# Patient Record
Sex: Female | Born: 1965 | Race: White | Hispanic: No | Marital: Single | Smoking: Former smoker
Health system: Southern US, Community
[De-identification: ages and names within clinical notes are randomized; demographics above are authoritative.]

## PROBLEM LIST (undated history)

## (undated) DIAGNOSIS — G2401 Drug induced subacute dyskinesia: Secondary | ICD-10-CM

## (undated) DIAGNOSIS — R569 Unspecified convulsions: Secondary | ICD-10-CM

## (undated) DIAGNOSIS — F209 Schizophrenia, unspecified: Secondary | ICD-10-CM

## (undated) DIAGNOSIS — M810 Age-related osteoporosis without current pathological fracture: Secondary | ICD-10-CM

## (undated) DIAGNOSIS — F79 Unspecified intellectual disabilities: Secondary | ICD-10-CM

## (undated) DIAGNOSIS — N189 Chronic kidney disease, unspecified: Secondary | ICD-10-CM

## (undated) DIAGNOSIS — W19XXXA Unspecified fall, initial encounter: Secondary | ICD-10-CM

## (undated) DIAGNOSIS — R011 Cardiac murmur, unspecified: Secondary | ICD-10-CM

## (undated) DIAGNOSIS — D649 Anemia, unspecified: Secondary | ICD-10-CM

## (undated) HISTORY — DX: Anemia, unspecified: D64.9

## (undated) HISTORY — DX: Chronic kidney disease, unspecified: N18.9

## (undated) HISTORY — DX: Age-related osteoporosis without current pathological fracture: M81.0

---

## 1997-09-14 ENCOUNTER — Other Ambulatory Visit: Admission: RE | Admit: 1997-09-14 | Discharge: 1997-09-14 | Payer: Self-pay | Admitting: Obstetrics and Gynecology

## 1997-10-27 ENCOUNTER — Emergency Department (HOSPITAL_COMMUNITY): Admission: EM | Admit: 1997-10-27 | Discharge: 1997-10-27 | Payer: Self-pay | Admitting: Emergency Medicine

## 1997-11-01 ENCOUNTER — Ambulatory Visit (HOSPITAL_COMMUNITY): Admission: RE | Admit: 1997-11-01 | Discharge: 1997-11-01 | Payer: Self-pay | Admitting: Obstetrics and Gynecology

## 1997-12-14 ENCOUNTER — Other Ambulatory Visit: Admission: RE | Admit: 1997-12-14 | Discharge: 1997-12-14 | Payer: Self-pay | Admitting: Obstetrics and Gynecology

## 1998-03-29 ENCOUNTER — Other Ambulatory Visit: Admission: RE | Admit: 1998-03-29 | Discharge: 1998-03-29 | Payer: Self-pay | Admitting: Obstetrics and Gynecology

## 1998-05-17 ENCOUNTER — Emergency Department (HOSPITAL_COMMUNITY): Admission: EM | Admit: 1998-05-17 | Discharge: 1998-05-17 | Payer: Self-pay | Admitting: *Deleted

## 1998-07-17 ENCOUNTER — Other Ambulatory Visit: Admission: RE | Admit: 1998-07-17 | Discharge: 1998-07-17 | Payer: Self-pay | Admitting: Obstetrics and Gynecology

## 1998-10-15 ENCOUNTER — Other Ambulatory Visit: Admission: RE | Admit: 1998-10-15 | Discharge: 1998-10-15 | Payer: Self-pay | Admitting: Obstetrics and Gynecology

## 1999-05-14 ENCOUNTER — Other Ambulatory Visit: Admission: RE | Admit: 1999-05-14 | Discharge: 1999-05-14 | Payer: Self-pay | Admitting: Internal Medicine

## 2000-06-17 ENCOUNTER — Other Ambulatory Visit: Admission: RE | Admit: 2000-06-17 | Discharge: 2000-06-17 | Payer: Self-pay | Admitting: Family Medicine

## 2000-06-28 ENCOUNTER — Ambulatory Visit (HOSPITAL_COMMUNITY): Admission: RE | Admit: 2000-06-28 | Discharge: 2000-06-28 | Payer: Self-pay | Admitting: *Deleted

## 2001-09-15 ENCOUNTER — Encounter: Admission: RE | Admit: 2001-09-15 | Discharge: 2001-09-15 | Payer: Self-pay | Admitting: Family Medicine

## 2001-09-15 ENCOUNTER — Encounter: Payer: Self-pay | Admitting: Family Medicine

## 2002-07-14 ENCOUNTER — Encounter: Payer: Self-pay | Admitting: Family Medicine

## 2002-07-14 ENCOUNTER — Encounter: Admission: RE | Admit: 2002-07-14 | Discharge: 2002-07-14 | Payer: Self-pay | Admitting: Family Medicine

## 2002-10-05 ENCOUNTER — Encounter: Admission: RE | Admit: 2002-10-05 | Discharge: 2002-10-05 | Payer: Self-pay | Admitting: Family Medicine

## 2002-10-05 ENCOUNTER — Encounter: Payer: Self-pay | Admitting: Family Medicine

## 2002-11-28 ENCOUNTER — Emergency Department (HOSPITAL_COMMUNITY): Admission: EM | Admit: 2002-11-28 | Discharge: 2002-11-28 | Payer: Self-pay | Admitting: Emergency Medicine

## 2002-12-06 ENCOUNTER — Ambulatory Visit (HOSPITAL_COMMUNITY): Admission: RE | Admit: 2002-12-06 | Discharge: 2002-12-06 | Payer: Self-pay | Admitting: Oncology

## 2002-12-06 ENCOUNTER — Encounter: Payer: Self-pay | Admitting: Oncology

## 2002-12-29 ENCOUNTER — Ambulatory Visit (HOSPITAL_COMMUNITY): Admission: RE | Admit: 2002-12-29 | Discharge: 2002-12-29 | Payer: Self-pay | Admitting: Oncology

## 2002-12-29 ENCOUNTER — Encounter: Payer: Self-pay | Admitting: Oncology

## 2003-01-04 ENCOUNTER — Encounter (INDEPENDENT_AMBULATORY_CARE_PROVIDER_SITE_OTHER): Payer: Self-pay | Admitting: *Deleted

## 2003-01-04 ENCOUNTER — Ambulatory Visit (HOSPITAL_COMMUNITY): Admission: RE | Admit: 2003-01-04 | Discharge: 2003-01-04 | Payer: Self-pay | Admitting: Oncology

## 2003-03-05 ENCOUNTER — Ambulatory Visit (HOSPITAL_COMMUNITY): Admission: RE | Admit: 2003-03-05 | Discharge: 2003-03-05 | Payer: Self-pay | Admitting: Family Medicine

## 2003-03-05 ENCOUNTER — Encounter: Payer: Self-pay | Admitting: Family Medicine

## 2003-10-12 ENCOUNTER — Encounter: Admission: RE | Admit: 2003-10-12 | Discharge: 2003-10-12 | Payer: Self-pay | Admitting: Family Medicine

## 2004-11-05 ENCOUNTER — Emergency Department (HOSPITAL_COMMUNITY): Admission: EM | Admit: 2004-11-05 | Discharge: 2004-11-05 | Payer: Self-pay | Admitting: Emergency Medicine

## 2006-02-16 ENCOUNTER — Emergency Department (HOSPITAL_COMMUNITY): Admission: EM | Admit: 2006-02-16 | Discharge: 2006-02-16 | Payer: Self-pay | Admitting: Emergency Medicine

## 2006-03-16 ENCOUNTER — Emergency Department (HOSPITAL_COMMUNITY): Admission: EM | Admit: 2006-03-16 | Discharge: 2006-03-16 | Payer: Self-pay | Admitting: Emergency Medicine

## 2007-08-15 ENCOUNTER — Emergency Department (HOSPITAL_COMMUNITY): Admission: EM | Admit: 2007-08-15 | Discharge: 2007-08-15 | Payer: Self-pay | Admitting: Emergency Medicine

## 2008-02-14 ENCOUNTER — Emergency Department (HOSPITAL_COMMUNITY): Admission: EM | Admit: 2008-02-14 | Discharge: 2008-02-14 | Payer: Self-pay | Admitting: *Deleted

## 2008-02-14 ENCOUNTER — Emergency Department (HOSPITAL_COMMUNITY): Admission: EM | Admit: 2008-02-14 | Discharge: 2008-02-14 | Payer: Self-pay | Admitting: Emergency Medicine

## 2008-12-26 ENCOUNTER — Ambulatory Visit: Payer: Self-pay | Admitting: Oncology

## 2008-12-27 LAB — COMPREHENSIVE METABOLIC PANEL
AST: 20 U/L (ref 0–37)
Albumin: 3.4 g/dL — ABNORMAL LOW (ref 3.5–5.2)
BUN: 9 mg/dL (ref 6–23)
Calcium: 9.2 mg/dL (ref 8.4–10.5)
Chloride: 107 mEq/L (ref 96–112)
Glucose, Bld: 84 mg/dL (ref 70–99)
Potassium: 4.9 mEq/L (ref 3.5–5.3)
Sodium: 142 mEq/L (ref 135–145)
Total Protein: 5.4 g/dL — ABNORMAL LOW (ref 6.0–8.3)

## 2008-12-27 LAB — CBC WITH DIFFERENTIAL/PLATELET
Basophils Absolute: 0 10*3/uL (ref 0.0–0.1)
HCT: 32.5 % — ABNORMAL LOW (ref 34.8–46.6)
HGB: 10.8 g/dL — ABNORMAL LOW (ref 11.6–15.9)
MONO#: 0.8 10*3/uL (ref 0.1–0.9)
NEUT#: 3.1 10*3/uL (ref 1.5–6.5)
NEUT%: 53.5 % (ref 38.4–76.8)
RDW: 15.6 % — ABNORMAL HIGH (ref 11.2–14.5)
WBC: 5.7 10*3/uL (ref 3.9–10.3)
lymph#: 1.8 10*3/uL (ref 0.9–3.3)

## 2008-12-27 LAB — CHCC SMEAR

## 2008-12-28 LAB — ANA: Anti Nuclear Antibody(ANA): NEGATIVE

## 2009-04-23 ENCOUNTER — Ambulatory Visit: Payer: Self-pay | Admitting: Oncology

## 2009-04-25 LAB — CBC WITH DIFFERENTIAL/PLATELET
BASO%: 0.4 % (ref 0.0–2.0)
Basophils Absolute: 0 10*3/uL (ref 0.0–0.1)
Eosinophils Absolute: 0.1 10*3/uL (ref 0.0–0.5)
HCT: 36.5 % (ref 34.8–46.6)
HGB: 11.9 g/dL (ref 11.6–15.9)
MCHC: 32.6 g/dL (ref 31.5–36.0)
MCV: 101.1 fL — ABNORMAL HIGH (ref 79.5–101.0)
MONO#: 0.6 10*3/uL (ref 0.1–0.9)
NEUT#: 3.7 10*3/uL (ref 1.5–6.5)
NEUT%: 51.3 % (ref 38.4–76.8)
WBC: 7.1 10*3/uL (ref 3.9–10.3)

## 2009-04-29 LAB — SPEP & IFE WITH QIG
Albumin ELP: 56.7 % (ref 55.8–66.1)
Alpha-1-Globulin: 5.7 % — ABNORMAL HIGH (ref 2.9–4.9)
Alpha-2-Globulin: 11.9 % — ABNORMAL HIGH (ref 7.1–11.8)
Total Protein, Serum Electrophoresis: 6.3 g/dL (ref 6.0–8.3)

## 2009-04-29 LAB — COMPREHENSIVE METABOLIC PANEL
AST: 22 U/L (ref 0–37)
BUN: 24 mg/dL — ABNORMAL HIGH (ref 6–23)
Calcium: 8.9 mg/dL (ref 8.4–10.5)
Chloride: 104 mEq/L (ref 96–112)
Creatinine, Ser: 1.12 mg/dL (ref 0.40–1.20)
Glucose, Bld: 81 mg/dL (ref 70–99)

## 2009-04-29 LAB — IRON AND TIBC
%SAT: 29 % (ref 20–55)
TIBC: 313 ug/dL (ref 250–470)
UIBC: 221 ug/dL

## 2009-04-29 LAB — VITAMIN B12: Vitamin B-12: 616 pg/mL (ref 211–911)

## 2009-10-22 ENCOUNTER — Ambulatory Visit: Payer: Self-pay | Admitting: Oncology

## 2010-10-10 NOTE — Op Note (Signed)
   NAME:  Diamond Bowman, Diamond Bowman                         ACCOUNT NO.:  [id number]   MEDICAL RECORD NO.:  [id number]                   PATIENT TYPE:  AMB   LOCATION:  OMED                                 FACILITY:  [facility]   PHYSICIAN:  Pierce Crane, M.D.                   DATE OF BIRTH:  05-17-1966   DATE OF PROCEDURE:  01/04/2003  DATE OF DISCHARGE:                                 OPERATIVE REPORT   PROCEDURE PERFORMED:  Bone marrow biopsy and aspirate.   PROCEDURE:  Ms. Massing was brought to the outpatient day area.  She was  placed in the prone position.  She was given a total of 7 mg of Versed.  She  had 2% Xylocaine used for local anesthesia.  A bone marrow aspirate and  biopsy was performed from the left anterior-superior iliac spine.  The  patient tolerated the procedure well.  The patient will be monitored in the  outpatient clinic and discharged when stable.  The vital signs were  monitored throughout.                                               Pierce Crane, M.D.    PR/MEDQ  D:  01/04/2003  T:  01/04/2003  Job:  [id number]

## 2011-02-23 LAB — CBC
HCT: 33.2 — ABNORMAL LOW
HCT: 36.3
Hemoglobin: 12.3
MCHC: 33.8
MCV: 96.9
MCV: 97.3
Platelets: 70 — ABNORMAL LOW
RBC: 3.73 — ABNORMAL LOW
RDW: 13.7

## 2011-02-23 LAB — URINALYSIS, ROUTINE W REFLEX MICROSCOPIC
Bilirubin Urine: NEGATIVE
Hgb urine dipstick: NEGATIVE
Ketones, ur: NEGATIVE
Nitrite: NEGATIVE
Specific Gravity, Urine: 1.025
pH: 6.5

## 2011-02-23 LAB — DIFFERENTIAL
Basophils Absolute: 0
Basophils Relative: 2 — ABNORMAL HIGH
Lymphocytes Relative: 44
Lymphs Abs: 1.7
Monocytes Absolute: 0.3
Monocytes Absolute: 0.5
Monocytes Relative: 12
Monocytes Relative: 8
Neutro Abs: 1.7
Neutro Abs: 2.4

## 2011-02-23 LAB — URINE MICROSCOPIC-ADD ON

## 2011-02-23 LAB — COMPREHENSIVE METABOLIC PANEL
Albumin: 2.7 — ABNORMAL LOW
BUN: 17
Calcium: 8.4
Creatinine, Ser: 0.9
GFR calc Af Amer: >60
Total Protein: 4.9 — ABNORMAL LOW

## 2011-02-23 LAB — RAPID URINE DRUG SCREEN, HOSP PERFORMED
Amphetamines: NOT DETECTED
Barbiturates: NOT DETECTED
Cocaine: NOT DETECTED
Opiates: NOT DETECTED
Tetrahydrocannabinol: NOT DETECTED

## 2012-05-14 ENCOUNTER — Observation Stay (HOSPITAL_COMMUNITY)
Admission: EM | Admit: 2012-05-14 | Discharge: 2012-05-15 | Disposition: A | Discharge status: Home / Self Care | Payer: Medicaid Other | Attending: Family Medicine | Admitting: Family Medicine

## 2012-05-14 ENCOUNTER — Emergency Department (HOSPITAL_COMMUNITY): Payer: Medicaid Other

## 2012-05-14 ENCOUNTER — Encounter (HOSPITAL_COMMUNITY): Payer: Self-pay | Admitting: Nurse Practitioner

## 2012-05-14 DIAGNOSIS — F3181 Bipolar II disorder: Secondary | ICD-10-CM

## 2012-05-14 DIAGNOSIS — W19XXXA Unspecified fall, initial encounter: Secondary | ICD-10-CM

## 2012-05-14 DIAGNOSIS — T65891A Toxic effect of other specified substances, accidental (unintentional), initial encounter: Secondary | ICD-10-CM

## 2012-05-14 DIAGNOSIS — F3189 Other bipolar disorder: Secondary | ICD-10-CM | POA: Insufficient documentation

## 2012-05-14 DIAGNOSIS — R6250 Unspecified lack of expected normal physiological development in childhood: Secondary | ICD-10-CM | POA: Insufficient documentation

## 2012-05-14 DIAGNOSIS — F79 Unspecified intellectual disabilities: Secondary | ICD-10-CM

## 2012-05-14 DIAGNOSIS — R569 Unspecified convulsions: Principal | ICD-10-CM

## 2012-05-14 DIAGNOSIS — E039 Hypothyroidism, unspecified: Secondary | ICD-10-CM

## 2012-05-14 DIAGNOSIS — G40909 Epilepsy, unspecified, not intractable, without status epilepticus: Secondary | ICD-10-CM

## 2012-05-14 DIAGNOSIS — Z79899 Other long term (current) drug therapy: Secondary | ICD-10-CM | POA: Insufficient documentation

## 2012-05-14 DIAGNOSIS — N183 Chronic kidney disease, stage 3 unspecified: Secondary | ICD-10-CM | POA: Insufficient documentation

## 2012-05-14 DIAGNOSIS — T56894A Toxic effect of other metals, undetermined, initial encounter: Secondary | ICD-10-CM

## 2012-05-14 DIAGNOSIS — M810 Age-related osteoporosis without current pathological fracture: Secondary | ICD-10-CM

## 2012-05-14 DIAGNOSIS — T56891A Toxic effect of other metals, accidental (unintentional), initial encounter: Secondary | ICD-10-CM

## 2012-05-14 DIAGNOSIS — Z9181 History of falling: Secondary | ICD-10-CM | POA: Insufficient documentation

## 2012-05-14 HISTORY — DX: Unspecified intellectual disabilities: F79

## 2012-05-14 HISTORY — DX: Hypothyroidism, unspecified: E03.9

## 2012-05-14 HISTORY — DX: Unspecified fall, initial encounter: W19.XXXA

## 2012-05-14 HISTORY — DX: Schizophrenia, unspecified: F20.9

## 2012-05-14 HISTORY — DX: Bipolar II disorder: F31.81

## 2012-05-14 LAB — CBC WITH DIFFERENTIAL/PLATELET
Basophils Relative: 0 % (ref 0–1)
Eosinophils Absolute: 0.1 10*3/uL (ref 0.0–0.7)
HCT: 32.3 % — ABNORMAL LOW (ref 36.0–46.0)
Hemoglobin: 10.5 g/dL — ABNORMAL LOW (ref 12.0–15.0)
MCH: 33.1 pg (ref 26.0–34.0)
MCHC: 32.5 g/dL (ref 30.0–36.0)
Monocytes Absolute: 0.8 10*3/uL (ref 0.1–1.0)
Monocytes Relative: 8 % (ref 3–12)
Neutrophils Relative %: 74 % (ref 43–77)

## 2012-05-14 LAB — BASIC METABOLIC PANEL
BUN: 19 mg/dL (ref 6–23)
Creatinine, Ser: 1.29 mg/dL — ABNORMAL HIGH (ref 0.50–1.10)
GFR calc Af Amer: 56 mL/min — ABNORMAL LOW (ref >90)
GFR calc non Af Amer: 48 mL/min — ABNORMAL LOW (ref >90)

## 2012-05-14 MED ORDER — HYDROXYZINE HCL 25 MG PO TABS: 50.0000 mg | ORAL_TABLET | Freq: Three times a day (TID) | ORAL | Status: DC | PRN

## 2012-05-14 MED ORDER — DIVALPROEX SODIUM ER 500 MG PO TB24
1000.0000 mg | ORAL_TABLET | Freq: Once | ORAL | Status: AC
  Administered 2012-05-15: 1000 mg via ORAL
  Filled 2012-05-14 (×2): qty 2

## 2012-05-14 MED ORDER — SODIUM CHLORIDE 0.9 % IV BOLUS (SEPSIS)
2000.0000 mL | Freq: Once | INTRAVENOUS | Status: AC
  Administered 2012-05-14: 2000 mL via INTRAVENOUS

## 2012-05-14 MED ORDER — LORAZEPAM 1 MG PO TABS
1.0000 mg | ORAL_TABLET | Freq: Once | ORAL | Status: AC
  Administered 2012-05-14: 1 mg via ORAL
  Filled 2012-05-14: qty 1

## 2012-05-14 MED ORDER — LORAZEPAM 2 MG/ML IJ SOLN: 1.0000 mg | Freq: Once | INTRAMUSCULAR | Status: DC

## 2012-05-14 MED ORDER — DIVALPROEX SODIUM ER 500 MG PO TB24
1500.0000 mg | ORAL_TABLET | Freq: Every day | ORAL | Status: DC
  Filled 2012-05-14: qty 3

## 2012-05-14 MED ORDER — DESMOPRESSIN ACETATE 0.1 MG PO TABS
0.1000 mg | ORAL_TABLET | Freq: Two times a day (BID) | ORAL | Status: DC
  Administered 2012-05-14 – 2012-05-15 (×2): 0.1 mg via ORAL
  Filled 2012-05-14 (×3): qty 1

## 2012-05-14 MED ORDER — VALPROATE SODIUM 500 MG/5ML IV SOLN
1000.0000 mg | Freq: Once | INTRAVENOUS | Status: AC
  Administered 2012-05-14: 1000 mg via INTRAVENOUS
  Filled 2012-05-14: qty 10

## 2012-05-14 MED ORDER — ILOPERIDONE 12 MG PO TABS
24.0000 mg | ORAL_TABLET | Freq: Every day | ORAL | Status: DC
  Administered 2012-05-14 – 2012-05-15 (×2): 24 mg via ORAL
  Filled 2012-05-14 (×3): qty 2

## 2012-05-14 MED ORDER — HEPARIN SODIUM (PORCINE) 5000 UNIT/ML IJ SOLN
5000.0000 [IU] | Freq: Three times a day (TID) | INTRAMUSCULAR | Status: DC
  Filled 2012-05-14 (×6): qty 1

## 2012-05-14 MED ORDER — LEVOTHYROXINE SODIUM 50 MCG PO TABS
50.0000 ug | ORAL_TABLET | Freq: Every day | ORAL | Status: DC
  Administered 2012-05-15: 50 ug via ORAL
  Filled 2012-05-14 (×5): qty 1

## 2012-05-14 MED ORDER — LORAZEPAM 2 MG/ML IJ SOLN: 1.0000 mg | INTRAMUSCULAR | Status: DC | PRN

## 2012-05-14 MED ORDER — SODIUM CHLORIDE 0.9 % IV SOLN
INTRAVENOUS | Status: DC
  Administered 2012-05-14: 18:00:00 via INTRAVENOUS
  Filled 2012-05-14: qty 1000

## 2012-05-14 MED ORDER — FERROUS SULFATE 325 (65 FE) MG PO TABS
325.0000 mg | ORAL_TABLET | Freq: Every day | ORAL | Status: DC
  Administered 2012-05-15: 325 mg via ORAL
  Filled 2012-05-14 (×2): qty 1

## 2012-05-14 MED ORDER — TRAZODONE HCL 100 MG PO TABS
100.0000 mg | ORAL_TABLET | Freq: Every day | ORAL | Status: DC
  Administered 2012-05-14: 100 mg via ORAL
  Filled 2012-05-14 (×2): qty 1

## 2012-05-14 NOTE — ED Notes (Signed)
Diamond Bowman from lab called lithium level of 3.02, reported to MD

## 2012-05-14 NOTE — Consult Note (Signed)
Diamond Bowman 05/14/2012 Theophile Harvie D Requesting Physician:  Dr. Mahala Menghini  Reason for Consult:  Lithium toxicity HPI: The patient is a 46 y.o. year-old with hx of mental retardation, chronic schizophrenia and seizure disorder brought to hospital today by caregiver after a fall at home. Caregiver states her balance was off, and she may have had a seizure as well. Brought to ED where she was seen by neurology and hospitalist and given po Ativan. A lithium level returned high at 3.0.   Patient cared for by caregiver at home. At baseline she has tremor, and blinks her eyes and asks for food "all the time" per caregiver. She normally walks, feeds and dresses herself. She can comb her hair, but cannot "do"her hair.    ROS  not responding  no sob or cp  no abd pain   Past Medical History:  Past Medical History  Diagnosis Date  . Mental retardation   . Schizophrenia   . Fall     Past Surgical History: History reviewed. No pertinent past surgical history.  Family History:  Family History  Problem Relation Age of Onset  . Osteoarthritis Mother   . Diabetes Father    Social History:  does not have a smoking history on file. She does not have any smokeless tobacco history on file. Her alcohol and drug histories not on file.  Allergies: No Known Allergies  Home medications: Prior to Admission medications   Medication Sig Start Date End Date Taking? Authorizing Provider  alendronate (FOSAMAX) 70 MG tablet Take 70 mg by mouth every 7 (seven) days. Take with a full glass of water on an empty stomach every Thursday.   Yes Historical Provider, MD  Calcium Carbonate-Vitamin D (OSCAL 500/200 D-3 PO) Take 1 tablet by mouth daily.   Yes Historical Provider, MD  desmopressin (DDAVP) 0.1 MG tablet Take 0.1 mg by mouth 2 (two) times daily.   Yes Historical Provider, MD  divalproex (DEPAKOTE) 500 MG DR tablet Take 500 mg by mouth 3 (three) times daily. Take 1500 mg by mouth at bedtime.   Yes  Historical Provider, MD  docusate sodium (COLACE) 100 MG capsule Take 100 mg by mouth daily.   Yes Historical Provider, MD  Emollient (EUCERIN) lotion Apply topically 2 (two) times daily.   Yes Historical Provider, MD  Ferrous Sulfate 143 (45 FE) MG TBCR Take 1 tablet by mouth daily.   Yes Historical Provider, MD  hydrOXYzine (ATARAX/VISTARIL) 50 MG tablet Take 50 mg by mouth 3 (three) times daily as needed.   Yes Historical Provider, MD  Iloperidone (FANAPT) 12 MG TABS Take 24 mg by mouth daily.   Yes Historical Provider, MD  levothyroxine (SYNTHROID, LEVOTHROID) 50 MCG tablet Take 50 mcg by mouth daily.   Yes Historical Provider, MD  lithium 300 MG tablet Take 300 mg by mouth 2 (two) times daily.   Yes Historical Provider, MD  loratadine (LORADAMED) 10 MG tablet Take 10 mg by mouth daily.   Yes Historical Provider, MD  Multiple Vitamins-Minerals (THERATRUM COMPLETE PO) Take 1 tablet by mouth daily.   Yes Historical Provider, MD  traZODone (DESYREL) 100 MG tablet Take 100 mg by mouth at bedtime. Take 300 mg by mouth at bedtime.   Yes Historical Provider, MD    Inpatient medications:    . desmopressin  0.1 mg Oral BID  . divalproex  1,000 mg Oral Once  . divalproex  1,500 mg Oral QHS  . ferrous sulfate  325 mg Oral Daily  .  heparin  5,000 Units Subcutaneous Q8H  . Iloperidone  24 mg Oral Daily  . levothyroxine  50 mcg Oral QAC breakfast  . traZODone  100 mg Oral QHS  . valproate sodium  1,000 mg Intravenous Once    Labs: Basic Metabolic Panel:  Lab 05/14/12 1610  NA 137  K 4.7  CL 102  CO2 30  GLUCOSE 77  BUN 19  CREATININE 1.29*  ALB --  CALCIUM 8.9  PHOS --   Liver Function Tests: No results found for this basename: AST:3,ALT:3,ALKPHOS:3,BILITOT:3,PROT:3,ALBUMIN:3 in the last 168 hours No results found for this basename: LIPASE:3,AMYLASE:3 in the last 168 hours No results found for this basename: AMMONIA:3 in the last 168 hours CBC:  Lab 05/14/12 1249  WBC 10.1   NEUTROABS 7.5  HGB 10.5*  HCT 32.3*  MCV 101.9*  PLT 63*   PT/INR: @labrcntip (inr:5) Cardiac Enzymes: No results found for this basename: CKTOTAL:5,CKMB:5,CKMBINDEX:5,TROPONINI:5 in the last 168 hours CBG: No results found for this basename: GLUCAP:5 in the last 168 hours  Iron Studies: No results found for this basename: IRON:30,TIBC:30,TRANSFERRIN:30,FERRITIN:30 in the last 168 hours  Xrays/Other Studies: Ct Head Wo Contrast  05/14/2012  *RADIOLOGY REPORT*  Clinical Data: Rule out CVA  CT HEAD WITHOUT CONTRAST  Technique:  Contiguous axial images were obtained from the base of the skull through the vertex without contrast.  Comparison: CT 05/21/2006  Findings: Ventricle size is normal.  Negative for acute infarct. Negative hemorrhage or mass.  The skull is diffusely thickened.  Sinusitis with air-fluid levels in both maxillary sinuses.  IMPRESSION: No acute intracranial abnormality.  Sinusitis with air-fluid levels.   Original Report Authenticated By: Janeece Riggers, M.D.     Physical Exam:  Blood pressure 132/105, pulse 83, temperature 98.1 F (36.7 C), temperature source Oral, resp. rate 18, height 4\' 9"  (1.448 m), weight 56.79 kg (125 lb 3.2 oz), SpO2 95.00%.  Gen: small framed middle aged woman, disheveled, tremulous, responds verbally, no myoclonus or rigidity, PERRL  Skin: no rash, cyanosis HEENT:  EOMI, sclera anicteric, throat clear Neck: no JVD, no LAN Chest: clear bilat CV: regular, no rub or gallop, no murmur, no carotid or femoral bruits, pedal pulses intact Abdomen: soft, nontender, +BS Ext: no LE edema no UE edema, no joint effusion or deformity, no gangrene or ulceration Neuro: alert, Ox3, no focal deficit, gen'd weakness, follows most commands   Impression/Plan 1. Lilthium toxicity- level is 3.0; HD is optional in this range based on symptoms. She is back very near to baseline now, so will hold on dialysis for now.  I think getting HD access in and doing HD would  be quite difficult with pts behavior and agitation right now. Agree with NS at 150 cc/hr, f/u Li levels 2. Seizure x 1, possible, in pt with hx of seizure d/o 3. Mental retardation 4. Chronic schizophrenia   Diamond Moselle  MD [facility] [phone] pgr    [phone] cell 05/14/2012, 5:57 PM

## 2012-05-14 NOTE — H&P (Signed)
[facility] History and Physical  LEGACY CARRENDER WUJ:[id number] DOB: 1965-06-17 DOA: 05/14/2012  Referring physician: Arthor Captain, Dola Factor PCP: No primary provider on file.  Specialists: Neurology  Chief Complaint: Seizure  HPI: Diamond Bowman is a 46 y.o. female who presented t [facility] with h/o fall about 11:00 am.  At the time she was in her bedroom.  THis fall was unwitnessed.  PAtient;s gaurdian's daugher heard a noise, .  The daughter wentinto the room, and tried to get her up.  It seemed like she was walking off balanced andher speech seemed slurred.  Her speech at time sis slurred anyway.  Today her speech was worse.  There doesn;t appear to have been any loss of control of bowel or bladder She has a h/o Mental Retardation which is allegedly mild and has a h/o schizphrenia. This has never happened before. The last sz in dec 2005 was differnet in that she had rolling eyeballs, and she had a generalized process> than this episdoe.  She has been taking all of her meds-she has not refused them.    She has been living with her current caregiver assistance 2005 and her mother is peripherally involved in her care and makes medical decisions for his health care power of attorney.   Review of Systems: The patient cannot give me an accurate review of systems given her significant mental retardation and fixates on want to eat. When I tried orient her and ask her where she is she knows that she is in presence of healthcare providers, but cannot tell me location, [place], city-she had also received a dose of Ativan in the emergency room and seems to become more sleepy.  Past Medical History  Diagnosis Date  . Mental retardation    History reviewed. No pertinent past surgical history. Social History:  does not have a smoking history on file. She does not have any smokeless tobacco history on file. Her alcohol and drug histories not on file.   No Known Allergies  Family History   Problem Relation Age of Onset  . Osteoarthritis Mother   . Diabetes Father    Prior to Admission medications   Medication Sig Start Date End Date Taking? Authorizing Provider  alendronate (FOSAMAX) 70 MG tablet Take 70 mg by mouth every 7 (seven) days. Take with a full glass of water on an empty stomach every Thursday.   Yes Historical Provider, MD  Calcium Carbonate-Vitamin D (OSCAL 500/200 D-3 PO) Take 1 tablet by mouth daily.   Yes Historical Provider, MD  desmopressin (DDAVP) 0.1 MG tablet Take 0.1 mg by mouth 2 (two) times daily.   Yes Historical Provider, MD  divalproex (DEPAKOTE) 500 MG DR tablet Take 500 mg by mouth 3 (three) times daily. Take 1500 mg by mouth at bedtime.   Yes Historical Provider, MD  docusate sodium (COLACE) 100 MG capsule Take 100 mg by mouth daily.   Yes Historical Provider, MD  Emollient (EUCERIN) lotion Apply topically 2 (two) times daily.   Yes Historical Provider, MD  Ferrous Sulfate 143 (45 FE) MG TBCR Take 1 tablet by mouth daily.   Yes Historical Provider, MD  hydrOXYzine (ATARAX/VISTARIL) 50 MG tablet Take 50 mg by mouth 3 (three) times daily as needed.   Yes Historical Provider, MD  Iloperidone (FANAPT) 12 MG TABS Take 24 mg by mouth daily.   Yes Historical Provider, MD  levothyroxine (SYNTHROID, LEVOTHROID) 50 MCG tablet Take 50 mcg by mouth daily.   Yes  Historical Provider, MD  lithium 300 MG tablet Take 300 mg by mouth 2 (two) times daily.   Yes Historical Provider, MD  loratadine (LORADAMED) 10 MG tablet Take 10 mg by mouth daily.   Yes Historical Provider, MD  Multiple Vitamins-Minerals (THERATRUM COMPLETE PO) Take 1 tablet by mouth daily.   Yes Historical Provider, MD  traZODone (DESYREL) 100 MG tablet Take 100 mg by mouth at bedtime. Take 300 mg by mouth at bedtime.   Yes Historical Provider, MD   Physical Exam: Filed Vitals:   05/14/12 1200  BP: 141/67  Pulse: 82  Temp: 97.9 F (36.6 C)  TempSrc: Oral  Resp: 18  SpO2: 95%     General:   Somewhat sleepy but arousable. He cannot give meaningful history and fixates on wanting to eat  Eyes: Bilaterally equal size with the right slightly larger than left 4 mm versus 3 mm. Funduscopy deferred. Not able to assess vision by direct confrontation  ENT: Moderate dentition  Neck: Soft supple  Cardiovascular: S1-S2 no murmur rub or gallop  Respiratory: Clinically clear  Abdomen: Soft nontender nondistended  Skin: Had a lower extremity edema  Musculoskeletal: Range of motion seems intact  Psychiatric: Flat affect with fixation and certain things.  Neurologic: Patient has bilateral bulk E. quality of major muscle groups, she withdraws both knees tremendous as well as blinks to Menest. She is able to conduct visual exam her confrontation and cannot test for past-pointing. She is hyperreflexic with 2/33/3 reflexes with one beat of clonus at the ankle on dorsiflexion. Am not able to appreciate any nystagmus  Labs on Admission:  Basic Metabolic Panel:  Lab 05/14/12 8119  NA 137  K 4.7  CL 102  CO2 30  GLUCOSE 77  BUN 19  CREATININE 1.29*  CALCIUM 8.9  MG --  PHOS --   Liver Function Tests: No results found for this basename: AST:5,ALT:5,ALKPHOS:5,BILITOT:5,PROT:5,ALBUMIN:5 in the last 168 hours No results found for this basename: LIPASE:5,AMYLASE:5 in the last 168 hours No results found for this basename: AMMONIA:5 in the last 168 hours CBC:  Lab 05/14/12 1249  WBC 10.1  NEUTROABS 7.5  HGB 10.5*  HCT 32.3*  MCV 101.9*  PLT 63*   Cardiac Enzymes: No results found for this basename: CKTOTAL:5,CKMB:5,CKMBINDEX:5,TROPONINI:5 in the last 168 hours  BNP (last 3 results) No results found for this basename: PROBNP:3 in the last 8760 hours CBG: No results found for this basename: GLUCAP:5 in the last 168 hours  Radiological Exams on Admission: No results found.  EKG: Independently reviewed. None performed  Assessment/Plan Principal Problem:  *Seizure Active  Problems:  Mental retardation  Bipolar 2 disorder  Fall  Hypothyroid  Osteoporosis   1. Likely seizure-given description, patient likely had a petit mal seizure versus a small stroke. Am less inclined to think this stroke given she is nonfocal and seems to be at her normal baseline. I have consulted neurology as she is currently on Depakote 1500 each bedtime. She was given lorazepam 1 mg in the emergency room.-Appreciate neurology input 2. Supratherapeutic lithium-probably secondary to chronic congestion with recent illness/upper respiratory illness-her kidney function seems normal size not sure how to explain this otherwise-she will need copious hydration with IV fluids and we will consult nephrology in case her lithium level rises-she may benefit from dialysis 3. Hypothyroidism-get a TSH 4. Bipolar disorder-continue lloperidone 24 mg, increased Depakote for #1 as per above.  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  Give IVF 6. ?Viral illness-will get CXR to ensure no PNA  Discussed case c Dr. Thad Ranger, Neurology who graciously will see patient Will call nephrology to discuss needs for possible dialysis  Code Status: Full  Family Communication: Spoke c Caregiver in detail-will update mother later  Disposition Plan: Obs, Tele  Time spent: 14  Mahala Menghini [facility] Pager [phone]  If 7PM-7AM, please contact night-coverage [email or web address] Password [facility] 05/14/2012, 2:54 PM

## 2012-05-14 NOTE — ED Provider Notes (Signed)
History     CSN: [id number]  Arrival date & time 05/14/12  1153   First MD Initiated Contact with Patient 05/14/12 1156      Chief Complaint  Patient presents with  . Fall    (Consider location/radiation/quality/duration/timing/severity/associated sxs/prior treatment) HPI 46 year old female with a past medical history of mental retardation, seizures, and schizophrenia presents today with chief complaint of unwitnessed fall.  Her caretakers are here with her.  They state that she fell in her room when they arrived patient was unable to respond to questions.  She was not walking normally.  This lasted for approximately 10 minutes until EMS arrived.  Patient then began to speak and interact.  She has been taking her medications as directed.  Her last documented seizure was in 2005.  Her caretakers think that she may have had a seizure today.  She has had an upper respiratory infection of the past week.  She has not been running fevers.  Per caretakers, she has chronic tremors and AVH.  Past Medical History  Diagnosis Date  . Mental retardation     History reviewed. No pertinent past surgical history.  History reviewed. No pertinent family history.  History  Substance Use Topics  . Smoking status: Not on file  . Smokeless tobacco: Not on file  . Alcohol Use:     OB History    Grav Para Term Preterm Abortions TAB SAB Ect Mult Living                  Review of Systems  Unable to perform ROS: Psychiatric disorder    Allergies  Review of patient's allergies indicates no known allergies.  Home Medications   Current Outpatient Rx  Name  Route  Sig  Dispense  Refill  . ALENDRONATE SODIUM 70 MG PO TABS   Oral   Take 70 mg by mouth every 7 (seven) days. Take with a full glass of water on an empty stomach every Thursday.         Ruthell Rummage 500/200 D-3 PO   Oral   Take 1 tablet by mouth daily.         . DESMOPRESSIN ACETATE 0.1 MG PO TABS   Oral   Take 0.1 mg by mouth  2 (two) times daily.         Marland Kitchen DIVALPROEX SODIUM 500 MG PO TBEC   Oral   Take 500 mg by mouth 3 (three) times daily. Take 1500 mg by mouth at bedtime.         Marland Kitchen DOCUSATE SODIUM 100 MG PO CAPS   Oral   Take 100 mg by mouth daily.         Jenetta Downer EX LOTN   Topical   Apply topically 2 (two) times daily.         Marland Kitchen FERROUS SULFATE ER 143 (45 FE) MG PO TBCR   Oral   Take 1 tablet by mouth daily.         Marland Kitchen HYDROXYZINE HCL 50 MG PO TABS   Oral   Take 50 mg by mouth 3 (three) times daily as needed.         . ILOPERIDONE 12 MG PO TABS   Oral   Take 24 mg by mouth daily.         Marland Kitchen LEVOTHYROXINE SODIUM 50 MCG PO TABS   Oral   Take 50 mcg by mouth daily.         Marland Kitchen LITHIUM  CARBONATE 300 MG PO TABS   Oral   Take 300 mg by mouth 2 (two) times daily.         Marland Kitchen LORATADINE 10 MG PO TABS   Oral   Take 10 mg by mouth daily.         Angelique Holm COMPLETE PO   Oral   Take 1 tablet by mouth daily.         . TRAZODONE HCL 100 MG PO TABS   Oral   Take 100 mg by mouth at bedtime. Take 300 mg by mouth at bedtime.           BP 141/67  Pulse 82  Temp 97.9 F (36.6 C) (Oral)  Resp 18  SpO2 95%  Physical Exam  Nursing note and vitals reviewed. Constitutional: She appears well-developed and well-nourished. No distress.  HENT:  Head: Normocephalic.  Eyes:       Patient has some nystagmus > 3 beats bL in both diractions    ED Course  Procedures (including critical care time)  Labs Reviewed  CBC WITH DIFFERENTIAL - Abnormal; Notable for the following:    RBC 3.17 (*)     Hemoglobin 10.5 (*)     HCT 32.3 (*)     MCV 101.9 (*)     RDW 15.7 (*)     Platelets 63 (*)     All other components within normal limits  BASIC METABOLIC PANEL - Abnormal; Notable for the following:    Creatinine, Ser 1.29 (*)     GFR calc non Af Amer 48 (*)     GFR calc Af Amer 56 (*)     All other components within normal limits  LITHIUM LEVEL - Abnormal; Notable for the  following:    Lithium Lvl 3.02 (*)     All other components within normal limits  VALPROIC ACID LEVEL  URINALYSIS, ROUTINE W REFLEX MICROSCOPIC   No results found.   1. Lithium toxicity       MDM   Filed Vitals:   05/14/12 1200  BP: 141/67  Pulse: 82  Temp: 97.9 F (36.6 C)  TempSrc: Oral  Resp: 18  SpO2: 95%   Patient with MR, AVH, Tremors and hx seizures. Her labs show lithium Toxicity, but neurological baseline is difficult to obtain.  I have spoken with Dr. Mahala Menghini who has agreed to admit the patient.  Dr. Thad Ranger will consult from Neuro.  Dr. Mahala Menghini has contacted Nephrology for consult as well.   The patient appears reasonably stabilized for admission considering the current resources, flow, and capabilities available in the ED at this time, and I doubt any other [facility] requiring further screening and/or treatment in the ED prior to admission.       Arthor Captain, PA-C 05/14/12 1554

## 2012-05-14 NOTE — ED Notes (Signed)
Pt with HX of MR, her with ems were with appointed guardians sts they heard her fall but was unwitnessed, they got her off, no signs of trauma, pt does have hx of seizure but not post ictal activity noted per ems

## 2012-05-14 NOTE — ED Provider Notes (Signed)
Medical screening examination/treatment/procedure(s) were conducted as a shared visit with non-physician practitioner(s) and myself.  I personally evaluated the patient during the encounter Patient with multiple medical problems including schizophrenia, MR, hallucinations he came in after a fall from a possible seizure and found to have a lithium overdose. Due to difficulty with examunclearifsheneedstohavedialysis.Patienthasnormalrenalfunction.Willadmitforfurthercare  Gwyneth Sprout, MD 05/14/12 1556

## 2012-05-14 NOTE — Consult Note (Signed)
Reason for Consult:Seizure, Lithium toxicity Referring Physician: Samtani  CC: Seizure  HPI: Diamond Bowman is an 46 y.o. female presenting after a fall at home.  The fall was unwitnessed. Patient's caretaker heard a noise and went into the room and tried to get her up. It seemed like she was walking off balanced and her speech seemed slurred. There was no loss of control of bowel or bladder or tongue biting.  Patient was felt to be different from her baseline though and brought in for fear that she had a seizure.   At baseline speech slurred but seems more slurred now.  She is ambulatory and is able to dress and feed herself.  She is constantly asking for food whether she is hungry or not.  Has a tremor at baseline.  Has a cold that has developed over the past couple of days.   She has a Has a history Mental Retardation and schizphrenia. Also has a history of seizures with her last being in December of 2005.  Her seizures are usually characterized by rolling eyeballs and generalized tonic-clonic movements.   She has been living with her current caregiver assistance 2005 and her mother is peripherally involved in her care and makes medical decisions for his health care power of attorney   Past Medical History  Diagnosis Date  . Mental retardation   . Schizophrenia   . Fall     History reviewed. No pertinent past surgical history.  Family History  Problem Relation Age of Onset  . Osteoarthritis Mother   . Diabetes Father     Social History:  does not have a smoking history on file. She does not have any smokeless tobacco history on file. Her alcohol and drug histories not on file.  No Known Allergies  Medications:  I have reviewed the patient's current medications. Prior to Admission:  Prescriptions prior to admission  Medication Sig Dispense Refill  . alendronate (FOSAMAX) 70 MG tablet Take 70 mg by mouth every 7 (seven) days. Take with a full glass of water on an empty stomach  every Thursday.      . Calcium Carbonate-Vitamin D (OSCAL 500/200 D-3 PO) Take 1 tablet by mouth daily.      Marland Kitchen desmopressin (DDAVP) 0.1 MG tablet Take 0.1 mg by mouth 2 (two) times daily.      . divalproex (DEPAKOTE) 500 MG DR tablet Take 500 mg by mouth 3 (three) times daily. Take 1500 mg by mouth at bedtime.      . docusate sodium (COLACE) 100 MG capsule Take 100 mg by mouth daily.      . Emollient (EUCERIN) lotion Apply topically 2 (two) times daily.      . Ferrous Sulfate 143 (45 FE) MG TBCR Take 1 tablet by mouth daily.      . hydrOXYzine (ATARAX/VISTARIL) 50 MG tablet Take 50 mg by mouth 3 (three) times daily as needed.      . Iloperidone (FANAPT) 12 MG TABS Take 24 mg by mouth daily.      Marland Kitchen levothyroxine (SYNTHROID, LEVOTHROID) 50 MCG tablet Take 50 mcg by mouth daily.      Marland Kitchen lithium 300 MG tablet Take 300 mg by mouth 2 (two) times daily.      Marland Kitchen loratadine (LORADAMED) 10 MG tablet Take 10 mg by mouth daily.      . Multiple Vitamins-Minerals (THERATRUM COMPLETE PO) Take 1 tablet by mouth daily.      . traZODone (DESYREL) 100 MG tablet  Take 100 mg by mouth at bedtime. Take 300 mg by mouth at bedtime.       Scheduled:   . desmopressin  0.1 mg Oral BID  . divalproex  1,000 mg Oral Once  . divalproex  1,500 mg Oral QHS  . Ferrous Sulfate  1 tablet Oral Daily  . heparin  5,000 Units Subcutaneous Q8H  . Iloperidone  24 mg Oral Daily  . levothyroxine  50 mcg Oral Daily  . traZODone  100 mg Oral QHS  . valproate sodium  1,000 mg Intravenous Once    ROS: History obtained from caregiver  General ROS: negative for - chills, fatigue, fever, night sweats, weight gain or weight loss Psychological ROS: negative for - behavioral disorder, hallucinations, memory difficulties, mood swings or suicidal ideation Ophthalmic ROS: negative for - blurry vision, double vision, eye pain or loss of vision ENT ROS: negative for - epistaxis, nasal discharge, oral lesions, sore throat, tinnitus or  vertigo Allergy and Immunology ROS: negative for - hives or itchy/watery eyes Hematological and Lymphatic ROS: negative for - bleeding problems, bruising or swollen lymph nodes Endocrine ROS: negative for - galactorrhea, hair pattern changes, polydipsia/polyuria or temperature intolerance Respiratory ROS: negative for - cough, hemoptysis, shortness of breath or wheezing Cardiovascular ROS: negative for - chest pain, dyspnea on exertion, edema or irregular heartbeat Gastrointestinal ROS: negative for - abdominal pain, diarrhea, hematemesis, nausea/vomiting or stool incontinence Genito-Urinary ROS: negative for - dysuria, hematuria, incontinence or urinary frequency/urgency Musculoskeletal ROS: negative for - joint swelling or muscular weakness Neurological ROS: as noted in HPI Dermatological ROS: negative for rash and skin lesion changes  Physical Examination: Blood pressure 141/67, pulse 82, temperature 97.9 F (36.6 C), temperature source Oral, resp. rate 18, SpO2 95.00%.  Neurologic Examination Mental Status: Alert.  Follows simple commands.  Speech dysarthric.   Cranial Nerves: II: Discs flat bilaterally; Visual fields grossly normal, pupils equal, round, reactive to light and accommodation III,IV, VI: ptosis not present, extra-ocular motions intact bilaterally V,VII: right facial droop, facial light touch sensation normal bilaterally VIII: hearing normal bilaterally IX,X: gag reflex present XI: bilateral shoulder shrug XII: midline tongue extension Motor: Lifts all extremities against gravity and is able to maintain.  Coarse tremor noted in the BUE's Sensory: Pinprick and light touch intact throughout, bilaterally Deep Tendon Reflexes: 3+ and symmetric throughout Plantars: Right: upgoing   Left: upgoing Cerebellar: Unable to perform secondary to cooperation Gait: not tested secondary to cooperation CV: pulses palpable throughout     Laboratory Studies:   Basic Metabolic  Panel:  Lab 05/14/12 1249  NA 137  K 4.7  CL 102  CO2 30  GLUCOSE 77  BUN 19  CREATININE 1.29*  CALCIUM 8.9  MG --  PHOS --    Liver Function Tests: No results found for this basename: AST:5,ALT:5,ALKPHOS:5,BILITOT:5,PROT:5,ALBUMIN:5 in the last 168 hours No results found for this basename: LIPASE:5,AMYLASE:5 in the last 168 hours No results found for this basename: AMMONIA:3 in the last 168 hours  CBC:  Lab 05/14/12 1249  WBC 10.1  NEUTROABS 7.5  HGB 10.5*  HCT 32.3*  MCV 101.9*  PLT 63*    Cardiac Enzymes: No results found for this basename: CKTOTAL:5,CKMB:5,CKMBINDEX:5,TROPONINI:5 in the last 168 hours  BNP: No components found with this basename: POCBNP:5  CBG: No results found for this basename: GLUCAP:5 in the last 168 hours  Microbiology: Results for orders placed in visit on 12/26/08  [facility] SMEAR     Status: Normal   Collection Time  12/27/08  1:39 PM      Component Value Range Status Comment   Smear Result Smear Available   Final     Coagulation Studies: No results found for this basename: LABPROT:5,INR:5 in the last 72 hours  Urinalysis: No results found for this basename: COLORURINE:2,APPERANCEUR:2,LABSPEC:2,PHURINE:2,GLUCOSEU:2,HGBUR:2,BILIRUBINUR:2,KETONESUR:2,PROTEINUR:2,UROBILINOGEN:2,NITRITE:2,LEUKOCYTESUR:2 in the last 168 hours  Lipid Panel:  No results found for this basename: chol, trig, hdl, cholhdl, vldl, ldlcalc    HgbA1C:  No results found for this basename: HGBA1C    Urine Drug Screen:     Component Value Date/Time   LABOPIA NONE DETECTED 02/14/2008 0407   COCAINSCRNUR NONE DETECTED 02/14/2008 0407   LABBENZ NONE DETECTED 02/14/2008 0407   AMPHETMU NONE DETECTED 02/14/2008 0407   THCU NONE DETECTED 02/14/2008 0407   LABBARB  Value: NONE DETECTED        DRUG SCREEN FOR MEDICAL PURPOSES ONLY.  IF CONFIRMATION IS NEEDED FOR ANY PURPOSE, NOTIFY LAB WITHIN 5 DAYS. 02/14/2008 0407    Alcohol Level: No results found for this basename:  ETH:2 in the last 168 hours  Imaging: Ct Head Wo Contrast  05/14/2012  *RADIOLOGY REPORT*  Clinical Data: Rule out CVA  CT HEAD WITHOUT CONTRAST  Technique:  Contiguous axial images were obtained from the base of the skull through the vertex without contrast.  Comparison: CT 05/21/2006  Findings: Ventricle size is normal.  Negative for acute infarct. Negative hemorrhage or mass.  The skull is diffusely thickened.  Sinusitis with air-fluid levels in both maxillary sinuses.  IMPRESSION: No acute intracranial abnormality.  Sinusitis with air-fluid levels.   Original Report Authenticated By: Janeece Riggers, M.D.      Assessment/Plan: 46 year old female with MR and seizures, on Lithium for schizophrenia, who presents after a fall with high Lithium level.  Has tremor, speech that is more dysarthric and ? Seizure.  On Depakote at home.    Seizure (05/14/2012)   Assessment: Patient with fall.  Unclear if actual seizure, patient with poor balance and gait at this time as well per caregiver.  Has a history of seizure.  On Depakote.  Level 60.1.  Lithium level supratherapeutic.  This can decrease seizure threshold and cause a myriad of other neurologic symptoms.  CT stable.     Plan:  1.  Depakote 1000mg  IV now with 1000mg  tonight.  Would start home dose tomorrow with prn Ativan to cover for breakthrough seizure.    2.  Depakote level in AM  3.  Lithium level to be addressed.  4.  Seizure precautions. Tremor   Assessment:  Patient with tremor at baseline that is variable.  Likely worse today due to Lithium   Plan: Will follow clinically but start no new medications at this time.   Thana Farr, MD [facility] [phone] 05/14/2012, 5:06 PM

## 2012-05-14 NOTE — ED Notes (Signed)
Triage info entered by Diona Foley under [place] perdues name.

## 2012-05-15 ENCOUNTER — Encounter (HOSPITAL_COMMUNITY): Payer: Self-pay | Admitting: *Deleted

## 2012-05-15 DIAGNOSIS — R569 Unspecified convulsions: Principal | ICD-10-CM

## 2012-05-15 LAB — CBC
MCH: 32 pg (ref 26.0–34.0)
MCV: 103.3 fL — ABNORMAL HIGH (ref 78.0–100.0)
Platelets: 61 10*3/uL — ABNORMAL LOW (ref 150–400)
RDW: 16.1 % — ABNORMAL HIGH (ref 11.5–15.5)
WBC: 7.1 10*3/uL (ref 4.0–10.5)

## 2012-05-15 LAB — COMPREHENSIVE METABOLIC PANEL
Albumin: 2 g/dL — ABNORMAL LOW (ref 3.5–5.2)
Alkaline Phosphatase: 56 U/L (ref 39–117)
BUN: 18 mg/dL (ref 6–23)
CO2: 24 mEq/L (ref 19–32)
Chloride: 110 mEq/L (ref 96–112)
GFR calc non Af Amer: 55 mL/min — ABNORMAL LOW (ref >90)
Potassium: 4.6 mEq/L (ref 3.5–5.1)
Total Bilirubin: 0.2 mg/dL — ABNORMAL LOW (ref 0.3–1.2)

## 2012-05-15 LAB — VALPROIC ACID LEVEL: Valproic Acid Lvl: 85.6 ug/mL (ref 50.0–100.0)

## 2012-05-15 MED ORDER — DIVALPROEX SODIUM 250 MG PO DR TAB: 750.0000 mg | DELAYED_RELEASE_TABLET | Freq: Three times a day (TID) | ORAL | Status: DC

## 2012-05-15 MED ORDER — DIVALPROEX SODIUM ER 500 MG PO TB24: 1500.0000 mg | ORAL_TABLET | Freq: Every day | ORAL | Status: AC

## 2012-05-15 NOTE — Discharge Summary (Signed)
Physician Discharge Summary  Diamond Bowman ZOX:[id number] DOB: Jun 21, 1965 DOA: 05/14/2012  PCP: No primary provider on file.  Admit date: 05/14/2012 Discharge date: 05/15/2012  Time spent: 20 minutes  Recommendations for Outpatient Follow-up:  1. Recommend outpatient psychiatry followup 2. Recommend followup as an outpatient with primary neurologist  Discharge Diagnoses:  Principal Problem:  *Seizure Active Problems:  Mental retardation  Bipolar 2 disorder  Fall  Hypothyroid  Osteoporosis   Discharge Condition: Stable  Diet recommendation: Regular diet  Filed Weights   05/14/12 1700  Weight: 56.79 kg (125 lb 3.2 oz)    History of present illness:  46 y.o. female who presented t [facility] 12/21 with h/o fall about 11:00 am. At the time she was in her bedroom. This fall was unwitnessed. Patient's gaurdian's daugher heard a noise.  The daughter went into the room, and tried to get her up. It seemed like she was walking off balanced andher speech seemed slurred. Her speech at times is slurred anyway.  Today her speech was worse. There doesn't appear to have been any loss of control of bowel or bladder  She has a h/o Mental Retardation which is allegedly mild and has a h/o schizophrenia.  This has never happened before.  The last sz in dec 2005 was differnet in that she had rolling eyeballs, and she had a generalized process> than this episdoe. She has been taking all of her meds-she has not refused them.  She has been living with her current caregiver assistance 2005 and her mother is peripherally involved in her care and makes medical decisions for his health care power of attorney.   Hospital Course:  1. Likely seizure-given description, patient likely had a petit mal seizure versus a small stroke. Am less inclined to think this stroke given she is nonfocal and seems to be at her normal baseline. Neurologist was consulted regarding her care and recommended keeping her on Depakote  1500 mg at night, her Depakote level was 61.3 on this admission. It is unclear whether she had an actual seizure per se or a mechanical fall and this resulted in a mild contusion/concussion-CT scan of the head and brain was negative for any acute intracranial abnormality and she returned to her baseline on day subsequent admission-was thought that maybe she had a seizure because of elevated lithium levels. In either event and has been discontinued as per below 2. Supratherapeutic lithium-admission lithium level was 3.0 to-probably secondary to chronic congestion with recent illness/upper respiratory illness-she returned to her regular baseline. Her lithium level on day subsequent to admission and even on day of admission was 1.29. She was given IV fluids at 150 cc per hour nephrology was consulted-they recommended expectant management and sure enough patient's lithium level fell to within the therapeutic range by discharge. 3. Hypothyroidism-TSH was ordered and is pending-she potentially will need this followed up in the outpatient setting 4. Bipolar disorder-continue lloperidone 24 mg, increased Depakote for #1 as per above. 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 Give IVF-the base metabolic panel was stable 6. ?Viral illness-will get CXR to ensure no PNA-patient was to she did get this study done  Procedures:  CT scan head 12/21   Consultations:  Neurlogy  Nephrology  Discharge Exam: Filed Vitals:   05/14/12 1700 05/14/12 1722 05/14/12 2100 05/15/12 0500  BP:  132/105 123/71 117/76  Pulse:  83 88 98  Temp:  98.1 F (36.7 C) 98.1 F (36.7 C) 98  F (36.7 C)  TempSrc:  Oral    Resp:  18 18 18   Height: 4\' 9"  (1.448 m)     Weight: 56.79 kg (125 lb 3.2 oz)     SpO2:  95% 97% 97%    Awakens at bedside-very hungry. Manages to drop down 8 ounces of orange juice within about 3 seconds and although she has tremors, there is no focal deficit that would  prevent her from having her breakfast by itself. No seizure-like activity noted per night nurse  General: Alert pleasant oriented Caucasian female no apparent distress Cardiovascular: S1-S2 no murmur rub or Respiratory: Clinical clear  Discharge Instructions  Discharge Orders    Future Orders Please Complete By Expires   Diet - low sodium heart healthy      Increase activity slowly      Call MD for:  redness, tenderness, or signs of infection (pain, swelling, redness, odor or green/yellow discharge around incision site)      Call MD for:  hives      Call MD for:  extreme fatigue      Call MD for:  persistant dizziness or light-headedness      Call MD for:  persistant nausea and vomiting          Medication List     As of 05/15/2012  8:44 AM    STOP taking these medications           lithium 300 MG tablet      TAKE these medications         desmopressin 0.1 MG tablet   Commonly known as: DDAVP   Take 0.1 mg by mouth 2 (two) times daily.      divalproex 500 MG 24 hr tablet   Commonly known as: DEPAKOTE ER   Take 3 tablets (1,500 mg total) by mouth at bedtime.      docusate sodium 100 MG capsule   Commonly known as: COLACE   Take 100 mg by mouth daily.      eucerin lotion   Apply topically 2 (two) times daily.      FANAPT 12 MG Tabs   Generic drug: Iloperidone   Take 24 mg by mouth daily.      Ferrous Sulfate 143 (45 FE) MG Tbcr   Take 1 tablet by mouth daily.      FOSAMAX 70 MG tablet   Generic drug: alendronate   Take 70 mg by mouth every 7 (seven) days. Take with a full glass of water on an empty stomach every Thursday.      hydrOXYzine 50 MG tablet   Commonly known as: ATARAX/VISTARIL   Take 50 mg by mouth 3 (three) times daily as needed.      levothyroxine 50 MCG tablet   Commonly known as: SYNTHROID, LEVOTHROID   Take 50 mcg by mouth daily.      LORADAMED 10 MG tablet   Generic drug: loratadine   Take 10 mg by mouth daily.      OSCAL 500/200  D-3 PO   Take 1 tablet by mouth daily.      THERATRUM COMPLETE PO   Take 1 tablet by mouth daily.      traZODone 100 MG tablet   Commonly known as: DESYREL   Take 100 mg by mouth at bedtime. Take 300 mg by mouth at bedtime.           The results of significant diagnostics from this hospitalization (including imaging, microbiology, ancillary  and laboratory) are listed below for reference.    Significant Diagnostic Studies: Ct Head Wo Contrast  05/14/2012  *RADIOLOGY REPORT*  Clinical Data: Rule out CVA  CT HEAD WITHOUT CONTRAST  Technique:  Contiguous axial images were obtained from the base of the skull through the vertex without contrast.  Comparison: CT 05/21/2006  Findings: Ventricle size is normal.  Negative for acute infarct. Negative hemorrhage or mass.  The skull is diffusely thickened.  Sinusitis with air-fluid levels in both maxillary sinuses.  IMPRESSION: No acute intracranial abnormality.  Sinusitis with air-fluid levels.   Original Report Authenticated By: Janeece Riggers, M.D.     Microbiology: No results found for this or any previous visit (from the past 240 hour(s)).   Labs: Basic Metabolic Panel:  Lab 05/14/12 1610  NA 137  K 4.7  CL 102  CO2 30  GLUCOSE 77  BUN 19  CREATININE 1.29*  CALCIUM 8.9  MG --  PHOS --   Liver Function Tests: No results found for this basename: AST:5,ALT:5,ALKPHOS:5,BILITOT:5,PROT:5,ALBUMIN:5 in the last 168 hours No results found for this basename: LIPASE:5,AMYLASE:5 in the last 168 hours No results found for this basename: AMMONIA:5 in the last 168 hours CBC:  Lab 05/14/12 1249  WBC 10.1  NEUTROABS 7.5  HGB 10.5*  HCT 32.3*  MCV 101.9*  PLT 63*   Cardiac Enzymes: No results found for this basename: CKTOTAL:5,CKMB:5,CKMBINDEX:5,TROPONINI:5 in the last 168 hours BNP: BNP (last 3 results) No results found for this basename: PROBNP:3 in the last 8760 hours CBG: No results found for this basename: GLUCAP:5 in the last  168 hours     Signed:  Rhetta Mura  [facility] 05/15/2012, 8:38 AM

## 2012-05-15 NOTE — Progress Notes (Signed)
Subjective: Patient sleeping this morning but eventually awakened.  Seems at baseline.  Continued tremor but able to feed self, etc.  No seizure activity overnight.    Objective: Current vital signs: BP 117/76  Pulse 98  Temp 98 F (36.7 C) (Oral)  Resp 18  Ht 4\' 9"  (1.448 m)  Wt 56.79 kg (125 lb 3.2 oz)  BMI 27.09 kg/m2  SpO2 97% Vital signs in last 24 hours: Temp:  [97.9 F (36.6 C)-98.1 F (36.7 C)] 98 F (36.7 C) (12/22 0500) Pulse Rate:  [82-98] 98  (12/22 0500) Resp:  [18] 18  (12/22 0500) BP: (117-141)/(67-105) 117/76 mmHg (12/22 0500) SpO2:  [95 %-97 %] 97 % (12/22 0500) Weight:  [56.79 kg (125 lb 3.2 oz)] 56.79 kg (125 lb 3.2 oz) (12/21 1700)  Intake/Output from previous day: 12/21 0701 - 12/22 0700 In: 240 [P.O.:240] Out: -  Intake/Output this shift:   Nutritional status: General  Neurologic Exam: Mental Status:  Alert. Follows simple commands. Speech dysarthric.  Cranial Nerves:  II: Discs flat bilaterally; Visual fields grossly normal, pupils equal, round, reactive to light and accommodation  III,IV, VI: ptosis not present, extra-ocular motions intact bilaterally  V,VII: right facial droop, facial light touch sensation normal bilaterally  VIII: hearing normal bilaterally  IX,X: gag reflex present  XI: bilateral shoulder shrug  XII: midline tongue extension  Motor:  Lifts all extremities against gravity and is able to maintain. Coarse tremor noted in the BUE's  Sensory: Pinprick and light touch intact throughout, bilaterally  Deep Tendon Reflexes: 3+ and symmetric throughout  Plantars:  Right: upgoing  Left: upgoing  Cerebellar:  Unable to perform secondary to cooperation  Gait: not tested secondary to cooperation  CV: pulses palpable throughout    Lab Results: Basic Metabolic Panel:  Lab 05/14/12 4782  NA 137  K 4.7  CL 102  CO2 30  GLUCOSE 77  BUN 19  CREATININE 1.29*  CALCIUM 8.9  MG --  PHOS --    Liver Function Tests: No results  found for this basename: AST:5,ALT:5,ALKPHOS:5,BILITOT:5,PROT:5,ALBUMIN:5 in the last 168 hours No results found for this basename: LIPASE:5,AMYLASE:5 in the last 168 hours No results found for this basename: AMMONIA:3 in the last 168 hours  CBC:  Lab 05/14/12 1249  WBC 10.1  NEUTROABS 7.5  HGB 10.5*  HCT 32.3*  MCV 101.9*  PLT 63*    Cardiac Enzymes: No results found for this basename: CKTOTAL:5,CKMB:5,CKMBINDEX:5,TROPONINI:5 in the last 168 hours  Lipid Panel: No results found for this basename: CHOL:5,TRIG:5,HDL:5,CHOLHDL:5,VLDL:5,LDLCALC:5 in the last 168 hours  CBG: No results found for this basename: GLUCAP:5 in the last 168 hours  Microbiology: Results for orders placed in visit on 12/26/08  [facility] SMEAR     Status: Normal   Collection Time   12/27/08  1:39 PM      Component Value Range Status Comment   Smear Result Smear Available   Final     Coagulation Studies: No results found for this basename: LABPROT:5,INR:5 in the last 72 hours  Imaging: Ct Head Wo Contrast  05/14/2012  *RADIOLOGY REPORT*  Clinical Data: Rule out CVA  CT HEAD WITHOUT CONTRAST  Technique:  Contiguous axial images were obtained from the base of the skull through the vertex without contrast.  Comparison: CT 05/21/2006  Findings: Ventricle size is normal.  Negative for acute infarct. Negative hemorrhage or mass.  The skull is diffusely thickened.  Sinusitis with air-fluid levels in both maxillary sinuses.  IMPRESSION: No acute intracranial abnormality.  Sinusitis with air-fluid levels.   Original Report Authenticated By: Janeece Riggers, M.D.     Medications:  I have reviewed the patient's current medications. Scheduled:   . desmopressin  0.1 mg Oral BID  . divalproex  1,500 mg Oral QHS  . ferrous sulfate  325 mg Oral Daily  . heparin  5,000 Units Subcutaneous Q8H  . Iloperidone  24 mg Oral Daily  . levothyroxine  50 mcg Oral QAC breakfast  . traZODone  100 mg Oral QHS     Assessment/Plan:  Patient Active Hospital Problem List: Seizure (05/14/2012)   Assessment: No further seizure activity.  Likely a consequence of the Lithium.  Lithium level now within normal range.  Lithium has been held   Plan: Would continue Depakote at pre-admission dose and administration time.  Level therapeutic on presentation.  No further intervention recommended at this time    LOS: 1 day   Thana Farr, MD [facility] [phone] 05/15/2012  8:34 AM

## 2012-05-15 NOTE — Progress Notes (Signed)
Physician Discharge Summary  Diamond Bowman OZH:[id number] DOB: 1966/04/11 DOA: 05/14/2012  PCP: No primary provider on file.  Admit date: 05/14/2012 Discharge date: 05/15/2012  Time spent: 20 minutes  Recommendations for Outpatient Follow-up:  1. Recommend outpatient psychiatry followup 2. Recommend followup as an outpatient with primary neurologist  Discharge Diagnoses:  Principal Problem:  *Seizure Active Problems:  Mental retardation  Bipolar 2 disorder  Fall  Hypothyroid  Osteoporosis   Discharge Condition: Stable  Diet recommendation: Regular diet  Filed Weights   05/14/12 1700  Weight: 56.79 kg (125 lb 3.2 oz)    History of present illness:  46 y.o. female who presented t [facility] with h/o fall about 11:00 am. At the time she was in her bedroom. This fall was unwitnessed. Patient's gaurdian's daugher heard a noise.  The daughter went into the room, and tried to get her up. It seemed like she was walking off balanced andher speech seemed slurred. Her speech at times is slurred anyway.  Today her speech was worse. There doesn't appear to have been any loss of control of bowel or bladder  She has a h/o Mental Retardation which is allegedly mild and has a h/o schizophrenia.  This has never happened before.  The last sz in dec 2005 was differnet in that she had rolling eyeballs, and she had a generalized process> than this episdoe. She has been taking all of her meds-she has not refused them.  She has been living with her current caregiver assistance 2005 and her mother is peripherally involved in her care and makes medical decisions for his health care power of attorney.   Hospital Course:  1. Likely seizure-given description, patient likely had a petit mal seizure versus a small stroke. Am less inclined to think this stroke given she is nonfocal and seems to be at her normal baseline. Neurologist was consulted regarding her care and recommended keeping her on Depakote  1500 mg at night, her Depakote level was 61.3 on this admission. It is unclear whether she had an actual seizure per se or a mechanical fall and this resulted in a mild contusion/concussion-CT scan of the head and brain was negative for any acute intracranial abnormality and she returned to her baseline on day subsequent admission-was thought that maybe she had a seizure because of elevated lithium levels. In either event and has been discontinued as per below 2. Supratherapeutic lithium-admission lithium level was 3.0 to-probably secondary to chronic congestion with recent illness/upper respiratory illness-she returned to her regular baseline. Her lithium level on day subsequent to admission and even on day of admission was 1.29. She was given IV fluids at 150 cc per hour nephrology was consulted-they recommended expectant management and sure enough patient's lithium level fell to within the therapeutic range by discharge. 3. Hypothyroidism-TSH was ordered and is pending-she potentially will need this followed up in the outpatient setting 4. Bipolar disorder-continue lloperidone 24 mg, increased Depakote for #1 as per above. 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 Give IVF-the base metabolic panel was stable 6. ?Viral illness-will get CXR to ensure no PNA-patient was to she did get this study done  Procedures:  CT scan head 12/21   Consultations:  Neurlogy  Nephrology  Discharge Exam: Filed Vitals:   05/14/12 1700 05/14/12 1722 05/14/12 2100 05/15/12 0500  BP:  132/105 123/71 117/76  Pulse:  83 88 98  Temp:  98.1 F (36.7 C) 98.1 F (36.7 C) 98  F (36.7 C)  TempSrc:  Oral    Resp:  18 18 18   Height: 4\' 9"  (1.448 m)     Weight: 56.79 kg (125 lb 3.2 oz)     SpO2:  95% 97% 97%    Awakens at bedside-very hungry. Manages to drop down 8 ounces of orange juice within about 3 seconds and although she has tremors, there is no focal deficit that would  prevent her from having her breakfast by itself. No seizure-like activity noted per night nurse  General: Alert pleasant oriented Caucasian female no apparent distress Cardiovascular: S1-S2 no murmur rub or Respiratory: Clinical clear  Discharge Instructions  Discharge Orders    Future Orders Please Complete By Expires   Diet - low sodium heart healthy      Increase activity slowly      Call MD for:  redness, tenderness, or signs of infection (pain, swelling, redness, odor or green/yellow discharge around incision site)      Call MD for:  hives      Call MD for:  extreme fatigue      Call MD for:  persistant dizziness or light-headedness      Call MD for:  persistant nausea and vomiting          Medication List     As of 05/15/2012  8:44 AM    STOP taking these medications         divalproex 500 MG DR tablet   Commonly known as: DEPAKOTE      lithium 300 MG tablet      TAKE these medications         desmopressin 0.1 MG tablet   Commonly known as: DDAVP   Take 0.1 mg by mouth 2 (two) times daily.      divalproex 500 MG 24 hr tablet   Commonly known as: DEPAKOTE ER   Take 3 tablets (1,500 mg total) by mouth at bedtime.      docusate sodium 100 MG capsule   Commonly known as: COLACE   Take 100 mg by mouth daily.      eucerin lotion   Apply topically 2 (two) times daily.      FANAPT 12 MG Tabs   Generic drug: Iloperidone   Take 24 mg by mouth daily.      Ferrous Sulfate 143 (45 FE) MG Tbcr   Take 1 tablet by mouth daily.      FOSAMAX 70 MG tablet   Generic drug: alendronate   Take 70 mg by mouth every 7 (seven) days. Take with a full glass of water on an empty stomach every Thursday.      hydrOXYzine 50 MG tablet   Commonly known as: ATARAX/VISTARIL   Take 50 mg by mouth 3 (three) times daily as needed.      levothyroxine 50 MCG tablet   Commonly known as: SYNTHROID, LEVOTHROID   Take 50 mcg by mouth daily.      LORADAMED 10 MG tablet   Generic drug:  loratadine   Take 10 mg by mouth daily.      OSCAL 500/200 D-3 PO   Take 1 tablet by mouth daily.      THERATRUM COMPLETE PO   Take 1 tablet by mouth daily.      traZODone 100 MG tablet   Commonly known as: DESYREL   Take 100 mg by mouth at bedtime. Take 300 mg by mouth at bedtime.  The results of significant diagnostics from this hospitalization (including imaging, microbiology, ancillary and laboratory) are listed below for reference.    Significant Diagnostic Studies: Ct Head Wo Contrast  05/14/2012  *RADIOLOGY REPORT*  Clinical Data: Rule out CVA  CT HEAD WITHOUT CONTRAST  Technique:  Contiguous axial images were obtained from the base of the skull through the vertex without contrast.  Comparison: CT 05/21/2006  Findings: Ventricle size is normal.  Negative for acute infarct. Negative hemorrhage or mass.  The skull is diffusely thickened.  Sinusitis with air-fluid levels in both maxillary sinuses.  IMPRESSION: No acute intracranial abnormality.  Sinusitis with air-fluid levels.   Original Report Authenticated By: Janeece Riggers, M.D.     Microbiology: No results found for this or any previous visit (from the past 240 hour(s)).   Labs: Basic Metabolic Panel:  Lab 05/14/12 1610  NA 137  K 4.7  CL 102  CO2 30  GLUCOSE 77  BUN 19  CREATININE 1.29*  CALCIUM 8.9  MG --  PHOS --   Liver Function Tests: No results found for this basename: AST:5,ALT:5,ALKPHOS:5,BILITOT:5,PROT:5,ALBUMIN:5 in the last 168 hours No results found for this basename: LIPASE:5,AMYLASE:5 in the last 168 hours No results found for this basename: AMMONIA:5 in the last 168 hours CBC:  Lab 05/14/12 1249  WBC 10.1  NEUTROABS 7.5  HGB 10.5*  HCT 32.3*  MCV 101.9*  PLT 63*   Cardiac Enzymes: No results found for this basename: CKTOTAL:5,CKMB:5,CKMBINDEX:5,TROPONINI:5 in the last 168 hours BNP: BNP (last 3 results) No results found for this basename: PROBNP:3 in the last 8760  hours CBG: No results found for this basename: GLUCAP:5 in the last 168 hours     Signed:  Rhetta Mura  [facility] 05/15/2012, 8:38 AM

## 2012-09-19 ENCOUNTER — Other Ambulatory Visit: Payer: Self-pay | Admitting: *Deleted

## 2012-09-19 DIAGNOSIS — Z1231 Encounter for screening mammogram for malignant neoplasm of breast: Secondary | ICD-10-CM

## 2013-06-25 ENCOUNTER — Emergency Department (HOSPITAL_COMMUNITY)
Admission: EM | Admit: 2013-06-25 | Discharge: 2013-06-25 | Disposition: A | Discharge status: Home / Self Care | Payer: Medicaid Other | Attending: Emergency Medicine | Admitting: Emergency Medicine

## 2013-06-25 ENCOUNTER — Emergency Department (HOSPITAL_COMMUNITY): Payer: Medicaid Other

## 2013-06-25 ENCOUNTER — Encounter (HOSPITAL_COMMUNITY): Payer: Self-pay | Admitting: Emergency Medicine

## 2013-06-25 DIAGNOSIS — R569 Unspecified convulsions: Secondary | ICD-10-CM | POA: Insufficient documentation

## 2013-06-25 DIAGNOSIS — IMO0002 Reserved for concepts with insufficient information to code with codable children: Secondary | ICD-10-CM | POA: Insufficient documentation

## 2013-06-25 DIAGNOSIS — R209 Unspecified disturbances of skin sensation: Secondary | ICD-10-CM | POA: Insufficient documentation

## 2013-06-25 DIAGNOSIS — F79 Unspecified intellectual disabilities: Secondary | ICD-10-CM | POA: Insufficient documentation

## 2013-06-25 DIAGNOSIS — R011 Cardiac murmur, unspecified: Secondary | ICD-10-CM | POA: Insufficient documentation

## 2013-06-25 DIAGNOSIS — Z79899 Other long term (current) drug therapy: Secondary | ICD-10-CM | POA: Insufficient documentation

## 2013-06-25 DIAGNOSIS — S42033A Displaced fracture of lateral end of unspecified clavicle, initial encounter for closed fracture: Secondary | ICD-10-CM | POA: Insufficient documentation

## 2013-06-25 DIAGNOSIS — Y929 Unspecified place or not applicable: Secondary | ICD-10-CM | POA: Insufficient documentation

## 2013-06-25 DIAGNOSIS — F209 Schizophrenia, unspecified: Secondary | ICD-10-CM | POA: Insufficient documentation

## 2013-06-25 DIAGNOSIS — W1789XA Other fall from one level to another, initial encounter: Secondary | ICD-10-CM | POA: Insufficient documentation

## 2013-06-25 DIAGNOSIS — S42009A Fracture of unspecified part of unspecified clavicle, initial encounter for closed fracture: Secondary | ICD-10-CM

## 2013-06-25 DIAGNOSIS — Y9301 Activity, walking, marching and hiking: Secondary | ICD-10-CM | POA: Insufficient documentation

## 2013-06-25 HISTORY — DX: Unspecified convulsions: R56.9

## 2013-06-25 HISTORY — DX: Drug induced subacute dyskinesia: G24.01

## 2013-06-25 HISTORY — DX: Cardiac murmur, unspecified: R01.1

## 2013-06-25 MED ORDER — HYDROCODONE-ACETAMINOPHEN 5-325 MG PO TABS: 0.5000 | ORAL_TABLET | ORAL | Status: DC | PRN

## 2013-06-25 NOTE — ED Notes (Signed)
Patient transported to CT and xray 

## 2013-06-25 NOTE — ED Notes (Addendum)
Pt developmentally delayed, hx of schizophrenia, hears voices, this is normal for her, ongoing throughout her life, per report from her care giver. Pt reports she fell and hit her head today and her right arm hurts. Pt able to move and raise right arm. Pt presents with minimal, non-bleeding, abrasion to  Middle edge of scalp on forehead. Caregiver witnessed the pt fall and reports pt able to stand immediately after fall but reports pt did not lose consciousness. Pt follows commands, alert and oriented to self, place, and situation but not to time, but this is baseline for pt.

## 2013-06-25 NOTE — ED Provider Notes (Signed)
CSN: [id number]     Arrival date & time 06/25/13  0249 History   First MD Initiated Contact with Patient 06/25/13 0403     Chief Complaint  Patient presents with  . Fall   (Consider location/radiation/quality/duration/timing/severity/associated sxs/prior Treatment) HPI Comments: Patient presents to the ER after a fall. Caregiver reports that the patient was walking up steps, turned around to say something and lost her balance. She fell down several steps. There was no loss of consciousness. Patient got up on her own and was ambulatory. She has been complaining of right arm pain after the fall.  Patient is a 48 y.o. female presenting with fall.  Fall    Past Medical History  Diagnosis Date  . Mental retardation   . Schizophrenia   . Fall   . Tardive dyskinesia   . Heart murmur   . Seizures    History reviewed. No pertinent past surgical history. Family History  Problem Relation Age of Onset  . Osteoarthritis Mother   . Diabetes Father    History  Substance Use Topics  . Smoking status: Never Smoker   . Smokeless tobacco: Not on file  . Alcohol Use: No   OB History   Grav Para Term Preterm Abortions TAB SAB Ect Mult Living                 Review of Systems  Musculoskeletal:       Arm pain  All other systems reviewed and are negative.    Allergies  Review of patient's allergies indicates no known allergies.  Home Medications   Current Outpatient Rx  Name  Route  Sig  Dispense  Refill  . alendronate (FOSAMAX) 70 MG tablet   Oral   Take 70 mg by mouth every 7 (seven) days. Take with a full glass of water on an empty stomach every Thursday.         . Calcium Carbonate-Vitamin D (OSCAL 500/200 D-3 PO)   Oral   Take 1 tablet by mouth daily.         Marland Kitchen desmopressin (DDAVP) 0.1 MG tablet   Oral   Take 0.1 mg by mouth 2 (two) times daily.         . divalproex (DEPAKOTE ER) 500 MG 24 hr tablet   Oral   Take 3 tablets (1,500 mg total) by mouth at bedtime.         . docusate sodium (COLACE) 100 MG capsule   Oral   Take 100 mg by mouth daily.         . Emollient (EUCERIN) lotion   Topical   Apply topically 2 (two) times daily.         . Ferrous Sulfate 143 (45 FE) MG TBCR   Oral   Take 1 tablet by mouth daily.         . hydrOXYzine (ATARAX/VISTARIL) 50 MG tablet   Oral   Take 50 mg by mouth 3 (three) times daily as needed.         . Iloperidone (FANAPT) 12 MG TABS   Oral   Take 24 mg by mouth daily.         Marland Kitchen levothyroxine (SYNTHROID, LEVOTHROID) 50 MCG tablet   Oral   Take 50 mcg by mouth daily.         Marland Kitchen loratadine (LORADAMED) 10 MG tablet   Oral   Take 10 mg by mouth daily.         . Multiple  Vitamins-Minerals (THERATRUM COMPLETE PO)   Oral   Take 1 tablet by mouth daily.         . traZODone (DESYREL) 100 MG tablet   Oral   Take 100 mg by mouth at bedtime. Take 300 mg by mouth at bedtime.          BP 120/71  Pulse 121  Temp(Src) 98.2 F (36.8 C) (Oral)  Resp 18  SpO2 99% Physical Exam  Constitutional: She is oriented to person, place, and time. She appears well-developed and well-nourished. No distress.  HENT:  Head: Normocephalic. Head is with abrasion.    Right Ear: Hearing normal.  Left Ear: Hearing normal.  Nose: Nose normal.  Mouth/Throat: Oropharynx is clear and moist and mucous membranes are normal.  Eyes: Conjunctivae and EOM are normal. Pupils are equal, round, and reactive to light.  Neck: Normal range of motion. Neck supple.  Cardiovascular: Regular rhythm, S1 normal and S2 normal.  Exam reveals no gallop and no friction rub.   No murmur heard. Pulmonary/Chest: Effort normal and breath sounds normal. No respiratory distress. She exhibits no tenderness.  Abdominal: Soft. Normal appearance and bowel sounds are normal. There is no hepatosplenomegaly. There is no tenderness. There is no rebound, no guarding, no tenderness at McBurney's point and negative Murphy's sign. No hernia.    Musculoskeletal: Normal range of motion.       Right upper arm: She exhibits tenderness. She exhibits no bony tenderness and no deformity.       Right forearm: She exhibits tenderness. She exhibits no bony tenderness, no swelling and no deformity.  Neurological: She is alert and oriented to person, place, and time. She has normal strength. No cranial nerve deficit or sensory deficit. Coordination normal. GCS eye subscore is 4. GCS verbal subscore is 5. GCS motor subscore is 6.  Skin: Skin is warm, dry and intact. No rash noted. No cyanosis.  Psychiatric: She has a normal mood and affect. Her speech is normal and behavior is normal. Thought content normal.    ED Course  Procedures (including critical care time) Labs Review Labs Reviewed - No data to display Imaging Review Dg Forearm Right  06/25/2013   CLINICAL DATA:  Fall with forearm pain.  EXAM: RIGHT FOREARM - 2 VIEW  COMPARISON:  None.  FINDINGS: There is no evidence of fracture or other focal bone lesions. Soft tissues are unremarkable.  IMPRESSION: Negative.   Electronically Signed   By: Jorje Guild M.D.   On: 06/25/2013 06:33   Ct Head Wo Contrast  06/25/2013   CLINICAL DATA:  Fall. Current history of mental retardation and seizure disorder.  EXAM: CT HEAD WITHOUT CONTRAST  TECHNIQUE: Contiguous axial images were obtained from the base of the skull through the vertex without intravenous contrast.  COMPARISON:  CT HEAD W/O CM dated 05/14/2012; CT HEAD W/O CM dated 05/21/2006  FINDINGS: Ventricular system normal in size and appearance for age. Mild cortical atrophy, most prominent in the occipital and parietal lobes, unchanged since December, 2013, burr progressive since 2007. No mass lesion. No midline shift. No acute hemorrhage or hematoma. No extra-axial fluid collections. No evidence of acute infarction.  Stable marked thickening of the diploic space of the skull. Mucous retention cyst or polyp in the left maxillary sinus. Remaining  paranasal sinuses, bilateral mastoid air cells, and bilateral middle ear cavities well aerated.  IMPRESSION: 1. No acute intracranial abnormality. 2. Mild cortical atrophy, most prominent in the occipital and parietal lobes. No significant  change since December, 2013.   Electronically Signed   By: Evangeline Dakin M.D.   On: 06/25/2013 07:09   Dg Humerus Right  06/25/2013   CLINICAL DATA:  Fall with arm pain  EXAM: RIGHT HUMERUS - 2+ VIEW  COMPARISON:  None.  FINDINGS: There is a oblique fracture through the distal right clavicle, lateral to the coracoclavicular ligament, which approaches but does not clearly involve the acromioclavicular joint. There is mild offset of the acromioclavicular joint, without widening. Coracoclavicular distance is normal. The glenohumeral joint is located.  IMPRESSION: Distal right clavicle fracture and acromioclavicular joint injury/offset.   Electronically Signed   By: Jorje Guild M.D.   On: 06/25/2013 06:33    EKG Interpretation   None       MDM  Diagnosis: Clavicle fracture  Patient presents to the ER for evaluation after a fall. The patient's only complaint is right arm pain after the fall. A small abrasion on her head. CT head was performed and was negative. Patient is not experiencing any tenderness in the midline in the posterior neck or back region. She had normal range of motion and no deformity noted on the examination of the arm. X-ray of the forearm and upper arm were performed because it was not clear what area was hurting her. She has a distal clavicle fracture which explains the patient's pain.    Orpah Greek, MD 06/25/13 [phone]

## 2013-06-25 NOTE — ED Notes (Signed)
Caregiver reported that pt. lost her balanced and fell from stairs at home this evening , no LOC , ambulatory , pt. states pain at right upper arm , no deformity / superficial abrasion at left upper forehead.

## 2013-06-25 NOTE — ED Notes (Signed)
Dr. Pollina at bedside   

## 2013-06-25 NOTE — Discharge Instructions (Signed)
Clavicle Fracture °A clavicle fracture is a break in the collarbone. This is a common injury, especially in children. Collarbones do not harden until around the age of 20. Most collarbone fractures are treated with a simple arm sling. In some cases a figure-of-eight splint is used to help hold the broken bones in position. Although not often needed, surgery may be required if the bone fragments are not in the correct position (displaced).  °HOME CARE INSTRUCTIONS  °· Apply ice to the injury for 15-20 minutes each hour while awake for 2 days. Put the ice in a plastic bag and place a towel between the bag of ice and your skin. °· Wear the sling or splint constantly for as long as directed by your caregiver. You may remove the sling or splint for bathing or showering. Be sure to keep your shoulder in the same place as when the sling or splint is on. Do not lift your arm. °· If a figure-of-eight splint is applied, it must be tightened by another person every day. Tighten it enough to keep the shoulders held back. Allow enough room to place the index finger between the body and strap. Loosen the splint immediately if you feel numbness or tingling in your hands. °· Only take over-the-counter or prescription medicines for pain, discomfort, or fever as directed by your caregiver. °· Avoid activities that irritate or increase the pain for 4 to 6 weeks after surgery. °· Follow all instructions for follow-up with your caregiver. This includes any referrals, physical therapy, and rehabilitation. Any delay in obtaining necessary care could result in a delay or failure of the injury to heal properly. °SEEK MEDICAL CARE IF:  °You have pain and swelling that are not relieved with medications. °SEEK IMMEDIATE MEDICAL CARE IF:  °Your arm is numb, cold, or pale, even when the splint is loose. °MAKE SURE YOU:  °· Understand these instructions. °· Will watch your condition. °· Will get help right away if you are not doing well or get  worse. °Document Released: 02/18/2005 Document Revised: 08/03/2011 Document Reviewed: 12/15/2007 °[employer]® Patient Information ©2014 [employer]. ° °

## 2013-06-25 NOTE — ED Notes (Signed)
Pt ambulated to room B16 with slow, steady gait.

## 2013-06-25 NOTE — ED Notes (Signed)
Patient transported to CT 

## 2013-10-13 ENCOUNTER — Ambulatory Visit: Payer: Medicaid Other

## 2013-10-17 ENCOUNTER — Ambulatory Visit
Admission: RE | Admit: 2013-10-17 | Discharge: 2013-10-17 | Disposition: A | Discharge status: Home / Self Care | Payer: Medicaid Other | Source: Ambulatory Visit | Attending: *Deleted | Admitting: *Deleted

## 2013-10-17 DIAGNOSIS — Z1231 Encounter for screening mammogram for malignant neoplasm of breast: Secondary | ICD-10-CM

## 2014-05-16 ENCOUNTER — Other Ambulatory Visit: Payer: Self-pay | Admitting: Nephrology

## 2014-05-16 DIAGNOSIS — N182 Chronic kidney disease, stage 2 (mild): Secondary | ICD-10-CM

## 2014-05-21 ENCOUNTER — Ambulatory Visit
Admission: RE | Admit: 2014-05-21 | Discharge: 2014-05-21 | Disposition: A | Discharge status: Home / Self Care | Payer: Medicaid Other | Source: Ambulatory Visit | Attending: Nephrology | Admitting: Nephrology

## 2014-05-21 DIAGNOSIS — N182 Chronic kidney disease, stage 2 (mild): Secondary | ICD-10-CM

## 2014-11-11 ENCOUNTER — Emergency Department (HOSPITAL_COMMUNITY)
Admission: EM | Admit: 2014-11-11 | Discharge: 2014-11-12 | Disposition: A | Discharge status: Home / Self Care | Payer: Medicaid Other | Attending: Emergency Medicine | Admitting: Emergency Medicine

## 2014-11-11 ENCOUNTER — Encounter (HOSPITAL_COMMUNITY): Payer: Self-pay | Admitting: Emergency Medicine

## 2014-11-11 DIAGNOSIS — Z8669 Personal history of other diseases of the nervous system and sense organs: Secondary | ICD-10-CM | POA: Insufficient documentation

## 2014-11-11 DIAGNOSIS — Z3202 Encounter for pregnancy test, result negative: Secondary | ICD-10-CM | POA: Diagnosis not present

## 2014-11-11 DIAGNOSIS — Z8659 Personal history of other mental and behavioral disorders: Secondary | ICD-10-CM | POA: Insufficient documentation

## 2014-11-11 DIAGNOSIS — Z79899 Other long term (current) drug therapy: Secondary | ICD-10-CM | POA: Diagnosis not present

## 2014-11-11 DIAGNOSIS — R011 Cardiac murmur, unspecified: Secondary | ICD-10-CM | POA: Insufficient documentation

## 2014-11-11 DIAGNOSIS — R404 Transient alteration of awareness: Secondary | ICD-10-CM

## 2014-11-11 DIAGNOSIS — Z9181 History of falling: Secondary | ICD-10-CM | POA: Insufficient documentation

## 2014-11-11 DIAGNOSIS — R4182 Altered mental status, unspecified: Secondary | ICD-10-CM | POA: Diagnosis present

## 2014-11-11 LAB — I-STAT TROPONIN, ED: Troponin i, poc: 0.01 ng/mL (ref 0.00–0.08)

## 2014-11-11 NOTE — ED Notes (Signed)
Coloring books and crayons given to patient and caregivers.

## 2014-11-11 NOTE — ED Notes (Signed)
Pt arrives via [facility] from a group home. Group home called out stating that patient was standing and all of a sudden slid down to the floor onto her bottom, initially reported that patient had slurred speech but later told ems that pts slurred speech is baseline since she started a new medication. Pt has hx of seizures but was not post ictal upon ems arrival. Pt alert, refusing to talk to this RN right now, this is not unusual for patient to refuse to communicate.

## 2014-11-11 NOTE — ED Provider Notes (Signed)
CSN: [id number]     Arrival date & time 11/11/14  2237 History   First MD Initiated Contact with Patient 11/11/14 2248     Chief Complaint  Patient presents with  . Altered Mental Status     (Consider location/radiation/quality/duration/timing/severity/associated sxs/prior Treatment) Patient is a 49 y.o. female presenting with general illness.  Illness Location:  Generalized Quality:  Fatigue, unable to stand Severity:  Moderate Onset quality:  Gradual Duration: brief. Timing:  Constant Progression:  Unchanged Chronicity:  New Context:  Spontaneous Relieved by:  Nothing Worsened by:  Nothing Associated symptoms: no abdominal pain, no cough, no fever, no nausea, no rhinorrhea, no shortness of breath and no vomiting     Past Medical History  Diagnosis Date  . Mental retardation   . Schizophrenia   . Fall   . Tardive dyskinesia   . Heart murmur   . Seizures    History reviewed. No pertinent past surgical history. Family History  Problem Relation Age of Onset  . Osteoarthritis Mother   . Diabetes Father    History  Substance Use Topics  . Smoking status: Never Smoker   . Smokeless tobacco: Not on file  . Alcohol Use: No   OB History    No data available     Review of Systems  Constitutional: Negative for fever.  HENT: Negative for rhinorrhea.   Respiratory: Negative for cough and shortness of breath.   Gastrointestinal: Negative for nausea, vomiting and abdominal pain.  All other systems reviewed and are negative.     Allergies  Review of patient's allergies indicates no known allergies.  Home Medications   Prior to Admission medications   Medication Sig Start Date End Date Taking? Authorizing Provider  alendronate (FOSAMAX) 70 MG tablet Take 70 mg by mouth every 7 (seven) days. Take with a full glass of water on an empty stomach every Thursday.   Yes Historical Provider, MD  Calcium Carbonate-Vitamin D (OSCAL 500/200 D-3 PO) Take 1 tablet by mouth  daily.   Yes Historical Provider, MD  desmopressin (DDAVP NASAL) 0.01 % solution Place 10 mcg into the nose every 3 (three) days.    Yes Historical Provider, MD  divalproex (DEPAKOTE ER) 500 MG 24 hr tablet Take 3 tablets (1,500 mg total) by mouth at bedtime. 05/15/12  Yes Nita Sells, MD  docusate sodium (COLACE) 100 MG capsule Take 100 mg by mouth daily.   Yes Historical Provider, MD  Emollient (EUCERIN) lotion Apply topically 2 (two) times daily.   Yes Historical Provider, MD  Ferrous Sulfate 143 (45 FE) MG TBCR Take 1 tablet by mouth daily.   Yes Historical Provider, MD  hydrOXYzine (ATARAX/VISTARIL) 50 MG tablet Take 50 mg by mouth 3 (three) times daily.   Yes Historical Provider, MD  levothyroxine (SYNTHROID, LEVOTHROID) 50 MCG tablet Take 50 mcg by mouth daily.   Yes Historical Provider, MD  loratadine (LORADAMED) 10 MG tablet Take 10 mg by mouth daily.   Yes Historical Provider, MD  mirtazapine (REMERON) 15 MG tablet Take 15 mg by mouth at bedtime.   Yes Historical Provider, MD  Multiple Vitamins-Minerals (THERATRUM COMPLETE PO) Take 1 tablet by mouth daily.   Yes Historical Provider, MD  QUEtiapine (SEROQUEL) 300 MG tablet Take 300 mg by mouth at bedtime.   Yes Historical Provider, MD  traZODone (DESYREL) 100 MG tablet Take 300 mg by mouth at bedtime.    Yes Historical Provider, MD  HYDROcodone-acetaminophen (NORCO/VICODIN) 5-325 MG per tablet Take 0.5 tablets by  mouth every 4 (four) hours as needed for moderate pain. Patient not taking: Reported on 11/12/2014 06/25/13   Orpah Greek, MD   BP 130/79 mmHg  Pulse 92  Temp(Src) 98.6 F (37 C) (Oral)  Resp 20  SpO2 93% Physical Exam  Constitutional: She appears well-developed and well-nourished.  HENT:  Head: Normocephalic and atraumatic.  Right Ear: External ear normal.  Left Ear: External ear normal.  Eyes: Conjunctivae and EOM are normal. Pupils are equal, round, and reactive to light.  Neck: Normal range of motion.  Neck supple.  Cardiovascular: Normal rate, regular rhythm, normal heart sounds and intact distal pulses.   Pulmonary/Chest: Effort normal and breath sounds normal.  Abdominal: Soft. Bowel sounds are normal. There is no tenderness.  Musculoskeletal: Normal range of motion.  Neurological: She is alert.  MAE  Skin: Skin is warm and dry.  Vitals reviewed.   ED Course  Procedures (including critical care time) Labs Review Labs Reviewed  URINALYSIS, ROUTINE W REFLEX MICROSCOPIC (NOT AT [facility]) - Abnormal; Notable for the following:    Leukocytes, UA TRACE (*)    All other components within normal limits  CBC WITH DIFFERENTIAL/PLATELET - Abnormal; Notable for the following:    RBC 2.62 (*)    Hemoglobin 8.3 (*)    HCT 25.6 (*)    RDW 17.5 (*)    Platelets 50 (*)    Monocytes Relative 18 (*)    Monocytes Absolute 1.2 (*)    All other components within normal limits  BASIC METABOLIC PANEL - Abnormal; Notable for the following:    Creatinine, Ser 1.40 (*)    Calcium 8.1 (*)    GFR calc non Af Amer 43 (*)    GFR calc Af Amer 49 (*)    All other components within normal limits  VALPROIC ACID LEVEL - Abnormal; Notable for the following:    Valproic Acid Lvl 104 (*)    All other components within normal limits  URINE MICROSCOPIC-ADD ON  POC URINE PREG, ED  I-STAT TROPOININ, ED  I-STAT TROPOININ, ED  POC OCCULT BLOOD, ED    Imaging Review No results found.   EKG Interpretation   Date/Time:  Sunday November 11 2014 23:25:44 EDT Ventricular Rate:  90 PR Interval:    QRS Duration: 79 QT Interval:  381 QTC Calculation: 466 R Axis:   62 Text Interpretation:  Normal sinus rhythm RSR' in V1 or V2, probably  normal variant No significant change since last tracing Confirmed by  Debby Freiberg [phone]) on 11/12/2014 12:54:55 AM      MDM   Final diagnoses:  Transient alteration of awareness    49 y.o. female with pertinent PMH of MR, schizophrenia, recent hypokalemia presents after  she felt tired and had to sit down.  No trauma, seizure like activity, or seizure.  Physical exam today benign.  History and exam extremely limited by pt's baseline mental status, which she is at now.  Caregiver was present throughout episode, however, and pt was asymptomatic prior to event.    Wu as above significant for anemia, however pt had baseline anemia on prior labs.  Given abrupt onset of symptoms, do not feel that this demonstrates a likely etiology for the episode.  Will have the pt fu closely with PCP for recheck, as last cbc obtained was some time ago.  DC home in stable condition.  I have reviewed all laboratory and imaging studies if ordered as above  1. Transient alteration of awareness  Debby Freiberg, MD 11/15/14 [phone]

## 2014-11-12 LAB — BASIC METABOLIC PANEL
Anion gap: 9 (ref 5–15)
BUN: 20 mg/dL (ref 6–20)
CALCIUM: 8.1 mg/dL — AB (ref 8.9–10.3)
CO2: 25 mmol/L (ref 22–32)
Chloride: 101 mmol/L (ref 101–111)
Creatinine, Ser: 1.4 mg/dL — ABNORMAL HIGH (ref 0.44–1.00)
GFR calc Af Amer: 49 mL/min — ABNORMAL LOW (ref >60)
GFR, EST NON AFRICAN AMERICAN: 43 mL/min — AB (ref >60)
Glucose, Bld: 91 mg/dL (ref 65–99)
Potassium: 3.6 mmol/L (ref 3.5–5.1)
SODIUM: 135 mmol/L (ref 135–145)

## 2014-11-12 LAB — CBC WITH DIFFERENTIAL/PLATELET
BASOS ABS: 0 10*3/uL (ref 0.0–0.1)
Basophils Relative: 0 % (ref 0–1)
Eosinophils Absolute: 0 10*3/uL (ref 0.0–0.7)
Eosinophils Relative: 0 % (ref 0–5)
HEMATOCRIT: 25.6 % — AB (ref 36.0–46.0)
Hemoglobin: 8.3 g/dL — ABNORMAL LOW (ref 12.0–15.0)
LYMPHS ABS: 2.1 10*3/uL (ref 0.7–4.0)
LYMPHS PCT: 33 % (ref 12–46)
MCH: 31.7 pg (ref 26.0–34.0)
MCHC: 32.4 g/dL (ref 30.0–36.0)
MCV: 97.7 fL (ref 78.0–100.0)
MONOS PCT: 18 % — AB (ref 3–12)
Monocytes Absolute: 1.2 10*3/uL — ABNORMAL HIGH (ref 0.1–1.0)
Neutro Abs: 3.1 10*3/uL (ref 1.7–7.7)
Neutrophils Relative %: 49 % (ref 43–77)
PLATELETS: 50 10*3/uL — AB (ref 150–400)
RBC: 2.62 MIL/uL — ABNORMAL LOW (ref 3.87–5.11)
RDW: 17.5 % — AB (ref 11.5–15.5)
WBC: 6.5 10*3/uL (ref 4.0–10.5)

## 2014-11-12 LAB — URINALYSIS, ROUTINE W REFLEX MICROSCOPIC
Bilirubin Urine: NEGATIVE
GLUCOSE, UA: NEGATIVE mg/dL
Hgb urine dipstick: NEGATIVE
Ketones, ur: NEGATIVE mg/dL
Nitrite: NEGATIVE
PH: 7 (ref 5.0–8.0)
PROTEIN: NEGATIVE mg/dL
Specific Gravity, Urine: 1.006 (ref 1.005–1.030)
Urobilinogen, UA: 0.2 mg/dL (ref 0.0–1.0)

## 2014-11-12 LAB — I-STAT TROPONIN, ED: Troponin i, poc: 0.01 ng/mL (ref 0.00–0.08)

## 2014-11-12 LAB — VALPROIC ACID LEVEL: Valproic Acid Lvl: 104 ug/mL — ABNORMAL HIGH (ref 50.0–100.0)

## 2014-11-12 LAB — URINE MICROSCOPIC-ADD ON

## 2014-11-12 LAB — POC OCCULT BLOOD, ED: Fecal Occult Bld: NEGATIVE

## 2014-11-12 LAB — POC URINE PREG, ED: PREG TEST UR: NEGATIVE

## 2014-11-12 NOTE — Discharge Instructions (Signed)

## 2014-11-12 NOTE — ED Notes (Signed)
Pt. Left with all belongings and refused wheelchair 

## 2014-11-23 ENCOUNTER — Telehealth: Payer: Self-pay | Admitting: Oncology

## 2014-11-23 NOTE — Telephone Encounter (Signed)
NEW PATIENT APPT-S/W LEOINA ALLEN AN GAVE NP APPT FOR 08/05 @ 1:30 W/DR. [place]

## 2014-11-30 ENCOUNTER — Encounter (HOSPITAL_COMMUNITY): Payer: Self-pay | Admitting: *Deleted

## 2014-11-30 ENCOUNTER — Emergency Department (HOSPITAL_COMMUNITY): Payer: Medicaid Other

## 2014-11-30 ENCOUNTER — Emergency Department (HOSPITAL_COMMUNITY)
Admission: EM | Admit: 2014-11-30 | Discharge: 2014-11-30 | Disposition: A | Discharge status: Home / Self Care | Payer: Medicaid Other | Attending: Emergency Medicine | Admitting: Emergency Medicine

## 2014-11-30 DIAGNOSIS — R4182 Altered mental status, unspecified: Secondary | ICD-10-CM | POA: Diagnosis not present

## 2014-11-30 DIAGNOSIS — R011 Cardiac murmur, unspecified: Secondary | ICD-10-CM | POA: Diagnosis not present

## 2014-11-30 DIAGNOSIS — Z79899 Other long term (current) drug therapy: Secondary | ICD-10-CM | POA: Diagnosis not present

## 2014-11-30 DIAGNOSIS — Z8669 Personal history of other diseases of the nervous system and sense organs: Secondary | ICD-10-CM | POA: Insufficient documentation

## 2014-11-30 DIAGNOSIS — D696 Thrombocytopenia, unspecified: Secondary | ICD-10-CM | POA: Insufficient documentation

## 2014-11-30 DIAGNOSIS — Z87828 Personal history of other (healed) physical injury and trauma: Secondary | ICD-10-CM | POA: Diagnosis not present

## 2014-11-30 DIAGNOSIS — R7989 Other specified abnormal findings of blood chemistry: Secondary | ICD-10-CM | POA: Diagnosis present

## 2014-11-30 DIAGNOSIS — R630 Anorexia: Secondary | ICD-10-CM | POA: Insufficient documentation

## 2014-11-30 LAB — CBC WITH DIFFERENTIAL/PLATELET
Basophils Absolute: 0 10*3/uL (ref 0.0–0.1)
Basophils Relative: 0 % (ref 0–1)
Eosinophils Absolute: 0 10*3/uL (ref 0.0–0.7)
Eosinophils Relative: 0 % (ref 0–5)
HEMATOCRIT: 29.6 % — AB (ref 36.0–46.0)
HEMOGLOBIN: 9.3 g/dL — AB (ref 12.0–15.0)
LYMPHS PCT: 29 % (ref 12–46)
Lymphs Abs: 2.3 10*3/uL (ref 0.7–4.0)
MCH: 32 pg (ref 26.0–34.0)
MCHC: 31.4 g/dL (ref 30.0–36.0)
MCV: 101.7 fL — ABNORMAL HIGH (ref 78.0–100.0)
MONOS PCT: 13 % — AB (ref 3–12)
Monocytes Absolute: 1 10*3/uL (ref 0.1–1.0)
NEUTROS ABS: 4.6 10*3/uL (ref 1.7–7.7)
NEUTROS PCT: 58 % (ref 43–77)
Platelets: 37 10*3/uL — ABNORMAL LOW (ref 150–400)
RBC: 2.91 MIL/uL — AB (ref 3.87–5.11)
RDW: 18.5 % — ABNORMAL HIGH (ref 11.5–15.5)
WBC: 7.8 10*3/uL (ref 4.0–10.5)

## 2014-11-30 LAB — COMPREHENSIVE METABOLIC PANEL
ALT: 13 U/L — ABNORMAL LOW (ref 14–54)
AST: 25 U/L (ref 15–41)
Albumin: 2.4 g/dL — ABNORMAL LOW (ref 3.5–5.0)
Alkaline Phosphatase: 54 U/L (ref 38–126)
Anion gap: 9 (ref 5–15)
BUN: 16 mg/dL (ref 6–20)
CO2: 22 mmol/L (ref 22–32)
Calcium: 8 mg/dL — ABNORMAL LOW (ref 8.9–10.3)
Chloride: 105 mmol/L (ref 101–111)
Creatinine, Ser: 1.39 mg/dL — ABNORMAL HIGH (ref 0.44–1.00)
GFR calc non Af Amer: 43 mL/min — ABNORMAL LOW (ref >60)
GFR, EST AFRICAN AMERICAN: 50 mL/min — AB (ref >60)
GLUCOSE: 100 mg/dL — AB (ref 65–99)
POTASSIUM: 3.7 mmol/L (ref 3.5–5.1)
SODIUM: 136 mmol/L (ref 135–145)
TOTAL PROTEIN: 5.4 g/dL — AB (ref 6.5–8.1)
Total Bilirubin: 0.5 mg/dL (ref 0.3–1.2)

## 2014-11-30 MED ORDER — METHYLPREDNISOLONE SODIUM SUCC 125 MG IJ SOLR
125.0000 mg | Freq: Once | INTRAMUSCULAR | Status: AC
  Administered 2014-11-30: 125 mg via INTRAVENOUS
  Filled 2014-11-30: qty 2

## 2014-11-30 MED ORDER — PREDNISONE 20 MG PO TABS: 60.0000 mg | ORAL_TABLET | Freq: Every day | ORAL | Status: DC

## 2014-11-30 NOTE — ED Provider Notes (Signed)
CSN: [id number]     Arrival date & time 11/30/14  1738 History   First MD Initiated Contact with Patient 11/30/14 1945     Chief Complaint  Patient presents with  . Abnormal Lab     (Consider location/radiation/quality/duration/timing/severity/associated sxs/prior Treatment) HPI Comments: Patient was taken to primary care provider today because of for nosebleeds yesterday that bled for several minutes at a time.  This is unusual for this patient.  Labs were drawn, and it found that she had low platelets.  She was called to return to the emergency department for further evaluation. He does have an appointment with Dr. Alen Blew,.oncology on August 5 At this time.  She is not having any symptoms.  There is no evidence of bruising. Her caregivers state that she has had some weight loss but she's also had several medication changes by her psychiatrist. She did have a fall approximately 3 weeks ago, but has not been complaining of any headaches or ataxia  The history is provided by a caregiver.    Past Medical History  Diagnosis Date  . Mental retardation   . Schizophrenia   . Fall   . Tardive dyskinesia   . Heart murmur   . Seizures    History reviewed. No pertinent past surgical history. Family History  Problem Relation Age of Onset  . Osteoarthritis Mother   . Diabetes Father    History  Substance Use Topics  . Smoking status: Never Smoker   . Smokeless tobacco: Not on file  . Alcohol Use: No   OB History    No data available     Review of Systems  Constitutional: Positive for appetite change. Negative for fever and chills.  Respiratory: Negative for cough and shortness of breath.   Cardiovascular: Negative for chest pain and leg swelling.  Skin: Negative for color change and wound.  Neurological: Negative for headaches.  All other systems reviewed and are negative.     Allergies  Review of patient's allergies indicates no known allergies.  Home Medications    Prior to Admission medications   Medication Sig Start Date End Date Taking? Authorizing Provider  alendronate (FOSAMAX) 70 MG tablet Take 70 mg by mouth every 7 (seven) days. Take with a full glass of water on an empty stomach every Thursday.    Historical Provider, MD  Calcium Carbonate-Vitamin D (OSCAL 500/200 D-3 PO) Take 1 tablet by mouth daily.    Historical Provider, MD  desmopressin (DDAVP NASAL) 0.01 % solution Place 10 mcg into the nose every 3 (three) days.     Historical Provider, MD  divalproex (DEPAKOTE ER) 500 MG 24 hr tablet Take 3 tablets (1,500 mg total) by mouth at bedtime. 05/15/12   Nita Sells, MD  docusate sodium (COLACE) 100 MG capsule Take 100 mg by mouth daily.    Historical Provider, MD  Emollient (EUCERIN) lotion Apply topically 2 (two) times daily.    Historical Provider, MD  Ferrous Sulfate 143 (45 FE) MG TBCR Take 1 tablet by mouth daily.    Historical Provider, MD  HYDROcodone-acetaminophen (NORCO/VICODIN) 5-325 MG per tablet Take 0.5 tablets by mouth every 4 (four) hours as needed for moderate pain. Patient not taking: Reported on 11/12/2014 06/25/13   Orpah Greek, MD  hydrOXYzine (ATARAX/VISTARIL) 50 MG tablet Take 50 mg by mouth 3 (three) times daily.    Historical Provider, MD  levothyroxine (SYNTHROID, LEVOTHROID) 50 MCG tablet Take 50 mcg by mouth daily.    Historical Provider, MD  loratadine (LORADAMED) 10 MG tablet Take 10 mg by mouth daily.    Historical Provider, MD  mirtazapine (REMERON) 15 MG tablet Take 15 mg by mouth at bedtime.    Historical Provider, MD  Multiple Vitamins-Minerals (THERATRUM COMPLETE PO) Take 1 tablet by mouth daily.    Historical Provider, MD  QUEtiapine (SEROQUEL) 300 MG tablet Take 300 mg by mouth at bedtime.    Historical Provider, MD  traZODone (DESYREL) 100 MG tablet Take 300 mg by mouth at bedtime.     Historical Provider, MD   BP 141/73 mmHg  Pulse 102  Temp(Src) 97.5 F (36.4 C) (Oral)  Resp 16  SpO2  96% Physical Exam  Constitutional: She appears well-developed and well-nourished. No distress.  HENT:  Head: Normocephalic and atraumatic.  Eyes: Pupils are equal, round, and reactive to light.  Neck: Normal range of motion.  Cardiovascular: Normal rate.   Pulmonary/Chest: Effort normal.  Musculoskeletal: Normal range of motion. She exhibits no edema or tenderness.  Neurological: She is alert.  Skin: No erythema.  Nursing note and vitals reviewed.   ED Course  Procedures (including critical care time) Labs Review Labs Reviewed  CBC WITH DIFFERENTIAL/PLATELET - Abnormal; Notable for the following:    RBC 2.91 (*)    Hemoglobin 9.3 (*)    HCT 29.6 (*)    MCV 101.7 (*)    RDW 18.5 (*)    Platelets 37 (*)    Monocytes Relative 13 (*)    All other components within normal limits  COMPREHENSIVE METABOLIC PANEL - Abnormal; Notable for the following:    Glucose, Bld 100 (*)    Creatinine, Ser 1.39 (*)    Calcium 8.0 (*)    Total Protein 5.4 (*)    Albumin 2.4 (*)    ALT 13 (*)    GFR calc non Af Amer 43 (*)    GFR calc Af Amer 50 (*)    All other components within normal limits    Imaging Review No results found.   EKG Interpretation None     I spoke with oncologist who recommends giving Solu-Medrol 25 mg IV tonight.  Prescription for prednisone 1 mg/kg daily.  The office will call her caregivers on Monday to set an appointment to be seen Monday or Tuesday Head CT reviewed reviewed.  This was obtained because of recent fall and low platelets is normal MDM   Final diagnoses:  Altered mental status         Junius Creamer, NP 11/30/14 4142  Ernestina Patches, MD 12/01/14 [phone]

## 2014-11-30 NOTE — Discharge Instructions (Signed)
I spoke with the oncologist tonight.  He recommends that Diamond Bowman take prednisone on a daily basis.  You've been given a prescription for 60 mg of prednisone daily.  Please start this in the morning with breakfast.  They will also call you on Monday to set an appointment to be seen Monday or Tuesday

## 2014-11-30 NOTE — ED Notes (Signed)
Pt sent here with caregivers for abnormal labs. Pt went to dr office today due nosebleed x 4 yesterday. Sent here due to platelets 51. No acute distress noted at triage.

## 2014-12-28 ENCOUNTER — Ambulatory Visit: Payer: Medicaid Other

## 2014-12-28 ENCOUNTER — Ambulatory Visit (HOSPITAL_BASED_OUTPATIENT_CLINIC_OR_DEPARTMENT_OTHER): Payer: Medicaid Other | Admitting: Oncology

## 2014-12-28 ENCOUNTER — Encounter: Payer: Self-pay | Admitting: Oncology

## 2014-12-28 ENCOUNTER — Telehealth: Payer: Self-pay | Admitting: Oncology

## 2014-12-28 ENCOUNTER — Other Ambulatory Visit (HOSPITAL_BASED_OUTPATIENT_CLINIC_OR_DEPARTMENT_OTHER): Payer: Medicaid Other

## 2014-12-28 VITALS — BP 127/39 | HR 100 | Resp 18 | Ht <= 58 in | Wt 120.4 lb

## 2014-12-28 DIAGNOSIS — D696 Thrombocytopenia, unspecified: Secondary | ICD-10-CM | POA: Diagnosis present

## 2014-12-28 DIAGNOSIS — F209 Schizophrenia, unspecified: Secondary | ICD-10-CM

## 2014-12-28 DIAGNOSIS — D649 Anemia, unspecified: Secondary | ICD-10-CM | POA: Diagnosis not present

## 2014-12-28 LAB — CHCC SMEAR

## 2014-12-28 LAB — CBC WITH DIFFERENTIAL/PLATELET
BASO%: 0.3 % (ref 0.0–2.0)
BASOS ABS: 0 10*3/uL (ref 0.0–0.1)
EOS%: 0 % (ref 0.0–7.0)
Eosinophils Absolute: 0 10*3/uL (ref 0.0–0.5)
HEMATOCRIT: 30.4 % — AB (ref 34.8–46.6)
HGB: 9.6 g/dL — ABNORMAL LOW (ref 11.6–15.9)
LYMPH#: 1.4 10*3/uL (ref 0.9–3.3)
LYMPH%: 33.8 % (ref 14.0–49.7)
MCH: 33.3 pg (ref 25.1–34.0)
MCHC: 31.7 g/dL (ref 31.5–36.0)
MCV: 105 fL — ABNORMAL HIGH (ref 79.5–101.0)
MONO#: 0.6 10*3/uL (ref 0.1–0.9)
MONO%: 14.1 % — ABNORMAL HIGH (ref 0.0–14.0)
NEUT%: 51.8 % (ref 38.4–76.8)
NEUTROS ABS: 2.2 10*3/uL (ref 1.5–6.5)
Platelets: 39 10*3/uL — ABNORMAL LOW (ref 145–400)
RBC: 2.9 10*6/uL — ABNORMAL LOW (ref 3.70–5.45)
RDW: 17.2 % — ABNORMAL HIGH (ref 11.2–14.5)
WBC: 4.3 10*3/uL (ref 3.9–10.3)

## 2014-12-28 NOTE — Progress Notes (Signed)
CBC on 12/28/2014 was reviewed and showed a hemoglobin of 9.6 with an MCV 105 and apply count of 39. Peripheral smear was personally reviewed and showed some monocytosis and dysplastic neutrophils. Differential diagnosis still remain as ITP but certainly myelodysplastic syndrome as a distinct possibility. Reviewing her counts in the last 7 years she had that chronic thrombocytopenia dating back to 2009 with plaque on close to 70000. Her macrocytosis was noted that back in 2010.  These hematological abnormalities can also be noted with second triple medication such as Zyprexa and Depakote.  The ultimate test to rule out myelodysplasia would be a bone marrow biopsy. Her caregiver will be in discussion with the patient's mother to obtain her consent in the near future. I do not see any evidence of acute leukemia but certainly ruling that out would be necessary in the near future.

## 2014-12-28 NOTE — Consult Note (Signed)
Reason for Referral: Thrombocytopenia.   HPI: 49 year old woman with history of mental retardation, schizophrenia among other comorbid conditions. She is accompanied today by her care provider who she lives with for the time being. She has a family predominantly a mother that checks on her at times. She was evaluated in the emergency department on 11/30/2014 for profound thrombocytopenia. Her platelet count at that time was 37 and presented with epistaxis. Upon evaluation at that time a CBC was obtained and showed an hemoglobin of 9.3, MCV 101 and a platelet count of 37. Her chemistry showed normal electrolytes and a creatinine of 1.39. Her creatinine clearance is around 43 mL/m. Her hemoglobin was low at 2.4 and total protein was low at 5.4. She received a dose of Solu-Medrol intravenously and started on 60 mg of prednisone. She took that for 10 days and was discontinued. Since that time, per her caregiver, she has not had any bleeding episodes. But a overall decline have been noted in her health. Her appetite has been down and have lost some weight. She has had some behavioral issues and her medication been adjusted by her psychiatrist. She does report easy bruisability although that has improved as of late. Her review of system was difficult to obtain as the patient is unable to provide much history. With the help of the caregiver she denied any headaches or blurry vision. She did not have any syncope or seizures. She denied any chest pain or palpitation. Denied any shortness of breath or cough. She did not report any hemoptysis or hematemesis. Does not report any vomiting or constipation or diarrhea. She does not report any hematochezia. Remaining review of systems unremarkable.   Past Medical History  Diagnosis Date  . Mental retardation   . Schizophrenia   . Fall   . Tardive dyskinesia   . Heart murmur   . Seizures   :  No past surgical history on file.:   Current outpatient prescriptions:   .  alendronate (FOSAMAX) 70 MG tablet, Take 70 mg by mouth every 7 (seven) days. Take with a full glass of water on an empty stomach every Thursday., Disp: , Rfl:  .  Calcium Carbonate-Vitamin D (OSCAL 500/200 D-3 PO), Take 1 tablet by mouth daily., Disp: , Rfl:  .  divalproex (DEPAKOTE ER) 500 MG 24 hr tablet, Take 3 tablets (1,500 mg total) by mouth at bedtime., Disp: , Rfl:  .  docusate sodium (COLACE) 100 MG capsule, Take 100 mg by mouth daily., Disp: , Rfl:  .  Emollient (EUCERIN) lotion, Apply topically 2 (two) times daily., Disp: , Rfl:  .  Ferrous Sulfate 143 (45 FE) MG TBCR, Take 1 tablet by mouth daily., Disp: , Rfl:  .  hydrOXYzine (VISTARIL) 50 MG capsule, Take 50 mg by mouth 3 (three) times daily., Disp: , Rfl:  .  levothyroxine (SYNTHROID, LEVOTHROID) 50 MCG tablet, Take 50 mcg by mouth daily., Disp: , Rfl:  .  loratadine (LORADAMED) 10 MG tablet, Take 10 mg by mouth daily., Disp: , Rfl:  .  mirtazapine (REMERON) 15 MG tablet, Take 15 mg by mouth at bedtime., Disp: , Rfl:  .  Multiple Vitamins-Minerals (THERATRUM COMPLETE PO), Take 1 tablet by mouth daily., Disp: , Rfl:  .  olanzapine zydis (ZYPREXA) 15 MG disintegrating tablet, Take 15 mg by mouth at bedtime., Disp: , Rfl:  .  traZODone (DESYREL) 100 MG tablet, Take 300 mg by mouth at bedtime. , Disp: , Rfl:  .  HYDROcodone-acetaminophen (NORCO/VICODIN) 5-325  MG per tablet, Take 0.5 tablets by mouth every 4 (four) hours as needed for moderate pain. (Patient not taking: Reported on 11/12/2014), Disp: 20 tablet, Rfl: 0 .  predniSONE (DELTASONE) 20 MG tablet, Take 3 tablets (60 mg total) by mouth daily with breakfast., Disp: 30 tablet, Rfl: 0:  No Known Allergies:  Family History  Problem Relation Age of Onset  . Osteoarthritis Mother   . Diabetes Father   :  History   Social History  . Marital Status: Single    Spouse Name: N/A  . Number of Children: N/A  . Years of Education: N/A   Occupational History  . Not on file.    Social History Main Topics  . Smoking status: Never Smoker   . Smokeless tobacco: Not on file  . Alcohol Use: No  . Drug Use: No  . Sexual Activity: Not on file   Other Topics Concern  . Not on file   Social History Narrative   Patient has been living at her current arrangements with Mrs. Docia Barrier at telephone number [phone]   Mother can be reached at [phone]   Patient is pretty much total care-she is incontinent of urine and stool.  :  Pertinent items are noted in HPI.  Exam: Blood pressure 127/39, pulse 100, resp. rate 18, height 4' 9" (1.448 m), weight 120 lb 6.4 oz (54.613 kg), SpO2 98 %. General appearance: alert and awake woman did not appear in any distress. Head: Normocephalic, without obvious abnormality, atraumatic Throat: lips, mucosa, and tongue normal; teeth and gums normal Neck: no adenopathy Back: negative Resp: clear to auscultation bilaterally Chest wall: no tenderness Cardio: regular rate and rhythm, S1, S2 normal, no murmur, click, rub or gallop Extremities: extremities normal, atraumatic, no cyanosis or edema Pulses: 2+ and symmetric Skin: pale with very little ecchymosis. Lymph nodes: Cervical, supraclavicular, and axillary nodes normal.  CBC    Component Value Date/Time   WBC 7.8 11/30/2014 1812   WBC 7.1 04/25/2009 1442   RBC 2.91* 11/30/2014 1812   RBC 3.61* 04/25/2009 1442   HGB 9.3* 11/30/2014 1812   HGB 11.9 04/25/2009 1442   HCT 29.6* 11/30/2014 1812   HCT 36.5 04/25/2009 1442   PLT 37* 11/30/2014 1812   PLT 88* 04/25/2009 1442   MCV 101.7* 11/30/2014 1812   MCV 101.1* 04/25/2009 1442   MCH 32.0 11/30/2014 1812   MCH 33.0 04/25/2009 1442   MCHC 31.4 11/30/2014 1812   MCHC 32.6 04/25/2009 1442   RDW 18.5* 11/30/2014 1812   RDW 14.3 04/25/2009 1442   LYMPHSABS 2.3 11/30/2014 1812   LYMPHSABS 2.8 04/25/2009 1442   MONOABS 1.0 11/30/2014 1812   MONOABS 0.6 04/25/2009 1442   EOSABS 0.0 11/30/2014 1812   EOSABS 0.1 04/25/2009 1442    BASOSABS 0.0 11/30/2014 1812   BASOSABS 0.0 04/25/2009 1442      Chemistry      Component Value Date/Time   NA 136 11/30/2014 1812   K 3.7 11/30/2014 1812   CL 105 11/30/2014 1812   CO2 22 11/30/2014 1812   BUN 16 11/30/2014 1812   CREATININE 1.39* 11/30/2014 1812      Component Value Date/Time   CALCIUM 8.0* 11/30/2014 1812   ALKPHOS 54 11/30/2014 1812   AST 25 11/30/2014 1812   ALT 13* 11/30/2014 1812   BILITOT 0.5 11/30/2014 1812       Ct Head Wo Contrast  11/30/2014   CLINICAL DATA:  49 year old female with altered mental status and slurred  speech.  EXAM: CT HEAD WITHOUT CONTRAST  TECHNIQUE: Contiguous axial images were obtained from the base of the skull through the vertex without intravenous contrast.  COMPARISON:  06/25/2013 and prior CT  FINDINGS: Mild generalized volume loss noted.  No acute intracranial abnormalities are identified, including mass lesion or mass effect, hydrocephalus, extra-axial fluid collection, midline shift, hemorrhage, or acute infarction.  The visualized bony calvarium is unremarkable.  Mucous retention cyst/ polyp within the left maxillary sinus again noted.  IMPRESSION: No evidence of acute intracranial abnormality.   Electronically Signed   By: Margarette Canada M.D.   On: 11/30/2014 20:32    Assessment and Plan:    49 year old woman with the following issues:  1. Thrombocytopenia: The differential diagnosis was discussed with her care provider. 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 I do not have a repeat his CBC since her July the 8th. We will check her CBC today and make recommendations regarding the next step of management. If we are indeed dealing with ITP that has been adequately treated, no further therapy would be needed. If her platelet counts have drifted down after initial treatment to prednisone, I will start her back on 60 mg with slower taper.  Other possibilities include hematological disorder such as MDS,  leukemia, medications among others. TTP, HUS and DIC are unlikely.  I have discussed the possibility of obtaining a bone marrow biopsy to work these findings up if needed to. The logistics of this procedure was discussed with her care provider. Risks and benefits were also reviewed. This will be scheduled in the future if needed to is a step of evaluation.  2. Macrocytic anemia: This could be anemia of chronic disease related to medications. Could be also sign of myelodysplasia or plasma cell disorder. If no clear-cut answer is obvious, a bone marrow biopsy would be needed as discussed above.  A follow-up in the next few weeks to discuss the results of her workup will be scheduled today.

## 2014-12-28 NOTE — Progress Notes (Signed)
I checked in patient with caregiver ms. Liona. She has not traveled and Ms Charlean Merl signed for the patient(special needs).

## 2014-12-28 NOTE — Telephone Encounter (Signed)
per po fto sch pt appt-gave pt copy of avs-sent back to lab °

## 2014-12-28 NOTE — Progress Notes (Signed)
Please see consult note.  

## 2015-01-01 NOTE — Progress Notes (Signed)
Laboratory results from her primary care's office as well as laboratory testing from 12/28/2014 were reviewed and discussed with the patient's care provider. It appears that her macrocytosis and thrombocytopenia was not responsive to sterilize therapy. This could be a sign of a bone marrow disease such as myelodysplasia or leukemia or the fact that sterilize might have been discontinued too early.  I feel the best way to evaluate her condition, she will need a bone marrow biopsy. This was discussed with Meriel Flavors; the patient caregiver (home phone [phone]). She is agreeable but she will need the consent signed by the patient's biological mother. I will refer the patient for interventional radiology to perform this biopsy in the near future.

## 2015-01-01 NOTE — Addendum Note (Signed)
Addended by: Wyatt Portela on: 01/01/2015 03:52 PM   Modules accepted: Orders

## 2015-01-03 ENCOUNTER — Encounter (HOSPITAL_COMMUNITY): Payer: Self-pay | Admitting: *Deleted

## 2015-01-03 ENCOUNTER — Emergency Department (HOSPITAL_COMMUNITY): Payer: Medicaid Other

## 2015-01-03 ENCOUNTER — Observation Stay (HOSPITAL_COMMUNITY)
Admission: EM | Admit: 2015-01-03 | Discharge: 2015-01-04 | Disposition: A | Discharge status: Home / Self Care | Payer: Medicaid Other | Attending: Internal Medicine | Admitting: Internal Medicine

## 2015-01-03 DIAGNOSIS — E039 Hypothyroidism, unspecified: Secondary | ICD-10-CM | POA: Diagnosis present

## 2015-01-03 DIAGNOSIS — R531 Weakness: Secondary | ICD-10-CM

## 2015-01-03 DIAGNOSIS — Z9181 History of falling: Secondary | ICD-10-CM | POA: Insufficient documentation

## 2015-01-03 DIAGNOSIS — F209 Schizophrenia, unspecified: Secondary | ICD-10-CM | POA: Diagnosis present

## 2015-01-03 DIAGNOSIS — T43595A Adverse effect of other antipsychotics and neuroleptics, initial encounter: Secondary | ICD-10-CM | POA: Insufficient documentation

## 2015-01-03 DIAGNOSIS — W19XXXA Unspecified fall, initial encounter: Secondary | ICD-10-CM | POA: Diagnosis present

## 2015-01-03 DIAGNOSIS — G9341 Metabolic encephalopathy: Secondary | ICD-10-CM | POA: Diagnosis present

## 2015-01-03 DIAGNOSIS — F79 Unspecified intellectual disabilities: Secondary | ICD-10-CM | POA: Diagnosis not present

## 2015-01-03 DIAGNOSIS — R600 Localized edema: Secondary | ICD-10-CM | POA: Insufficient documentation

## 2015-01-03 DIAGNOSIS — N183 Chronic kidney disease, stage 3 unspecified: Secondary | ICD-10-CM

## 2015-01-03 DIAGNOSIS — G92 Toxic encephalopathy: Secondary | ICD-10-CM | POA: Diagnosis not present

## 2015-01-03 DIAGNOSIS — F3181 Bipolar II disorder: Secondary | ICD-10-CM | POA: Diagnosis present

## 2015-01-03 DIAGNOSIS — Z7952 Long term (current) use of systemic steroids: Secondary | ICD-10-CM | POA: Insufficient documentation

## 2015-01-03 DIAGNOSIS — T426X5A Adverse effect of other antiepileptic and sedative-hypnotic drugs, initial encounter: Secondary | ICD-10-CM | POA: Diagnosis not present

## 2015-01-03 DIAGNOSIS — G40909 Epilepsy, unspecified, not intractable, without status epilepticus: Secondary | ICD-10-CM | POA: Diagnosis not present

## 2015-01-03 DIAGNOSIS — R4182 Altered mental status, unspecified: Secondary | ICD-10-CM

## 2015-01-03 DIAGNOSIS — Z79899 Other long term (current) drug therapy: Secondary | ICD-10-CM | POA: Diagnosis not present

## 2015-01-03 DIAGNOSIS — G9340 Encephalopathy, unspecified: Secondary | ICD-10-CM | POA: Diagnosis present

## 2015-01-03 LAB — COMPREHENSIVE METABOLIC PANEL
ALT: 7 U/L — AB (ref 14–54)
AST: 37 U/L (ref 15–41)
Albumin: 2.3 g/dL — ABNORMAL LOW (ref 3.5–5.0)
Alkaline Phosphatase: 46 U/L (ref 38–126)
Anion gap: 6 (ref 5–15)
BUN: 15 mg/dL (ref 6–20)
CALCIUM: 8.4 mg/dL — AB (ref 8.9–10.3)
CHLORIDE: 106 mmol/L (ref 101–111)
CO2: 31 mmol/L (ref 22–32)
Creatinine, Ser: 1.26 mg/dL — ABNORMAL HIGH (ref 0.44–1.00)
GFR calc Af Amer: 56 mL/min — ABNORMAL LOW (ref >60)
GFR calc non Af Amer: 48 mL/min — ABNORMAL LOW (ref >60)
Glucose, Bld: 67 mg/dL (ref 65–99)
Potassium: 4.3 mmol/L (ref 3.5–5.1)
Sodium: 143 mmol/L (ref 135–145)
Total Bilirubin: 0.9 mg/dL (ref 0.3–1.2)
Total Protein: 5.2 g/dL — ABNORMAL LOW (ref 6.5–8.1)

## 2015-01-03 LAB — CBC
HEMATOCRIT: 32.2 % — AB (ref 36.0–46.0)
Hemoglobin: 9.9 g/dL — ABNORMAL LOW (ref 12.0–15.0)
MCH: 32.7 pg (ref 26.0–34.0)
MCHC: 30.7 g/dL (ref 30.0–36.0)
MCV: 106.3 fL — ABNORMAL HIGH (ref 78.0–100.0)
Platelets: 61 10*3/uL — ABNORMAL LOW (ref 150–400)
RBC: 3.03 MIL/uL — ABNORMAL LOW (ref 3.87–5.11)
RDW: 16.1 % — ABNORMAL HIGH (ref 11.5–15.5)
WBC: 4.4 10*3/uL (ref 4.0–10.5)

## 2015-01-03 LAB — CBG MONITORING, ED
GLUCOSE-CAPILLARY: 63 mg/dL — AB (ref 65–99)
Glucose-Capillary: 78 mg/dL (ref 65–99)
Glucose-Capillary: 129 mg/dL — ABNORMAL HIGH (ref 65–99)

## 2015-01-03 LAB — URINALYSIS, ROUTINE W REFLEX MICROSCOPIC
Bilirubin Urine: NEGATIVE
Glucose, UA: NEGATIVE mg/dL
HGB URINE DIPSTICK: NEGATIVE
KETONES UR: NEGATIVE mg/dL
Leukocytes, UA: NEGATIVE
Nitrite: NEGATIVE
Protein, ur: NEGATIVE mg/dL
SPECIFIC GRAVITY, URINE: 1.007 (ref 1.005–1.030)
Urobilinogen, UA: 0.2 mg/dL (ref 0.0–1.0)
pH: 7 (ref 5.0–8.0)

## 2015-01-03 LAB — I-STAT TROPONIN, ED: Troponin i, poc: 0.01 ng/mL (ref 0.00–0.08)

## 2015-01-03 LAB — BRAIN NATRIURETIC PEPTIDE: B Natriuretic Peptide: 187.6 pg/mL — ABNORMAL HIGH (ref 0.0–100.0)

## 2015-01-03 MED ORDER — DEXTROSE 50 % IV SOLN
50.0000 mL | Freq: Once | INTRAVENOUS | Status: AC
  Administered 2015-01-03: 50 mL via INTRAVENOUS
  Filled 2015-01-03: qty 50

## 2015-01-03 NOTE — ED Notes (Signed)
unsuccessful blood draw will ask other Phleb to try

## 2015-01-03 NOTE — ED Notes (Signed)
Per EMS pt from home, lives with a caregiver.  Reports recently having one of her med dose changed yesterday and has been having increased weakness, which is making her recurrent falls.  Pt reports feeling sleepy today.

## 2015-01-03 NOTE — ED Provider Notes (Signed)
CSN: [id number]     Arrival date & time 01/03/15  1747 History   First MD Initiated Contact with Patient 01/03/15 1759     Chief Complaint  Patient presents with  . Weakness     (Consider location/radiation/quality/duration/timing/severity/associated sxs/prior Treatment) HPI Comments: Patient had medications changed recently - increased olanzapine, added cyproheptadine and gabapentin.  Patient is a 49 y.o. female presenting with weakness. The history is provided by the patient.  Weakness This is a new problem. The current episode started more than 1 week ago. The problem occurs constantly. The problem has not changed since onset.Pertinent negatives include no chest pain, no abdominal pain and no shortness of breath. Nothing aggravates the symptoms. Nothing relieves the symptoms. She has tried nothing for the symptoms. The treatment provided no relief.    Past Medical History  Diagnosis Date  . Mental retardation   . Schizophrenia   . Fall   . Tardive dyskinesia   . Heart murmur   . Seizures    History reviewed. No pertinent past surgical history. Family History  Problem Relation Age of Onset  . Osteoarthritis Mother   . Diabetes Father    Social History  Substance Use Topics  . Smoking status: Never Smoker   . Smokeless tobacco: None  . Alcohol Use: No   OB History    No data available     Review of Systems  Constitutional: Negative for fever.  Respiratory: Negative for cough and shortness of breath.   Cardiovascular: Negative for chest pain.  Gastrointestinal: Negative for vomiting and abdominal pain.  Neurological: Positive for weakness.  All other systems reviewed and are negative.     Allergies  Review of patient's allergies indicates no known allergies.  Home Medications   Prior to Admission medications   Medication Sig Start Date End Date Taking? Authorizing Provider  alendronate (FOSAMAX) 70 MG tablet Take 70 mg by mouth every 7 (seven) days. Take  with a full glass of water on an empty stomach every Thursday.   Yes Historical Provider, MD  Calcium Carbonate-Vitamin D (OSCAL 500/200 D-3 PO) Take 1 tablet by mouth daily.   Yes Historical Provider, MD  cyproheptadine (PERIACTIN) 4 MG tablet Take 4 mg by mouth at bedtime.   Yes Historical Provider, MD  divalproex (DEPAKOTE ER) 500 MG 24 hr tablet Take 3 tablets (1,500 mg total) by mouth at bedtime. 05/15/12  Yes Nita Sells, MD  docusate sodium (COLACE) 100 MG capsule Take 100 mg by mouth daily.   Yes Historical Provider, MD  Emollient (EUCERIN) lotion Apply topically 2 (two) times daily.   Yes Historical Provider, MD  Ferrous Sulfate 143 (45 FE) MG TBCR Take 1 tablet by mouth daily.   Yes Historical Provider, MD  gabapentin (NEURONTIN) 300 MG capsule Take 300 mg by mouth 3 (three) times daily.   Yes Historical Provider, MD  hydrOXYzine (ATARAX/VISTARIL) 50 MG tablet Take 50 mg by mouth 2 (two) times daily.   Yes Historical Provider, MD  levothyroxine (SYNTHROID, LEVOTHROID) 50 MCG tablet Take 50 mcg by mouth daily.   Yes Historical Provider, MD  loratadine (LORADAMED) 10 MG tablet Take 10 mg by mouth daily.   Yes Historical Provider, MD  mirtazapine (REMERON) 15 MG tablet Take 15 mg by mouth at bedtime.   Yes Historical Provider, MD  Multiple Vitamins-Minerals (THERATRUM COMPLETE PO) Take 1 tablet by mouth daily.   Yes Historical Provider, MD  olanzapine zydis (ZYPREXA) 15 MG disintegrating tablet Take 30 mg by mouth  at bedtime.    Yes Historical Provider, MD  traZODone (DESYREL) 100 MG tablet Take 300 mg by mouth at bedtime.    Yes Historical Provider, MD  HYDROcodone-acetaminophen (NORCO/VICODIN) 5-325 MG per tablet Take 0.5 tablets by mouth every 4 (four) hours as needed for moderate pain. Patient not taking: Reported on 11/12/2014 06/25/13   Orpah Greek, MD  predniSONE (DELTASONE) 20 MG tablet Take 3 tablets (60 mg total) by mouth daily with breakfast. Patient not taking:  Reported on 01/03/2015 11/30/14   Junius Creamer, NP   BP 132/57 mmHg  Pulse 91  Temp(Src) 97.5 F (36.4 C) (Oral)  SpO2 97% Physical Exam  Constitutional: She appears well-developed and well-nourished. No distress.  HENT:  Head: Normocephalic and atraumatic.  Mouth/Throat: Oropharynx is clear and moist.  Eyes: EOM are normal. Pupils are equal, round, and reactive to light.  Neck: Normal range of motion. Neck supple.  Cardiovascular: Normal rate and regular rhythm.  Exam reveals no friction rub.   No murmur heard. Pulmonary/Chest: Effort normal and breath sounds normal. No respiratory distress. She has no wheezes. She has no rales.  Abdominal: Soft. She exhibits no distension. There is no tenderness. There is no rebound.  Musculoskeletal: Normal range of motion. She exhibits no edema.  Neurological: She is alert. No cranial nerve deficit. She exhibits normal muscle tone. Coordination normal.  Alert, persistently asking for oatmeal and grits  Skin: No rash noted. She is not diaphoretic.  Nursing note and vitals reviewed.   ED Course  Procedures (including critical care time) Labs Review Labs Reviewed  CBC  URINALYSIS, ROUTINE W REFLEX MICROSCOPIC (NOT AT [facility])  COMPREHENSIVE METABOLIC PANEL  BRAIN NATRIURETIC PEPTIDE  URINALYSIS, ROUTINE W REFLEX MICROSCOPIC (NOT AT [facility])  CBG MONITORING, ED  I-STAT TROPOININ, ED    Imaging Review No results found. Danella Sensing, personally reviewed and evaluated these images and lab results as part of my medical decision-making.   EKG Interpretation None      MDM   Final diagnoses:  Altered mental status, unspecified altered mental status type  Weakness    70F here with weakness. Caregiver here, reports weakness today, unable to get out of the bathtub. She's been having a slow decline in strength over past several weeks. Hx of thrombocytopenia recently. Here has LE edema, worse than previous per caregiver. Will check labs,  do infectious w/u. Labs unremarkable. Patient is sleepier than previous. With recent increase of olanzapine and addition of gabapentin and cyproheptadine, likely polypharmacy. Admitted.    Evelina Bucy, MD 01/04/15 2029

## 2015-01-04 ENCOUNTER — Telehealth: Payer: Self-pay

## 2015-01-04 DIAGNOSIS — N183 Chronic kidney disease, stage 3 unspecified: Secondary | ICD-10-CM

## 2015-01-04 DIAGNOSIS — F3181 Bipolar II disorder: Secondary | ICD-10-CM

## 2015-01-04 DIAGNOSIS — G9340 Encephalopathy, unspecified: Secondary | ICD-10-CM | POA: Diagnosis present

## 2015-01-04 DIAGNOSIS — G9341 Metabolic encephalopathy: Secondary | ICD-10-CM | POA: Diagnosis present

## 2015-01-04 DIAGNOSIS — F209 Schizophrenia, unspecified: Secondary | ICD-10-CM | POA: Diagnosis not present

## 2015-01-04 DIAGNOSIS — R4182 Altered mental status, unspecified: Secondary | ICD-10-CM | POA: Diagnosis not present

## 2015-01-04 HISTORY — DX: Chronic kidney disease, stage 3 unspecified: N18.30

## 2015-01-04 HISTORY — DX: Encephalopathy, unspecified: G93.40

## 2015-01-04 LAB — MAGNESIUM: Magnesium: 1.8 mg/dL (ref 1.7–2.4)

## 2015-01-04 LAB — COMPREHENSIVE METABOLIC PANEL
ALT: 11 U/L — ABNORMAL LOW (ref 14–54)
AST: 29 U/L (ref 15–41)
Albumin: 1.9 g/dL — ABNORMAL LOW (ref 3.5–5.0)
Alkaline Phosphatase: 45 U/L (ref 38–126)
Anion gap: 4 — ABNORMAL LOW (ref 5–15)
BILIRUBIN TOTAL: 0.5 mg/dL (ref 0.3–1.2)
BUN: 13 mg/dL (ref 6–20)
CO2: 31 mmol/L (ref 22–32)
Calcium: 8.1 mg/dL — ABNORMAL LOW (ref 8.9–10.3)
Chloride: 108 mmol/L (ref 101–111)
Creatinine, Ser: 1.02 mg/dL — ABNORMAL HIGH (ref 0.44–1.00)
GFR calc Af Amer: >60 mL/min (ref >60)
GFR calc non Af Amer: >60 mL/min (ref >60)
Glucose, Bld: 81 mg/dL (ref 65–99)
Potassium: 4.6 mmol/L (ref 3.5–5.1)
Sodium: 143 mmol/L (ref 135–145)
Total Protein: 4.7 g/dL — ABNORMAL LOW (ref 6.5–8.1)

## 2015-01-04 LAB — CBC
HCT: 28.7 % — ABNORMAL LOW (ref 36.0–46.0)
HEMOGLOBIN: 9 g/dL — AB (ref 12.0–15.0)
MCH: 33.1 pg (ref 26.0–34.0)
MCHC: 31.4 g/dL (ref 30.0–36.0)
MCV: 105.5 fL — AB (ref 78.0–100.0)
PLATELETS: 62 10*3/uL — AB (ref 150–400)
RBC: 2.72 MIL/uL — AB (ref 3.87–5.11)
RDW: 16.2 % — ABNORMAL HIGH (ref 11.5–15.5)
WBC: 4.7 10*3/uL (ref 4.0–10.5)

## 2015-01-04 LAB — PHOSPHORUS: PHOSPHORUS: 3.3 mg/dL (ref 2.5–4.6)

## 2015-01-04 LAB — CBG MONITORING, ED
GLUCOSE-CAPILLARY: 62 mg/dL — AB (ref 65–99)
Glucose-Capillary: 53 mg/dL — ABNORMAL LOW (ref 65–99)

## 2015-01-04 LAB — VALPROIC ACID LEVEL: Valproic Acid Lvl: 78 ug/mL (ref 50.0–100.0)

## 2015-01-04 LAB — TSH: TSH: 1.973 u[IU]/mL (ref 0.350–4.500)

## 2015-01-04 LAB — GLUCOSE, CAPILLARY: Glucose-Capillary: 79 mg/dL (ref 65–99)

## 2015-01-04 MED ORDER — LEVOTHYROXINE SODIUM 50 MCG PO TABS
50.0000 ug | ORAL_TABLET | Freq: Every day | ORAL | Status: DC
  Administered 2015-01-04: 50 ug via ORAL
  Filled 2015-01-04 (×2): qty 1

## 2015-01-04 MED ORDER — LORATADINE 10 MG PO TABS
10.0000 mg | ORAL_TABLET | Freq: Every day | ORAL | Status: DC
  Administered 2015-01-04: 10 mg via ORAL
  Filled 2015-01-04: qty 1

## 2015-01-04 MED ORDER — OLANZAPINE 15 MG PO TBDP: 15.0000 mg | ORAL_TABLET | Freq: Every day | ORAL | Status: AC

## 2015-01-04 MED ORDER — DEXTROSE 50 % IV SOLN
50.0000 mL | Freq: Once | INTRAVENOUS | Status: AC
  Administered 2015-01-04: 50 mL via INTRAVENOUS
  Filled 2015-01-04: qty 50

## 2015-01-04 MED ORDER — FERROUS SULFATE 325 (65 FE) MG PO TABS
325.0000 mg | ORAL_TABLET | Freq: Every day | ORAL | Status: DC
  Administered 2015-01-04: 325 mg via ORAL
  Filled 2015-01-04: qty 1

## 2015-01-04 MED ORDER — ENOXAPARIN SODIUM 30 MG/0.3ML ~~LOC~~ SOLN
30.0000 mg | SUBCUTANEOUS | Status: DC
  Filled 2015-01-04: qty 0.3

## 2015-01-04 MED ORDER — SODIUM CHLORIDE 0.9 % IJ SOLN
3.0000 mL | Freq: Two times a day (BID) | INTRAMUSCULAR | Status: DC
  Administered 2015-01-04: 3 mL via INTRAVENOUS

## 2015-01-04 NOTE — Discharge Summary (Signed)
Physician Discharge Summary  Diamond Bowman PZW:[id number] DOB: Nov 22, 1965 DOA: 01/03/2015  PCP: Imelda Pillow, NP  Admit date: 01/03/2015 Discharge date: 01/04/2015  Time spent: 55 minutes  Recommendations for Outpatient Follow-up:  1. Follow-up with psychiatry  Discharge Condition: Stable  Discharge Diagnoses:  Principal Problem:   Encephalopathy, metabolic Active Problems:   Bipolar 2 disorder   Fall   Hypothyroid   Schizophrenia   Chronic kidney disease, stage 3   History of present illness:  This is a 49 year old female with schizophrenia, bipolar disorder, seizures brought in by caretaker weakness somnolence and falls. This had started after her medications were adjusted by the psychiatrist 2 days before. Her olanzapine at bedtime was doubled, she was started on cyproheptadine and gabapentin.  Hospital Course:  Acute encephalopathy -Secondary to medication changes and resolved in a matter of hours after admission -Patient examined this morning and per caretaker is at her baseline -I have made the following adjustments: Decreased olanzapine back to 15 mg a day and stopped gabapentin -I have asked her caretaker to make another appointment or at least discuss with the psychiatrist over the phone what has transpired    Discharge Exam: Filed Weights   01/04/15 0137  Weight: 53.797 kg (118 lb 9.6 oz)   Filed Vitals:   01/04/15 0510  BP: 110/69  Pulse: 80  Temp: 97.5 F (36.4 C)  Resp: 16    General: AAO x 3, no distress Cardiovascular: RRR, no murmurs  Respiratory: clear to auscultation bilaterally GI: soft, non-tender, non-distended, bowel sound positive  Discharge Instructions You were cared for by a hospitalist during your hospital stay. If you have any questions about your discharge medications or the care you received while you were in the hospital after you are discharged, you can call the unit and asked to speak with the hospitalist on call if the  hospitalist that took care of you is not available. Once you are discharged, your primary care physician will handle any further medical issues. Please note that NO REFILLS for any discharge medications will be authorized once you are discharged, as it is imperative that you return to your primary care physician (or establish a relationship with a primary care physician if you do not have one) for your aftercare needs so that they can reassess your need for medications and monitor your lab values.      Discharge Instructions    Discharge instructions    Complete by:  As directed   Return to psychiatrist as soon as possible Low sodium, heart healthy diet     Increase activity slowly    Complete by:  As directed             Medication List    STOP taking these medications        gabapentin 300 MG capsule  Commonly known as:  NEURONTIN      TAKE these medications        cyproheptadine 4 MG tablet  Commonly known as:  PERIACTIN  Take 4 mg by mouth at bedtime.     divalproex 500 MG 24 hr tablet  Commonly known as:  DEPAKOTE ER  Take 3 tablets (1,500 mg total) by mouth at bedtime.     docusate sodium 100 MG capsule  Commonly known as:  COLACE  Take 100 mg by mouth daily.     eucerin lotion  Apply topically 2 (two) times daily.     Ferrous Sulfate 143 (45 FE) MG Tbcr  Take 1 tablet by mouth daily.     FOSAMAX 70 MG tablet  Generic drug:  alendronate  Take 70 mg by mouth every 7 (seven) days. Take with a full glass of water on an empty stomach every Thursday.     hydrOXYzine 50 MG tablet  Commonly known as:  ATARAX/VISTARIL  Take 50 mg by mouth 2 (two) times daily.     levothyroxine 50 MCG tablet  Commonly known as:  SYNTHROID, LEVOTHROID  Take 50 mcg by mouth daily.     LORADAMED 10 MG tablet  Generic drug:  loratadine  Take 10 mg by mouth daily.     mirtazapine 15 MG tablet  Commonly known as:  REMERON  Take 15 mg by mouth at bedtime.     olanzapine zydis 15  MG disintegrating tablet  Commonly known as:  ZYPREXA  Take 1 tablet (15 mg total) by mouth at bedtime.     OSCAL 500/200 D-3 PO  Take 1 tablet by mouth daily.     THERATRUM COMPLETE PO  Take 1 tablet by mouth daily.     traZODone 100 MG tablet  Commonly known as:  DESYREL  Take 300 mg by mouth at bedtime.       No Known Allergies    The results of significant diagnostics from this hospitalization (including imaging, microbiology, ancillary and laboratory) are listed below for reference.    Significant Diagnostic Studies: Dg Chest 2 View  01/03/2015   CLINICAL DATA:  Pt's caregiver states that she has increasing weakness due to changes in medication. Hx of heart murmur  EXAM: CHEST  2 VIEW  COMPARISON:  02/14/2008  FINDINGS: Cardiac silhouette is normal in size and configuration. No mediastinal or hilar masses or evidence of adenopathy.  There is opacity along the oblique fissure, seen on the lateral view, likely atelectasis. There is no convincing pneumonia and no pulmonary edema. No pleural effusion pneumothorax.  Skeletal structures are demineralized but grossly intact.  IMPRESSION: No acute cardiopulmonary disease.   Electronically Signed   By: Lajean Manes M.D.   On: 01/03/2015 19:34   Dg Pelvis 1-2 Views  01/03/2015   CLINICAL DATA:  Pain following fall  EXAM: PELVIS - 1-2 VIEW  COMPARISON:  None.  FINDINGS: There is no evidence of pelvic fracture or dislocation. Joint spaces appear intact. No erosive change.  IMPRESSION: No fracture or dislocation.  No appreciable arthropathy.   Electronically Signed   By: Lowella Grip III M.D.   On: 01/03/2015 21:47   Ct Head Wo Contrast  01/03/2015   CLINICAL DATA:  Pain following fall  EXAM: CT HEAD WITHOUT CONTRAST  TECHNIQUE: Contiguous axial images were obtained from the base of the skull through the vertex without intravenous contrast.  COMPARISON:  November 30, 2014  FINDINGS: The ventricles are normal in size and configuration. There is  temporal and parietal lobe atrophy bilaterally, slightly more on the left than on the right, stable. There is no intracranial mass, hemorrhage, extra-axial fluid collection, or midline shift. The gray and white compartments appear within normal limits. No acute infarct evident. The bony calvarium appears intact. Mastoid air cells are clear bilaterally. There is debris in each external auditory canal.  IMPRESSION: Areas of atrophy as noted above. No intracranial mass, hemorrhage, or extra-axial fluid collection. No gray-white compartment lesions are identified. No acute infarct evident. Probable cerumen in each external auditory canal.   Electronically Signed   By: Lowella Grip III M.D.   On:  01/03/2015 22:01    Microbiology: No results found for this or any previous visit (from the past 240 hour(s)).   Labs: Basic Metabolic Panel:  Recent Labs Lab 01/03/15 1901 01/04/15 0520  NA 143 143  K 4.3 4.6  CL 106 108  CO2 31 31  GLUCOSE 67 81  BUN 15 13  CREATININE 1.26* 1.02*  CALCIUM 8.4* 8.1*  MG  --  1.8  PHOS  --  3.3   Liver Function Tests:  Recent Labs Lab 01/03/15 1901 01/04/15 0520  AST 37 29  ALT 7* 11*  ALKPHOS 46 45  BILITOT 0.9 0.5  PROT 5.2* 4.7*  ALBUMIN 2.3* 1.9*   No results for input(s): LIPASE, AMYLASE in the last 168 hours. No results for input(s): AMMONIA in the last 168 hours. CBC:  Recent Labs Lab 12/28/14 1435 01/03/15 1901 01/04/15 0520  WBC 4.3 4.4 4.7  NEUTROABS 2.2  --   --   HGB 9.6* 9.9* 9.0*  HCT 30.4* 32.2* 28.7*  MCV 105.0* 106.3* 105.5*  PLT 39* 61* 62*   Cardiac Enzymes: No results for input(s): CKTOTAL, CKMB, CKMBINDEX, TROPONINI in the last 168 hours. BNP: BNP (last 3 results)  Recent Labs  01/03/15 1901  BNP 187.6*    ProBNP (last 3 results) No results for input(s): PROBNP in the last 8760 hours.  CBG:  Recent Labs Lab 01/03/15 2004 01/03/15 2220 01/04/15 0036 01/04/15 0055 01/04/15 0506  GLUCAP 63* 129*  62* 53* 79       Signed:  Debbe Odea, MD [facility] 01/04/2015, 9:43 AM

## 2015-01-04 NOTE — H&P (Addendum)
[facility] History and Physical  Diamond Bowman PXT:[id number] DOB: 11-11-1965 DOA: 01/03/2015  Referring physician: Evelina Bucy, M.D. PCP: Imelda Pillow, NP   Chief Complaint: Weakness and falls.  HPI: Diamond Bowman is a 49 y.o. female with past medical history of schizophrenia, bipolar disorder, seizures, intellectual disability who is brought in to the emergency department by her caregiver due to weakness, somnolence and several falls at home. As per caregiver, this has happened in the last 2 days after the patient had her olanzapine dosage doubled, started on cyproheptadine and gabapentin by her psychiatrist. She denies trauma to the head, constitutional symptoms like fever, chills. There hasn't been significant respiratory, GI or urinary symptoms per her caregiver.  Patient is currently somnolent, but is in no acute distress.  Review of Systems:  Unable to evaluate due to the patient's mental status.  Past Medical History  Diagnosis Date  . Mental retardation   . Schizophrenia   . Fall   . Tardive dyskinesia   . Heart murmur   . Seizures    History reviewed. No pertinent past surgical history. Social History:  reports that she has never smoked. She does not have any smokeless tobacco history on file. She reports that she does not drink alcohol or use illicit drugs.  No Known Allergies  Family History  Problem Relation Age of Onset  . Osteoarthritis Mother   . Diabetes Father     Prior to Admission medications   Medication Sig Start Date End Date Taking? Authorizing Provider  alendronate (FOSAMAX) 70 MG tablet Take 70 mg by mouth every 7 (seven) days. Take with a full glass of water on an empty stomach every Thursday.   Yes Historical Provider, MD  Calcium Carbonate-Vitamin D (OSCAL 500/200 D-3 PO) Take 1 tablet by mouth daily.   Yes Historical Provider, MD  cyproheptadine (PERIACTIN) 4 MG tablet Take 4 mg by mouth at bedtime.   Yes Historical Provider,  MD  divalproex (DEPAKOTE ER) 500 MG 24 hr tablet Take 3 tablets (1,500 mg total) by mouth at bedtime. 05/15/12  Yes Nita Sells, MD  docusate sodium (COLACE) 100 MG capsule Take 100 mg by mouth daily.   Yes Historical Provider, MD  Emollient (EUCERIN) lotion Apply topically 2 (two) times daily.   Yes Historical Provider, MD  Ferrous Sulfate 143 (45 FE) MG TBCR Take 1 tablet by mouth daily.   Yes Historical Provider, MD  gabapentin (NEURONTIN) 300 MG capsule Take 300 mg by mouth 3 (three) times daily.   Yes Historical Provider, MD  hydrOXYzine (ATARAX/VISTARIL) 50 MG tablet Take 50 mg by mouth 2 (two) times daily.   Yes Historical Provider, MD  levothyroxine (SYNTHROID, LEVOTHROID) 50 MCG tablet Take 50 mcg by mouth daily.   Yes Historical Provider, MD  loratadine (LORADAMED) 10 MG tablet Take 10 mg by mouth daily.   Yes Historical Provider, MD  mirtazapine (REMERON) 15 MG tablet Take 15 mg by mouth at bedtime.   Yes Historical Provider, MD  Multiple Vitamins-Minerals (THERATRUM COMPLETE PO) Take 1 tablet by mouth daily.   Yes Historical Provider, MD  olanzapine zydis (ZYPREXA) 15 MG disintegrating tablet Take 30 mg by mouth at bedtime.    Yes Historical Provider, MD  traZODone (DESYREL) 100 MG tablet Take 300 mg by mouth at bedtime.    Yes Historical Provider, MD  HYDROcodone-acetaminophen (NORCO/VICODIN) 5-325 MG per tablet Take 0.5 tablets by mouth every 4 (four) hours as needed for moderate pain. Patient not taking:  Reported on 11/12/2014 06/25/13   Orpah Greek, MD  predniSONE (DELTASONE) 20 MG tablet Take 3 tablets (60 mg total) by mouth daily with breakfast. Patient not taking: Reported on 01/03/2015 11/30/14   Junius Creamer, NP   Physical Exam: Filed Vitals:   01/03/15 2030 01/03/15 2230 01/03/15 2300 01/03/15 2330  BP: 148/69 134/78 114/65 130/69  Pulse: 86 71 70 71  Temp:      TempSrc:      Resp: 15 15 12 12   SpO2: 96% 95% 94% 95%    Wt Readings from Last 3 Encounters:    12/28/14 54.613 kg (120 lb 6.4 oz)  05/14/12 56.79 kg (125 lb 3.2 oz)    General: Somnolent, but arousable. Appears calm. Eyes: PERRL, normal lids, irises & conjunctiva ENT: grossly normal hearing, lips & tongue are dry. Neck: no LAD, masses or thyromegaly Cardiovascular: RRR, no m/r/g. No LE edema. Telemetry: SR, no arrhythmias  Respiratory: CTA bilaterally, no w/r/r. Normal respiratory effort. Abdomen: soft, ntnd Skin: Ecchymosis on left arm area Musculoskeletal: grossly normal tone BUE/BLE Psychiatric: Somnolent. Neurologic: grossly non-focal.          Labs on Admission:  Basic Metabolic Panel:  Recent Labs Lab 01/03/15 1901  NA 143  K 4.3  CL 106  CO2 31  GLUCOSE 67  BUN 15  CREATININE 1.26*  CALCIUM 8.4*   Liver Function Tests:  Recent Labs Lab 01/03/15 1901  AST 37  ALT 7*  ALKPHOS 46  BILITOT 0.9  PROT 5.2*  ALBUMIN 2.3*   No results for input(s): LIPASE, AMYLASE in the last 168 hours. No results for input(s): AMMONIA in the last 168 hours. CBC:  Recent Labs Lab 12/28/14 1435 01/03/15 1901  WBC 4.3 4.4  NEUTROABS 2.2  --   HGB 9.6* 9.9*  HCT 30.4* 32.2*  MCV 105.0* 106.3*  PLT 39* 61*    BNP (last 3 results)  Recent Labs  01/03/15 1901  BNP 187.6*   CBG:  Recent Labs Lab 01/03/15 1826 01/03/15 2004 01/03/15 2220  GLUCAP 78 63* 129*    Radiological Exams on Admission: Dg Chest 2 View  01/03/2015   CLINICAL DATA:  Pt's caregiver states that she has increasing weakness due to changes in medication. Hx of heart murmur  EXAM: CHEST  2 VIEW  COMPARISON:  02/14/2008  FINDINGS: Cardiac silhouette is normal in size and configuration. No mediastinal or hilar masses or evidence of adenopathy.  There is opacity along the oblique fissure, seen on the lateral view, likely atelectasis. There is no convincing pneumonia and no pulmonary edema. No pleural effusion pneumothorax.  Skeletal structures are demineralized but grossly intact.   IMPRESSION: No acute cardiopulmonary disease.   Electronically Signed   By: Lajean Manes M.D.   On: 01/03/2015 19:34   Dg Pelvis 1-2 Views  01/03/2015   CLINICAL DATA:  Pain following fall  EXAM: PELVIS - 1-2 VIEW  COMPARISON:  None.  FINDINGS: There is no evidence of pelvic fracture or dislocation. Joint spaces appear intact. No erosive change.  IMPRESSION: No fracture or dislocation.  No appreciable arthropathy.   Electronically Signed   By: Lowella Grip III M.D.   On: 01/03/2015 21:47   Ct Head Wo Contrast  01/03/2015   CLINICAL DATA:  Pain following fall  EXAM: CT HEAD WITHOUT CONTRAST  TECHNIQUE: Contiguous axial images were obtained from the base of the skull through the vertex without intravenous contrast.  COMPARISON:  November 30, 2014  FINDINGS: The ventricles  are normal in size and configuration. There is temporal and parietal lobe atrophy bilaterally, slightly more on the left than on the right, stable. There is no intracranial mass, hemorrhage, extra-axial fluid collection, or midline shift. The gray and white compartments appear within normal limits. No acute infarct evident. The bony calvarium appears intact. Mastoid air cells are clear bilaterally. There is debris in each external auditory canal.  IMPRESSION: Areas of atrophy as noted above. No intracranial mass, hemorrhage, or extra-axial fluid collection. No gray-white compartment lesions are identified. No acute infarct evident. Probable cerumen in each external auditory canal.   Electronically Signed   By: Lowella Grip III M.D.   On: 01/03/2015 22:01    EKG: Independently reviewed. Vent. rate 87 BPM PR interval 114 ms QRS duration 75 ms QT/QTc 412/496 ms P-R-T axes 59 51 48  Sinus rhythm Borderline short PR interval Abnormal R-wave progression, early transition Consider left ventricular hypertrophy Borderline prolonged QT interval  Assessment/Plan Principal Problem:   Change in mental status (Likely secondary to  medication interactions)  Active Problems:   Bipolar 2 disorder   Fall   Hypothyroid   Schizophrenia  Admit to monitor mental status. Neuro checks every 4 hours. Hold medications that cause somnolence. Call the patient's psychiatrist in the morning during business hours for medication adjustment or consider inpatient psych consult. Continue level thyroxine.   Code Status: Full code. DVT Prophylaxis: SCDs. Family Communication: Fayne Norrie [phone]  [phone]  Patient primary caregiver. Disposition Plan: Admit to follow up mental status. Discharge home with medications adjustment or to psych facility if deemed appropriate by psychiatry.  Time spent: Over 60 minutes.  Reubin Milan [facility] Pager [phone].

## 2015-01-04 NOTE — Telephone Encounter (Signed)
Incoming text that pt is admitted to [facility] hospital room 1516. FYI.

## 2015-01-09 ENCOUNTER — Other Ambulatory Visit: Payer: Self-pay | Admitting: Radiology

## 2015-01-10 ENCOUNTER — Other Ambulatory Visit: Payer: Self-pay | Admitting: Radiology

## 2015-01-11 ENCOUNTER — Ambulatory Visit (HOSPITAL_COMMUNITY)
Admission: RE | Admit: 2015-01-11 | Discharge: 2015-01-11 | Disposition: A | Discharge status: Home / Self Care | Payer: Medicaid Other | Source: Ambulatory Visit | Attending: Oncology | Admitting: Oncology

## 2015-01-11 ENCOUNTER — Encounter (HOSPITAL_COMMUNITY): Payer: Self-pay

## 2015-01-11 DIAGNOSIS — D696 Thrombocytopenia, unspecified: Secondary | ICD-10-CM | POA: Diagnosis not present

## 2015-01-11 DIAGNOSIS — Z79899 Other long term (current) drug therapy: Secondary | ICD-10-CM | POA: Insufficient documentation

## 2015-01-11 DIAGNOSIS — D539 Nutritional anemia, unspecified: Secondary | ICD-10-CM | POA: Insufficient documentation

## 2015-01-11 DIAGNOSIS — R569 Unspecified convulsions: Secondary | ICD-10-CM | POA: Insufficient documentation

## 2015-01-11 DIAGNOSIS — F209 Schizophrenia, unspecified: Secondary | ICD-10-CM

## 2015-01-11 DIAGNOSIS — R011 Cardiac murmur, unspecified: Secondary | ICD-10-CM | POA: Diagnosis not present

## 2015-01-11 DIAGNOSIS — F79 Unspecified intellectual disabilities: Secondary | ICD-10-CM | POA: Diagnosis not present

## 2015-01-11 DIAGNOSIS — D7589 Other specified diseases of blood and blood-forming organs: Secondary | ICD-10-CM | POA: Insufficient documentation

## 2015-01-11 LAB — CBC
HCT: 29.3 % — ABNORMAL LOW (ref 36.0–46.0)
Hemoglobin: 9 g/dL — ABNORMAL LOW (ref 12.0–15.0)
MCH: 32.8 pg (ref 26.0–34.0)
MCHC: 30.7 g/dL (ref 30.0–36.0)
MCV: 106.9 fL — AB (ref 78.0–100.0)
PLATELETS: 79 10*3/uL — AB (ref 150–400)
RBC: 2.74 MIL/uL — ABNORMAL LOW (ref 3.87–5.11)
RDW: 15.4 % (ref 11.5–15.5)
WBC: 9.4 10*3/uL (ref 4.0–10.5)

## 2015-01-11 LAB — PROTIME-INR
INR: 1.07 (ref 0.00–1.49)
Prothrombin Time: 14.1 seconds (ref 11.6–15.2)

## 2015-01-11 LAB — BONE MARROW EXAM

## 2015-01-11 LAB — APTT: aPTT: 33 seconds (ref 24–37)

## 2015-01-11 MED ORDER — FENTANYL CITRATE (PF) 100 MCG/2ML IJ SOLN
INTRAMUSCULAR | Status: AC | PRN
  Administered 2015-01-11 (×3): 25 ug via INTRAVENOUS

## 2015-01-11 MED ORDER — MIDAZOLAM HCL 2 MG/2ML IJ SOLN
INTRAMUSCULAR | Status: AC | PRN
  Administered 2015-01-11: 1 mg via INTRAVENOUS
  Administered 2015-01-11 (×2): 0.5 mg via INTRAVENOUS

## 2015-01-11 MED ORDER — FENTANYL CITRATE (PF) 100 MCG/2ML IJ SOLN
INTRAMUSCULAR | Status: AC
  Filled 2015-01-11: qty 2

## 2015-01-11 MED ORDER — SODIUM CHLORIDE 0.9 % IV SOLN
Freq: Once | INTRAVENOUS | Status: AC
  Administered 2015-01-11: 08:00:00 via INTRAVENOUS

## 2015-01-11 MED ORDER — MIDAZOLAM HCL 2 MG/2ML IJ SOLN
INTRAMUSCULAR | Status: AC
  Filled 2015-01-11: qty 2

## 2015-01-11 NOTE — Procedures (Signed)
R iliac BM aspirate and core. No comp/EBL 

## 2015-01-11 NOTE — Discharge Instructions (Signed)
Conscious  Hold medications morning of procedure 01/11/2015. Will resume medication after procedure 01/11/2015  Call [facility] at [phone] for any problems or questions. Tylenol if needed for post biopsy pain if no relief call Radiology   Sedation is the use of medicines to promote relaxation and relieve discomfort and anxiety. Conscious sedation is a type of sedation. Under conscious sedation you are less alert than normal but are still able to respond to instructions or stimulation. Conscious sedation is used during short medical and dental procedures. It is milder than deep sedation or general anesthesia and allows you to return to your regular activities sooner.  LET [facility] CARE PROVIDER KNOW ABOUT:   Any allergies you have.  All medicines you are taking, including vitamins, herbs, eye drops, creams, and over-the-counter medicines.  Use of steroids (by mouth or creams).  Previous problems you or members of your family have had with the use of anesthetics.  Any blood disorders you have.  Previous surgeries you have had.  Medical conditions you have.  Possibility of pregnancy, if this applies.  Use of cigarettes, alcohol, or illegal drugs. RISKS AND COMPLICATIONS Generally, this is a safe procedure. However, as with any procedure, problems can occur. Possible problems include:  Oversedation.  Trouble breathing on your own. You may need to have a breathing tube until you are awake and breathing on your own.  Allergic reaction to any of the medicines used for the procedure. BEFORE THE PROCEDURE  You may have blood tests done. These tests can help show how well your kidneys and liver are working. They can also show how well your blood clots.  A physical exam will be done.  Only take medicines as directed by your health care provider. You may need to stop taking medicines (such as blood thinners, aspirin, or nonsteroidal anti-inflammatory drugs) before the  procedure.   Do not eat or drink at least 6 hours before the procedure or as directed by your health care provider.  Arrange for a responsible adult, family member, or friend to take you home after the procedure. He or she should stay with you for at least 24 hours after the procedure, until the medicine has worn off. PROCEDURE   An intravenous (IV) catheter will be inserted into one of your veins. Medicine will be able to flow directly into your body through this catheter. You may be given medicine through this tube to help prevent pain and help you relax.  The medical or dental procedure will be done. AFTER THE PROCEDURE  You will stay in a recovery area until the medicine has worn off. Your blood pressure and pulse will be checked.   Depending on the procedure you had, you may be allowed to go home when you can tolerate liquids and your pain is under control. Document Released: 02/03/2001 Document Revised: 05/16/2013 Document Reviewed: 01/16/2013 [facility] Patient Information 2015 [place], [place]. This information is not intended to replace advice given to you by your health care provider. Make sure you discuss any questions you have with your health care provider. Bone Marrow Aspiration, Bone Marrow Biopsy Care After Read the instructions outlined below and refer to this sheet in the next few weeks. These discharge instructions provide you with general information on caring for yourself after you leave the hospital. Your caregiver may also give you specific instructions. While your treatment has been planned according to the most current medical practices available, unavoidable complications occasionally occur. If you have any problems  or questions after discharge, call your caregiver. FINDING OUT THE RESULTS OF YOUR TEST Not all test results are available during your visit. If your test results are not back during the visit, make an appointment with your caregiver to find out the results.  Do not assume everything is normal if you have not heard from your caregiver or the medical facility. It is important for you to follow up on all of your test results.  HOME CARE INSTRUCTIONS  You have had sedation and may be sleepy or dizzy. Your thinking may not be as clear as usual. For the next 24 hours:  Only take over-the-counter or prescription medicines for pain, discomfort, and or fever as directed by your caregiver.  Do not drink alcohol.  Do not smoke.  Do not drive.  Do not make important legal decisions.  Do not operate heavy machinery.  Do not care for small children by yourself.  Keep your dressing clean and dry. You may replace dressing with a bandage after 24 hours.  You may take a bath or shower after 24 hours.  Use an ice pack for 20 minutes every 2 hours while awake for pain as needed. SEEK MEDICAL CARE IF:   There is redness, swelling, or increasing pain at the biopsy site.  There is pus coming from the biopsy site.  There is drainage from a biopsy site lasting longer than one day.  An unexplained oral temperature above 102 F (38.9 C) develops. SEEK IMMEDIATE MEDICAL CARE IF:   You develop a rash.  You have difficulty breathing.  You develop any reaction or side effects to medications given. Document Released: 11/28/2004 Document Revised: 08/03/2011 Document Reviewed: 05/08/2008 [facility] Patient Information 2015 [place], [place]. This information is not intended to replace advice given to you by your health care provider. Make sure you discuss any questions you have with your health care provider. Bone Marrow Aspiration and Bone Biopsy Examination of the bone marrow is a valuable test to diagnose blood disorders. A bone marrow biopsy takes a sample of bone and a small amount of fluid and cells from inside the bone. A bone marrow aspiration removes only the marrow. Bone marrow aspiration and bone biopsies are used to stage different disorders of the  blood, such as leukemia. Staging will help your caregiver understand how far the disease has progressed.  The tests are also useful in diagnosing:  Fever of unknown origin (FUO).  Bacterial infections and other widespread fungal infections.  Cancers that have spread (metastasized) to the bone marrow.  Diseases that are characterized by a deficiency of an enzyme (storage diseases). This includes:  Niemann-Pick disease.  Gaucher disease. PROCEDURE  Sites used to get samples include:   Back of your hip bone (posterior iliac crest).  Both aspiration and biopsy.  Front of your hip bone (anterior iliac crest).  Both aspiration and biopsy.  Breastbone (sternum).  Aspiration from your breastbone (done only in adults). This method is rarely used. When you get a hip bone aspiration:  You are placed lying on your side with the upper knee brought up and flexed with the lower leg straight.  The site is prepared, cleaned with an antiseptic scrub, and draped. This keeps the biopsy area clean.  The skin and the area down to the lining of the bone (periosteum) are made numb with a local anesthetic.  The bone marrow aspiration needle is inserted. You will feel pressure on your bone.  Once inside the marrow cavity, a  sample of bone marrow is sucked out (aspirated) for pathology slides.  The material collected for bone marrow slides is processed immediately by a technologist.  The technician selects the marrow particles to make the slides for pathology.  The marrow aspiration needle is removed. Then pressure is applied to the site with gauze until bleeding has stopped. Following an aspiration, a bone marrow biopsy may be performed as well. The technique for this is very similar. A dressing is then applied.  RISKS AND COMPLICATIONS  The main complications of a bone marrow aspiration and biopsy include infection and bleeding.  Complications are uncommon. The procedure may not be performed  in patients with bleeding tendencies.  A very rare complication from the procedure is injury to the heart during a breastbone (sternal) marrow aspiration. Only bone marrow aspirations are performed in this area.  Long-lasting pain at the site of the bone marrow aspiration and biopsy is uncommon. Your caregiver will let you know when you are to get your results and will discuss them with you. You may make an appointment with your caregiver to find out the results. Do not assume everything is normal if you have not heard from your caregiver or the medical facility. It is important for you to follow up on all of your test results. Document Released: 05/14/2004 Document Revised: 08/03/2011 Document Reviewed: 05/08/2008 [facility] Patient Information 2015 [place], [place]. This information is not intended to replace advice given to you by your health care provider. Make sure you discuss any questions you have with your health care provider.

## 2015-01-11 NOTE — H&P (Signed)
Chief Complaint: Patient was seen in consultation today for macrocytosis; thrombocytopenia at the request of Shadad,Firas N  Referring Physician(s): Shadad,Firas N  History of Present Illness: Diamond Bowman is a 49 y.o. female   Pt diagnosed with macrocytosis and thrombocytopenia Dr Alen Blew has treated with steroid medication No response Requesting Bone marrow biopsy to evaluate possible myelodysplasia disorder  Past Medical History  Diagnosis Date  . Mental retardation   . Schizophrenia   . Fall   . Tardive dyskinesia   . Heart murmur   . Seizures     History reviewed. No pertinent past surgical history.  Allergies: Review of patient's allergies indicates no known allergies.  Medications: Prior to Admission medications   Medication Sig Start Date End Date Taking? Authorizing Provider  alendronate (FOSAMAX) 70 MG tablet Take 70 mg by mouth every 7 (seven) days. Take with a full glass of water on an empty stomach every Thursday.   Yes Historical Provider, MD  Calcium Carbonate-Vitamin D (OSCAL 500/200 D-3 PO) Take 1 tablet by mouth daily.   Yes Historical Provider, MD  cyproheptadine (PERIACTIN) 4 MG tablet Take 4 mg by mouth at bedtime.   Yes Historical Provider, MD  divalproex (DEPAKOTE ER) 500 MG 24 hr tablet Take 3 tablets (1,500 mg total) by mouth at bedtime. 05/15/12  Yes Nita Sells, MD  docusate sodium (COLACE) 100 MG capsule Take 100 mg by mouth daily.   Yes Historical Provider, MD  Emollient (EUCERIN) lotion Apply topically 2 (two) times daily.   Yes Historical Provider, MD  Ferrous Sulfate 143 (45 FE) MG TBCR Take 1 tablet by mouth daily.   Yes Historical Provider, MD  hydrOXYzine (ATARAX/VISTARIL) 50 MG tablet Take 50 mg by mouth 2 (two) times daily.   Yes Historical Provider, MD  levothyroxine (SYNTHROID, LEVOTHROID) 50 MCG tablet Take 50 mcg by mouth daily.   Yes Historical Provider, MD  loratadine (LORADAMED) 10 MG tablet Take 10 mg by mouth  daily.   Yes Historical Provider, MD  mirtazapine (REMERON) 15 MG tablet Take 15 mg by mouth at bedtime.   Yes Historical Provider, MD  Multiple Vitamins-Minerals (THERATRUM COMPLETE PO) Take 1 tablet by mouth daily.   Yes Historical Provider, MD  olanzapine zydis (ZYPREXA) 15 MG disintegrating tablet Take 1 tablet (15 mg total) by mouth at bedtime. 01/04/15  Yes Debbe Odea, MD  traZODone (DESYREL) 100 MG tablet Take 300 mg by mouth at bedtime.    Yes Historical Provider, MD     Family History  Problem Relation Age of Onset  . Osteoarthritis Mother   . Diabetes Father     Social History   Social History  . Marital Status: Single    Spouse Name: N/A  . Number of Children: N/A  . Years of Education: N/A   Social History Main Topics  . Smoking status: Never Smoker   . Smokeless tobacco: None  . Alcohol Use: No  . Drug Use: No  . Sexual Activity: Not Asked   Other Topics Concern  . None   Social History Narrative   Patient has been living at her current arrangements with Mrs. Docia Barrier at telephone number [phone]   Mother can be reached at [phone]   Patient is pretty much total care-she is incontinent of urine and stool.     Review of Systems: A 12 point ROS discussed and pertinent positives are indicated in the HPI above.  All other systems are negative.  Review of Systems  Constitutional:  Positive for fatigue and unexpected weight change. Negative for activity change.  Respiratory: Negative for choking and shortness of breath.   Cardiovascular: Negative for chest pain.  Gastrointestinal: Negative for abdominal pain.  Psychiatric/Behavioral: Positive for behavioral problems.       Bipolar Mental reterdation     Vital Signs: BP 100/65 mmHg  Pulse 98  Temp(Src) 98.4 F (36.9 C) (Oral)  Resp 18  SpO2 96%  Physical Exam  Cardiovascular: Normal rate, regular rhythm and normal heart sounds.   Pulmonary/Chest: Effort normal and breath sounds normal.  Abdominal: Soft.  Bowel sounds are normal. There is no tenderness.  Musculoskeletal: Normal range of motion.  Neurological: She is alert.  Mentally retarded Follows commands  Skin: Skin is warm and dry.  Psychiatric:  Consent signed by mother at bedside  Nursing note and vitals reviewed.   Mallampati Score:  MD Evaluation Airway: WNL Heart: WNL Abdomen: WNL Chest/ Lungs: WNL ASA  Classification: 3 Mallampati/Airway Score: Two  Imaging: Dg Chest 2 View  01/03/2015   CLINICAL DATA:  Pt's caregiver states that she has increasing weakness due to changes in medication. Hx of heart murmur  EXAM: CHEST  2 VIEW  COMPARISON:  02/14/2008  FINDINGS: Cardiac silhouette is normal in size and configuration. No mediastinal or hilar masses or evidence of adenopathy.  There is opacity along the oblique fissure, seen on the lateral view, likely atelectasis. There is no convincing pneumonia and no pulmonary edema. No pleural effusion pneumothorax.  Skeletal structures are demineralized but grossly intact.  IMPRESSION: No acute cardiopulmonary disease.   Electronically Signed   By: Lajean Manes M.D.   On: 01/03/2015 19:34   Dg Pelvis 1-2 Views  01/03/2015   CLINICAL DATA:  Pain following fall  EXAM: PELVIS - 1-2 VIEW  COMPARISON:  None.  FINDINGS: There is no evidence of pelvic fracture or dislocation. Joint spaces appear intact. No erosive change.  IMPRESSION: No fracture or dislocation.  No appreciable arthropathy.   Electronically Signed   By: Lowella Grip III M.D.   On: 01/03/2015 21:47   Ct Head Wo Contrast  01/03/2015   CLINICAL DATA:  Pain following fall  EXAM: CT HEAD WITHOUT CONTRAST  TECHNIQUE: Contiguous axial images were obtained from the base of the skull through the vertex without intravenous contrast.  COMPARISON:  November 30, 2014  FINDINGS: The ventricles are normal in size and configuration. There is temporal and parietal lobe atrophy bilaterally, slightly more on the left than on the right, stable. There  is no intracranial mass, hemorrhage, extra-axial fluid collection, or midline shift. The gray and white compartments appear within normal limits. No acute infarct evident. The bony calvarium appears intact. Mastoid air cells are clear bilaterally. There is debris in each external auditory canal.  IMPRESSION: Areas of atrophy as noted above. No intracranial mass, hemorrhage, or extra-axial fluid collection. No gray-white compartment lesions are identified. No acute infarct evident. Probable cerumen in each external auditory canal.   Electronically Signed   By: Lowella Grip III M.D.   On: 01/03/2015 22:01    Labs:  CBC:  Recent Labs  12/28/14 1435 01/03/15 1901 01/04/15 0520 01/11/15 0725  WBC 4.3 4.4 4.7 9.4  HGB 9.6* 9.9* 9.0* 9.0*  HCT 30.4* 32.2* 28.7* 29.3*  PLT 39* 61* 62* 79*    COAGS:  Recent Labs  01/11/15 0725  INR 1.07  APTT 33    BMP:  Recent Labs  11/11/14 2313 11/30/14 1812 01/03/15 1901 01/04/15 0520  NA 135 136 143 143  K 3.6 3.7 4.3 4.6  CL 101 105 106 108  CO2 25 22 31 31   GLUCOSE 91 100* 67 81  BUN 20 16 15 13   CALCIUM 8.1* 8.0* 8.4* 8.1*  CREATININE 1.40* 1.39* 1.26* 1.02*  GFRNONAA 43* 43* 48* >60  GFRAA 49* 50* 56* >60    LIVER FUNCTION TESTS:  Recent Labs  11/30/14 1812 01/03/15 1901 01/04/15 0520  BILITOT 0.5 0.9 0.5  AST 25 37 29  ALT 13* 7* 11*  ALKPHOS 54 46 45  PROT 5.4* 5.2* 4.7*  ALBUMIN 2.4* 2.3* 1.9*    TUMOR MARKERS: No results for input(s): AFPTM, CEA, CA199, CHROMGRNA in the last 8760 hours.  Assessment and Plan:  Mental retardation Bipolar/schizophrenia Macrocytosis and thrombocytopenia not responsive to steroid medication Scheduled now for bone marrow bx---possible myelodysplasia disorder Risks and Benefits discussed with the patient's mother including, but not limited to bleeding, infection, damage to adjacent structures or low yield requiring additional tests. All of her questions were answered, she is  agreeable to proceed. Consent signed and in chart.   Thank you for this interesting consult.  I greatly enjoyed meeting Diamond Bowman and look forward to participating in their care.  A copy of this report was sent to the requesting provider on this date.  Signed: Ridley Dileo A 01/11/2015, 8:17 AM   I spent a total of  30 Minutes   in face to face in clinical consultation, greater than 50% of which was counseling/coordinating care for bm bx

## 2015-01-17 ENCOUNTER — Other Ambulatory Visit: Payer: Medicaid Other

## 2015-01-17 ENCOUNTER — Ambulatory Visit (HOSPITAL_BASED_OUTPATIENT_CLINIC_OR_DEPARTMENT_OTHER): Payer: Medicaid Other | Admitting: Oncology

## 2015-01-17 VITALS — BP 135/77 | HR 98 | Temp 98.9°F | Resp 18 | Ht <= 58 in | Wt 116.7 lb

## 2015-01-17 DIAGNOSIS — D649 Anemia, unspecified: Secondary | ICD-10-CM | POA: Diagnosis not present

## 2015-01-17 DIAGNOSIS — D696 Thrombocytopenia, unspecified: Secondary | ICD-10-CM | POA: Diagnosis present

## 2015-01-17 NOTE — Progress Notes (Signed)
Hematology and Oncology Follow Up Visit  DAFFNEY GREENLY [id number] 1965/11/02 49 y.o. 01/17/2015 3:27 PM Millsaps, Otilio Saber, NP   Principle Diagnosis: 49 year old woman with microcytic anemia and mild thrombocytopenia diagnosed in August 2016. Her workup has been unrevealing for a plasma cell disorder or myelodysplasia.   Prior Therapy: She is status post a bone marrow biopsy done on 01/11/2015 which showed no evidence of leukemia or clear-cut dysplasia.  Current therapy: Observation and surveillance.  Interim History:  Ms. Calix presents today for a follow-up visit accompanied by her caregiver. Since her last visit, she was hospitalized to briefly between August 11 and August 12 of 2016 for somnolence and weakness. She recovered rather quickly and felt that olanzapine was responsible for her altered mental status. She recovered well and underwent a bone marrow biopsy on 01/04/2015 without any complications. According to the caregiver that accompanies her today have not had any other complaints. She denied any headaches or blurry vision or seizures. She has not had any behavioral issues. He does not report any bleeding complications. The remaining review of system was difficult to obtain.  Medications: I have reviewed the patient's current medications.  Current Outpatient Prescriptions  Medication Sig Dispense Refill  . alendronate (FOSAMAX) 70 MG tablet Take 70 mg by mouth every 7 (seven) days. Take with a full glass of water on an empty stomach every Thursday.    . Calcium Carbonate-Vitamin D (OSCAL 500/200 D-3 PO) Take 1 tablet by mouth daily.    . cyproheptadine (PERIACTIN) 4 MG tablet Take 4 mg by mouth at bedtime.    . divalproex (DEPAKOTE ER) 500 MG 24 hr tablet Take 3 tablets (1,500 mg total) by mouth at bedtime.    . docusate sodium (COLACE) 100 MG capsule Take 100 mg by mouth daily.    . Emollient (EUCERIN) lotion Apply topically 2 (two) times daily.    .  Ferrous Sulfate 143 (45 FE) MG TBCR Take 1 tablet by mouth daily.    . hydrOXYzine (ATARAX/VISTARIL) 50 MG tablet Take 50 mg by mouth 2 (two) times daily.    Marland Kitchen levothyroxine (SYNTHROID, LEVOTHROID) 50 MCG tablet Take 50 mcg by mouth daily.    Marland Kitchen loratadine (LORADAMED) 10 MG tablet Take 10 mg by mouth daily.    . mirtazapine (REMERON) 15 MG tablet Take 15 mg by mouth at bedtime.    . Multiple Vitamins-Minerals (THERATRUM COMPLETE PO) Take 1 tablet by mouth daily.    Marland Kitchen olanzapine zydis (ZYPREXA) 15 MG disintegrating tablet Take 1 tablet (15 mg total) by mouth at bedtime.    . traZODone (DESYREL) 100 MG tablet Take 300 mg by mouth at bedtime.      No current facility-administered medications for this visit.     Allergies: No Known Allergies  Past Medical History, Surgical history, Social history, and Family History were reviewed and updated.   Physical Exam: Blood pressure 135/77, pulse 98, temperature 98.9 F (37.2 C), temperature source Oral, resp. rate 18, height 4' 9" (1.448 m), weight 116 lb 11.2 oz (52.935 kg), SpO2 98 %. ECOG: 3 General appearance: alert and cooperative Head: Normocephalic, without obvious abnormality Neck: no adenopathy Lymph nodes: Cervical, supraclavicular, and axillary nodes normal. Heart:regular rate and rhythm, S1, S2 normal, no murmur, click, rub or gallop Lung:chest clear, no wheezing, rales, normal symmetric air entry Abdomin: soft, non-tender, without masses or organomegaly EXT:no erythema, induration, or nodules   Lab Results: Lab Results  Component Value Date   WBC 9.4  01/11/2015   HGB 9.0* 01/11/2015   HCT 29.3* 01/11/2015   MCV 106.9* 01/11/2015   PLT 79* 01/11/2015     Chemistry      Component Value Date/Time   NA 143 01/04/2015 0520   K 4.6 01/04/2015 0520   CL 108 01/04/2015 0520   CO2 31 01/04/2015 0520   BUN 13 01/04/2015 0520   CREATININE 1.02* 01/04/2015 0520      Component Value Date/Time   CALCIUM 8.1* 01/04/2015 0520    ALKPHOS 45 01/04/2015 0520   AST 29 01/04/2015 0520   ALT 11* 01/04/2015 0520   BILITOT 0.5 01/04/2015 0520        Impression and Plan:  49-year-old woman with the following issues:  1. Macrocytic anemia that dates back to 2010 with a hemoglobin arranged between 9-10 and MCV is high as 106. Her bone marrow biopsy obtained on 01/11/2015 did not show any clear-cut plasma cell disorder or MDS. Her cytogenetics did not reveal any abnormalities to suggest myelodysplasia. The etiology of her anemia could be related to medication, vitamin deficiency or early myelodysplasia. There is no evidence of leukemia at this time.  At this time, she is asymptomatic and does not require any intervention. I have recommended continued observation and surveillance and consider growth factor support with Aranesp if her hemoglobin drifts down further or she becomes symptomatic.  2. Thrombocytopenia: No active bleeding is noted. She did not respond to sterilize to suggest ITP. Her bone marrow biopsy did suggest possible dyspoiesis with her megakaryocytes indicating possible early dysplasia. Her platelet count appear adequate at this time without any intervention. We will continue to monitor her counts moving forward.    ,, MD 8/25/20163:27 PM 

## 2015-01-18 LAB — TISSUE HYBRIDIZATION (BONE MARROW)-NCBH

## 2015-01-18 LAB — CHROMOSOME ANALYSIS, BONE MARROW

## 2015-01-23 ENCOUNTER — Encounter: Payer: Self-pay | Admitting: Skilled Nursing Facility1

## 2015-01-23 NOTE — Progress Notes (Signed)
Subjective:     Patient ID: Diamond Bowman, female   DOB: [birth year], 49 y.o.   MRN: [id number]  HPI   Review of Systems     Objective:   Physical Exam To assist the pt in identifying some dietary strategies to gain some lost wt back.    Assessment:     Pt identified as being malnourished due to losing some wt. Pts caregiver was contacted via the telephone at [phone]. Pts caregiver states the pt has Lost about 20 pounds but Appetite has picked back up. The pt is eating three meals a day and at least 2 snacks.     Plan:     Dietitian gave the pts caregiver Ernestene Kiel [facility] number in case she has any questions. [phone]

## 2015-01-25 ENCOUNTER — Encounter (HOSPITAL_COMMUNITY): Payer: Self-pay

## 2015-04-12 ENCOUNTER — Other Ambulatory Visit: Payer: Medicaid Other

## 2015-04-12 ENCOUNTER — Ambulatory Visit: Payer: Medicaid Other | Admitting: Oncology

## 2016-10-11 IMAGING — CR DG PELVIS 1-2V
1 series · 1 of 1 positions shown · non-contrast
Comparison: None.

CLINICAL DATA: Pain following fall

EXAM:
PELVIS - 1-2 VIEW

[x pelvis]
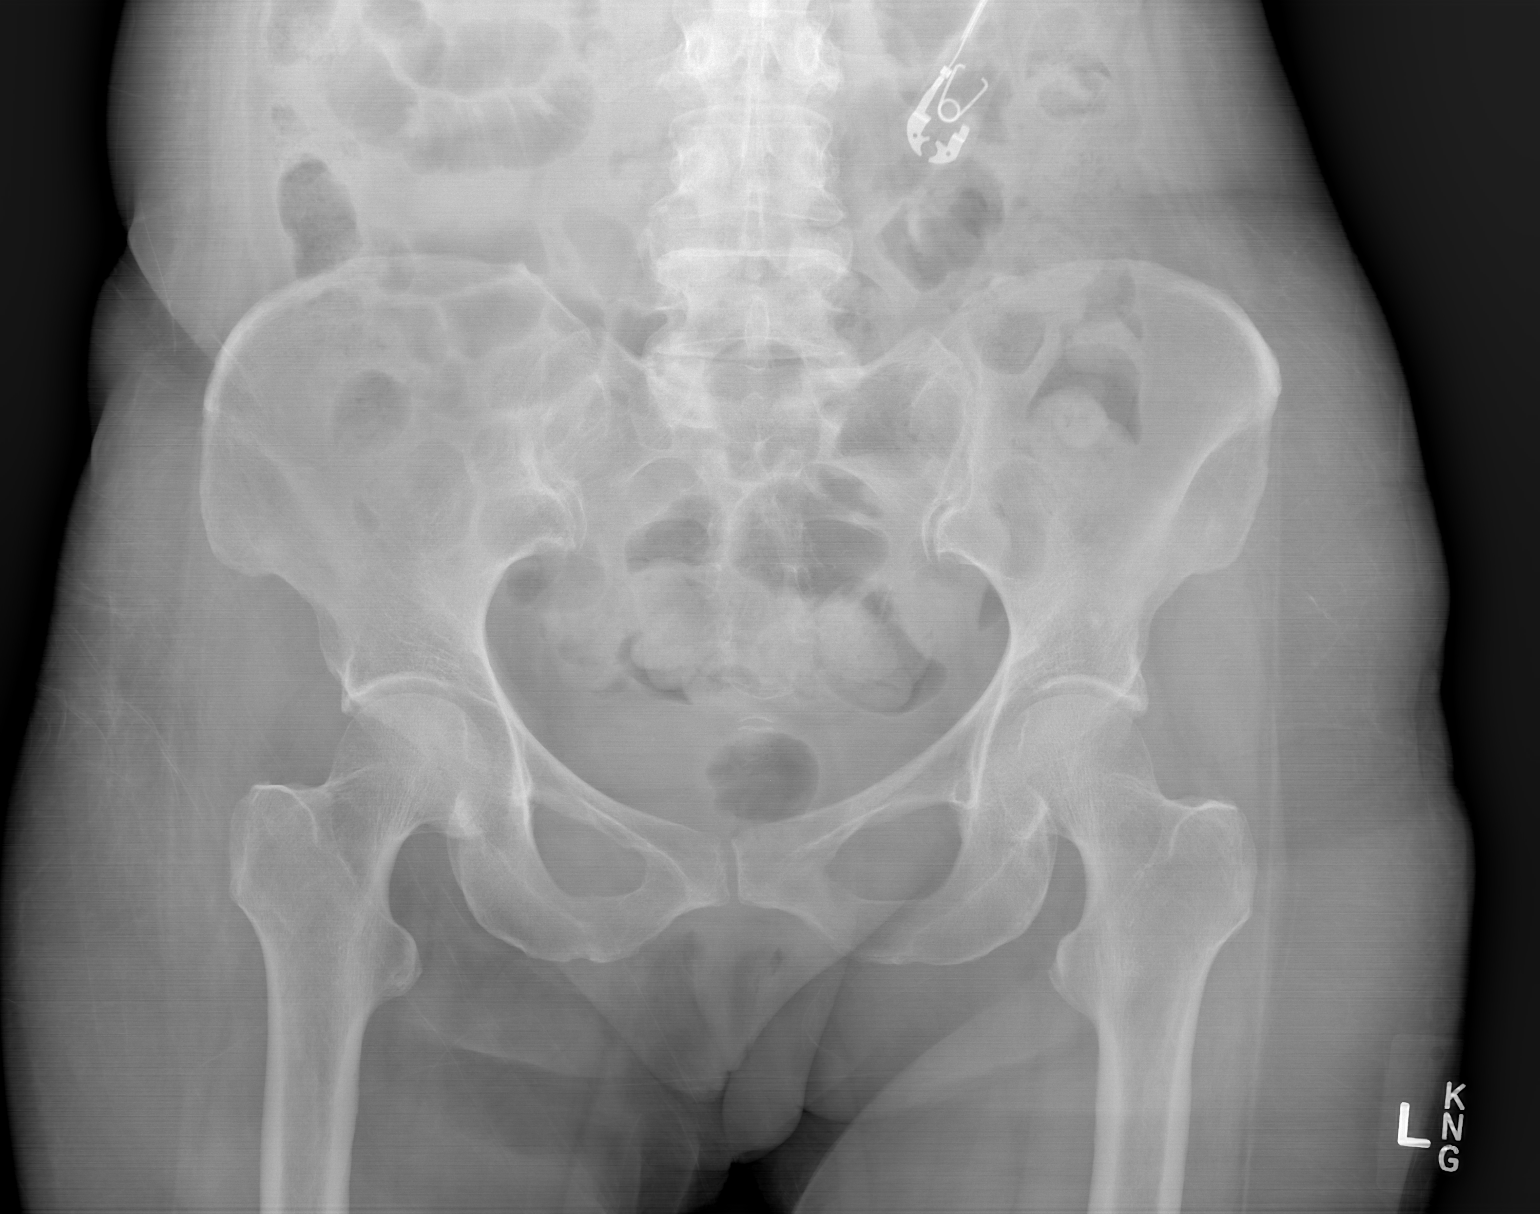

[1 of 1 positions shown; findings below may reference images not displayed]

FINDINGS: There is no evidence of pelvic fracture or dislocation. Joint spaces
appear intact. No erosive change.
IMPRESSION: No fracture or dislocation.  No appreciable arthropathy.

## 2016-10-11 IMAGING — CR DG CHEST 2V
2 series · 2 of 2 positions shown · non-contrast
Comparison: 02/14/2008

CLINICAL DATA: Pt's caregiver states that she has increasing
weakness due to changes in medication. Hx of heart murmur

EXAM:
CHEST  2 VIEW

[w chest lat]
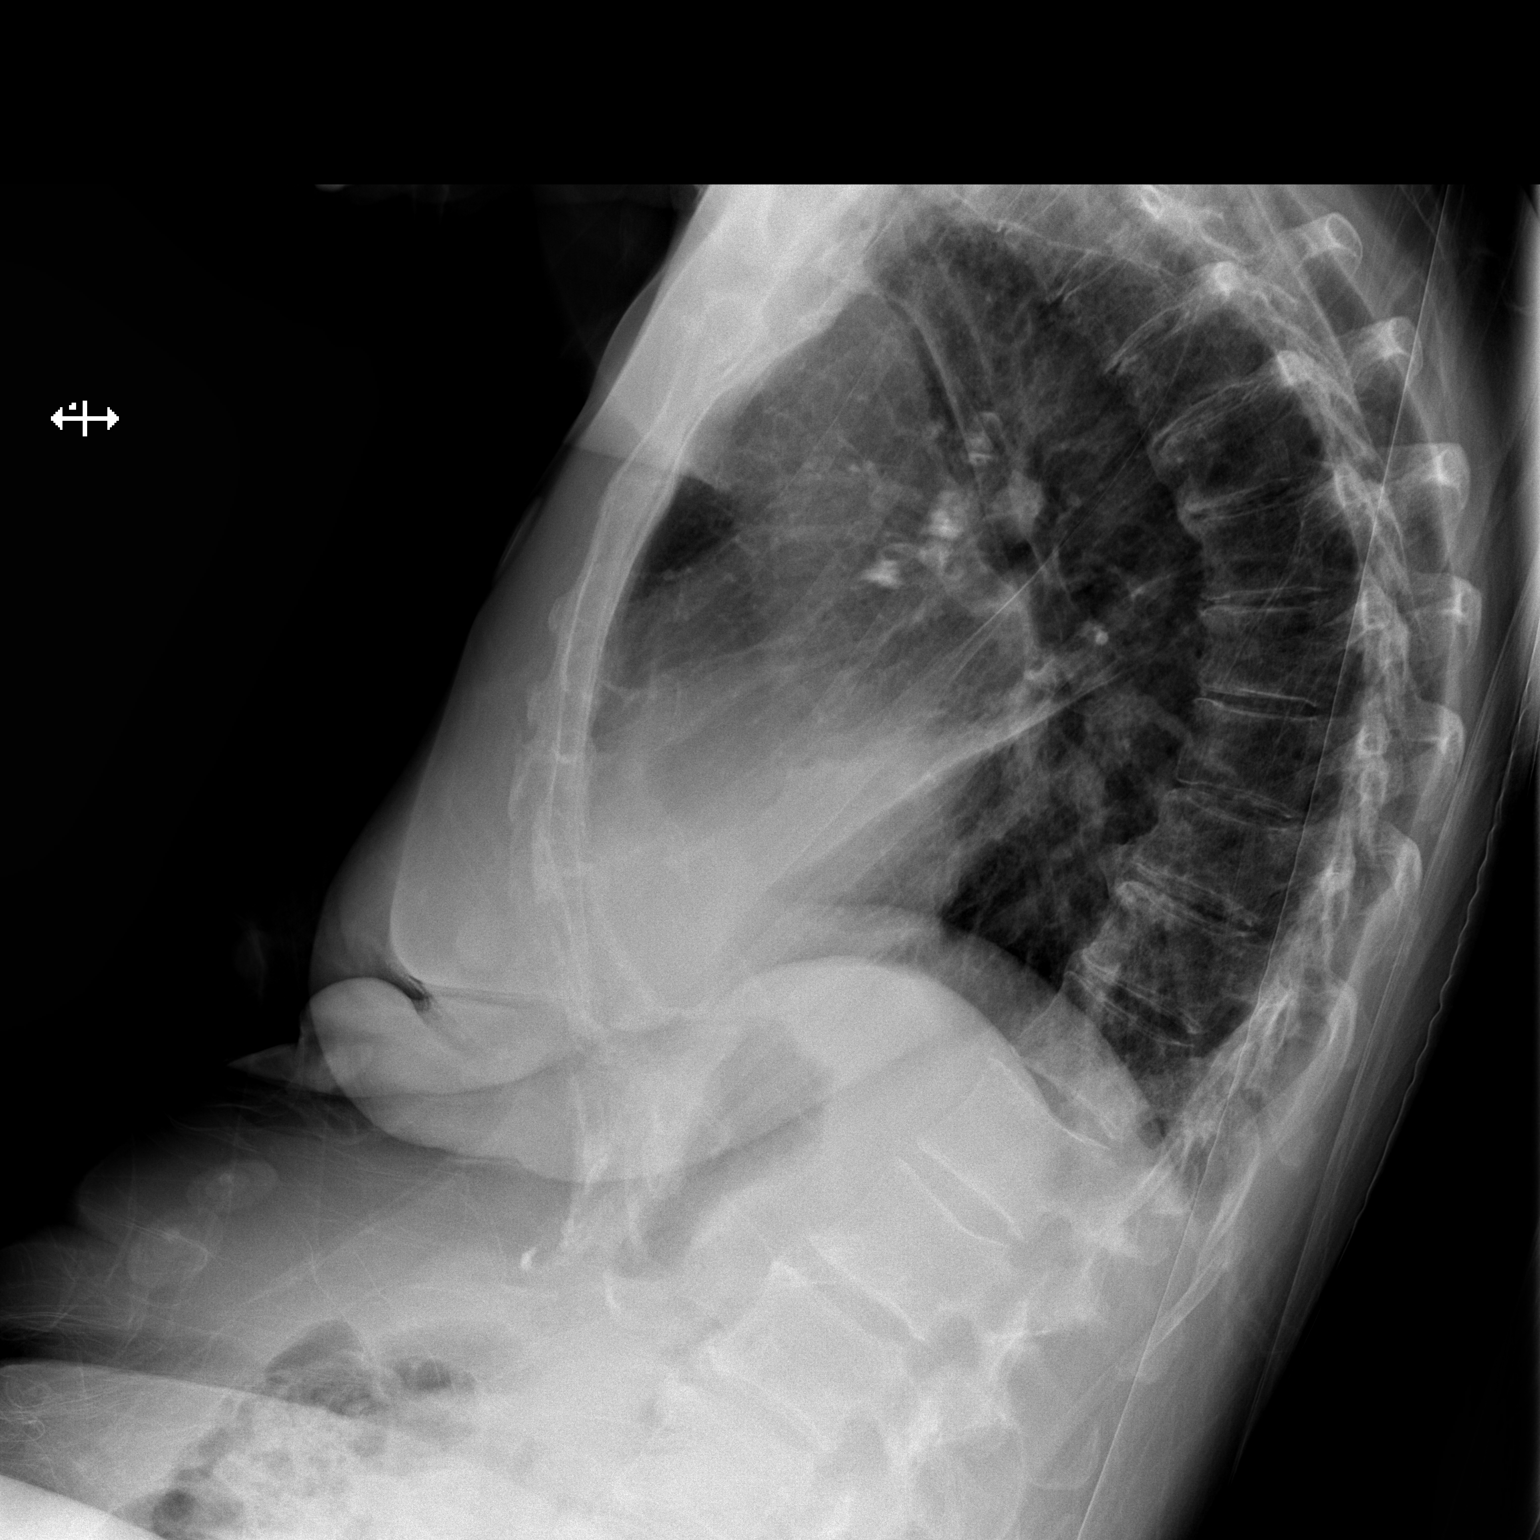

[x chest ap]
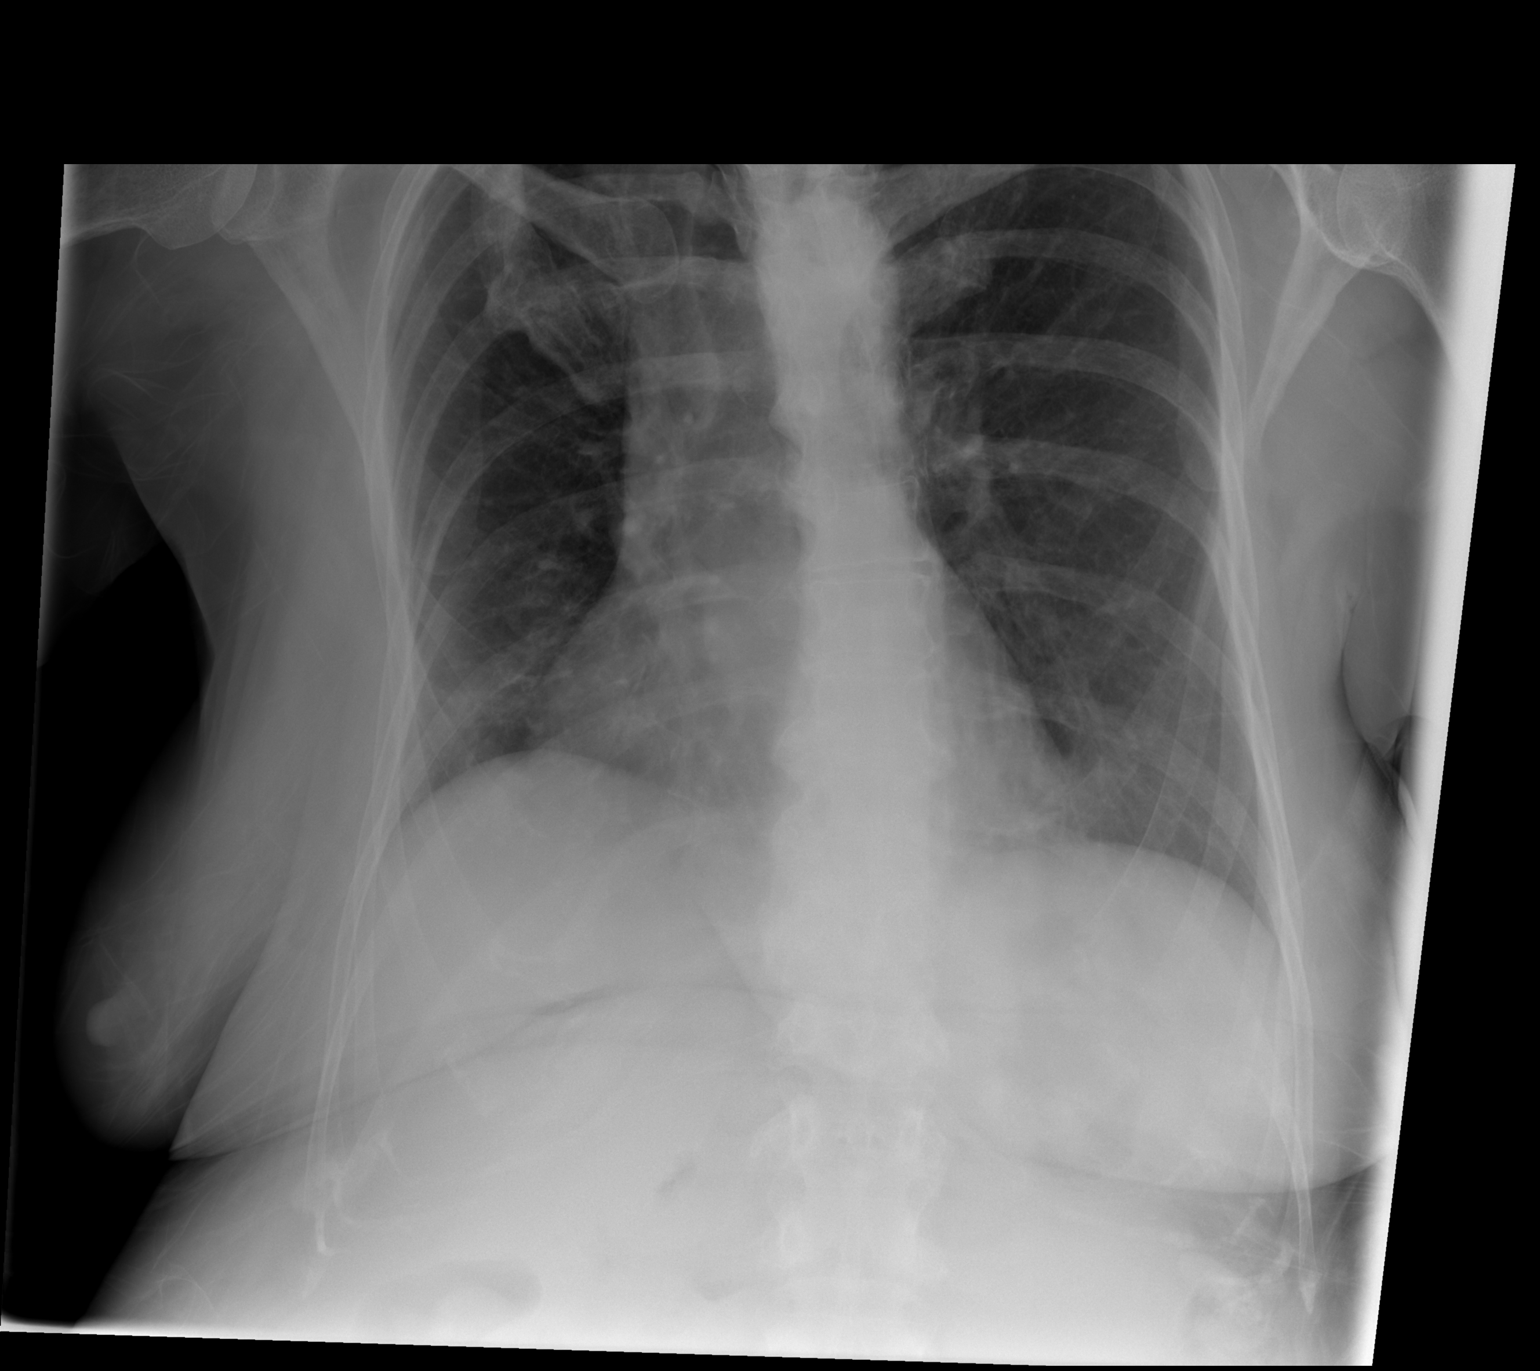

[2 of 2 positions shown; findings below may reference images not displayed]

FINDINGS: Cardiac silhouette is normal in size and configuration. No
mediastinal or hilar masses or evidence of adenopathy.

There is opacity along the oblique fissure, seen on the lateral
view, likely atelectasis. There is no convincing pneumonia and no
pulmonary edema. No pleural effusion pneumothorax.

Skeletal structures are demineralized but grossly intact.
IMPRESSION: No acute cardiopulmonary disease.

## 2016-10-19 IMAGING — CT CT BIOPSY
1 series · 1 of 20 positions shown · non-contrast
Comparison: none

CLINICAL DATA: Thrombocytopenia

[Series 2: localizer · axial · 5.0mm · 0.46mm/px · 1 of 20 slices shown]
[im 11/20]
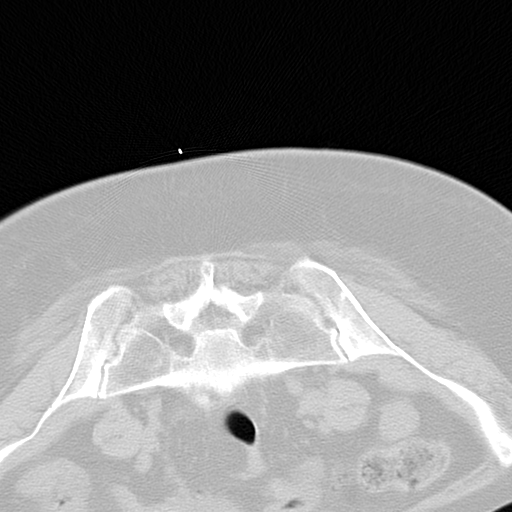

[1 of 20 positions shown; findings below may reference images not displayed]

EXAM:
CT-GUIDED BIOPSY BONE MARROW ASPIRATE AND CORE

MEDICATIONS AND MEDICAL HISTORY:
Versed 2 mg, Fentanyl 75 mcg.

Additional Medications: None.

ANESTHESIA/SEDATION:
Moderate sedation time: None minutes

PROCEDURE:
The procedure, risks, benefits, and alternatives were explained to
the patient. Questions regarding the procedure were encouraged and
answered. The patient understands and consents to the procedure.

The back was prepped with Betadine in a sterile fashion, and a
sterile drape was applied covering the operative field. A sterile
gown and sterile gloves were used for the procedure.

Under CT guidance, 11 gauge needle was advanced into the right iliac
bone via posterior approach. Aspirates and a core were obtain. Final
imaging was performed.

Patient tolerated the procedure well without complication. Vital
sign monitoring by nursing staff during the procedure will continue
as patient is in the special procedures unit for post procedure
observation.
FINDINGS: The images document guide needle placement within the right iliac
bone. Post biopsy images demonstrate no hemorrhage.

COMPLICATIONS:
None
IMPRESSION: Successful CT-guided core biopsy and bone marrow aspirate of the
right iliac bone.

## 2017-01-07 ENCOUNTER — Encounter: Payer: Self-pay | Admitting: *Deleted

## 2019-03-13 ENCOUNTER — Inpatient Hospital Stay (HOSPITAL_COMMUNITY): Payer: Medicaid Other | Admitting: Anesthesiology

## 2019-03-13 ENCOUNTER — Other Ambulatory Visit: Payer: Self-pay

## 2019-03-13 ENCOUNTER — Emergency Department (HOSPITAL_COMMUNITY): Payer: Medicaid Other

## 2019-03-13 ENCOUNTER — Encounter (HOSPITAL_COMMUNITY): Admission: EM | Disposition: A | Discharge status: Home / Self Care | Payer: Self-pay | Source: Home / Self Care

## 2019-03-13 ENCOUNTER — Encounter (HOSPITAL_COMMUNITY): Payer: Self-pay | Admitting: Certified Registered Nurse Anesthetist

## 2019-03-13 ENCOUNTER — Inpatient Hospital Stay (HOSPITAL_COMMUNITY): Payer: Medicaid Other

## 2019-03-13 ENCOUNTER — Inpatient Hospital Stay (HOSPITAL_COMMUNITY)
Admission: EM | Admit: 2019-03-13 | Discharge: 2019-03-16 | DRG: 511 | Disposition: A | Discharge status: Home / Self Care | Payer: Medicaid Other | Attending: General Surgery | Admitting: General Surgery

## 2019-03-13 DIAGNOSIS — M2578 Osteophyte, vertebrae: Secondary | ICD-10-CM | POA: Diagnosis present

## 2019-03-13 DIAGNOSIS — M47812 Spondylosis without myelopathy or radiculopathy, cervical region: Secondary | ICD-10-CM | POA: Diagnosis present

## 2019-03-13 DIAGNOSIS — G2401 Drug induced subacute dyskinesia: Secondary | ICD-10-CM | POA: Diagnosis present

## 2019-03-13 DIAGNOSIS — S12501A Unspecified nondisplaced fracture of sixth cervical vertebra, initial encounter for closed fracture: Secondary | ICD-10-CM | POA: Diagnosis present

## 2019-03-13 DIAGNOSIS — M5030 Other cervical disc degeneration, unspecified cervical region: Secondary | ICD-10-CM | POA: Diagnosis present

## 2019-03-13 DIAGNOSIS — S52133A Displaced fracture of neck of unspecified radius, initial encounter for closed fracture: Secondary | ICD-10-CM | POA: Diagnosis present

## 2019-03-13 DIAGNOSIS — S52091B Other fracture of upper end of right ulna, initial encounter for open fracture type I or II: Secondary | ICD-10-CM

## 2019-03-13 DIAGNOSIS — S12401A Unspecified nondisplaced fracture of fifth cervical vertebra, initial encounter for closed fracture: Secondary | ICD-10-CM | POA: Diagnosis present

## 2019-03-13 DIAGNOSIS — S12591A Other nondisplaced fracture of sixth cervical vertebra, initial encounter for closed fracture: Secondary | ICD-10-CM

## 2019-03-13 DIAGNOSIS — Z833 Family history of diabetes mellitus: Secondary | ICD-10-CM

## 2019-03-13 DIAGNOSIS — F79 Unspecified intellectual disabilities: Secondary | ICD-10-CM | POA: Diagnosis present

## 2019-03-13 DIAGNOSIS — W109XXA Fall (on) (from) unspecified stairs and steps, initial encounter: Secondary | ICD-10-CM | POA: Diagnosis present

## 2019-03-13 DIAGNOSIS — Q761 Klippel-Feil syndrome: Secondary | ICD-10-CM

## 2019-03-13 DIAGNOSIS — D696 Thrombocytopenia, unspecified: Secondary | ICD-10-CM | POA: Diagnosis present

## 2019-03-13 DIAGNOSIS — F209 Schizophrenia, unspecified: Secondary | ICD-10-CM | POA: Diagnosis present

## 2019-03-13 DIAGNOSIS — S12491A Other nondisplaced fracture of fifth cervical vertebra, initial encounter for closed fracture: Secondary | ICD-10-CM | POA: Diagnosis present

## 2019-03-13 DIAGNOSIS — W19XXXA Unspecified fall, initial encounter: Secondary | ICD-10-CM

## 2019-03-13 DIAGNOSIS — S52121A Displaced fracture of head of right radius, initial encounter for closed fracture: Secondary | ICD-10-CM | POA: Diagnosis present

## 2019-03-13 DIAGNOSIS — Z79899 Other long term (current) drug therapy: Secondary | ICD-10-CM

## 2019-03-13 DIAGNOSIS — W108XXA Fall (on) (from) other stairs and steps, initial encounter: Secondary | ICD-10-CM | POA: Diagnosis present

## 2019-03-13 DIAGNOSIS — D62 Acute posthemorrhagic anemia: Secondary | ICD-10-CM | POA: Diagnosis not present

## 2019-03-13 DIAGNOSIS — Z20828 Contact with and (suspected) exposure to other viral communicable diseases: Secondary | ICD-10-CM | POA: Diagnosis present

## 2019-03-13 DIAGNOSIS — Z79890 Hormone replacement therapy (postmenopausal): Secondary | ICD-10-CM | POA: Diagnosis not present

## 2019-03-13 DIAGNOSIS — R569 Unspecified convulsions: Secondary | ICD-10-CM | POA: Diagnosis present

## 2019-03-13 DIAGNOSIS — S52271B Monteggia's fracture of right ulna, initial encounter for open fracture type I or II: Secondary | ICD-10-CM | POA: Diagnosis present

## 2019-03-13 DIAGNOSIS — S12400A Unspecified displaced fracture of fifth cervical vertebra, initial encounter for closed fracture: Secondary | ICD-10-CM

## 2019-03-13 DIAGNOSIS — S52181B Other fracture of upper end of right radius, initial encounter for open fracture type I or II: Secondary | ICD-10-CM

## 2019-03-13 HISTORY — PX: ORIF ULNAR FRACTURE: SHX5417

## 2019-03-13 HISTORY — PX: RADIAL HEAD ARTHROPLASTY: SHX6044

## 2019-03-13 HISTORY — PX: I & D EXTREMITY: SHX5045

## 2019-03-13 LAB — CBC WITH DIFFERENTIAL/PLATELET
Abs Immature Granulocytes: 0.09 10*3/uL — ABNORMAL HIGH (ref 0.00–0.07)
Basophils Absolute: 0 10*3/uL (ref 0.0–0.1)
Basophils Relative: 0 %
Eosinophils Absolute: 0.1 10*3/uL (ref 0.0–0.5)
Eosinophils Relative: 1 %
HCT: 37.8 % (ref 36.0–46.0)
Hemoglobin: 11.7 g/dL — ABNORMAL LOW (ref 12.0–15.0)
Immature Granulocytes: 1 %
Lymphocytes Relative: 23 %
Lymphs Abs: 2.4 10*3/uL (ref 0.7–4.0)
MCH: 32 pg (ref 26.0–34.0)
MCHC: 31 g/dL (ref 30.0–36.0)
MCV: 103.3 fL — ABNORMAL HIGH (ref 80.0–100.0)
Monocytes Absolute: 0.9 10*3/uL (ref 0.1–1.0)
Monocytes Relative: 9 %
Neutro Abs: 6.8 10*3/uL (ref 1.7–7.7)
Neutrophils Relative %: 66 %
Platelets: 82 10*3/uL — ABNORMAL LOW (ref 150–400)
RBC: 3.66 MIL/uL — ABNORMAL LOW (ref 3.87–5.11)
RDW: 15.6 % — ABNORMAL HIGH (ref 11.5–15.5)
WBC: 10.3 10*3/uL (ref 4.0–10.5)
nRBC: 0 % (ref 0.0–0.2)

## 2019-03-13 LAB — TYPE AND SCREEN
ABO/RH(D): O POS
Antibody Screen: NEGATIVE

## 2019-03-13 LAB — BASIC METABOLIC PANEL
Anion gap: 11 (ref 5–15)
BUN: 36 mg/dL — ABNORMAL HIGH (ref 6–20)
CO2: 26 mmol/L (ref 22–32)
Calcium: 8.5 mg/dL — ABNORMAL LOW (ref 8.9–10.3)
Chloride: 106 mmol/L (ref 98–111)
Creatinine, Ser: 1.52 mg/dL — ABNORMAL HIGH (ref 0.44–1.00)
GFR calc Af Amer: 44 mL/min — ABNORMAL LOW (ref >60)
GFR calc non Af Amer: 38 mL/min — ABNORMAL LOW (ref >60)
Glucose, Bld: 108 mg/dL — ABNORMAL HIGH (ref 70–99)
Potassium: 4.4 mmol/L (ref 3.5–5.1)
Sodium: 143 mmol/L (ref 135–145)

## 2019-03-13 LAB — ABO/RH: ABO/RH(D): O POS

## 2019-03-13 LAB — SARS CORONAVIRUS 2 BY RT PCR (HOSPITAL ORDER, PERFORMED IN ~~LOC~~ HOSPITAL LAB): SARS Coronavirus 2: NEGATIVE

## 2019-03-13 SURGERY — IRRIGATION AND DEBRIDEMENT EXTREMITY
Anesthesia: General | Site: Arm Lower | Laterality: Right

## 2019-03-13 MED ORDER — ENOXAPARIN SODIUM 40 MG/0.4ML ~~LOC~~ SOLN: 40.0000 mg | SUBCUTANEOUS | Status: DC

## 2019-03-13 MED ORDER — FENTANYL CITRATE (PF) 250 MCG/5ML IJ SOLN
INTRAMUSCULAR | Status: DC | PRN
  Administered 2019-03-13: 25 ug via INTRAVENOUS
  Administered 2019-03-13: 50 ug via INTRAVENOUS
  Administered 2019-03-13: 25 ug via INTRAVENOUS
  Administered 2019-03-13: 50 ug via INTRAVENOUS

## 2019-03-13 MED ORDER — KETOROLAC TROMETHAMINE 15 MG/ML IJ SOLN
INTRAMUSCULAR | Status: AC
  Filled 2019-03-13: qty 1

## 2019-03-13 MED ORDER — LIDOCAINE 2% (20 MG/ML) 5 ML SYRINGE
INTRAMUSCULAR | Status: AC
  Filled 2019-03-13: qty 5

## 2019-03-13 MED ORDER — MIRTAZAPINE 15 MG PO TABS
15.0000 mg | ORAL_TABLET | Freq: Every day | ORAL | Status: DC
  Administered 2019-03-13 – 2019-03-15 (×3): 15 mg via ORAL
  Filled 2019-03-13 (×4): qty 1

## 2019-03-13 MED ORDER — DEXAMETHASONE SODIUM PHOSPHATE 10 MG/ML IJ SOLN
INTRAMUSCULAR | Status: AC
  Filled 2019-03-13: qty 1

## 2019-03-13 MED ORDER — FENTANYL CITRATE (PF) 250 MCG/5ML IJ SOLN
INTRAMUSCULAR | Status: AC
  Filled 2019-03-13: qty 5

## 2019-03-13 MED ORDER — DOCUSATE SODIUM 100 MG PO CAPS
100.0000 mg | ORAL_CAPSULE | Freq: Two times a day (BID) | ORAL | Status: DC
  Administered 2019-03-13 – 2019-03-16 (×7): 100 mg via ORAL
  Filled 2019-03-13 (×7): qty 1

## 2019-03-13 MED ORDER — FERROUS SULFATE 325 (65 FE) MG PO TABS
325.0000 mg | ORAL_TABLET | Freq: Every day | ORAL | Status: DC
  Administered 2019-03-13 – 2019-03-16 (×4): 325 mg via ORAL
  Filled 2019-03-13 (×4): qty 1

## 2019-03-13 MED ORDER — METOPROLOL TARTRATE 5 MG/5ML IV SOLN: 5.0000 mg | Freq: Four times a day (QID) | INTRAVENOUS | Status: DC | PRN

## 2019-03-13 MED ORDER — PROPOFOL 10 MG/ML IV BOLUS
INTRAVENOUS | Status: DC | PRN
  Administered 2019-03-13: 120 mg via INTRAVENOUS

## 2019-03-13 MED ORDER — BUPIVACAINE HCL (PF) 0.25 % IJ SOLN
INTRAMUSCULAR | Status: AC
  Filled 2019-03-13: qty 30

## 2019-03-13 MED ORDER — PROMETHAZINE HCL 25 MG/ML IJ SOLN: 6.2500 mg | INTRAMUSCULAR | Status: DC | PRN

## 2019-03-13 MED ORDER — KETOROLAC TROMETHAMINE 15 MG/ML IJ SOLN
15.0000 mg | Freq: Once | INTRAMUSCULAR | Status: AC
  Administered 2019-03-13: 15 mg via INTRAVENOUS

## 2019-03-13 MED ORDER — CEFAZOLIN SODIUM-DEXTROSE 1-4 GM/50ML-% IV SOLN
1.0000 g | Freq: Three times a day (TID) | INTRAVENOUS | Status: DC
  Administered 2019-03-13: 2 g via INTRAVENOUS
  Filled 2019-03-13: qty 50

## 2019-03-13 MED ORDER — DEXAMETHASONE SODIUM PHOSPHATE 10 MG/ML IJ SOLN
INTRAMUSCULAR | Status: DC | PRN
  Administered 2019-03-13: 5 mg via INTRAVENOUS

## 2019-03-13 MED ORDER — CALCIUM CARBONATE-VITAMIN D 500-200 MG-UNIT PO TABS
1.0000 | ORAL_TABLET | Freq: Every day | ORAL | Status: DC
  Administered 2019-03-13 – 2019-03-16 (×4): 1 via ORAL
  Filled 2019-03-13 (×5): qty 1

## 2019-03-13 MED ORDER — CHLORHEXIDINE GLUCONATE 4 % EX LIQD
60.0000 mL | Freq: Once | CUTANEOUS | Status: DC
  Filled 2019-03-13: qty 60

## 2019-03-13 MED ORDER — ONDANSETRON HCL 4 MG/2ML IJ SOLN: 4.0000 mg | Freq: Four times a day (QID) | INTRAMUSCULAR | Status: DC | PRN

## 2019-03-13 MED ORDER — LACTATED RINGERS IV SOLN
INTRAVENOUS | Status: DC | PRN
  Administered 2019-03-13: 15:00:00 via INTRAVENOUS

## 2019-03-13 MED ORDER — SUGAMMADEX SODIUM 200 MG/2ML IV SOLN
INTRAVENOUS | Status: DC | PRN
  Administered 2019-03-13: 130 mg via INTRAVENOUS

## 2019-03-13 MED ORDER — LIDOCAINE 2% (20 MG/ML) 5 ML SYRINGE
INTRAMUSCULAR | Status: DC | PRN
  Administered 2019-03-13: 60 mg via INTRAVENOUS

## 2019-03-13 MED ORDER — ROCURONIUM BROMIDE 10 MG/ML (PF) SYRINGE
PREFILLED_SYRINGE | INTRAVENOUS | Status: AC
  Filled 2019-03-13: qty 10

## 2019-03-13 MED ORDER — MORPHINE SULFATE (PF) 2 MG/ML IV SOLN
1.0000 mg | INTRAVENOUS | Status: DC | PRN
  Administered 2019-03-13 – 2019-03-14 (×2): 2 mg via INTRAVENOUS
  Filled 2019-03-13 (×2): qty 1

## 2019-03-13 MED ORDER — LEVOTHYROXINE SODIUM 50 MCG PO TABS
50.0000 ug | ORAL_TABLET | Freq: Every day | ORAL | Status: DC
  Administered 2019-03-13 – 2019-03-16 (×4): 50 ug via ORAL
  Filled 2019-03-13 (×4): qty 1

## 2019-03-13 MED ORDER — PANTOPRAZOLE SODIUM 40 MG IV SOLR: 40.0000 mg | Freq: Every day | INTRAVENOUS | Status: DC

## 2019-03-13 MED ORDER — 0.9 % SODIUM CHLORIDE (POUR BTL) OPTIME
TOPICAL | Status: DC | PRN
  Administered 2019-03-13: 1000 mL

## 2019-03-13 MED ORDER — ONDANSETRON HCL 4 MG/2ML IJ SOLN
INTRAMUSCULAR | Status: DC | PRN
  Administered 2019-03-13: 4 mg via INTRAVENOUS

## 2019-03-13 MED ORDER — ALENDRONATE SODIUM 70 MG PO TABS
70.0000 mg | ORAL_TABLET | ORAL | Status: DC
  Administered 2019-03-16: 70 mg via ORAL
  Filled 2019-03-13: qty 1

## 2019-03-13 MED ORDER — IOHEXOL 300 MG/ML  SOLN
100.0000 mL | Freq: Once | INTRAMUSCULAR | Status: AC | PRN
  Administered 2019-03-13: 100 mL via INTRAVENOUS

## 2019-03-13 MED ORDER — OXYCODONE HCL 5 MG PO TABS
5.0000 mg | ORAL_TABLET | ORAL | Status: DC | PRN
  Administered 2019-03-13: 5 mg via ORAL
  Administered 2019-03-14: 10 mg via ORAL
  Filled 2019-03-13: qty 2
  Filled 2019-03-13: qty 1

## 2019-03-13 MED ORDER — DIVALPROEX SODIUM ER 500 MG PO TB24
1500.0000 mg | ORAL_TABLET | Freq: Every day | ORAL | Status: DC
  Administered 2019-03-13 – 2019-03-15 (×3): 1500 mg via ORAL
  Filled 2019-03-13 (×4): qty 3

## 2019-03-13 MED ORDER — FENTANYL CITRATE (PF) 100 MCG/2ML IJ SOLN
50.0000 ug | Freq: Once | INTRAMUSCULAR | Status: AC
  Administered 2019-03-13: 50 ug via INTRAVENOUS
  Filled 2019-03-13: qty 2

## 2019-03-13 MED ORDER — CYPROHEPTADINE HCL 4 MG PO TABS
4.0000 mg | ORAL_TABLET | Freq: Every day | ORAL | Status: DC
  Administered 2019-03-13 – 2019-03-15 (×3): 4 mg via ORAL
  Filled 2019-03-13 (×4): qty 1

## 2019-03-13 MED ORDER — CEFAZOLIN SODIUM-DEXTROSE 1-4 GM/50ML-% IV SOLN
1.0000 g | Freq: Three times a day (TID) | INTRAVENOUS | Status: AC
  Administered 2019-03-13 – 2019-03-15 (×7): 1 g via INTRAVENOUS
  Filled 2019-03-13 (×7): qty 50

## 2019-03-13 MED ORDER — SODIUM CHLORIDE 0.9 % IV SOLN
INTRAVENOUS | Status: DC
  Administered 2019-03-13: 100 mL/h via INTRAVENOUS

## 2019-03-13 MED ORDER — FENTANYL CITRATE (PF) 100 MCG/2ML IJ SOLN: 25.0000 ug | INTRAMUSCULAR | Status: DC | PRN

## 2019-03-13 MED ORDER — TETANUS-DIPHTH-ACELL PERTUSSIS 5-2.5-18.5 LF-MCG/0.5 IM SUSP
0.5000 mL | Freq: Once | INTRAMUSCULAR | Status: AC
  Administered 2019-03-13: 0.5 mL via INTRAMUSCULAR
  Filled 2019-03-13: qty 0.5

## 2019-03-13 MED ORDER — ONDANSETRON 4 MG PO TBDP: 4.0000 mg | ORAL_TABLET | Freq: Four times a day (QID) | ORAL | Status: DC | PRN

## 2019-03-13 MED ORDER — LORATADINE 10 MG PO TABS
10.0000 mg | ORAL_TABLET | Freq: Every day | ORAL | Status: DC
  Administered 2019-03-13 – 2019-03-16 (×4): 10 mg via ORAL
  Filled 2019-03-13 (×4): qty 1

## 2019-03-13 MED ORDER — OLANZAPINE 5 MG PO TBDP
15.0000 mg | ORAL_TABLET | Freq: Every day | ORAL | Status: DC
  Administered 2019-03-13 – 2019-03-15 (×3): 15 mg via ORAL
  Filled 2019-03-13 (×4): qty 3

## 2019-03-13 MED ORDER — MEPERIDINE HCL 25 MG/ML IJ SOLN: 6.2500 mg | INTRAMUSCULAR | Status: DC | PRN

## 2019-03-13 MED ORDER — TRAZODONE HCL 150 MG PO TABS
300.0000 mg | ORAL_TABLET | Freq: Every day | ORAL | Status: DC
  Administered 2019-03-13 – 2019-03-15 (×3): 300 mg via ORAL
  Filled 2019-03-13 (×3): qty 2

## 2019-03-13 MED ORDER — ADULT MULTIVITAMIN W/MINERALS CH
1.0000 | ORAL_TABLET | Freq: Every day | ORAL | Status: DC
  Administered 2019-03-13 – 2019-03-16 (×4): 1 via ORAL
  Filled 2019-03-13 (×4): qty 1

## 2019-03-13 MED ORDER — PANTOPRAZOLE SODIUM 40 MG PO TBEC
40.0000 mg | DELAYED_RELEASE_TABLET | Freq: Every day | ORAL | Status: DC
  Administered 2019-03-13 – 2019-03-16 (×4): 40 mg via ORAL
  Filled 2019-03-13 (×4): qty 1

## 2019-03-13 MED ORDER — ONDANSETRON HCL 4 MG/2ML IJ SOLN
INTRAMUSCULAR | Status: AC
  Filled 2019-03-13: qty 2

## 2019-03-13 MED ORDER — MIDAZOLAM HCL 2 MG/2ML IJ SOLN
INTRAMUSCULAR | Status: AC
  Filled 2019-03-13: qty 2

## 2019-03-13 MED ORDER — ROCURONIUM BROMIDE 10 MG/ML (PF) SYRINGE
PREFILLED_SYRINGE | INTRAVENOUS | Status: DC | PRN
  Administered 2019-03-13: 50 mg via INTRAVENOUS

## 2019-03-13 MED ORDER — SODIUM CHLORIDE 0.9 % IR SOLN
Status: DC | PRN
  Administered 2019-03-13: 3000 mL

## 2019-03-13 MED ORDER — ACETAMINOPHEN 325 MG PO TABS
650.0000 mg | ORAL_TABLET | ORAL | Status: DC | PRN
  Administered 2019-03-16: 650 mg via ORAL
  Filled 2019-03-13: qty 2

## 2019-03-13 MED ORDER — LACTATED RINGERS IV SOLN: INTRAVENOUS | Status: DC

## 2019-03-13 MED ORDER — POVIDONE-IODINE 10 % EX SWAB: 2.0000 "application " | Freq: Once | CUTANEOUS | Status: DC

## 2019-03-13 MED ORDER — HYDROXYZINE HCL 25 MG PO TABS
50.0000 mg | ORAL_TABLET | Freq: Two times a day (BID) | ORAL | Status: DC
  Administered 2019-03-13 – 2019-03-16 (×7): 50 mg via ORAL
  Filled 2019-03-13 (×7): qty 2

## 2019-03-13 MED ORDER — CEFAZOLIN SODIUM-DEXTROSE 2-4 GM/100ML-% IV SOLN
2.0000 g | Freq: Once | INTRAVENOUS | Status: AC
  Administered 2019-03-13: 2 g via INTRAVENOUS
  Filled 2019-03-13: qty 100

## 2019-03-13 MED ORDER — BUPIVACAINE HCL (PF) 0.25 % IJ SOLN
INTRAMUSCULAR | Status: DC | PRN
  Administered 2019-03-13: 20 mL

## 2019-03-13 SURGICAL SUPPLY — 94 items
BIT DRILL 2 QR W/DEPTH MARKS (BIT) ×3
BIT DRILL 2.7 QR (BIT) ×3
BIT DRILL 2.8 QR W/DEPTH MARKS (BIT) ×2 IMPLANT
BIT DRILL 2MM QR W/DEPTH MARKS (BIT) IMPLANT
BIT DRILL SURGIBIT 2.7MM QR (BIT) IMPLANT
BLADE AVERAGE 25MMX9MM (BLADE) ×1
BLADE AVERAGE 25X9 (BLADE) ×1 IMPLANT
BLADE CLIPPER SURG (BLADE) IMPLANT
BLADE LONG MED 31MMX9MM (MISCELLANEOUS)
BLADE LONG MED 31X9 (MISCELLANEOUS) ×1 IMPLANT
BLADE SURG 10 STRL SS (BLADE) ×2 IMPLANT
BNDG ADH 5X4 AIR PERM ELC (GAUZE/BANDAGES/DRESSINGS) ×1
BNDG CMPR 9X4 STRL LF SNTH (GAUZE/BANDAGES/DRESSINGS) ×1
BNDG COHESIVE 4X5 TAN STRL (GAUZE/BANDAGES/DRESSINGS) ×1 IMPLANT
BNDG COHESIVE 4X5 WHT NS (GAUZE/BANDAGES/DRESSINGS) ×2 IMPLANT
BNDG CONFORM 2 STRL LF (GAUZE/BANDAGES/DRESSINGS) IMPLANT
BNDG ELASTIC 3X5.8 VLCR STR LF (GAUZE/BANDAGES/DRESSINGS) ×3 IMPLANT
BNDG ELASTIC 4X5.8 VLCR STR LF (GAUZE/BANDAGES/DRESSINGS) ×3 IMPLANT
BNDG ESMARK 4X9 LF (GAUZE/BANDAGES/DRESSINGS) ×3 IMPLANT
BNDG GAUZE ELAST 4 BULKY (GAUZE/BANDAGES/DRESSINGS) ×5 IMPLANT
CORD BIPOLAR FORCEPS 12FT (ELECTRODE) ×3 IMPLANT
COVER SURGICAL LIGHT HANDLE (MISCELLANEOUS) ×3 IMPLANT
COVER WAND RF STERILE (DRAPES) ×3 IMPLANT
CUFF TOURN SGL QUICK 18X4 (TOURNIQUET CUFF) ×3 IMPLANT
CUFF TOURN SGL QUICK 24 (TOURNIQUET CUFF)
CUFF TRNQT CYL 24X4X16.5-23 (TOURNIQUET CUFF) IMPLANT
DECANTER SPIKE VIAL GLASS SM (MISCELLANEOUS) ×2 IMPLANT
DRAIN TLS ROUND 10FR (DRAIN) IMPLANT
DRAPE INCISE IOBAN 66X45 STRL (DRAPES) ×1 IMPLANT
DRAPE OEC MINIVIEW 54X84 (DRAPES) ×2 IMPLANT
DRAPE SURG 17X23 STRL (DRAPES) ×3 IMPLANT
DRSG ADAPTIC 3X8 NADH LF (GAUZE/BANDAGES/DRESSINGS) ×1 IMPLANT
GAUZE SPONGE 4X4 12PLY STRL (GAUZE/BANDAGES/DRESSINGS) ×3 IMPLANT
GAUZE SPONGE 4X4 16PLY XRAY LF (GAUZE/BANDAGES/DRESSINGS) ×4 IMPLANT
GAUZE XEROFORM 1X8 LF (GAUZE/BANDAGES/DRESSINGS) ×3 IMPLANT
GAUZE XEROFORM 5X9 LF (GAUZE/BANDAGES/DRESSINGS) ×2 IMPLANT
GLOVE BIO SURGEON STRL SZ7.5 (GLOVE) ×3 IMPLANT
GLOVE BIOGEL M 8.0 STRL (GLOVE) ×3 IMPLANT
GLOVE BIOGEL PI IND STRL 8 (GLOVE) ×1 IMPLANT
GLOVE BIOGEL PI INDICATOR 8 (GLOVE) ×2
GLOVE SS BIOGEL STRL SZ 8 (GLOVE) ×1 IMPLANT
GLOVE SUPERSENSE BIOGEL SZ 8 (GLOVE) ×2
GOWN STRL REUS W/ TWL LRG LVL3 (GOWN DISPOSABLE) ×3 IMPLANT
GOWN STRL REUS W/ TWL XL LVL3 (GOWN DISPOSABLE) ×3 IMPLANT
GOWN STRL REUS W/TWL LRG LVL3 (GOWN DISPOSABLE) ×9
GOWN STRL REUS W/TWL XL LVL3 (GOWN DISPOSABLE) ×9
GUIDEWIRE ORTH 6X062XTROC NS (WIRE) IMPLANT
HEAD RADIAL ARH 2H 18 RT (Screw) ×2 IMPLANT
K-WIRE .062 (WIRE) ×3
KIT BASIN OR (CUSTOM PROCEDURE TRAY) ×3 IMPLANT
KIT TURNOVER KIT B (KITS) ×3 IMPLANT
LOOP VESSEL MAXI BLUE (MISCELLANEOUS) IMPLANT
MANIFOLD NEPTUNE II (INSTRUMENTS) ×3 IMPLANT
NDL HYPO 25GX1X1/2 BEV (NEEDLE) IMPLANT
NEEDLE 22X1 1/2 (OR ONLY) (NEEDLE) IMPLANT
NEEDLE HYPO 25GX1X1/2 BEV (NEEDLE) ×3 IMPLANT
NS IRRIG 1000ML POUR BTL (IV SOLUTION) ×3 IMPLANT
PACK ORTHO EXTREMITY (CUSTOM PROCEDURE TRAY) ×3 IMPLANT
PAD ARMBOARD 7.5X6 YLW CONV (MISCELLANEOUS) ×6 IMPLANT
PAD CAST 4YDX4 CTTN HI CHSV (CAST SUPPLIES) ×2 IMPLANT
PADDING CAST COTTON 4X4 STRL (CAST SUPPLIES) ×6
PLATE ULNA MIDSHAFT 8 HOLE (Plate) ×2 IMPLANT
PUTTY DBX 2.5CC (Putty) ×3 IMPLANT
PUTTY DBX 2.5CC DEPUY (Putty) IMPLANT
SCREW LOCK 12X3.5X HEXALOBE (Screw) IMPLANT
SCREW LOCKING 3.5X12 (Screw) ×6 IMPLANT
SCREW NONLOCK HEX 2.7X12 (Screw) ×2 IMPLANT
SCREW NONLOCK HEX 3.5X12 (Screw) ×8 IMPLANT
SET CYSTO W/LG BORE CLAMP LF (SET/KITS/TRAYS/PACK) ×3 IMPLANT
SOL PREP POV-IOD 4OZ 10% (MISCELLANEOUS) ×9 IMPLANT
SPLINT FIBERGLASS 3X35 (CAST SUPPLIES) ×2 IMPLANT
SPONGE LAP 18X18 RF (DISPOSABLE) ×2 IMPLANT
SPONGE LAP 4X18 RFD (DISPOSABLE) ×3 IMPLANT
STEM MORSE TAPER 6 LNG (Stem) ×2 IMPLANT
SUT PROLENE 3 0 PS 2 (SUTURE) ×3 IMPLANT
SUT PROLENE 4 0 PS 2 18 (SUTURE) ×4 IMPLANT
SUT VIC AB 2-0 CT1 27 (SUTURE)
SUT VIC AB 2-0 CT1 TAPERPNT 27 (SUTURE) IMPLANT
SUT VIC AB 3-0 FS2 27 (SUTURE) ×2 IMPLANT
SUT VIC AB 3-0 SH 27 (SUTURE) ×6
SUT VIC AB 3-0 SH 27X BRD (SUTURE) IMPLANT
SWAB CULTURE ESWAB REG 1ML (MISCELLANEOUS) IMPLANT
SYR CONTROL 10ML LL (SYRINGE) ×2 IMPLANT
SYS STEM ANAT RAD HEAD 8.0MMX2 (Orthopedic Implant) ×3 IMPLANT
SYSTEM CHEST DRAIN TLS 7FR (DRAIN) IMPLANT
SYSTEM STEM ANA RAD HD 8.0MMX2 (Orthopedic Implant) IMPLANT
TOWEL GREEN STERILE (TOWEL DISPOSABLE) ×6 IMPLANT
TOWEL GREEN STERILE FF (TOWEL DISPOSABLE) ×3 IMPLANT
TUBE CONNECTING 12'X1/4 (SUCTIONS) ×1
TUBE CONNECTING 12X1/4 (SUCTIONS) ×2 IMPLANT
TUBE EVACUATION TLS (MISCELLANEOUS) IMPLANT
UNDERPAD 30X30 (UNDERPADS AND DIAPERS) ×3 IMPLANT
WATER STERILE IRR 1000ML POUR (IV SOLUTION) ×3 IMPLANT
YANKAUER SUCT BULB TIP NO VENT (SUCTIONS) ×3 IMPLANT

## 2019-03-13 NOTE — ED Triage Notes (Signed)
Pt presents to ED from home BIB [facility]. Per EMS pt had witnessed fall, face first down 8 steps. Per EMS pt appears to have multiple compound fracture to R arm. No LOC, no blood thinners. Pt moving arm normally without pain. EMS VSS. Pt baseline confused with mental delays.

## 2019-03-13 NOTE — Anesthesia Preprocedure Evaluation (Addendum)
Anesthesia Evaluation  Patient identified by MRN, date of birth, ID band Patient confused    Reviewed: Allergy & Precautions, NPO status , Patient's Chart, lab work & pertinent test results, Unable to perform ROS - Chart review only  Airway Mallampati: III     Mouth opening: Limited Mouth Opening  Dental  (+) Poor Dentition   Pulmonary neg pulmonary ROS,    Pulmonary exam normal breath sounds clear to auscultation       Cardiovascular negative cardio ROS Normal cardiovascular exam     Neuro/Psych PSYCHIATRIC DISORDERS Bipolar Disorder Schizophrenia    GI/Hepatic negative GI ROS, Neg liver ROS,   Endo/Other  Hypothyroidism   Renal/GU Renal diseaseCKD III     Musculoskeletal negative musculoskeletal ROS (+)   Abdominal Normal abdominal exam  (+)   Peds  Hematology negative hematology ROS (+)   Anesthesia Other Findings   Reproductive/Obstetrics                             Anesthesia Physical Anesthesia Plan  ASA: III  Anesthesia Plan: General   Post-op Pain Management:    Induction: Intravenous  PONV Risk Score and Plan: Ondansetron  Airway Management Planned: Oral ETT  Additional Equipment: None  Intra-op Plan:   Post-operative Plan:   Informed Consent: I have reviewed the patients History and Physical, chart, labs and discussed the procedure including the risks, benefits and alternatives for the proposed anesthesia with the patient or authorized representative who has indicated his/her understanding and acceptance.     Dental advisory given  Plan Discussed with: CRNA  Anesthesia Plan Comments:         Anesthesia Quick Evaluation

## 2019-03-13 NOTE — Transfer of Care (Signed)
Immediate Anesthesia Transfer of Care Note  Patient: Diamond Bowman  Procedure(s) Performed: IRRIGATION AND DEBRIDEMENT EXTREMITY (Right Arm Lower) OPEN REDUCTION INTERNAL FIXATION (ORIF) ULNAR FRACTURE (Right Arm Lower) RADIAL HEAD ARTHROPLASTY (Right Arm Lower)  Patient Location: PACU  Anesthesia Type:General  Level of Consciousness: drowsy and patient cooperative  Airway & Oxygen Therapy: Patient Spontanous Breathing and Patient connected to face mask oxygen  Post-op Assessment: Report given to RN  Post vital signs: Reviewed and stable  Last Vitals:  Vitals Value Taken Time  BP    Temp    Pulse 111 03/13/19 1739  Resp    SpO2 94 % 03/13/19 1739  Vitals shown include unvalidated device data.  Last Pain:  Vitals:   03/13/19 1245  TempSrc:   PainSc: 0-No pain         Complications: No apparent anesthesia complications

## 2019-03-13 NOTE — ED Notes (Signed)
ED TO INPATIENT HANDOFF REPORT  ED Nurse Name and Phone #: Harlon Flor [phone]  S Name/Age/Gender Diamond Bowman 53 y.o. female Room/Bed: 026C/026C  Code Status   Code Status: Full Code  Home/SNF/Other Home Patient oriented to: self, place, time and situation Is this baseline? Yes   Triage Complete: Triage complete  Chief Complaint fall  Triage Note Pt presents to ED from home BIB [facility]. Per EMS pt had witnessed fall, face first down 8 steps. Per EMS pt appears to have multiple compound fracture to R arm. No LOC, no blood thinners. Pt moving arm normally without pain. EMS VSS. Pt baseline confused with mental delays.   Allergies No Known Allergies  Level of Care/Admitting Diagnosis ED Disposition    ED Disposition Condition [facility]: [facility] [[id number]]  Level of Care: Med-Surg [16]  Covid Evaluation: Confirmed COVID Negative  Diagnosis: Fall down stairs, initial encounter [id number]  Admitting Physician: Jesusita Oka [id number]  Attending Physician: TRAUMA MD [[id number]]  Estimated length of stay: past midnight tomorrow  Certification:: I certify this patient will need inpatient services for at least 2 midnights  Bed request comments: 5N  PT Class (Do Not Modify): Inpatient [101]  PT Acc Code (Do Not Modify): Private [1]       B Medical/Surgery History Past Medical History:  Diagnosis Date  . Fall   . Heart murmur   . Mental retardation   . Schizophrenia   . Seizures   . Tardive dyskinesia    No past surgical history on file.   A IV Location/Drains/Wounds Patient Lines/Drains/Airways Status   Active Line/Drains/Airways    Name:   Placement date:   Placement time:   Site:   Days:   Peripheral IV 03/13/19 Left;Anterior Forearm   03/13/19    1045    Forearm   less than 1   Peripheral IV 03/13/19 Left Hand   03/13/19    1040    Hand   less than 1          Intake/Output Last 24 hours No intake or output data in the 24  hours ending 03/13/19 1245  Labs/Imaging Results for orders placed or performed during the hospital encounter of 03/13/19 (from the past 48 hour(s))  Basic metabolic panel     Status: Abnormal   Collection Time: 03/13/19  7:01 AM  Result Value Ref Range   Sodium 143 135 - 145 mmol/L   Potassium 4.4 3.5 - 5.1 mmol/L   Chloride 106 98 - 111 mmol/L   CO2 26 22 - 32 mmol/L   Glucose, Bld 108 (H) 70 - 99 mg/dL   BUN 36 (H) 6 - 20 mg/dL   Creatinine, Ser 1.52 (H) 0.44 - 1.00 mg/dL   Calcium 8.5 (L) 8.9 - 10.3 mg/dL   GFR calc non Af Amer 38 (L) >60 mL/min   GFR calc Af Amer 44 (L) >60 mL/min   Anion gap 11 5 - 15    Comment: Performed at [facility], [address] [place], [place] [address]  CBC with Differential     Status: Abnormal   Collection Time: 03/13/19  7:01 AM  Result Value Ref Range   WBC 10.3 4.0 - 10.5 K/uL   RBC 3.66 (L) 3.87 - 5.11 MIL/uL   Hemoglobin 11.7 (L) 12.0 - 15.0 g/dL   HCT 37.8 36.0 - 46.0 %   MCV 103.3 (H) 80.0 - 100.0 fL   MCH  32.0 26.0 - 34.0 pg   MCHC 31.0 30.0 - 36.0 g/dL   RDW 15.6 (H) 11.5 - 15.5 %   Platelets 82 (L) 150 - 400 K/uL    Comment: REPEATED TO VERIFY PLATELET COUNT CONFIRMED BY SMEAR Immature Platelet Fraction may be clinically indicated, consider ordering this additional test [id number]    nRBC 0.0 0.0 - 0.2 %   Neutrophils Relative % 66 %   Neutro Abs 6.8 1.7 - 7.7 K/uL   Lymphocytes Relative 23 %   Lymphs Abs 2.4 0.7 - 4.0 K/uL   Monocytes Relative 9 %   Monocytes Absolute 0.9 0.1 - 1.0 K/uL   Eosinophils Relative 1 %   Eosinophils Absolute 0.1 0.0 - 0.5 K/uL   Basophils Relative 0 %   Basophils Absolute 0.0 0.0 - 0.1 K/uL   Immature Granulocytes 1 %   Abs Immature Granulocytes 0.09 (H) 0.00 - 0.07 K/uL    Comment: Performed at [facility], [address], [place], [place] [address]  SARS Coronavirus 2 by RT PCR (hospital order, performed in [facility] hospital lab) Nasopharyngeal Nasopharyngeal Swab      Status: None   Collection Time: 03/13/19  9:00 AM   Specimen: Nasopharyngeal Swab  Result Value Ref Range   SARS Coronavirus 2 NEGATIVE NEGATIVE    Comment: (NOTE) If result is NEGATIVE SARS-CoV-2 target nucleic acids are NOT DETECTED. The SARS-CoV-2 RNA is generally detectable in upper and lower  respiratory specimens during the acute phase of infection. The lowest  concentration of SARS-CoV-2 viral copies this assay can detect is 250  copies / mL. A negative result does not preclude SARS-CoV-2 infection  and should not be used as the sole basis for treatment or other  patient management decisions.  A negative result may occur with  improper specimen collection / handling, submission of specimen other  than nasopharyngeal swab, presence of viral mutation(s) within the  areas targeted by this assay, and inadequate number of viral copies  (<250 copies / mL). A negative result must be combined with clinical  observations, patient history, and epidemiological information. If result is POSITIVE SARS-CoV-2 target nucleic acids are DETECTED. The SARS-CoV-2 RNA is generally detectable in upper and lower  respiratory specimens dur ing the acute phase of infection.  Positive  results are indicative of active infection with SARS-CoV-2.  Clinical  correlation with patient history and other diagnostic information is  necessary to determine patient infection status.  Positive results do  not rule out bacterial infection or co-infection with other viruses. If result is PRESUMPTIVE POSTIVE SARS-CoV-2 nucleic acids MAY BE PRESENT.   A presumptive positive result was obtained on the submitted specimen  and confirmed on repeat testing.  While 2019 novel coronavirus  (SARS-CoV-2) nucleic acids may be present in the submitted sample  additional confirmatory testing may be necessary for epidemiological  and / or clinical management purposes  to differentiate between  SARS-CoV-2 and other Sarbecovirus  currently known to infect humans.  If clinically indicated additional testing with an alternate test  methodology [phone]) is advised. The SARS-CoV-2 RNA is generally  detectable in upper and lower respiratory sp ecimens during the acute  phase of infection. The expected result is Negative. Fact Sheet for Patients:  StrictlyIdeas.no Fact Sheet for Healthcare Providers: BankingDealers.co.za This test is not yet approved or cleared by the [place] FDA and has been authorized for detection and/or diagnosis of SARS-CoV-2 by FDA under an Emergency Use Authorization (EUA).  This  EUA will remain in effect (meaning this test can be used) for the duration of the COVID-19 declaration under Section 564(b)(1) of the Act, 21 U.S.C. section 360bbb-3(b)(1), unless the authorization is terminated or revoked sooner. Performed at [facility], [place] [address], [place], [place] [address]   Type and screen [place]     Status: None   Collection Time: 03/13/19 11:15 AM  Result Value Ref Range   ABO/RH(D) O POS    Antibody Screen NEG    Sample Expiration      03/16/2019,2359 Performed at [facility], [place] [address], [place], [place] [address]    Dg Elbow Complete Right  Result Date: 03/13/2019 CLINICAL DATA:  Pain following fall EXAM: RIGHT ELBOW - COMPLETE 3+ VIEW COMPARISON:  None. FINDINGS: Frontal, lateral, and bilateral oblique views were obtained. There is a comminuted fracture through the proximal radial metaphysis with lateral displacement of the distal major fracture fragment with respect to the major proximal fragment. There is a comminuted fracture of the proximal ulnar diaphysis with anterior and lateral displacement of the distal major fracture fragment with respect proximal fragment. Soft tissue air is noted in these areas suggesting open type fracture. There is no frank dislocation. There are foci of  calcification in the joint which likely represent arthropathic changes. IMPRESSION: Comminuted fractures of proximal radius and ulna with displacement of fracture fragments. No gross dislocation. Areas of soft tissue air in the region of the proximal ulnar fracture suggests open type fracture. Intra-articular calcification is likely of arthropathic etiology. Electronically Signed   By: Lowella Grip III M.D.   On: 03/13/2019 08:25   Dg Forearm Right  Result Date: 03/13/2019 CLINICAL DATA:  Pain following fall EXAM: RIGHT FOREARM - 2 VIEW COMPARISON:  None. FINDINGS: Frontal and lateral views obtained. There is a comminuted fracture of the proximal radial metaphysis with lateral displacement and volar angulation of the distal fracture fragment with respect proximal fragment. There is a comminuted fracture of the proximal ulnar diaphysis with volar and medial displacement of the distal major fracture fragment respect major proximal fragment. Soft tissue air in this area suggests open type fracture. No more distal fractures are evident. No dislocation. The joint spaces appear grossly intact. IMPRESSION: Comminuted fractures of the proximal radius and ulna with soft tissue air suggesting open fracture. No dislocation. No appreciable joint space narrowing or erosion. Electronically Signed   By: Lowella Grip III M.D.   On: 03/13/2019 08:26   Ct Head Wo Contrast  Result Date: 03/13/2019 CLINICAL DATA:  Fall down steps. EXAM: CT HEAD WITHOUT CONTRAST CT CERVICAL SPINE WITHOUT CONTRAST TECHNIQUE: Multidetector CT imaging of the head and cervical spine was performed following the standard protocol without intravenous contrast. Multiplanar CT image reconstructions of the cervical spine were also generated. COMPARISON:  01/04/2015 FINDINGS: CT HEAD FINDINGS Brain: No acute intracranial abnormality. Specifically, no hemorrhage, hydrocephalus, mass lesion, acute infarction, or significant intracranial injury.  Vascular: No hyperdense vessel or unexpected calcification. Skull: No acute calvarial abnormality. Sinuses/Orbits: Short air-fluid levels and mild mucosal thickening in the maxillary sinuses. Mastoid air cells clear. Other: None CT CERVICAL SPINE FINDINGS Alignment: No subluxation Skull base and vertebrae: Fractures through the right lateral C5 and C6 masses. No vertebral body involvement. Soft tissues and spinal canal: No prevertebral fluid or swelling. No visible canal hematoma. 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 Large flowing osteophytes anteriorly. Mild degenerative facet disease. Upper chest: No acute findings Other: None IMPRESSION: No  acute intracranial abnormality. Nondisplaced fractures through the right C5 and C6 lateral masses. These results were called by telephone at the time of interpretation on 03/13/2019 at 8:41 am to provider [facility] , who verbally acknowledged these results. Electronically Signed   By: Rolm Baptise M.D.   On: 03/13/2019 08:42   Ct Cervical Spine Wo Contrast  Result Date: 03/13/2019 CLINICAL DATA:  Fall down steps. EXAM: CT HEAD WITHOUT CONTRAST CT CERVICAL SPINE WITHOUT CONTRAST TECHNIQUE: Multidetector CT imaging of the head and cervical spine was performed following the standard protocol without intravenous contrast. Multiplanar CT image reconstructions of the cervical spine were also generated. COMPARISON:  01/04/2015 FINDINGS: CT HEAD FINDINGS Brain: No acute intracranial abnormality. Specifically, no hemorrhage, hydrocephalus, mass lesion, acute infarction, or significant intracranial injury. Vascular: No hyperdense vessel or unexpected calcification. Skull: No acute calvarial abnormality. Sinuses/Orbits: Short air-fluid levels and mild mucosal thickening in the maxillary sinuses. Mastoid air cells clear. Other: None CT CERVICAL SPINE FINDINGS Alignment: No subluxation Skull base and vertebrae: Fractures through the right lateral C5 and C6 masses. No vertebral  body involvement. Soft tissues and spinal canal: No prevertebral fluid or swelling. No visible canal hematoma. 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 Large flowing osteophytes anteriorly. Mild degenerative facet disease. Upper chest: No acute findings Other: None IMPRESSION: No acute intracranial abnormality. Nondisplaced fractures through the right C5 and C6 lateral masses. These results were called by telephone at the time of interpretation on 03/13/2019 at 8:41 am to provider [facility] , who verbally acknowledged these results. Electronically Signed   By: Rolm Baptise M.D.   On: 03/13/2019 08:42   Dg Humerus Right  Result Date: 03/13/2019 CLINICAL DATA:  Fall down stairs today. Right arm pain. Initial encounter. EXAM: RIGHT HUMERUS - 2+ VIEW COMPARISON:  None. FINDINGS: There is no evidence of fracture or other focal bone lesions. Soft tissues are unremarkable. IMPRESSION: Negative. Electronically Signed   By: Marlaine Hind M.D.   On: 03/13/2019 08:25    Pending Labs Unresulted Labs (From admission, onward)    Start     Ordered   03/20/19 0500  Creatinine, serum  (enoxaparin (LOVENOX)    CrCl >/= 30 ml/min)  Weekly,   R    Comments: while on enoxaparin therapy    03/13/19 1217   03/14/19 [id number]  Basic metabolic panel  Tomorrow morning,   R     03/13/19 1217   03/14/19 0500  CBC  Tomorrow morning,   R     03/13/19 1217   03/13/19 1211  HIV Antibody (routine testing w rflx)  (HIV Antibody (Routine testing w reflex) panel)  Once,   STAT     03/13/19 1217   03/13/19 1211  CBC  (enoxaparin (LOVENOX)    CrCl >/= 30 ml/min)  Once,   STAT    Comments: Baseline for enoxaparin therapy IF NOT ALREADY DRAWN.  Notify MD if PLT < 100 K.    03/13/19 1217   03/13/19 1211  Creatinine, serum  (enoxaparin (LOVENOX)    CrCl >/= 30 ml/min)  Once,   STAT    Comments: Baseline for enoxaparin therapy IF NOT ALREADY DRAWN.    03/13/19 1217   03/13/19 1115  ABO/Rh  Once,   R     03/13/19 1115           Vitals/Pain Today's Vitals   03/13/19 1148 03/13/19 1200 03/13/19 1215 03/13/19 1245  BP:  (!) 109/58  (!) 134/116  Pulse: 99 100  99 98  Resp: 10 13 16  (!) 21  Temp:      TempSrc:      SpO2: 98% 99% 98% 93%  Weight:      Height:      PainSc:    0-No pain    Isolation Precautions No active isolations  Medications Medications  enoxaparin (LOVENOX) injection 40 mg (has no administration in time range)  0.9 %  sodium chloride infusion (has no administration in time range)  ceFAZolin (ANCEF) IVPB 1 g/50 mL premix (has no administration in time range)  acetaminophen (TYLENOL) tablet 650 mg (has no administration in time range)  oxyCODONE (Oxy IR/ROXICODONE) immediate release tablet 5-10 mg (has no administration in time range)  morphine 2 MG/ML injection 1-4 mg (has no administration in time range)  docusate sodium (COLACE) capsule 100 mg (has no administration in time range)  ondansetron (ZOFRAN-ODT) disintegrating tablet 4 mg (has no administration in time range)    Or  ondansetron (ZOFRAN) injection 4 mg (has no administration in time range)  pantoprazole (PROTONIX) EC tablet 40 mg (has no administration in time range)    Or  pantoprazole (PROTONIX) injection 40 mg (has no administration in time range)  metoprolol tartrate (LOPRESSOR) injection 5 mg (has no administration in time range)  alendronate (FOSAMAX) tablet 70 mg (has no administration in time range)  calcium-vitamin D (OSCAL WITH D) 500-200 MG-UNIT per tablet 1 tablet (has no administration in time range)  cyproheptadine (PERIACTIN) 4 MG tablet 4 mg (has no administration in time range)  divalproex (DEPAKOTE ER) 24 hr tablet 1,500 mg (has no administration in time range)  Ferrous Sulfate TBCR 1 tablet (has no administration in time range)  hydrOXYzine (ATARAX/VISTARIL) tablet 50 mg (has no administration in time range)  levothyroxine (SYNTHROID) tablet 50 mcg (has no administration in time range)  loratadine  (CLARITIN) tablet 10 mg (has no administration in time range)  mirtazapine (REMERON) tablet 15 mg (has no administration in time range)  OLANZapine zydis (ZYPREXA) disintegrating tablet 15 mg (has no administration in time range)  Theratrum Complete TABS (has no administration in time range)  traZODone (DESYREL) tablet 300 mg (has no administration in time range)  Tdap (BOOSTRIX) injection 0.5 mL (0.5 mLs Intramuscular Given 03/13/19 1056)  ceFAZolin (ANCEF) IVPB 2g/100 mL premix (0 g Intravenous Stopped 03/13/19 1146)  fentaNYL (SUBLIMAZE) injection 50 mcg (50 mcg Intravenous Given 03/13/19 1052)    Mobility walks High fall risk   Focused Assessments Pulmonary Assessment Handoff:  Lung sounds:   O2 Device: Room Air        R Recommendations: See Admitting Provider Note  Report given to:   Additional Notes: HX of mental retardation and schizophrenia, Lives with caregiver.

## 2019-03-13 NOTE — ED Notes (Signed)
Patient transported to X-ray 

## 2019-03-13 NOTE — Anesthesia Procedure Notes (Signed)
Procedure Name: Intubation Date/Time: 03/13/2019 2:43 PM Performed by: Alain Marion, CRNA Pre-anesthesia Checklist: Patient identified, Emergency Drugs available, Suction available and Patient being monitored Patient Re-evaluated:Patient Re-evaluated prior to induction Oxygen Delivery Method: Circle System Utilized Preoxygenation: Pre-oxygenation with 100% oxygen Induction Type: IV induction Ventilation: Mask ventilation without difficulty Laryngoscope Size: Glidescope and 3 Grade View: Grade I Tube type: Oral Tube size: 6.5 mm Number of attempts: 1 Airway Equipment and Method: Stylet and Video-laryngoscopy Placement Confirmation: ETT inserted through vocal cords under direct vision,  positive ETCO2 and breath sounds checked- equal and bilateral Secured at: 20 cm Tube secured with: Tape Dental Injury: Teeth and Oropharynx as per pre-operative assessment

## 2019-03-13 NOTE — Consult Note (Signed)
Reason for Consult: Nondisplaced fractures of right C5 and right C6 lateral masses Referring Provider: Drue Second, ER PA  Diamond Bowman is an 53 y.o. female.  HPI: Patient reportedly fell this morning, apparently down a flight of stairs of about 8 steps.  Patient's been evaluated in the [facility] ER and found to have 1) an open comminuted fracture of the right radius and ulna and 2) nondisplaced fractures of the right C5 and right C6 lateral masses.  Neurosurgery and orthopedic surgery have been consulted.  Patient is immobilized in [place] cervical collar, the front of which is over her face; an Aspen cervical collar has been ordered.  Patient does not describe any neck pain, and is nodding her head and shaking her head to answer questions without discomfort or limitation.    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  Patient has lived with Marolyn Haller, her therapeutic provider, who is at the bedside.  Her guardian is her mother.  Past Medical History:  Past Medical History:  Diagnosis Date  . Fall   . Heart murmur   . Mental retardation   . Schizophrenia   . Seizures   . Tardive dyskinesia     Past Surgical History: No past surgical history on file.  Family History:  Family History  Problem Relation Age of Onset  . Osteoarthritis Mother   . Diabetes Father     Social History:  reports that she has never smoked. She does not have any smokeless tobacco history on file. She reports that she does not drink alcohol or use drugs.  Allergies: No Known Allergies  Medications: I have reviewed the patient's current medications.  ROS : Unobtainable due to developmental delay.  Physical Examination: Middle-age  female, in no acute distress. Blood pressure 93/73, pulse 99, temperature 98 F (36.7 C), temperature source Oral, resp. rate 13, SpO2 99 %.  Musculoskeletal: Right forearm in dressing.  Neurological Examination: Mental Status Examination: Awake and alert, following commands.  Motor Examination: Moving left upper extremity and bilateral lower extremities well, with full strength.  Moving right upper extremity in a limited fashion due to fractured radius and ulna. Sensory Examination: Senses pinprick throughout. Reflex Examination:   Diminished throughout, right upper extremity not testable. Gait and Stance Examination: Not tested due to the nature the patient's condition.   Results for orders placed or performed during the hospital encounter of 03/13/19 (from the past 48 hour(s))  Basic metabolic panel     Status: Abnormal   Collection Time: 03/13/19  7:01 AM  Result Value Ref Range   Sodium 143 135 - 145 mmol/L   Potassium 4.4 3.5 - 5.1 mmol/L   Chloride 106 98 - 111 mmol/L   CO2 26 22 - 32 mmol/L   Glucose, Bld 108 (H) 70 - 99 mg/dL   BUN 36 (H) 6 - 20 mg/dL   Creatinine, Ser 1.52 (H) 0.44 - 1.00 mg/dL   Calcium 8.5 (L) 8.9 - 10.3 mg/dL   GFR calc non Af Amer 38 (L) >60 mL/min   GFR calc Af Amer 44 (L) >60 mL/min   Anion gap 11 5 - 15    Comment: Performed at [facility], [address], [place], [place] [address]  CBC  with Differential     Status: Abnormal   Collection Time: 03/13/19  7:01 AM  Result Value Ref Range   WBC 10.3 4.0 - 10.5 K/uL   RBC 3.66 (L) 3.87 - 5.11 MIL/uL   Hemoglobin 11.7 (L) 12.0 - 15.0 g/dL   HCT 37.8 36.0 - 46.0 %   MCV 103.3 (H) 80.0 - 100.0 fL   MCH 32.0 26.0 - 34.0 pg   MCHC 31.0 30.0 - 36.0 g/dL   RDW 15.6 (H) 11.5 - 15.5 %   Platelets 82 (L) 150 - 400 K/uL    Comment: REPEATED TO VERIFY PLATELET COUNT CONFIRMED BY SMEAR Immature Platelet Fraction may be clinically indicated, consider ordering this additional test [id number]     nRBC 0.0 0.0 - 0.2 %   Neutrophils Relative % 66 %   Neutro Abs 6.8 1.7 - 7.7 K/uL   Lymphocytes Relative 23 %   Lymphs Abs 2.4 0.7 - 4.0 K/uL   Monocytes Relative 9 %   Monocytes Absolute 0.9 0.1 - 1.0 K/uL   Eosinophils Relative 1 %   Eosinophils Absolute 0.1 0.0 - 0.5 K/uL   Basophils Relative 0 %   Basophils Absolute 0.0 0.0 - 0.1 K/uL   Immature Granulocytes 1 %   Abs Immature Granulocytes 0.09 (H) 0.00 - 0.07 K/uL    Comment: Performed at [facility] [address] [place],  [address]  SARS Coronavirus 2 by RT PCR (hospital order, performed in [facility] hospital lab) Nasopharyngeal Nasopharyngeal Swab     Status: None   Collection Time: 03/13/19  9:00 AM   Specimen: Nasopharyngeal Swab  Result Value Ref Range   SARS Coronavirus 2 NEGATIVE NEGATIVE    Comment: (NOTE) If result is NEGATIVE SARS-CoV-2 target nucleic acids are NOT DETECTED. The SARS-CoV-2 RNA is generally detectable in upper and lower  respiratory specimens during the acute phase of infection. The lowest  concentration of SARS-CoV-2 viral copies this assay can detect is 250  copies / mL. A negative result does not preclude SARS-CoV-2 infection  and should not be used as the sole basis for treatment or other  patient management decisions.  A negative result may occur with  improper specimen collection / handling, submission of specimen other  than nasopharyngeal swab, presence of viral mutation(s) within the  areas targeted by this assay, and inadequate number of viral copies  (<250 copies / mL). A negative result must be combined with clinical  observations, patient history, and epidemiological information. If result is POSITIVE SARS-CoV-2 target nucleic acids are DETECTED. The SARS-CoV-2 RNA is generally detectable in upper and lower  respiratory specimens dur ing the acute phase of infection.  Positive  results are indicative of active infection with SARS-CoV-2.  Clinical  correlation with  patient history and other diagnostic information is  necessary to determine patient infection status.  Positive results do  not rule out bacterial infection or co-infection with other viruses. If result is PRESUMPTIVE POSTIVE SARS-CoV-2 nucleic acids MAY BE PRESENT.   A presumptive positive result was obtained on the submitted specimen  and confirmed on repeat testing.  While 2019 novel coronavirus  (SARS-CoV-2) nucleic acids may be present in the submitted sample  additional confirmatory testing may be necessary for epidemiological  and / or clinical management purposes  to differentiate between  SARS-CoV-2 and other Sarbecovirus currently known to infect humans.  If clinically indicated additional testing with an alternate test  methodology [phone]) is advised. The SARS-CoV-2 RNA is  generally  detectable in upper and lower respiratory sp ecimens during the acute  phase of infection. The expected result is Negative. Fact Sheet for Patients:  StrictlyIdeas.no Fact Sheet for Healthcare Providers: BankingDealers.co.za This test is not yet approved or cleared by the [place] FDA and has been authorized for detection and/or diagnosis of SARS-CoV-2 by FDA under an Emergency Use Authorization (EUA).  This EUA will remain in effect (meaning this test can be used) for the duration of the COVID-19 declaration under Section 564(b)(1) of the Act, 21 U.S.C. section 360bbb-3(b)(1), unless the authorization is terminated or revoked sooner. Performed at [facility], [place] [address], [place],  [address]     Dg Elbow Complete Right  Result Date: 03/13/2019 CLINICAL DATA:  Pain following fall EXAM: RIGHT ELBOW - COMPLETE 3+ VIEW COMPARISON:  None. FINDINGS: Frontal, lateral, and bilateral oblique views were obtained. There is a comminuted fracture through the proximal radial metaphysis with lateral displacement of the distal major  fracture fragment with respect to the major proximal fragment. There is a comminuted fracture of the proximal ulnar diaphysis with anterior and lateral displacement of the distal major fracture fragment with respect proximal fragment. Soft tissue air is noted in these areas suggesting open type fracture. There is no frank dislocation. There are foci of calcification in the joint which likely represent arthropathic changes. IMPRESSION: Comminuted fractures of proximal radius and ulna with displacement of fracture fragments. No gross dislocation. Areas of soft tissue air in the region of the proximal ulnar fracture suggests open type fracture. Intra-articular calcification is likely of arthropathic etiology. Electronically Signed   By: Lowella Grip III M.D.   On: 03/13/2019 08:25   Dg Forearm Right  Result Date: 03/13/2019 CLINICAL DATA:  Pain following fall EXAM: RIGHT FOREARM - 2 VIEW COMPARISON:  None. FINDINGS: Frontal and lateral views obtained. There is a comminuted fracture of the proximal radial metaphysis with lateral displacement and volar angulation of the distal fracture fragment with respect proximal fragment. There is a comminuted fracture of the proximal ulnar diaphysis with volar and medial displacement of the distal major fracture fragment respect major proximal fragment. Soft tissue air in this area suggests open type fracture. No more distal fractures are evident. No dislocation. The joint spaces appear grossly intact. IMPRESSION: Comminuted fractures of the proximal radius and ulna with soft tissue air suggesting open fracture. No dislocation. No appreciable joint space narrowing or erosion. Electronically Signed   By: Lowella Grip III M.D.   On: 03/13/2019 08:26   Ct Head Wo Contrast  Result Date: 03/13/2019 CLINICAL DATA:  Fall down steps. EXAM: CT HEAD WITHOUT CONTRAST CT CERVICAL SPINE WITHOUT CONTRAST TECHNIQUE: Multidetector CT imaging of the head and cervical spine was  performed following the standard protocol without intravenous contrast. Multiplanar CT image reconstructions of the cervical spine were also generated. COMPARISON:  01/04/2015 FINDINGS: CT HEAD FINDINGS Brain: No acute intracranial abnormality. Specifically, no hemorrhage, hydrocephalus, mass lesion, acute infarction, or significant intracranial injury. Vascular: No hyperdense vessel or unexpected calcification. Skull: No acute calvarial abnormality. Sinuses/Orbits: Short air-fluid levels and mild mucosal thickening in the maxillary sinuses. Mastoid air cells clear. Other: None CT CERVICAL SPINE FINDINGS Alignment: No subluxation Skull base and vertebrae: Fractures through the right lateral C5 and C6 masses. No vertebral body involvement. Soft tissues and spinal canal: No prevertebral fluid or swelling. No visible canal hematoma. 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 Large flowing osteophytes anteriorly. Mild degenerative facet disease. Upper chest: No  acute findings Other: None IMPRESSION: No acute intracranial abnormality. Nondisplaced fractures through the right C5 and C6 lateral masses. These results were called by telephone at the time of interpretation on 03/13/2019 at 8:41 am to provider [facility] , who verbally acknowledged these results. Electronically Signed   By: Rolm Baptise M.D.   On: 03/13/2019 08:42   Ct Cervical Spine Wo Contrast  Result Date: 03/13/2019 CLINICAL DATA:  Fall down steps. EXAM: CT HEAD WITHOUT CONTRAST CT CERVICAL SPINE WITHOUT CONTRAST TECHNIQUE: Multidetector CT imaging of the head and cervical spine was performed following the standard protocol without intravenous contrast. Multiplanar CT image reconstructions of the cervical spine were also generated. COMPARISON:  01/04/2015 FINDINGS: CT HEAD FINDINGS Brain: No acute intracranial abnormality. Specifically, no hemorrhage, hydrocephalus, mass lesion, acute infarction, or significant intracranial injury. Vascular: No  hyperdense vessel or unexpected calcification. Skull: No acute calvarial abnormality. Sinuses/Orbits: Short air-fluid levels and mild mucosal thickening in the maxillary sinuses. Mastoid air cells clear. Other: None CT CERVICAL SPINE FINDINGS Alignment: No subluxation Skull base and vertebrae: Fractures through the right lateral C5 and C6 masses. No vertebral body involvement. Soft tissues and spinal canal: No prevertebral fluid or swelling. No visible canal hematoma. 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 Large flowing osteophytes anteriorly. Mild degenerative facet disease. Upper chest: No acute findings Other: None IMPRESSION: No acute intracranial abnormality. Nondisplaced fractures through the right C5 and C6 lateral masses. These results were called by telephone at the time of interpretation on 03/13/2019 at 8:41 am to provider [facility] , who verbally acknowledged these results. Electronically Signed   By: Rolm Baptise M.D.   On: 03/13/2019 08:42   Dg Humerus Right  Result Date: 03/13/2019 CLINICAL DATA:  Fall down stairs today. Right arm pain. Initial encounter. EXAM: RIGHT HUMERUS - 2+ VIEW COMPARISON:  None. FINDINGS: There is no evidence of fracture or other focal bone lesions. Soft tissues are unremarkable. IMPRESSION: Negative. Electronically Signed   By: Marlaine Hind M.D.   On: 03/13/2019 08:25     Assessment/Plan: Patient with multiple trauma following a fall earlier today.  She has a open comminuted right radius and ulnar fracture.  She has apparent fractures of the right C5 and right C6 lateral masses.  However with the intact flowing osteophyte from C5-C7, I suspect her cervical spine is stable.  She is moving the left upper extremity and both lower extremities well, and does have movement in the right upper extremity, although it is limited due to the fractures in the right forearm.  Notably as I examined her she nods and shakes her head when answering questions without  discomfort or limitation.  She has been ordered in [place] cervical collar.  Ultimately if she is able to cooperate, cervical spine flexion and extension views would be helpful.  If the cervical spine is shown to be stable she could then be transition to a soft cervical collar, that could be used if needed for comfort.  Hosie Spangle, MD 03/13/2019, 10:44 AM

## 2019-03-13 NOTE — ED Provider Notes (Signed)
[facility] Provider Note   CSN: [id number] Arrival date & time: 03/13/19  [id number]     History   Chief Complaint Chief Complaint  Patient presents with   Fall   LEVEL 5 CAVEAT - INTELLECTUAL DISABILITY  HPI Diamond Bowman is a 53 y.o. female with PMHx MR, schizophrenia, seizures, tardive dyskinesia who presents to the ED s/p fall that occurred PTA. Per caregiver; she heard a loud crash and then found pt at the floor of the staircase. She reports that pt typically will come down the stairs and turn the lights on to see; the light was not on this morning. She assumes pt must have fallen down approximately 8 steps. She does not believe she lost consciousness as she came the second she heard the loud crash and pt was conscious. Per triage report; EMS noted compound fracture to right arm. Pt is not anticoagulated. She is able to move the arm without pain although according to caregiver patient will not complain of pain even if her pain is severe. She reports pt is acting at baseline for her. Caregiver is unsure when pt's last tetanus shot was.      The history is provided by the patient and a caregiver. The history is limited by a developmental delay.    Past Medical History:  Diagnosis Date   Fall    Heart murmur    Mental retardation    Schizophrenia    Seizures    Tardive dyskinesia     Patient Active Problem List   Diagnosis Date Noted   Thrombocytopenia ([place])    Encephalopathy, metabolic [id number]   Chronic kidney disease, stage 3 01/04/2015   Encephalopathy 01/04/2015   Schizophrenia ([place]) 12/28/2014   Mental retardation 05/14/2012   Seizure ([place]) 05/14/2012   Bipolar 2 disorder ([place]) 05/14/2012   Fall 05/14/2012   Hypothyroid 05/14/2012   Osteoporosis 05/14/2012    No past surgical history on file.   OB History   No obstetric history on file.      Home Medications    Prior to Admission medications     Medication Sig Start Date End Date Taking? Authorizing Provider  alendronate (FOSAMAX) 70 MG tablet Take 70 mg by mouth every 7 (seven) days. Take with a full glass of water on an empty stomach every Thursday.    [provider]  Calcium Carbonate-Vitamin D (OSCAL 500/200 D-3 PO) Take 1 tablet by mouth daily.    [provider]  cyproheptadine (PERIACTIN) 4 MG tablet Take 4 mg by mouth at bedtime.    [provider]  divalproex (DEPAKOTE ER) 500 MG 24 hr tablet Take 3 tablets (1,500 mg total) by mouth at bedtime. 05/15/12   Nita Sells, MD  docusate sodium (COLACE) 100 MG capsule Take 100 mg by mouth daily.    [provider]  Emollient (EUCERIN) lotion Apply topically 2 (two) times daily.    [provider]  Ferrous Sulfate 143 (45 FE) MG TBCR Take 1 tablet by mouth daily.    [provider]  hydrOXYzine (ATARAX/VISTARIL) 50 MG tablet Take 50 mg by mouth 2 (two) times daily.    [provider]  levothyroxine (SYNTHROID, LEVOTHROID) 50 MCG tablet Take 50 mcg by mouth daily.    [provider]  loratadine (LORADAMED) 10 MG tablet Take 10 mg by mouth daily.    [provider]  mirtazapine (REMERON) 15 MG tablet Take 15 mg by mouth at bedtime.  [provider]  Multiple Vitamins-Minerals (THERATRUM COMPLETE PO) Take 1 tablet by mouth daily.    [provider]  olanzapine zydis (ZYPREXA) 15 MG disintegrating tablet Take 1 tablet (15 mg total) by mouth at bedtime. 01/04/15   Debbe Odea, MD  traZODone (DESYREL) 100 MG tablet Take 300 mg by mouth at bedtime.     [provider]    Family History Family History  Problem Relation Age of Onset   Osteoarthritis Mother    Diabetes Father     Social History Social History   Tobacco Use   Smoking status: Never Smoker  Substance Use Topics   Alcohol use: No   Drug use: No     Allergies   Patient has no known  allergies.   Review of Systems Review of Systems  Unable to perform ROS: Other     Physical Exam Updated Vital Signs BP 116/70    Pulse 81    Temp 98 F (36.7 C) (Oral)    Resp 16    SpO2 100%   Physical Exam Vitals signs and nursing note reviewed.  Constitutional:      Appearance: She is not ill-appearing.  HENT:     Head: Normocephalic and atraumatic.     Comments: No raccoon's sign or battle's sign. Negative hemotympanum bilaterally.     Right Ear: Tympanic membrane normal.     Left Ear: Tympanic membrane normal.  Eyes:     Conjunctiva/sclera: Conjunctivae normal.  Neck:     Musculoskeletal: Normal range of motion and neck supple.  Cardiovascular:     Rate and Rhythm: Normal rate and regular rhythm.     Pulses: Normal pulses.  Pulmonary:     Effort: Pulmonary effort is normal.     Breath sounds: Normal breath sounds. No wheezing, rhonchi or rales.  Abdominal:     Palpations: Abdomen is soft.     Tenderness: There is no abdominal tenderness. There is no guarding or rebound.  Musculoskeletal:     Comments: Right arm in sling. Sling removed; obvious deformity noted to right proximal forearm with active bleeding; TTP. No tenderness noted to shoulder or wrist. Pt is complaining of pain to her right humerus as well. 2+ radial pulse. Able to wiggle fingers. Able to give a thumbs up. Grip strength 4/5 compared to right.   Skin:    General: Skin is warm and dry.  Neurological:     Mental Status: She is alert. Mental status is at baseline.      ED Treatments / Results  Labs (all labs ordered are listed, but only abnormal results are displayed) Labs Reviewed  BASIC METABOLIC PANEL - Abnormal; Notable for the following components:      Result Value   Glucose, Bld 108 (*)    BUN 36 (*)    Creatinine, Ser 1.52 (*)    Calcium 8.5 (*)    GFR calc non Af Amer 38 (*)    GFR calc Af Amer 44 (*)    All other components within normal limits  CBC WITH DIFFERENTIAL/PLATELET -  Abnormal; Notable for the following components:   RBC 3.66 (*)    Hemoglobin 11.7 (*)    MCV 103.3 (*)    RDW 15.6 (*)    Platelets 82 (*)    Abs Immature Granulocytes 0.09 (*)    All other components within normal limits  SARS CORONAVIRUS 2 BY RT PCR (HOSPITAL ORDER, [facility])  TYPE AND  SCREEN    EKG None  Radiology Dg Elbow Complete Right  Result Date: 03/13/2019 CLINICAL DATA:  Pain following fall EXAM: RIGHT ELBOW - COMPLETE 3+ VIEW COMPARISON:  None. FINDINGS: Frontal, lateral, and bilateral oblique views were obtained. There is a comminuted fracture through the proximal radial metaphysis with lateral displacement of the distal major fracture fragment with respect to the major proximal fragment. There is a comminuted fracture of the proximal ulnar diaphysis with anterior and lateral displacement of the distal major fracture fragment with respect proximal fragment. Soft tissue air is noted in these areas suggesting open type fracture. There is no frank dislocation. There are foci of calcification in the joint which likely represent arthropathic changes. IMPRESSION: Comminuted fractures of proximal radius and ulna with displacement of fracture fragments. No gross dislocation. Areas of soft tissue air in the region of the proximal ulnar fracture suggests open type fracture. Intra-articular calcification is likely of arthropathic etiology. Electronically Signed   By: Lowella Grip III M.D.   On: 03/13/2019 08:25   Dg Forearm Right  Result Date: 03/13/2019 CLINICAL DATA:  Pain following fall EXAM: RIGHT FOREARM - 2 VIEW COMPARISON:  None. FINDINGS: Frontal and lateral views obtained. There is a comminuted fracture of the proximal radial metaphysis with lateral displacement and volar angulation of the distal fracture fragment with respect proximal fragment. There is a comminuted fracture of the proximal ulnar diaphysis with volar and medial displacement of the  distal major fracture fragment respect major proximal fragment. Soft tissue air in this area suggests open type fracture. No more distal fractures are evident. No dislocation. The joint spaces appear grossly intact. IMPRESSION: Comminuted fractures of the proximal radius and ulna with soft tissue air suggesting open fracture. No dislocation. No appreciable joint space narrowing or erosion. Electronically Signed   By: Lowella Grip III M.D.   On: 03/13/2019 08:26   Ct Head Wo Contrast  Result Date: 03/13/2019 CLINICAL DATA:  Fall down steps. EXAM: CT HEAD WITHOUT CONTRAST CT CERVICAL SPINE WITHOUT CONTRAST TECHNIQUE: Multidetector CT imaging of the head and cervical spine was performed following the standard protocol without intravenous contrast. Multiplanar CT image reconstructions of the cervical spine were also generated. COMPARISON:  01/04/2015 FINDINGS: CT HEAD FINDINGS Brain: No acute intracranial abnormality. Specifically, no hemorrhage, hydrocephalus, mass lesion, acute infarction, or significant intracranial injury. Vascular: No hyperdense vessel or unexpected calcification. Skull: No acute calvarial abnormality. Sinuses/Orbits: Short air-fluid levels and mild mucosal thickening in the maxillary sinuses. Mastoid air cells clear. Other: None CT CERVICAL SPINE FINDINGS Alignment: No subluxation Skull base and vertebrae: Fractures through the right lateral C5 and C6 masses. No vertebral body involvement. Soft tissues and spinal canal: No prevertebral fluid or swelling. No visible canal hematoma. 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 Large flowing osteophytes anteriorly. Mild degenerative facet disease. Upper chest: No acute findings Other: None IMPRESSION: No acute intracranial abnormality. Nondisplaced fractures through the right C5 and C6 lateral masses. These results were called by telephone at the time of interpretation on 03/13/2019 at 8:41 am to provider [facility] Majel Giel , who verbally  acknowledged these results. Electronically Signed   By: Rolm Baptise M.D.   On: 03/13/2019 08:42   Ct Cervical Spine Wo Contrast  Result Date: 03/13/2019 CLINICAL DATA:  Fall down steps. EXAM: CT HEAD WITHOUT CONTRAST CT CERVICAL SPINE WITHOUT CONTRAST TECHNIQUE: Multidetector CT imaging of the head and cervical spine was performed following the standard protocol without intravenous contrast. Multiplanar CT image reconstructions of the cervical  spine were also generated. COMPARISON:  01/04/2015 FINDINGS: CT HEAD FINDINGS Brain: No acute intracranial abnormality. Specifically, no hemorrhage, hydrocephalus, mass lesion, acute infarction, or significant intracranial injury. Vascular: No hyperdense vessel or unexpected calcification. Skull: No acute calvarial abnormality. Sinuses/Orbits: Short air-fluid levels and mild mucosal thickening in the maxillary sinuses. Mastoid air cells clear. Other: None CT CERVICAL SPINE FINDINGS Alignment: No subluxation Skull base and vertebrae: Fractures through the right lateral C5 and C6 masses. No vertebral body involvement. Soft tissues and spinal canal: No prevertebral fluid or swelling. No visible canal hematoma. 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 Large flowing osteophytes anteriorly. Mild degenerative facet disease. Upper chest: No acute findings Other: None IMPRESSION: No acute intracranial abnormality. Nondisplaced fractures through the right C5 and C6 lateral masses. These results were called by telephone at the time of interpretation on 03/13/2019 at 8:41 am to provider [facility] Jaycelyn Orrison , who verbally acknowledged these results. Electronically Signed   By: Rolm Baptise M.D.   On: 03/13/2019 08:42   Dg Humerus Right  Result Date: 03/13/2019 CLINICAL DATA:  Fall down stairs today. Right arm pain. Initial encounter. EXAM: RIGHT HUMERUS - 2+ VIEW COMPARISON:  None. FINDINGS: There is no evidence of fracture or other focal bone lesions. Soft tissues are unremarkable.  IMPRESSION: Negative. Electronically Signed   By: Marlaine Hind M.D.   On: 03/13/2019 08:25    Procedures Procedures (including critical care time)  Medications Ordered in ED Medications  Tdap (BOOSTRIX) injection 0.5 mL (0.5 mLs Intramuscular Given 03/13/19 1056)  ceFAZolin (ANCEF) IVPB 2g/100 mL premix (2 g Intravenous New Bag/Given 03/13/19 1056)  fentaNYL (SUBLIMAZE) injection 50 mcg (50 mcg Intravenous Given 03/13/19 1052)     Initial Impression / Assessment and Plan / ED Course  I have reviewed the triage vital signs and the nursing notes.  Pertinent labs & imaging results that were available during my care of the patient were reviewed by me and considered in my medical decision making (see chart for details).  Clinical Course as of Mar 12 1129  Mon Mar 13, 2019  0936 Discussed case with Dr. Sherwood Gambler with neurosurg; he will come evaluate patient but believes she can follow up PRN and be discharged with soft collar.    [MV]    Clinical Course User Index [MV] Eustaquio Maize, PA-C   53 year old female with history of intellectual disability presents to the ED with complaint of fall down approximately 8 steps this AM.  Fall was not witnessed but caregiver came to patient's side immediately after hearing the crash.  Given patient has intellectual disability she does not typically complain of any pain.  Per triage report there is suspected open fracture with deformity to the right forearm and some mild bleeding.  On exam patient does have 2 sure wounds to the proximal aspect of the right forearm with deformity noted.  Will obtain x-rays at this time.  Patient does appear to be acting at baseline per caregiver and she is not anticoagulated but given she fell and likely hit her head will obtain CT head and CT C-spine at this time.  Patient has no other complaints.  No other bony deformities or step-offs noted.  We will start Ancef given concern for open fracture.  Tetanus will be updated in  the ED as caregiver is unsure of tetanus status.   Recieved call from radiologist with findings of fracture of the lateral mass of C5 and C6.  Patient placed in c-collar.  Will consult neurosurgery.  Will need to come into the hospital.  Will swab for Covid.  X-ray of right forearm with comminuted displaced fractures of proximal radius and ulna.  Will consult orthopedic surgery at this time.  Neurosurgery and ortho surgery have evaluated patient; plan for ORIF this afternoon with Dr. Jeannie Fend for radial/ulnar fracture. Awaiting reccs from neurosurg but Dr. Sherwood Gambler believe's pt will not require surgery. Hilbert Odor, PA-C reccs either trauma or medicine admission at this time. Will consult trauma given 2 body system fractures.   Discussed case with Dr. Bobbye Morton with trauma; agrees to accept patient for admission at this time.   This note was prepared using Dragon voice recognition software and may include unintentional dictation errors due to the inherent limitations of voice recognition software.       Final Clinical Impressions(s) / ED Diagnoses   Final diagnoses:  Fall, initial encounter  Other closed nondisplaced fracture of fifth cervical vertebra, initial encounter ([place])  Other closed nondisplaced fracture of sixth cervical vertebra, initial encounter ([place])  Type I or II open comminuted fracture of proximal end of right radius, initial encounter  Open comminuted fracture of proximal ulna, right, type I or II, initial encounter  Intellectual disability    ED Discharge Orders    None       Eustaquio Maize, PA-C 03/13/19 1649    Dorie Rank, MD 03/14/19 [phone]

## 2019-03-13 NOTE — Progress Notes (Signed)
Pt arrived 6N12, alert, oriented. No complaints of pain. Oriented to room, use of call bell. Will endorsed to night nurse

## 2019-03-13 NOTE — ED Notes (Signed)
c-colar repositioned with PA Claiborne Billings

## 2019-03-13 NOTE — H&P (Signed)
[facility] Admission Note  [place] 09-Mar-1966  SN:[id number].    Requesting MD: Tomi Bamberger Chief Complaint/Reason for Consult: Fall down stairs  HPI:  Patient is a 53 year old female who presented to [facility] after a fall this AM. Patient lives in her caregiver's home who reports hearing a large crash this AM and then finding patient at the bottom of the stairs. Patient usually turns on the light and goes down the steps in the AM, but this AM the lights were not on. Caregiver thought possibly she couldn't see well and lost her footing. Fell down about 8 steps. No LOC. EMS noted obvious deformity to RUE. Per caregiver patient is mostly at baseline. Complains of pain in right arm and back pain. PMH significant for mental retardation, schizophrenia, seizures and tardive dyskinesia. Patient also has a hx of heart murmur. Patient has lived with Caregiver, [place], for 50 years but her mother is her POA. Patient generally goes to a day program while caregiver is at work. No alcohol, tobacco, or illicit drug use. Patient does not take any blood thinning medications. NKDA.   ROS: Review of Systems  Reason unable to perform ROS: ROS provided by patient and caregiver.  Constitutional: Negative for chills and fever.  Respiratory: Negative for shortness of breath.   Cardiovascular: Negative for chest pain.  Gastrointestinal: Negative for abdominal pain, nausea and vomiting.  Musculoskeletal: Positive for back pain, joint pain and neck pain.  Neurological: Negative for speech change, loss of consciousness and headaches.  All other systems reviewed and are negative.   Family History  Problem Relation Age of Onset  . Osteoarthritis Mother   . Diabetes Father     Past Medical History:  Diagnosis Date  . Fall   . Heart murmur   . Mental retardation   . Schizophrenia   . Seizures   . Tardive dyskinesia     No past surgical history on file.  Social History:  reports that she has never  smoked. She does not have any smokeless tobacco history on file. She reports that she does not drink alcohol or use drugs.  Allergies: No Known Allergies  (Not in a hospital admission)   Blood pressure 98/80, pulse 99, temperature 98 F (36.7 C), temperature source Oral, resp. rate 10, height 4\' 9"  (1.448 m), weight 64.4 kg, SpO2 98 %. Physical Exam: Physical Exam Constitutional:      General: She is not in acute distress.    Appearance: She is well-developed and overweight. She is not toxic-appearing.     Interventions: Cervical collar in place.  HENT:     Head: Normocephalic and atraumatic.     Right Ear: External ear normal.     Left Ear: External ear normal.     Nose: Nose normal.  Eyes:     General: Lids are normal. No scleral icterus.    Extraocular Movements: Extraocular movements intact.     Conjunctiva/sclera: Conjunctivae normal.     Pupils: Pupils are equal, round, and reactive to light.  Neck:     Musculoskeletal: Neck supple.     Comments: Collar present Cardiovascular:     Rate and Rhythm: Normal rate and regular rhythm.     Pulses:          Radial pulses are 2+ on the right side and 2+ on the left side.       Dorsalis pedis pulses are 2+ on the right side and 2+ on the left side.  Pulmonary:     Effort: Pulmonary effort is normal.     Breath sounds: Normal breath sounds.  Chest:     Chest wall: No lacerations, deformity or crepitus.  Abdominal:     General: Bowel sounds are normal. There is no distension.     Palpations: Abdomen is soft. There is no hepatomegaly or splenomegaly.     Tenderness: There is no abdominal tenderness. There is no guarding or rebound.  Musculoskeletal:     Right lower leg: No edema.     Left lower leg: No edema.     Comments: Obvious deformity to R forearm with wounds posteriorly, sensation intact, patient able to lightly grip R hand but ROM limited by pain; No other obvious deformities to other extremities; No spinal step-offs   Skin:    General: Skin is warm and dry.     Findings: No rash.  Neurological:     Mental Status: She is alert. Mental status is at baseline.  Psychiatric:        Behavior: Behavior is cooperative.     Comments: At baseline per patient caregiver     Results for orders placed or performed during the hospital encounter of 03/13/19 (from the past 48 hour(s))  Basic metabolic panel     Status: Abnormal   Collection Time: 03/13/19  7:01 AM  Result Value Ref Range   Sodium 143 135 - 145 mmol/L   Potassium 4.4 3.5 - 5.1 mmol/L   Chloride 106 98 - 111 mmol/L   CO2 26 22 - 32 mmol/L   Glucose, Bld 108 (H) 70 - 99 mg/dL   BUN 36 (H) 6 - 20 mg/dL   Creatinine, Ser 1.52 (H) 0.44 - 1.00 mg/dL   Calcium 8.5 (L) 8.9 - 10.3 mg/dL   GFR calc non Af Amer 38 (L) >60 mL/min   GFR calc Af Amer 44 (L) >60 mL/min   Anion gap 11 5 - 15    Comment: Performed at [facility], [address] [place], [place] [address]  CBC with Differential     Status: Abnormal   Collection Time: 03/13/19  7:01 AM  Result Value Ref Range   WBC 10.3 4.0 - 10.5 K/uL   RBC 3.66 (L) 3.87 - 5.11 MIL/uL   Hemoglobin 11.7 (L) 12.0 - 15.0 g/dL   HCT 37.8 36.0 - 46.0 %   MCV 103.3 (H) 80.0 - 100.0 fL   MCH 32.0 26.0 - 34.0 pg   MCHC 31.0 30.0 - 36.0 g/dL   RDW 15.6 (H) 11.5 - 15.5 %   Platelets 82 (L) 150 - 400 K/uL    Comment: REPEATED TO VERIFY PLATELET COUNT CONFIRMED BY SMEAR Immature Platelet Fraction may be clinically indicated, consider ordering this additional test [id number]    nRBC 0.0 0.0 - 0.2 %   Neutrophils Relative % 66 %   Neutro Abs 6.8 1.7 - 7.7 K/uL   Lymphocytes Relative 23 %   Lymphs Abs 2.4 0.7 - 4.0 K/uL   Monocytes Relative 9 %   Monocytes Absolute 0.9 0.1 - 1.0 K/uL   Eosinophils Relative 1 %   Eosinophils Absolute 0.1 0.0 - 0.5 K/uL   Basophils Relative 0 %   Basophils Absolute 0.0 0.0 - 0.1 K/uL   Immature Granulocytes 1 %   Abs Immature Granulocytes 0.09 (H) 0.00 - 0.07 K/uL     Comment: Performed at [facility], [place] [address] [place], [place] [address]  SARS Coronavirus  2 by RT PCR (hospital order, performed in [facility] hospital lab) Nasopharyngeal Nasopharyngeal Swab     Status: None   Collection Time: 03/13/19  9:00 AM   Specimen: Nasopharyngeal Swab  Result Value Ref Range   SARS Coronavirus 2 NEGATIVE NEGATIVE    Comment: (NOTE) If result is NEGATIVE SARS-CoV-2 target nucleic acids are NOT DETECTED. The SARS-CoV-2 RNA is generally detectable in upper and lower  respiratory specimens during the acute phase of infection. The lowest  concentration of SARS-CoV-2 viral copies this assay can detect is 250  copies / mL. A negative result does not preclude SARS-CoV-2 infection  and should not be used as the sole basis for treatment or other  patient management decisions.  A negative result may occur with  improper specimen collection / handling, submission of specimen other  than nasopharyngeal swab, presence of viral mutation(s) within the  areas targeted by this assay, and inadequate number of viral copies  (<250 copies / mL). A negative result must be combined with clinical  observations, patient history, and epidemiological information. If result is POSITIVE SARS-CoV-2 target nucleic acids are DETECTED. The SARS-CoV-2 RNA is generally detectable in upper and lower  respiratory specimens dur ing the acute phase of infection.  Positive  results are indicative of active infection with SARS-CoV-2.  Clinical  correlation with patient history and other diagnostic information is  necessary to determine patient infection status.  Positive results do  not rule out bacterial infection or co-infection with other viruses. If result is PRESUMPTIVE POSTIVE SARS-CoV-2 nucleic acids MAY BE PRESENT.   A presumptive positive result was obtained on the submitted specimen  and confirmed on repeat testing.  While 2019 novel coronavirus  (SARS-CoV-2) nucleic acids  may be present in the submitted sample  additional confirmatory testing may be necessary for epidemiological  and / or clinical management purposes  to differentiate between  SARS-CoV-2 and other Sarbecovirus currently known to infect humans.  If clinically indicated additional testing with an alternate test  methodology [phone]) is advised. The SARS-CoV-2 RNA is generally  detectable in upper and lower respiratory sp ecimens during the acute  phase of infection. The expected result is Negative. Fact Sheet for Patients:  StrictlyIdeas.no Fact Sheet for Healthcare Providers: BankingDealers.co.za This test is not yet approved or cleared by the [place] FDA and has been authorized for detection and/or diagnosis of SARS-CoV-2 by FDA under an Emergency Use Authorization (EUA).  This EUA will remain in effect (meaning this test can be used) for the duration of the COVID-19 declaration under Section 564(b)(1) of the Act, 21 U.S.C. section 360bbb-3(b)(1), unless the authorization is terminated or revoked sooner. Performed at [facility], [place] [address], [place], [place] [address]    Dg Elbow Complete Right  Result Date: 03/13/2019 CLINICAL DATA:  Pain following fall EXAM: RIGHT ELBOW - COMPLETE 3+ VIEW COMPARISON:  None. FINDINGS: Frontal, lateral, and bilateral oblique views were obtained. There is a comminuted fracture through the proximal radial metaphysis with lateral displacement of the distal major fracture fragment with respect to the major proximal fragment. There is a comminuted fracture of the proximal ulnar diaphysis with anterior and lateral displacement of the distal major fracture fragment with respect proximal fragment. Soft tissue air is noted in these areas suggesting open type fracture. There is no frank dislocation. There are foci of calcification in the joint which likely represent arthropathic changes. IMPRESSION:  Comminuted fractures of proximal radius and ulna with displacement of fracture  fragments. No gross dislocation. Areas of soft tissue air in the region of the proximal ulnar fracture suggests open type fracture. Intra-articular calcification is likely of arthropathic etiology. Electronically Signed   By: Lowella Grip III M.D.   On: 03/13/2019 08:25   Dg Forearm Right  Result Date: 03/13/2019 CLINICAL DATA:  Pain following fall EXAM: RIGHT FOREARM - 2 VIEW COMPARISON:  None. FINDINGS: Frontal and lateral views obtained. There is a comminuted fracture of the proximal radial metaphysis with lateral displacement and volar angulation of the distal fracture fragment with respect proximal fragment. There is a comminuted fracture of the proximal ulnar diaphysis with volar and medial displacement of the distal major fracture fragment respect major proximal fragment. Soft tissue air in this area suggests open type fracture. No more distal fractures are evident. No dislocation. The joint spaces appear grossly intact. IMPRESSION: Comminuted fractures of the proximal radius and ulna with soft tissue air suggesting open fracture. No dislocation. No appreciable joint space narrowing or erosion. Electronically Signed   By: Lowella Grip III M.D.   On: 03/13/2019 08:26   Ct Head Wo Contrast  Result Date: 03/13/2019 CLINICAL DATA:  Fall down steps. EXAM: CT HEAD WITHOUT CONTRAST CT CERVICAL SPINE WITHOUT CONTRAST TECHNIQUE: Multidetector CT imaging of the head and cervical spine was performed following the standard protocol without intravenous contrast. Multiplanar CT image reconstructions of the cervical spine were also generated. COMPARISON:  01/04/2015 FINDINGS: CT HEAD FINDINGS Brain: No acute intracranial abnormality. Specifically, no hemorrhage, hydrocephalus, mass lesion, acute infarction, or significant intracranial injury. Vascular: No hyperdense vessel or unexpected calcification. Skull: No acute calvarial  abnormality. Sinuses/Orbits: Short air-fluid levels and mild mucosal thickening in the maxillary sinuses. Mastoid air cells clear. Other: None CT CERVICAL SPINE FINDINGS Alignment: No subluxation Skull base and vertebrae: Fractures through the right lateral C5 and C6 masses. No vertebral body involvement. Soft tissues and spinal canal: No prevertebral fluid or swelling. No visible canal hematoma. 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 Large flowing osteophytes anteriorly. Mild degenerative facet disease. Upper chest: No acute findings Other: None IMPRESSION: No acute intracranial abnormality. Nondisplaced fractures through the right C5 and C6 lateral masses. These results were called by telephone at the time of interpretation on 03/13/2019 at 8:41 am to provider [facility] , who verbally acknowledged these results. Electronically Signed   By: Rolm Baptise M.D.   On: 03/13/2019 08:42   Ct Cervical Spine Wo Contrast  Result Date: 03/13/2019 CLINICAL DATA:  Fall down steps. EXAM: CT HEAD WITHOUT CONTRAST CT CERVICAL SPINE WITHOUT CONTRAST TECHNIQUE: Multidetector CT imaging of the head and cervical spine was performed following the standard protocol without intravenous contrast. Multiplanar CT image reconstructions of the cervical spine were also generated. COMPARISON:  01/04/2015 FINDINGS: CT HEAD FINDINGS Brain: No acute intracranial abnormality. Specifically, no hemorrhage, hydrocephalus, mass lesion, acute infarction, or significant intracranial injury. Vascular: No hyperdense vessel or unexpected calcification. Skull: No acute calvarial abnormality. Sinuses/Orbits: Short air-fluid levels and mild mucosal thickening in the maxillary sinuses. Mastoid air cells clear. Other: None CT CERVICAL SPINE FINDINGS Alignment: No subluxation Skull base and vertebrae: Fractures through the right lateral C5 and C6 masses. No vertebral body involvement. Soft tissues and spinal canal: No prevertebral fluid or swelling.  No visible canal hematoma. 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 Large flowing osteophytes anteriorly. Mild degenerative facet disease. Upper chest: No acute findings Other: None IMPRESSION: No acute intracranial abnormality. Nondisplaced fractures through the right C5 and C6 lateral masses. These results  were called by telephone at the time of interpretation on 03/13/2019 at 8:41 am to provider [facility] , who verbally acknowledged these results. Electronically Signed   By: Rolm Baptise M.D.   On: 03/13/2019 08:42   Dg Humerus Right  Result Date: 03/13/2019 CLINICAL DATA:  Fall down stairs today. Right arm pain. Initial encounter. EXAM: RIGHT HUMERUS - 2+ VIEW COMPARISON:  None. FINDINGS: There is no evidence of fracture or other focal bone lesions. Soft tissues are unremarkable. IMPRESSION: Negative. Electronically Signed   By: Marlaine Hind M.D.   On: 03/13/2019 08:25      Assessment/Plan Fall down stairs Open right proximal ulna and radius fractures - per ortho, planning washout and ORIF later today Non-displaced fractures through R C5 and C6 lateral masses - per NS Back pain - follow up CT chest/abd/pel Hx of seizures - continue antiepileptics Mental delay/Schizophrenia - continue home psych meds Hx of Tardive dyskinesia  FEN: NPO, IVF VTE: SCDs, lovenox tomorrow if stable ID: [id number] for open fx  Plan for OR with ortho later today. Will follow NS recs regarding neck fxs. CT chest/abd/pel to rule out other injuries. PT/OT post-op.   Brigid Re, [facility] 03/13/2019, 12:19 PM Please see Amion for pager number during day hours 7:00am-4:30pm

## 2019-03-13 NOTE — Op Note (Signed)
PREOPERATIVE DIAGNOSIS: Type I open right Monteggia fracture of the ulna and displaced radial neck fracture  POSTOPERATIVE DIAGNOSIS: Same  ATTENDING PHYSICIAN: Maudry Mayhew. Jeannie Fend, III, MD who was present and scrubbed for the entire case   ASSISTANT SURGEON: None.   ANESTHESIA: General  SURGICAL PROCEDURES:  1.  Irrigation and debridement of open fracture including skin, subcutaneous tissue and bone of the open right ulna fracture 2.  Open reduction and internal fixation of open right ulnar shaft fracture 3.  Right radial head replacement  SURGICAL INDICATIONS: Patient is a 53 year old female who presented to the ER earlier today.  She has a history of mental retardation, schizophrenia, seizures and tardive dyskinesia who had a fall down several stairs this morning.  She landed onto the right arm and was seen to have obvious deformity of the arm at the elbow.  Radiographs were obtained in the ER which found displaced, open Monteggia fracture with angulation through the ulna as well as displaced radial neck fracture.  After discussing treatment options with the patient's caregiver as well as her mother they did agree to proceed forward with irrigation debridement of her open fracture as well as operative fixation.  FINDING: There was fractures of both the ulnar shaft as well as the radial neck which were severely displaced.  The ulna was reduced to near anatomic alignment and fixed in place with a straight Acumed 8 hole plate.  The proximal radius was comminuted and severely brittle.  A successful radial head was replaced utilizing a long stemmed Acumed prosthesis.  DESCRIPTION OF PROCEDURE: The patient was identified in the preoperative holding area where the risk benefits and alternatives of the procedure were discussed with the patient's mother.  Phone consent was obtained.  These risks include but are not limited to infection, bleeding, damage to surrounding structures including blood vessels  and nerves, pain, stiffness, implant failure, malunion, nonunion and need for additional procedures.  The patient's right arm was marked with a surgical marking pen.  She was then brought back to the operative suite where timeout was performed identifying the correct patient operative site.  She was positioned supine on the operative table with her hand outstretched on a hand table.  She was induced under general anesthesia.  Tourniquet was placed on the upper arm and the arm was then prepped and draped in usual sterile fashion.  We began with fixation of the ulnar shaft fracture.  A direct approach to the ulnar shaft was utilized with a posterior skin incision.  Dissection was carried down through the subcutaneous tissues using Bovie electrocautery.  Palmar and dorsal flaps were elevated and the flexors and extensors were released off of the ulnar shaft.  There was a small puncture wound along the dorsal forearm just distal to the main fracture segment.  This did communicate with a hematoma thus creating a type I open fracture.  The skin, subcu tissue and bone were all debrided using pickup, knife and a rondure.  The wound was copiously irrigated with 3 L of normal saline.  There was a palmar butterfly fragment which was reduced anatomically and pinned in the place.  It is a 2.7 mm lag screw was then placed across this securing it into position.  Bone reduction forceps were then utilized to obtain a near anatomic reduction of the 2 primary portions of the ulnar shaft.  An 8 hole Acumed straight ulnar plate was then placed into appropriate position and both locking and nonlocking screws were placed on  either side of the fracture.  3 bicortical screws were placed both proximally and distal to the fracture site achieving excellent fixation and stability through the fracture.  Fluoroscopic images were obtained which showed appropriate plate positioning as well as screw length.  At this point the wound was once again  irrigated with normal saline.  The skin was closed with deep 3-0 Vicryl sutures followed by a running 4-0 Prolene horizontal mattress suture.  Attention was then turned to the radial head.  A direct approach to the lateral elbow was utilized with an incision centered about the lateral epicondyle.  It was extended distally heading towards Lister's tubercle.  There was a large rent in the extensor musculature and fascia from the traumatic injury.  This was utilized to access the radial neck fracture as well as the elbow joint.  The annular ligament was intact and was incised to remove the radial head in its entirety.  The proximal radius was then brought up into the wound and the canal was sounded and found.  Sequential reamers were then used and there was excellent fit with a size 8.  There was some comminution about the end of the radius and the bone was very brittle.  The 18 mm radial head and a size 8 stem were then and inserted for trial.  This had excellent range of motion and stability.  The 58mm +2 head and 8 stem were then impacted in the place.  There was poor fixation though of this implant as there had been some subsequent bone loss along the superior aspect of the cut and.  Because of this the decision was made to exchange the short stem first long stemmed implant.  The radius was reamed distally using the longer reamers for a size 6.  We attempted to ream for size 8 which was the next size up but this was too large.  The size 6 trial was found to be loose in the canal so bone graft was prepped from the cut and removed radial head.  This was then impacted into the canal as well as some DBX.  The size 6 stem as well as the 18+2 head were then inserted and found to have stable fixation upon impaction.  Radiographs were obtained with fluoroscopy Intra-Op and showed appropriate positioning and size of the radial head and neck.  At this point the wound was once again irrigated with normal saline.  The  annular ligament was closed with an interrupted 0 Vicryl suture.  The extensor mechanism was then also closed with interrupted 0 Vicryl sutures.  The skin was closed with deep 3-0 Vicryl followed by running 4-0 Prolene horizontal mattress suture.  Both wounds were dressed with Xeroform, 4 x 4's and a well-padded long-arm splint was placed.  The tourniquet was released and the patient had return of brisk capillary refill to all of her digits.  She was awoken from anesthesia and extubated in the operating room without any complications.  She was taken to the PACU in stable condition.    RADIOGRAPHIC INTERPRETATION: Multiple fluoroscopic images of the elbow were obtained intraoperative.  These show near-anatomic alignment of the ulnar shaft fracture with plate and screw construct.  Plate is appropriately positioned as our screw lengths.  There is also interval replacement of the radial head utilizing a longstem implant.  No new fractures or dislocations are noted  ESTIMATED BLOOD LOSS: 20 mL  TOURNIQUET TIME: 130 minutes  SPECIMENS: None  POSTOPERATIVE PLAN: Patient will  be transferred back to the floor for standard postoperative care and admission to the trauma service.  In terms of her right upper extremity will continue her splint for the next 1 to 2 weeks and begin some early gentle range of motion to prevent postoperative stiffness with therapy.  She will be immobilized in the interim with a removable long-arm splint.  IMPLANTS: Acumed 8 hole ulnar straight plate with locking and nonlocking screws.  Long stemmed size 6 radial head stem with a 18+2 replacement head.

## 2019-03-13 NOTE — ED Notes (Signed)
Iv attempted wihtut success.

## 2019-03-13 NOTE — Consult Note (Signed)
Reason for Consult:Right FA fx Referring Physician: Edison Pace is an 53 y.o. female.  HPI: Diamond Bowman suffered an unwitnessed fall this morning. She likely fell down a number of steps. Her caregiver heard the crash and found her at the bottom of the stairs. She was brought to the ED where x-rays showed an open right FA fx and orthopedic surgery was consulted. She has a severe developmental delay and can't contribute to history.  Past Medical History:  Diagnosis Date  . Fall   . Heart murmur   . Mental retardation   . Schizophrenia   . Seizures   . Tardive dyskinesia     No past surgical history on file.  Family History  Problem Relation Age of Onset  . Osteoarthritis Mother   . Diabetes Father     Social History:  reports that she has never smoked. She does not have any smokeless tobacco history on file. She reports that she does not drink alcohol or use drugs.  Allergies: No Known Allergies  Medications: I have reviewed the patient's current medications.  Results for orders placed or performed during the hospital encounter of 03/13/19 (from the past 48 hour(s))  Basic metabolic panel     Status: Abnormal   Collection Time: 03/13/19  7:01 AM  Result Value Ref Range   Sodium 143 135 - 145 mmol/L   Potassium 4.4 3.5 - 5.1 mmol/L   Chloride 106 98 - 111 mmol/L   CO2 26 22 - 32 mmol/L   Glucose, Bld 108 (H) 70 - 99 mg/dL   BUN 36 (H) 6 - 20 mg/dL   Creatinine, Ser 1.52 (H) 0.44 - 1.00 mg/dL   Calcium 8.5 (L) 8.9 - 10.3 mg/dL   GFR calc non Af Amer 38 (L) >60 mL/min   GFR calc Af Amer 44 (L) >60 mL/min   Anion gap 11 5 - 15    Comment: Performed at [facility], [address] [place], [place] [address]  CBC with Differential     Status: Abnormal   Collection Time: 03/13/19  7:01 AM  Result Value Ref Range   WBC 10.3 4.0 - 10.5 K/uL   RBC 3.66 (L) 3.87 - 5.11 MIL/uL   Hemoglobin 11.7 (L) 12.0 - 15.0 g/dL   HCT 37.8 36.0 - 46.0 %   MCV 103.3 (H) 80.0 -  100.0 fL   MCH 32.0 26.0 - 34.0 pg   MCHC 31.0 30.0 - 36.0 g/dL   RDW 15.6 (H) 11.5 - 15.5 %   Platelets 82 (L) 150 - 400 K/uL    Comment: REPEATED TO VERIFY PLATELET COUNT CONFIRMED BY SMEAR Immature Platelet Fraction may be clinically indicated, consider ordering this additional test [id number]    nRBC 0.0 0.0 - 0.2 %   Neutrophils Relative % 66 %   Neutro Abs 6.8 1.7 - 7.7 K/uL   Lymphocytes Relative 23 %   Lymphs Abs 2.4 0.7 - 4.0 K/uL   Monocytes Relative 9 %   Monocytes Absolute 0.9 0.1 - 1.0 K/uL   Eosinophils Relative 1 %   Eosinophils Absolute 0.1 0.0 - 0.5 K/uL   Basophils Relative 0 %   Basophils Absolute 0.0 0.0 - 0.1 K/uL   Immature Granulocytes 1 %   Abs Immature Granulocytes 0.09 (H) 0.00 - 0.07 K/uL    Comment: Performed at [facility], [place] [address], [place], [place] [address]    Dg Elbow Complete Right  Result Date:  03/13/2019 CLINICAL DATA:  Pain following fall EXAM: RIGHT ELBOW - COMPLETE 3+ VIEW COMPARISON:  None. FINDINGS: Frontal, lateral, and bilateral oblique views were obtained. There is a comminuted fracture through the proximal radial metaphysis with lateral displacement of the distal major fracture fragment with respect to the major proximal fragment. There is a comminuted fracture of the proximal ulnar diaphysis with anterior and lateral displacement of the distal major fracture fragment with respect proximal fragment. Soft tissue air is noted in these areas suggesting open type fracture. There is no frank dislocation. There are foci of calcification in the joint which likely represent arthropathic changes. IMPRESSION: Comminuted fractures of proximal radius and ulna with displacement of fracture fragments. No gross dislocation. Areas of soft tissue air in the region of the proximal ulnar fracture suggests open type fracture. Intra-articular calcification is likely of arthropathic etiology. Electronically Signed   By: Lowella Grip III M.D.   On:  03/13/2019 08:25   Dg Forearm Right  Result Date: 03/13/2019 CLINICAL DATA:  Pain following fall EXAM: RIGHT FOREARM - 2 VIEW COMPARISON:  None. FINDINGS: Frontal and lateral views obtained. There is a comminuted fracture of the proximal radial metaphysis with lateral displacement and volar angulation of the distal fracture fragment with respect proximal fragment. There is a comminuted fracture of the proximal ulnar diaphysis with volar and medial displacement of the distal major fracture fragment respect major proximal fragment. Soft tissue air in this area suggests open type fracture. No more distal fractures are evident. No dislocation. The joint spaces appear grossly intact. IMPRESSION: Comminuted fractures of the proximal radius and ulna with soft tissue air suggesting open fracture. No dislocation. No appreciable joint space narrowing or erosion. Electronically Signed   By: Lowella Grip III M.D.   On: 03/13/2019 08:26   Ct Head Wo Contrast  Result Date: 03/13/2019 CLINICAL DATA:  Fall down steps. EXAM: CT HEAD WITHOUT CONTRAST CT CERVICAL SPINE WITHOUT CONTRAST TECHNIQUE: Multidetector CT imaging of the head and cervical spine was performed following the standard protocol without intravenous contrast. Multiplanar CT image reconstructions of the cervical spine were also generated. COMPARISON:  01/04/2015 FINDINGS: CT HEAD FINDINGS Brain: No acute intracranial abnormality. Specifically, no hemorrhage, hydrocephalus, mass lesion, acute infarction, or significant intracranial injury. Vascular: No hyperdense vessel or unexpected calcification. Skull: No acute calvarial abnormality. Sinuses/Orbits: Short air-fluid levels and mild mucosal thickening in the maxillary sinuses. Mastoid air cells clear. Other: None CT CERVICAL SPINE FINDINGS Alignment: No subluxation Skull base and vertebrae: Fractures through the right lateral C5 and C6 masses. No vertebral body involvement. Soft tissues and spinal canal:  No prevertebral fluid or swelling. No visible canal hematoma. 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 Large flowing osteophytes anteriorly. Mild degenerative facet disease. Upper chest: No acute findings Other: None IMPRESSION: No acute intracranial abnormality. Nondisplaced fractures through the right C5 and C6 lateral masses. These results were called by telephone at the time of interpretation on 03/13/2019 at 8:41 am to provider [facility] , who verbally acknowledged these results. Electronically Signed   By: Rolm Baptise M.D.   On: 03/13/2019 08:42   Ct Cervical Spine Wo Contrast  Result Date: 03/13/2019 CLINICAL DATA:  Fall down steps. EXAM: CT HEAD WITHOUT CONTRAST CT CERVICAL SPINE WITHOUT CONTRAST TECHNIQUE: Multidetector CT imaging of the head and cervical spine was performed following the standard protocol without intravenous contrast. Multiplanar CT image reconstructions of the cervical spine were also generated. COMPARISON:  01/04/2015 FINDINGS: CT HEAD FINDINGS Brain: No acute intracranial  abnormality. Specifically, no hemorrhage, hydrocephalus, mass lesion, acute infarction, or significant intracranial injury. Vascular: No hyperdense vessel or unexpected calcification. Skull: No acute calvarial abnormality. Sinuses/Orbits: Short air-fluid levels and mild mucosal thickening in the maxillary sinuses. Mastoid air cells clear. Other: None CT CERVICAL SPINE FINDINGS Alignment: No subluxation Skull base and vertebrae: Fractures through the right lateral C5 and C6 masses. No vertebral body involvement. Soft tissues and spinal canal: No prevertebral fluid or swelling. No visible canal hematoma. 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 Large flowing osteophytes anteriorly. Mild degenerative facet disease. Upper chest: No acute findings Other: None IMPRESSION: No acute intracranial abnormality. Nondisplaced fractures through the right C5 and C6 lateral masses. These results were called by telephone at  the time of interpretation on 03/13/2019 at 8:41 am to provider [facility] , who verbally acknowledged these results. Electronically Signed   By: Rolm Baptise M.D.   On: 03/13/2019 08:42   Dg Humerus Right  Result Date: 03/13/2019 CLINICAL DATA:  Fall down stairs today. Right arm pain. Initial encounter. EXAM: RIGHT HUMERUS - 2+ VIEW COMPARISON:  None. FINDINGS: There is no evidence of fracture or other focal bone lesions. Soft tissues are unremarkable. IMPRESSION: Negative. Electronically Signed   By: Marlaine Hind M.D.   On: 03/13/2019 08:25    Review of Systems  Unable to perform ROS: Mental acuity   Blood pressure 93/73, pulse 99, temperature 98 F (36.7 C), temperature source Oral, resp. rate 13, SpO2 99 %. Physical Exam  Constitutional: She appears well-developed and well-nourished. No distress.  HENT:  Head: Normocephalic and atraumatic.  Eyes: Conjunctivae are normal. Right eye exhibits no discharge. Left eye exhibits no discharge. No scleral icterus.  Neck: Normal range of motion.  Cardiovascular: Normal rate and regular rhythm.  Respiratory: Effort normal. No respiratory distress.  Musculoskeletal:     Comments: Right shoulder, elbow, wrist, digits- Proximal posterior puncture wounds FA, no instability, no blocks to motion  Sens  Ax/R/M/U intact  Mot   Ax/ R/ PIN/ M/ AIN/ U grossly intact  Rad 2+  Neurological: She is alert.  Skin: Skin is warm and dry. She is not diaphoretic.  Psychiatric: She has a normal mood and affect. Her behavior is normal.    Assessment/Plan: Right open ulna fx/radial head fx -- Will plan on I&D, ORIF ulna, and radial head replacement by Dr. Jeannie Fend this afternoon. Please keep NPO. C5/6 fxs -- C-collar Multiple medical problems including MR, schizophrenia, seizures, and tardive dyskinesia -- per primary service    Lisette Abu, PA-C Orthopedic Surgery [phone] 03/13/2019, 10:20 AM

## 2019-03-13 NOTE — ED Notes (Signed)
[place] c-clar applied with PA

## 2019-03-14 ENCOUNTER — Inpatient Hospital Stay (HOSPITAL_COMMUNITY): Payer: Medicaid Other

## 2019-03-14 LAB — BASIC METABOLIC PANEL
Anion gap: 9 (ref 5–15)
BUN: 31 mg/dL — ABNORMAL HIGH (ref 6–20)
CO2: 24 mmol/L (ref 22–32)
Calcium: 7.7 mg/dL — ABNORMAL LOW (ref 8.9–10.3)
Chloride: 103 mmol/L (ref 98–111)
Creatinine, Ser: 1.54 mg/dL — ABNORMAL HIGH (ref 0.44–1.00)
GFR calc Af Amer: 44 mL/min — ABNORMAL LOW (ref >60)
GFR calc non Af Amer: 38 mL/min — ABNORMAL LOW (ref >60)
Glucose, Bld: 132 mg/dL — ABNORMAL HIGH (ref 70–99)
Potassium: 4.9 mmol/L (ref 3.5–5.1)
Sodium: 136 mmol/L (ref 135–145)

## 2019-03-14 LAB — CBC
HCT: 25.6 % — ABNORMAL LOW (ref 36.0–46.0)
Hemoglobin: 8.1 g/dL — ABNORMAL LOW (ref 12.0–15.0)
MCH: 31.3 pg (ref 26.0–34.0)
MCHC: 31.6 g/dL (ref 30.0–36.0)
MCV: 98.8 fL (ref 80.0–100.0)
Platelets: 61 10*3/uL — ABNORMAL LOW (ref 150–400)
RBC: 2.59 MIL/uL — ABNORMAL LOW (ref 3.87–5.11)
RDW: 15.5 % (ref 11.5–15.5)
WBC: 6.9 10*3/uL (ref 4.0–10.5)
nRBC: 0 % (ref 0.0–0.2)

## 2019-03-14 LAB — HIV ANTIBODY (ROUTINE TESTING W REFLEX): HIV Screen 4th Generation wRfx: NONREACTIVE

## 2019-03-14 NOTE — Progress Notes (Signed)
1 Day Post-Op  Subjective: CC: Patient complains of pain in her RUE. No other complaints. Denies neck pain. She is tolerating a diet and passing flatus.   Objective: Vital signs in last 24 hours: Temp:  [98.4 F (36.9 C)-98.6 F (37 C)] 98.4 F (36.9 C) (10/20 0536) Pulse Rate:  [90-111] 90 (10/20 0536) Resp:  [10-23] 18 (10/19 2205) BP: (93-158)/(47-116) 140/61 (10/20 0536) SpO2:  [93 %-100 %] 97 % (10/20 0536) Weight:  [64.4 kg] 64.4 kg (10/19 1047) Last BM Date: 03/12/19  Intake/Output from previous day: 10/19 0701 - 10/20 0700 In: 1514.8 [P.O.:1002; I.V.:373; IV Piggyback:139.8] Out: 470 [Urine:450; Blood:20] Intake/Output this shift: Total I/O In: 360 [P.O.:360] Out: 550 [Urine:550]  PE: Gen:  Alert, NAD, pleasant Neck: C-Collar in place Card:  RRR Pulm:  CTAB, no W/R/R, effort normal Abd: Soft, NT/ND, +BS Ext:  RUE in splint. Moves all digits. SILT of right hand. Cap refill < 2 seconds. All other extremities unremarkable. B/l DP and Left Radial pulse 2+ Skin: no rashes noted, warm and dry  Lab Results:  Recent Labs    03/13/19 0701 03/14/19 0535  WBC 10.3 6.9  HGB 11.7* 8.1*  HCT 37.8 25.6*  PLT 82* 61*   BMET Recent Labs    03/13/19 0701 03/14/19 0535  NA 143 136  K 4.4 4.9  CL 106 103  CO2 26 24  GLUCOSE 108* 132*  BUN 36* 31*  CREATININE 1.52* 1.54*  CALCIUM 8.5* 7.7*   PT/INR No results for input(s): LABPROT, INR in the last 72 hours. CMP     Component Value Date/Time   NA 136 03/14/2019 0535   K 4.9 03/14/2019 0535   CL 103 03/14/2019 0535   CO2 24 03/14/2019 0535   GLUCOSE 132 (H) 03/14/2019 0535   BUN 31 (H) 03/14/2019 0535   CREATININE 1.54 (H) 03/14/2019 0535   CALCIUM 7.7 (L) 03/14/2019 0535   PROT 4.7 (L) 01/04/2015 0520   ALBUMIN 1.9 (L) 01/04/2015 0520   AST 29 01/04/2015 0520   ALT 11 (L) 01/04/2015 0520   ALKPHOS 45 01/04/2015 0520   BILITOT 0.5 01/04/2015 0520   GFRNONAA 38 (L) 03/14/2019 0535   GFRAA 44 (L)  03/14/2019 0535   Lipase  No results found for: LIPASE     Studies/Results: Dg Elbow Complete Right  Result Date: 03/13/2019 CLINICAL DATA:  Pain following fall EXAM: RIGHT ELBOW - COMPLETE 3+ VIEW COMPARISON:  None. FINDINGS: Frontal, lateral, and bilateral oblique views were obtained. There is a comminuted fracture through the proximal radial metaphysis with lateral displacement of the distal major fracture fragment with respect to the major proximal fragment. There is a comminuted fracture of the proximal ulnar diaphysis with anterior and lateral displacement of the distal major fracture fragment with respect proximal fragment. Soft tissue air is noted in these areas suggesting open type fracture. There is no frank dislocation. There are foci of calcification in the joint which likely represent arthropathic changes. IMPRESSION: Comminuted fractures of proximal radius and ulna with displacement of fracture fragments. No gross dislocation. Areas of soft tissue air in the region of the proximal ulnar fracture suggests open type fracture. Intra-articular calcification is likely of arthropathic etiology. Electronically Signed   By: Lowella Grip III M.D.   On: 03/13/2019 08:25   Dg Forearm Right  Result Date: 03/13/2019 CLINICAL DATA:  Pain following fall EXAM: RIGHT FOREARM - 2 VIEW COMPARISON:  None. FINDINGS: Frontal and lateral views obtained. There is a  comminuted fracture of the proximal radial metaphysis with lateral displacement and volar angulation of the distal fracture fragment with respect proximal fragment. There is a comminuted fracture of the proximal ulnar diaphysis with volar and medial displacement of the distal major fracture fragment respect major proximal fragment. Soft tissue air in this area suggests open type fracture. No more distal fractures are evident. No dislocation. The joint spaces appear grossly intact. IMPRESSION: Comminuted fractures of the proximal radius and  ulna with soft tissue air suggesting open fracture. No dislocation. No appreciable joint space narrowing or erosion. Electronically Signed   By: Lowella Grip III M.D.   On: 03/13/2019 08:26   Ct Head Wo Contrast  Result Date: 03/13/2019 CLINICAL DATA:  Fall down steps. EXAM: CT HEAD WITHOUT CONTRAST CT CERVICAL SPINE WITHOUT CONTRAST TECHNIQUE: Multidetector CT imaging of the head and cervical spine was performed following the standard protocol without intravenous contrast. Multiplanar CT image reconstructions of the cervical spine were also generated. COMPARISON:  01/04/2015 FINDINGS: CT HEAD FINDINGS Brain: No acute intracranial abnormality. Specifically, no hemorrhage, hydrocephalus, mass lesion, acute infarction, or significant intracranial injury. Vascular: No hyperdense vessel or unexpected calcification. Skull: No acute calvarial abnormality. Sinuses/Orbits: Short air-fluid levels and mild mucosal thickening in the maxillary sinuses. Mastoid air cells clear. Other: None CT CERVICAL SPINE FINDINGS Alignment: No subluxation Skull base and vertebrae: Fractures through the right lateral C5 and C6 masses. No vertebral body involvement. Soft tissues and spinal canal: No prevertebral fluid or swelling. No visible canal hematoma. 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 Large flowing osteophytes anteriorly. Mild degenerative facet disease. Upper chest: No acute findings Other: None IMPRESSION: No acute intracranial abnormality. Nondisplaced fractures through the right C5 and C6 lateral masses. These results were called by telephone at the time of interpretation on 03/13/2019 at 8:41 am to provider [facility] , who verbally acknowledged these results. Electronically Signed   By: Rolm Baptise M.D.   On: 03/13/2019 08:42   Ct Chest W Contrast  Result Date: 03/13/2019 CLINICAL DATA:  Fall down steps, low back and anterior rib pain EXAM: CT CHEST, ABDOMEN, AND PELVIS WITH CONTRAST TECHNIQUE:  Multidetector CT imaging of the chest, abdomen and pelvis was performed following the standard protocol during bolus administration of intravenous contrast. CONTRAST:  179mL OMNIPAQUE IOHEXOL 300 MG/ML  SOLN COMPARISON:  None. FINDINGS: CT CHEST FINDINGS Cardiovascular: No evidence of traumatic aortic injury. The heart is normal in size.  No pericardial effusion. No evidence thoracic aortic aneurysm. Mediastinum/Nodes: No evidence of anterior mediastinal hematoma. No suspicious mediastinal lymphadenopathy. Visualized thyroid is unremarkable. Lungs/Pleura: Evaluation of the lung parenchyma is constrained by respiratory motion. Within that constraint, there are no suspicious pulmonary nodules. Mild scarring/atelectasis in the lingula and right middle lobe. Very faint ground-glass opacity in the right lower lobe, favoring atelectasis. No focal consolidation. No pleural effusion or pneumothorax. Musculoskeletal: No fracture is seen. Specifically, no evidence of anterior rib fracture. Degenerative changes of the thoracic spine with mildly exaggerated thoracic kyphosis. CT ABDOMEN PELVIS FINDINGS Motion degraded images. Hepatobiliary: Liver is within normal limits. No perihepatic fluid/hemorrhage. Layering small gallstones (series 3/image 87), without associated inflammatory changes. No intrahepatic or extrahepatic ductal dilatation. Pancreas: Within normal limits. Spleen: Within normal limits.  No perisplenic fluid/hemorrhage. Adrenals/Urinary Tract: Adrenal glands are within normal limits. Kidneys are within normal limits.  No hydronephrosis. Bladder is within normal limits. Stomach/Bowel: Stomach is within normal limits. No evidence of bowel obstruction. Normal appendix (series 3/image 94). Vascular/Lymphatic: No evidence of abdominal aortic  aneurysm. No suspicious abdominopelvic lymphadenopathy. Reproductive: Uterus is within normal limits. Bilateral ovaries are within normal limits. Other: No abdominopelvic ascites.  No hemoperitoneum or free air. Musculoskeletal: No fracture is seen. Degenerative changes of the thoracic spine. IMPRESSION: No evidence of traumatic injury to the chest, abdomen, or pelvis. Cholelithiasis, without associated inflammatory changes. Electronically Signed   By: Julian Hy M.D.   On: 03/13/2019 13:56   Ct Cervical Spine Wo Contrast  Result Date: 03/13/2019 CLINICAL DATA:  Fall down steps. EXAM: CT HEAD WITHOUT CONTRAST CT CERVICAL SPINE WITHOUT CONTRAST TECHNIQUE: Multidetector CT imaging of the head and cervical spine was performed following the standard protocol without intravenous contrast. Multiplanar CT image reconstructions of the cervical spine were also generated. COMPARISON:  01/04/2015 FINDINGS: CT HEAD FINDINGS Brain: No acute intracranial abnormality. Specifically, no hemorrhage, hydrocephalus, mass lesion, acute infarction, or significant intracranial injury. Vascular: No hyperdense vessel or unexpected calcification. Skull: No acute calvarial abnormality. Sinuses/Orbits: Short air-fluid levels and mild mucosal thickening in the maxillary sinuses. Mastoid air cells clear. Other: None CT CERVICAL SPINE FINDINGS Alignment: No subluxation Skull base and vertebrae: Fractures through the right lateral C5 and C6 masses. No vertebral body involvement. Soft tissues and spinal canal: No prevertebral fluid or swelling. No visible canal hematoma. 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 Large flowing osteophytes anteriorly. Mild degenerative facet disease. Upper chest: No acute findings Other: None IMPRESSION: No acute intracranial abnormality. Nondisplaced fractures through the right C5 and C6 lateral masses. These results were called by telephone at the time of interpretation on 03/13/2019 at 8:41 am to provider [facility] , who verbally acknowledged these results. Electronically Signed   By: Rolm Baptise M.D.   On: 03/13/2019 08:42   Ct Abdomen Pelvis W Contrast  Result Date:  03/13/2019 CLINICAL DATA:  Fall down steps, low back and anterior rib pain EXAM: CT CHEST, ABDOMEN, AND PELVIS WITH CONTRAST TECHNIQUE: Multidetector CT imaging of the chest, abdomen and pelvis was performed following the standard protocol during bolus administration of intravenous contrast. CONTRAST:  127mL OMNIPAQUE IOHEXOL 300 MG/ML  SOLN COMPARISON:  None. FINDINGS: CT CHEST FINDINGS Cardiovascular: No evidence of traumatic aortic injury. The heart is normal in size.  No pericardial effusion. No evidence thoracic aortic aneurysm. Mediastinum/Nodes: No evidence of anterior mediastinal hematoma. No suspicious mediastinal lymphadenopathy. Visualized thyroid is unremarkable. Lungs/Pleura: Evaluation of the lung parenchyma is constrained by respiratory motion. Within that constraint, there are no suspicious pulmonary nodules. Mild scarring/atelectasis in the lingula and right middle lobe. Very faint ground-glass opacity in the right lower lobe, favoring atelectasis. No focal consolidation. No pleural effusion or pneumothorax. Musculoskeletal: No fracture is seen. Specifically, no evidence of anterior rib fracture. Degenerative changes of the thoracic spine with mildly exaggerated thoracic kyphosis. CT ABDOMEN PELVIS FINDINGS Motion degraded images. Hepatobiliary: Liver is within normal limits. No perihepatic fluid/hemorrhage. Layering small gallstones (series 3/image 87), without associated inflammatory changes. No intrahepatic or extrahepatic ductal dilatation. Pancreas: Within normal limits. Spleen: Within normal limits.  No perisplenic fluid/hemorrhage. Adrenals/Urinary Tract: Adrenal glands are within normal limits. Kidneys are within normal limits.  No hydronephrosis. Bladder is within normal limits. Stomach/Bowel: Stomach is within normal limits. No evidence of bowel obstruction. Normal appendix (series 3/image 94). Vascular/Lymphatic: No evidence of abdominal aortic aneurysm. No suspicious abdominopelvic  lymphadenopathy. Reproductive: Uterus is within normal limits. Bilateral ovaries are within normal limits. Other: No abdominopelvic ascites. No hemoperitoneum or free air. Musculoskeletal: No fracture is seen. Degenerative changes of the thoracic spine. IMPRESSION: No evidence  of traumatic injury to the chest, abdomen, or pelvis. Cholelithiasis, without associated inflammatory changes. Electronically Signed   By: Julian Hy M.D.   On: 03/13/2019 13:56   Dg Humerus Right  Result Date: 03/13/2019 CLINICAL DATA:  Fall down stairs today. Right arm pain. Initial encounter. EXAM: RIGHT HUMERUS - 2+ VIEW COMPARISON:  None. FINDINGS: There is no evidence of fracture or other focal bone lesions. Soft tissues are unremarkable. IMPRESSION: Negative. Electronically Signed   By: Marlaine Hind M.D.   On: 03/13/2019 08:25    Anti-infectives: Anti-infectives (From admission, onward)   Start     Dose/Rate Route Frequency Ordered Stop   03/13/19 2200  ceFAZolin (ANCEF) IVPB 1 g/50 mL premix     1 g 100 mL/hr over 30 Minutes Intravenous Every 8 hours 03/13/19 1756 03/16/19 0559   03/13/19 1400  ceFAZolin (ANCEF) IVPB 1 g/50 mL premix  Status:  Discontinued     1 g 100 mL/hr over 30 Minutes Intravenous Every 8 hours 03/13/19 1217 03/13/19 1756   03/13/19 0715  ceFAZolin (ANCEF) IVPB 2g/100 mL premix     2 g 200 mL/hr over 30 Minutes Intravenous  Once 03/13/19 0709 03/13/19 1146       Assessment/Plan Fall down stairs Open right proximal ulna and radius fractures - S/p I&D, ORIF and radial head replacement by Dr. Jeannie Fend 10/19. NWB. Maintain splint. PT/OT  Non-displaced fractures through R C5 and C6 lateral masses - per NS. Flex/Ex films today. If negative, can remove brace Back pain - CT negative  Hx of seizures - continue antiepileptics Mental delay/Schizophrenia - continue home psych meds Hx of Tardive dyskinesia Elevated Cr - 1.54. Previous Cr 1.02 in 2016. IVF ABL Anemia - Hgb 8.1 from 11.7.     FEN: Reg, IVF VTE: SCDs, Lovenox tomorrow if Hgb stable ID: Ancef for open fx 10/19 - 10/22  Dispo: PT/OT. Flex/Ex. Repeat labs in AM.    LOS: 1 day    Jillyn Ledger , [facility] 03/14/2019, 8:56 AM Pager: [phone]

## 2019-03-14 NOTE — Evaluation (Signed)
Physical Therapy Evaluation Patient Details Name: CHAYLENE LEIPOLD MRN: [id number] DOB: [birth year] Today's Date: 03/14/2019   History of Present Illness  Patient is a 53 year old female who had a fall down several stairs resulting in  displaced, open Monteggia fracture with angulation through the ulna as well as displaced radial neck fracture of RUE. PMH including developmental delay, schizophrenia, seizures, tardive dyskinesia, and heart murmur.   Clinical Impression  Pt in recliner upon arrival of PT today, the patient was cooperative and agreeable, despite initial reluctance to participate. PT session was limited due to pt request to return to the recliner despite multiple attempts to motivate continued mobility. After much conversation, the pt was able to stand from the chair to allow for peri-care due to multiple episodes of incontinence that occurred during session. The patient was able to stand with minA HHA of 1 when completing sit-stand transfer. Although the pt is an unreliable historian, PLF and home arrangement info was ascertained from OT who talked to pt caregiver (foster mom). Recommend pt returns home with family, HHPT, and a PCA once safe to d/c from hospital. The patient will continue to benefit from skilled PT while in house to address functional and mobility limitations.     Follow Up Recommendations Home health PT;Supervision/Assistance - 24 hour;Other (comment)(PCA to assist pt's foster mom)    Equipment Recommendations       Recommendations for Other Services       Precautions / Restrictions Precautions Precautions: Fall Required Braces or Orthoses: Splint/Cast Splint/Cast: RUE Restrictions Weight Bearing Restrictions: Yes RUE Weight Bearing: Non weight bearing Other Position/Activity Restrictions: Encourage ROM of digits      Mobility  Bed Mobility Overal bed mobility: Needs Assistance Bed Mobility: Supine to Sit     Supine to sit: Min assist     General  bed mobility comments: Pt in recliner upon arrival of PT  Transfers Overall transfer level: Needs assistance Equipment used: 1 person hand held assist Transfers: Sit to/from Stand Sit to Stand: Min assist;Mod assist Stand pivot transfers: Min assist       General transfer comment: Pt required significant VCs and encouragement to complete sit-stand, but was able to complete stand with minA once moving.  Ambulation/Gait                Stairs            Wheelchair Mobility    Modified Rankin (Stroke Patients Only)       Balance Overall balance assessment: Mild deficits observed, not formally tested;History of Falls                                           Pertinent Vitals/Pain Pain Assessment: Faces Faces Pain Scale: Hurts little more Pain Location: RUE Pain Descriptors / Indicators: Discomfort;Grimacing Pain Intervention(s): Repositioned;Monitored during session;Limited activity within patient's tolerance    Home Living Family/patient expects to be discharged to:: Private residence Living Arrangements: Other (Comment)(foster mom) Available Help at Discharge: Available 24 hours/day;Other (Comment) Type of Home: House       Home Layout: Two level Home Equipment: Shower seat      Prior Function Level of Independence: Needs assistance   Gait / Transfers Assistance Needed: independent, no DME needs  ADL's / Homemaking Assistance Needed: Pt was independent for ADLs with encouragement for participation  Comments: Lives with [facility] Mother, requires assist for all  ADLs. Goes to [employer] for day care.      Hand Dominance   Dominant Hand: Right    Extremity/Trunk Assessment   Upper Extremity Assessment Upper Extremity Assessment: RUE deficits/detail RUE Deficits / Details: Grossly functional LUE, RUE not assessed due to NWB status RUE: Unable to fully assess due to immobilization RUE Coordination: decreased gross  motor;decreased fine motor(able to wiggle fingers)    Lower Extremity Assessment Lower Extremity Assessment: Overall WFL for tasks assessed    Cervical / Trunk Assessment Cervical / Trunk Assessment: Kyphotic  Communication   Communication: Expressive difficulties;Receptive difficulties  Cognition Arousal/Alertness: Awake/alert Behavior During Therapy: WFL for tasks assessed/performed Overall Cognitive Status: History of cognitive impairments - at baseline                                 General Comments: Pt has developmental delay at baseline. Pt could be convinced to participate in therapy, but benefits from extra processing time and simple language.      General Comments      Exercises     Assessment/Plan    PT Assessment Patient needs continued PT services  PT Problem List Decreased strength;Decreased mobility;Decreased safety awareness;Decreased coordination;Decreased knowledge of precautions;Decreased balance;Decreased cognition;Decreased activity tolerance       PT Treatment Interventions Gait training;Stair training;Functional mobility training;Therapeutic exercise;DME instruction;Balance training;Therapeutic activities;Patient/family education    PT Goals (Current goals can be found in the Care Plan section)  Acute Rehab PT Goals Patient Stated Goal: go home PT Goal Formulation: With patient Time For Goal Achievement: 03/28/19 Potential to Achieve Goals: Good    Frequency Min 3X/week   Barriers to discharge        Co-evaluation               AM-PAC PT "6 Clicks" Mobility  Outcome Measure Help needed turning from your back to your side while in a flat bed without using bedrails?: A Little Help needed moving from lying on your back to sitting on the side of a flat bed without using bedrails?: A Little Help needed moving to and from a bed to a chair (including a wheelchair)?: A Little Help needed standing up from a chair using your arms  (e.g., wheelchair or bedside chair)?: A Little Help needed to walk in hospital room?: A Lot Help needed climbing 3-5 steps with a railing? : A Lot 6 Click Score: 16    End of Session Equipment Utilized During Treatment: Gait belt Activity Tolerance: Patient tolerated treatment well Patient left: in chair;with nursing/sitter in room;with call bell/phone within reach;with chair alarm set Nurse Communication: Other (comment);Mobility status(Pt with 2 instances of incontinence during session, communicated with NT regarding replacement of purewick) PT Visit Diagnosis: Unsteadiness on feet (R26.81);Other abnormalities of gait and mobility (R26.89);Difficulty in walking, not elsewhere classified (R26.2);History of falling (Z91.81)    Time: [id number] PT Time Calculation (min) (ACUTE ONLY): 30 min   Charges:   PT Evaluation $PT Eval Moderate Complexity: 1 Mod PT Treatments $Gait Training: 8-22 mins        Mickey Farber, PT, DPT   Acute Rehabilitation Department [phone]

## 2019-03-14 NOTE — Progress Notes (Signed)
Subjective: Patient seen earlier this morning.    [place] collar again found sitting over her face.  Patient has never been placed in the [place] cervical collar that was ordered.  The patient herself denies any neck pain or discomfort.  Spoke with trauma surgery PA, Alferd Apa, and explained that if cervical spine flexion-extension views showed stability then no cervical collar was necessary, and a soft collar could be used on an as-needed basis for the patient's comfort  Objective: Vital signs in last 24 hours: Vitals:  03/13/19 1810 03/13/19 1846 03/13/19 2205 03/14/19 0536 BP: (!) 127/55 (!) 129/47 139/64 140/61 Pulse: 97 99 92 90 Resp: 14 14 18   Temp:   98.5 F (36.9 C) 98.4 F (36.9 C) TempSrc:   Oral Oral SpO2: 97% 100% 99% 97% Weight:     Height:       Intake/Output from previous day: 10/19 0701 - 10/20 0700 In: 1514.8 [P.O.:1002; I.V.:373; IV Piggyback:139.8] Out: 470 [Urine:450; Blood:20] Intake/Output this shift: Total I/O In: 360 [P.O.:360] Out: 550 [Urine:550]  Physical Exam:    Awake alert, conversant.  Following commands.  Moving all 4 extremities, although movement of right upper extremity limited due to large splint.  CBC Recent Labs   03/13/19 0701 03/14/19 0535 WBC 10.3 6.9 HGB 11.7* 8.1* HCT 37.8 25.6* PLT 82* 61*  BMET Recent Labs   03/13/19 0701 03/14/19 0535 NA 143 136 K 4.4 4.9 CL 106 103 CO2 26 24 GLUCOSE 108* 132* BUN 36* 31* CREATININE 1.52* 1.54* CALCIUM 8.5* 7.7*  Studies/Results: Dg Elbow Complete Right  Result Date: 03/13/2019 CLINICAL DATA:  Pain following fall EXAM: RIGHT ELBOW - COMPLETE 3+ VIEW COMPARISON:  None. FINDINGS: Frontal, lateral, and bilateral oblique views were obtained. There is a comminuted fracture through the proximal radial metaphysis with lateral displacement of the distal major fracture fragment with respect to the major proximal fragment. There is a comminuted fracture of the proximal ulnar  diaphysis with anterior and lateral displacement of the distal major fracture fragment with respect proximal fragment. Soft tissue air is noted in these areas suggesting open type fracture. There is no frank dislocation. There are foci of calcification in the joint which likely represent arthropathic changes. IMPRESSION: Comminuted fractures of proximal radius and ulna with displacement of fracture fragments. No gross dislocation. Areas of soft tissue air in the region of the proximal ulnar fracture suggests open type fracture. Intra-articular calcification is likely of arthropathic etiology. Electronically Signed   By: Lowella Grip III M.D.   On: 03/13/2019 08:25   Dg Forearm Right  Result Date: 03/13/2019 CLINICAL DATA:  Pain following fall EXAM: RIGHT FOREARM - 2 VIEW COMPARISON:  None. FINDINGS: Frontal and lateral views obtained. There is a comminuted fracture of the proximal radial metaphysis with lateral displacement and volar angulation of the distal fracture fragment with respect proximal fragment. There is a comminuted fracture of the proximal ulnar diaphysis with volar and medial displacement of the distal major fracture fragment respect major proximal fragment. Soft tissue air in this area suggests open type fracture. No more distal fractures are evident. No dislocation. The joint spaces appear grossly intact. IMPRESSION: Comminuted fractures of the proximal radius and ulna with soft tissue air suggesting open fracture. No dislocation. No appreciable joint space narrowing or erosion. Electronically Signed   By: Lowella Grip III M.D.   On: 03/13/2019 08:26   Ct Head Wo Contrast  Result Date: 03/13/2019 CLINICAL DATA:  Fall down steps. EXAM: CT HEAD WITHOUT CONTRAST CT  CERVICAL SPINE WITHOUT CONTRAST TECHNIQUE: Multidetector CT imaging of the head and cervical spine was performed following the standard protocol without intravenous contrast. Multiplanar CT image reconstructions of the  cervical spine were also generated. COMPARISON:  01/04/2015 FINDINGS: CT HEAD FINDINGS Brain: No acute intracranial abnormality. Specifically, no hemorrhage, hydrocephalus, mass lesion, acute infarction, or significant intracranial injury. Vascular: No hyperdense vessel or unexpected calcification. Skull: No acute calvarial abnormality. Sinuses/Orbits: Short air-fluid levels and mild mucosal thickening in the maxillary sinuses. Mastoid air cells clear. Other: None CT CERVICAL SPINE FINDINGS Alignment: No subluxation Skull base and vertebrae: Fractures through the right lateral C5 and C6 masses. No vertebral body involvement. Soft tissues and spinal canal: No prevertebral fluid or swelling. No visible canal hematoma. 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 Large flowing osteophytes anteriorly. Mild degenerative facet disease. Upper chest: No acute findings Other: None IMPRESSION: No acute intracranial abnormality. Nondisplaced fractures through the right C5 and C6 lateral masses. These results were called by telephone at the time of interpretation on 03/13/2019 at 8:41 am to provider [facility] VENTER , who verbally acknowledged these results. Electronically Signed   By: Rolm Baptise M.D.   On: 03/13/2019 08:42   Ct Chest W Contrast  Result Date: 03/13/2019 CLINICAL DATA:  Fall down steps, low back and anterior rib pain EXAM: CT CHEST, ABDOMEN, AND PELVIS WITH CONTRAST TECHNIQUE: Multidetector CT imaging of the chest, abdomen and pelvis was performed following the standard protocol during bolus administration of intravenous contrast. CONTRAST:  172mL OMNIPAQUE IOHEXOL 300 MG/ML  SOLN COMPARISON:  None. FINDINGS: CT CHEST FINDINGS Cardiovascular: No evidence of traumatic aortic injury. The heart is normal in size.  No pericardial effusion. No evidence thoracic aortic aneurysm. Mediastinum/Nodes: No evidence of anterior mediastinal hematoma. No suspicious mediastinal lymphadenopathy. Visualized thyroid is  unremarkable. Lungs/Pleura: Evaluation of the lung parenchyma is constrained by respiratory motion. Within that constraint, there are no suspicious pulmonary nodules. Mild scarring/atelectasis in the lingula and right middle lobe. Very faint ground-glass opacity in the right lower lobe, favoring atelectasis. No focal consolidation. No pleural effusion or pneumothorax. Musculoskeletal: No fracture is seen. Specifically, no evidence of anterior rib fracture. Degenerative changes of the thoracic spine with mildly exaggerated thoracic kyphosis. CT ABDOMEN PELVIS FINDINGS Motion degraded images. Hepatobiliary: Liver is within normal limits. No perihepatic fluid/hemorrhage. Layering small gallstones (series 3/image 87), without associated inflammatory changes. No intrahepatic or extrahepatic ductal dilatation. Pancreas: Within normal limits. Spleen: Within normal limits.  No perisplenic fluid/hemorrhage. Adrenals/Urinary Tract: Adrenal glands are within normal limits. Kidneys are within normal limits.  No hydronephrosis. Bladder is within normal limits. Stomach/Bowel: Stomach is within normal limits. No evidence of bowel obstruction. Normal appendix (series 3/image 94). Vascular/Lymphatic: No evidence of abdominal aortic aneurysm. No suspicious abdominopelvic lymphadenopathy. Reproductive: Uterus is within normal limits. Bilateral ovaries are within normal limits. Other: No abdominopelvic ascites. No hemoperitoneum or free air. Musculoskeletal: No fracture is seen. Degenerative changes of the thoracic spine. IMPRESSION: No evidence of traumatic injury to the chest, abdomen, or pelvis. Cholelithiasis, without associated inflammatory changes. Electronically Signed   By: Julian Hy M.D.   On: 03/13/2019 13:56   Ct Cervical Spine Wo Contrast  Result Date: 03/13/2019 CLINICAL DATA:  Fall down steps. EXAM: CT HEAD WITHOUT CONTRAST CT CERVICAL SPINE WITHOUT CONTRAST TECHNIQUE: Multidetector CT imaging of the head and  cervical spine was performed following the standard protocol without intravenous contrast. Multiplanar CT image reconstructions of the cervical spine were also generated. COMPARISON:  01/04/2015 FINDINGS: CT HEAD FINDINGS  Brain: No acute intracranial abnormality. Specifically, no hemorrhage, hydrocephalus, mass lesion, acute infarction, or significant intracranial injury. Vascular: No hyperdense vessel or unexpected calcification. Skull: No acute calvarial abnormality. Sinuses/Orbits: Short air-fluid levels and mild mucosal thickening in the maxillary sinuses. Mastoid air cells clear. Other: None CT CERVICAL SPINE FINDINGS Alignment: No subluxation Skull base and vertebrae: Fractures through the right lateral C5 and C6 masses. No vertebral body involvement. Soft tissues and spinal canal: No prevertebral fluid or swelling. No visible canal hematoma. 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 Large flowing osteophytes anteriorly. Mild degenerative facet disease. Upper chest: No acute findings Other: None IMPRESSION: No acute intracranial abnormality. Nondisplaced fractures through the right C5 and C6 lateral masses. These results were called by telephone at the time of interpretation on 03/13/2019 at 8:41 am to provider [facility] VENTER , who verbally acknowledged these results. Electronically Signed   By: Rolm Baptise M.D.   On: 03/13/2019 08:42   Ct Abdomen Pelvis W Contrast  Result Date: 03/13/2019 CLINICAL DATA:  Fall down steps, low back and anterior rib pain EXAM: CT CHEST, ABDOMEN, AND PELVIS WITH CONTRAST TECHNIQUE: Multidetector CT imaging of the chest, abdomen and pelvis was performed following the standard protocol during bolus administration of intravenous contrast. CONTRAST:  158mL OMNIPAQUE IOHEXOL 300 MG/ML  SOLN COMPARISON:  None. FINDINGS: CT CHEST FINDINGS Cardiovascular: No evidence of traumatic aortic injury. The heart is normal in size.  No pericardial effusion. No evidence thoracic aortic  aneurysm. Mediastinum/Nodes: No evidence of anterior mediastinal hematoma. No suspicious mediastinal lymphadenopathy. Visualized thyroid is unremarkable. Lungs/Pleura: Evaluation of the lung parenchyma is constrained by respiratory motion. Within that constraint, there are no suspicious pulmonary nodules. Mild scarring/atelectasis in the lingula and right middle lobe. Very faint ground-glass opacity in the right lower lobe, favoring atelectasis. No focal consolidation. No pleural effusion or pneumothorax. Musculoskeletal: No fracture is seen. Specifically, no evidence of anterior rib fracture. Degenerative changes of the thoracic spine with mildly exaggerated thoracic kyphosis. CT ABDOMEN PELVIS FINDINGS Motion degraded images. Hepatobiliary: Liver is within normal limits. No perihepatic fluid/hemorrhage. Layering small gallstones (series 3/image 87), without associated inflammatory changes. No intrahepatic or extrahepatic ductal dilatation. Pancreas: Within normal limits. Spleen: Within normal limits.  No perisplenic fluid/hemorrhage. Adrenals/Urinary Tract: Adrenal glands are within normal limits. Kidneys are within normal limits.  No hydronephrosis. Bladder is within normal limits. Stomach/Bowel: Stomach is within normal limits. No evidence of bowel obstruction. Normal appendix (series 3/image 94). Vascular/Lymphatic: No evidence of abdominal aortic aneurysm. No suspicious abdominopelvic lymphadenopathy. Reproductive: Uterus is within normal limits. Bilateral ovaries are within normal limits. Other: No abdominopelvic ascites. No hemoperitoneum or free air. Musculoskeletal: No fracture is seen. Degenerative changes of the thoracic spine. IMPRESSION: No evidence of traumatic injury to the chest, abdomen, or pelvis. Cholelithiasis, without associated inflammatory changes. Electronically Signed   By: Julian Hy M.D.   On: 03/13/2019 13:56   Dg Cerv Spine Flex&ext Only  Result Date: 03/14/2019 CLINICAL  DATA:  Known C5 and C6 fractures EXAM: CERVICAL SPINE - FLEXION AND EXTENSION VIEWS ONLY COMPARISON:  CT from the previous day. FINDINGS: Flexion and extension views of the cervical spine were obtained. No definitive instability is seen. Multilevel degenerative changes are noted. The known fractures of C5 and C6 are not well appreciated on this exam. IMPRESSION: No significant instability on flexion and extension views. Electronically Signed   By: Inez Catalina M.D.   On: 03/14/2019 10:47   Dg Humerus Right  Result Date: 03/13/2019 CLINICAL DATA:  Fall down stairs today. Right arm pain. Initial encounter. EXAM: RIGHT HUMERUS - 2+ VIEW COMPARISON:  None. FINDINGS: There is no evidence of fracture or other focal bone lesions. Soft tissues are unremarkable. IMPRESSION: Negative. Electronically Signed   By: Marlaine Hind M.D.   On: 03/13/2019 08:25    Assessment/Plan: Stable from neurosurgical perspective.  Anticipate that her cervical spine is stable. PT and OT pending.  Hosie Spangle, MD 03/14/2019, 12:49 PM.

## 2019-03-14 NOTE — Progress Notes (Signed)
Noted moderate of blood noted on patient dressing to her right arm. Md made aware. Received a call from Md office that I can reinforce dressing, ok to change the ace wrap and kerlix. Abdominal dressing applied with ace wrap. Will continue to monitor.

## 2019-03-14 NOTE — TOC Initial Note (Signed)
Transition of Care [facility]) - Initial/Assessment Note    Patient Details  Name: Diamond Bowman MRN: [id number] Date of Birth: [birth year]  Transition of [facility]) CM/SW Contact:    Ella Bodo, RN Phone Number: 03/14/2019, 4:22 PM  Clinical Narrative: Patient is a 53 year old female who had a fall down several stairs resulting in  displaced, open Monteggia fracture with angulation through the ulna as well as displaced radial neck fracture of RUE. PMH including developmental delay, schizophrenia, seizures, tardive dyskinesia, and heart murmur.   PTA, pt resides in her caregiver's home, where she has lived for 17 years.  She attends a day program while her caregiver works.  PT/OT evaluations in progress; will follow for recommendations.  Spoke with pt's mother by phone to explain TOC role.  Will follow for home needs as patient progresses.                 Expected Discharge Plan: [place] Barriers to Discharge: Continued Medical Work up        Expected Discharge Plan and Services Expected Discharge Plan: [place]   Discharge Planning Services: CM Consult Post Acute Care Choice: [place] arrangements for the past 2 months: Single Family Home                                      Prior Living Arrangements/Services Living arrangements for the past 2 months: Single Family Home Lives with:: Other (Comment)(Caregiver) Patient language and need for interpreter reviewed:: Yes        Need for Family Participation in Patient Care: Yes (Comment) Care giver support system in place?: Yes (comment)   Criminal Activity/Legal Involvement Pertinent to Current Situation/Hospitalization: No - Comment as needed  Emotional Assessment     Affect (typically observed): Appropriate Orientation: : Oriented to Self, Oriented to Place, Oriented to Situation Alcohol / Substance Use: Not Applicable Psych Involvement: No (comment)  Admission  diagnosis:  Intellectual disability [F79] Fall, initial encounter [W19.XXXA] Open comminuted fracture of proximal ulna, right, type I or II, initial encounter [S52.091B] Type I or II open comminuted fracture of proximal end of right radius, initial encounter [S52.181B] Other closed nondisplaced fracture of fifth cervical vertebra, initial encounter ([place]) [S12.491A] Other closed nondisplaced fracture of sixth cervical vertebra, initial encounter [facility]) [S12.591A] Patient Active Problem List   Diagnosis Date Noted  . Fall down stairs 03/13/2019  . Thrombocytopenia ([place])   . Encephalopathy, metabolic [id number]  . Chronic kidney disease, stage 3 01/04/2015  . Encephalopathy 01/04/2015  . Schizophrenia ([place]) 12/28/2014  . Mental retardation 05/14/2012  . Seizure ([place]) 05/14/2012  . Bipolar 2 disorder ([place]) 05/14/2012  . Fall 05/14/2012  . Hypothyroid 05/14/2012  . Osteoporosis 05/14/2012   PCP:  Everardo Beals, NP Pharmacy:   [facility], [place] - [address] [place] [address] Phone: [phone] Fax: [phone]   Reinaldo Raddle, RN, BSN  Trauma/Neuro ICU Case Manager [phone]

## 2019-03-14 NOTE — Progress Notes (Signed)
   Ortho Hand Progress Note  Subjective: No acute events last night. Pain well controlled.   Objective: Vital signs in last 24 hours: Temp:  [98.4 F (36.9 C)-98.6 F (37 C)] 98.4 F (36.9 C) (10/20 0536) Pulse Rate:  [90-111] 90 (10/20 0536) Resp:  [10-23] 18 (10/19 2205) BP: (93-158)/(47-116) 140/61 (10/20 0536) SpO2:  [93 %-100 %] 97 % (10/20 0536) Weight:  [64.4 kg] 64.4 kg (10/19 1047)  Intake/Output from previous day: 10/19 0701 - 10/20 0700 In: 1514.8 [P.O.:1002; I.V.:373; IV Piggyback:139.8] Out: 470 [Urine:450; Blood:20] Intake/Output this shift: No intake/output data recorded.  Recent Labs    03/13/19 0701 03/14/19 0535  HGB 11.7* 8.1*   Recent Labs    03/13/19 0701 03/14/19 0535  WBC 10.3 6.9  RBC 3.66* 2.59*  HCT 37.8 25.6*  PLT 82* 61*   Recent Labs    03/13/19 0701 03/14/19 0535  NA 143 136  K 4.4 4.9  CL 106 103  CO2 26 24  BUN 36* 31*  CREATININE 1.52* 1.54*  GLUCOSE 108* 132*  CALCIUM 8.5* 7.7*   No results for input(s): LABPT, INR in the last 72 hours.  Awake and alert. At baseline mental status. nad Resp nonlabored RRR RUE: dressings c/d/i. Exposed digits with minimal swelling. Motor intact ain/pin/u. SILT m/u/r. Figners wwp with bcr.    Assessment/Plan: Right open montaggia fracture with displaced ulnar shaft and radial neck fractures, s/p I&D, ORIF ulna and radial head replacement  - Trauma primary. Appreciate management - Continue RUE splint. Encourage digit ROM - NWB RUE - continue abx for 24 post op - RUE elevation - Remainder per primary   Avanell Shackleton III 03/14/2019, 7:15 AM  [phone]

## 2019-03-14 NOTE — Progress Notes (Signed)
Occupational Therapy Evaluation Patient Details Name: Diamond Bowman MRN: [id number] DOB: 28-Sep-1965 Today's Date: 03/14/2019    History of Present Illness Patient is a 53 year old female who had a fall down several stairs resulting in  displaced, open Monteggia fracture with angulation through the ulna as well as displaced radial neck fracture of RUE. PMH including developmental delay, schizophrenia, seizures, tardive dyskinesia, and heart murmur.    Clinical Impression   PTA, pt lived with foster mother in two story home, was independent for ADLs, and spends days at [employer] day program. Pt currently presents with decreased balance, activity tolerance, and increased pain impacting safe performance of ADLs. Pt requires min A for functional mobility, mod A for UB dressing, and max A for LB ADLs. Pt would benefit from OT acutely to facilitate safe dc. Recommend dc home with HHOT and home health aide to increase safe performance of ADLs.     Follow Up Recommendations  Home health OT;Supervision/Assistance - 24 hour;Other (comment)(Home Health Aide)    [facility] bed;3 in 1 bedside commode    Recommendations for Other Services PT consult     Precautions / Restrictions Precautions Precautions: Fall Required Braces or Orthoses: Splint/Cast Splint/Cast: RUE Restrictions Weight Bearing Restrictions: Yes RUE Weight Bearing: Non weight bearing Other Position/Activity Restrictions: Encourage ROM of digits      Mobility Bed Mobility Overal bed mobility: Needs Assistance Bed Mobility: Supine to Sit     Supine to sit: Min assist     General bed mobility comments: required HHA to elevate trunk to sit EOB  Transfers Overall transfer level: Needs assistance Equipment used: 1 person hand held assist Transfers: Stand Pivot Transfers;Sit to/from Stand Sit to Stand: Min assist Stand pivot transfers: Min assist       General transfer comment:  Pt required min A for safety and balance    Balance Overall balance assessment: Mild deficits observed, not formally tested                                         ADL either performed or assessed with clinical judgement   ADL Overall ADL's : Needs assistance/impaired Eating/Feeding: Set up;Sitting Eating/Feeding Details (indicate cue type and reason): required set up to open containers and cut food to eat with LUE Grooming: Minimal assistance;Sitting   Upper Body Bathing: Moderate assistance;Sitting   Lower Body Bathing: Sit to/from stand;Maximal assistance   Upper Body Dressing : Moderate assistance;Sitting   Lower Body Dressing: Sit to/from stand;Maximal assistance   Toilet Transfer: Minimal assistance;Stand-pivot(simulated to recliner) Toilet Transfer Details (indicate cue type and reason): simulated to recliner, required min A to pivot Toileting- Clothing Manipulation and Hygiene: Moderate assistance;Sit to/from stand       Functional mobility during ADLs: Minimal assistance General ADL Comments: Pt requires min-mod A for UB ADLs, max A for LB ADLs. Requires assist at baseline     Vision         Perception     Praxis      Pertinent Vitals/Pain Pain Assessment: Faces Faces Pain Scale: Hurts little more Pain Location: RUE Pain Descriptors / Indicators: Discomfort;Grimacing Pain Intervention(s): Monitored during session;Repositioned;Limited activity within patient's tolerance     Hand Dominance Right   Extremity/Trunk Assessment Upper Extremity Assessment Upper Extremity Assessment: RUE deficits/detail RUE Deficits / Details: displaced, open Monteggia fracture with angulation through the ulna as well as  displaced radial neck fracture. NWB through RUE RUE: Unable to fully assess due to immobilization;Unable to fully assess due to pain RUE Coordination: decreased gross motor;decreased fine motor(able to wiggle fingers)   Lower Extremity  Assessment Lower Extremity Assessment: Defer to PT evaluation   Cervical / Trunk Assessment Cervical / Trunk Assessment: Kyphotic   Communication Communication Communication: Expressive difficulties;Receptive difficulties   Cognition Arousal/Alertness: Awake/alert Behavior During Therapy: WFL for tasks assessed/performed Overall Cognitive Status: History of cognitive impairments - at baseline                                 General Comments: Pt has developmental delay at baseline(Simultaneous filing. User may not have seen previous data.)   General Comments       Exercises     Shoulder Instructions      Home Living Family/patient expects to be discharged to:: Private residence Living Arrangements: Other (Comment)(Foster mom) Available Help at Discharge: Available 24 hours/day;Other (Comment)(Lives with [employer], goes to day care [facility]) Type of Home: House       Home Layout: Two level(Simultaneous filing. User may not have seen previous data.)   Alternate Level Stairs-Rails: Right Bathroom Shower/Tub: Teacher, early years/pre: Standard     Home Equipment: Shower seat          Prior Functioning/Environment Level of Independence: Needs assistance  Gait / Transfers Assistance Needed: independent, no DME needs ADL's / Homemaking Assistance Needed: Pt was independent for ADLs with encouragement for participation   Comments: Lives with [facility] Mother, requires assist for all ADLs. Goes to [employer] [facility] for day care.         OT Problem List: Decreased strength;Decreased range of motion;Decreased activity tolerance;Impaired balance (sitting and/or standing);Decreased cognition;Decreased safety awareness;Decreased knowledge of use of DME or AE;Decreased knowledge of precautions;Pain;Impaired UE functional use      OT Treatment/Interventions: Self-care/ADL training;DME and/or AE instruction;Patient/family  education;Therapeutic activities    OT Goals(Current goals can be found in the care plan section) Acute Rehab OT Goals Patient Stated Goal: go home OT Goal Formulation: With patient Time For Goal Achievement: 03/28/19 Potential to Achieve Goals: Good  OT Frequency: Min 3X/week   Barriers to D/C:            Co-evaluation              AM-PAC OT "6 Clicks" Daily Activity     Outcome Measure Help from another person eating meals?: A Little Help from another person taking care of personal grooming?: A Lot Help from another person toileting, which includes using toliet, bedpan, or urinal?: A Lot Help from another person bathing (including washing, rinsing, drying)?: A Lot Help from another person to put on and taking off regular upper body clothing?: A Lot Help from another person to put on and taking off regular lower body clothing?: A Lot 6 Click Score: 13   End of Session Nurse Communication: Mobility status;Other (comment)(Chair alarm on, not plugged into wall. RN notified)  Activity Tolerance: Patient tolerated treatment well Patient left: in chair;with chair alarm set;with call bell/phone within reach  OT Visit Diagnosis: Unsteadiness on feet (R26.81);Other abnormalities of gait and mobility (R26.89);History of falling (Z91.81);Muscle weakness (generalized) (M62.81);Other symptoms and signs involving cognitive function;Pain Pain - Right/Left: Right Pain - part of body: Arm                Time:  [id number] OT Time Calculation (min): 21 min Charges:  OT General Charges $OT Visit: 1 Visit OT Evaluation $OT Eval Moderate Complexity: [place], OT Student  Gus Rankin 03/14/2019, 1:56 PM

## 2019-03-15 ENCOUNTER — Encounter (HOSPITAL_COMMUNITY): Payer: Self-pay | Admitting: Orthopaedic Surgery

## 2019-03-15 LAB — BASIC METABOLIC PANEL
Anion gap: 10 (ref 5–15)
BUN: 16 mg/dL (ref 6–20)
CO2: 26 mmol/L (ref 22–32)
Calcium: 8 mg/dL — ABNORMAL LOW (ref 8.9–10.3)
Chloride: 105 mmol/L (ref 98–111)
Creatinine, Ser: 1.22 mg/dL — ABNORMAL HIGH (ref 0.44–1.00)
GFR calc Af Amer: 58 mL/min — ABNORMAL LOW (ref >60)
GFR calc non Af Amer: 50 mL/min — ABNORMAL LOW (ref >60)
Glucose, Bld: 123 mg/dL — ABNORMAL HIGH (ref 70–99)
Potassium: 4.7 mmol/L (ref 3.5–5.1)
Sodium: 141 mmol/L (ref 135–145)

## 2019-03-15 LAB — CBC
HCT: 24.6 % — ABNORMAL LOW (ref 36.0–46.0)
Hemoglobin: 8.2 g/dL — ABNORMAL LOW (ref 12.0–15.0)
MCH: 32.3 pg (ref 26.0–34.0)
MCHC: 33.3 g/dL (ref 30.0–36.0)
MCV: 96.9 fL (ref 80.0–100.0)
Platelets: 57 10*3/uL — ABNORMAL LOW (ref 150–400)
RBC: 2.54 MIL/uL — ABNORMAL LOW (ref 3.87–5.11)
RDW: 15.4 % (ref 11.5–15.5)
WBC: 7.1 10*3/uL (ref 4.0–10.5)
nRBC: 0 % (ref 0.0–0.2)

## 2019-03-15 NOTE — Progress Notes (Signed)
PT Cancellation Note  Patient Details Name: Diamond Bowman MRN: [id number] DOB: Jul 14, 1965   Cancelled Treatment:    Reason Eval/Treat Not Completed: Patient declined, no reason specified. Pt declining to get up secondary to pain. Provided max encouragement for pt to participate, but difficult to educate pt on benefits of mobility secondary to developmental delays. Pt fixated on getting lunch and requiring frequent redirection to return to topic of mobility. Will attempt to check back when caregiver is present.  [name], PTA Pager [phone] Acute Rehab  [name] 03/15/2019, 11:04 AM

## 2019-03-15 NOTE — Progress Notes (Signed)
2 Days Post-Op  Subjective: CC: Patient complains of pain in her right arm.  No other complaints.  She denies any neck pain.  She is tolerating a diet without any difficulties.  She is working well with therapies.  Objective: Vital signs in last 24 hours: Temp:  [98 F (36.7 C)-98.2 F (36.8 C)] 98.2 F (36.8 C) (10/21 0437) Pulse Rate:  [101-104] 102 (10/21 0437) Resp:  [18-19] 18 (10/21 0437) BP: (133-138)/(55-58) 133/55 (10/21 0437) SpO2:  [97 %-100 %] 98 % (10/21 0437) Last BM Date: 03/12/19  Intake/Output from previous day: 10/20 0701 - 10/21 0700 In: 600 [P.O.:600] Out: 1950 [Urine:1950] Intake/Output this shift: No intake/output data recorded.  PE: Gen:  Alert, NAD, pleasant Card:  RRR Pulm:  CTAB, no W/R/R, effort normal Abd: Soft, NT/ND, +BS Ext:  RUE in splint. Moves all digits. SILT of right hand. Cap refill < 2 seconds. All other extremities unremarkable. B/l DP and Left Radial pulse 2+ Skin: no rashes noted, warm and dry  Lab Results:  Recent Labs    03/14/19 0535 03/15/19 0240  WBC 6.9 7.1  HGB 8.1* 8.2*  HCT 25.6* 24.6*  PLT 61* 57*   BMET Recent Labs    03/14/19 0535 03/15/19 0240  NA 136 141  K 4.9 4.7  CL 103 105  CO2 24 26  GLUCOSE 132* 123*  BUN 31* 16  CREATININE 1.54* 1.22*  CALCIUM 7.7* 8.0*   PT/INR No results for input(s): LABPROT, INR in the last 72 hours. CMP     Component Value Date/Time   NA 141 03/15/2019 0240   K 4.7 03/15/2019 0240   CL 105 03/15/2019 0240   CO2 26 03/15/2019 0240   GLUCOSE 123 (H) 03/15/2019 0240   BUN 16 03/15/2019 0240   CREATININE 1.22 (H) 03/15/2019 0240   CALCIUM 8.0 (L) 03/15/2019 0240   PROT 4.7 (L) 01/04/2015 0520   ALBUMIN 1.9 (L) 01/04/2015 0520   AST 29 01/04/2015 0520   ALT 11 (L) 01/04/2015 0520   ALKPHOS 45 01/04/2015 0520   BILITOT 0.5 01/04/2015 0520   GFRNONAA 50 (L) 03/15/2019 0240   GFRAA 58 (L) 03/15/2019 0240   Lipase  No results found for:  LIPASE     Studies/Results: Ct Chest W Contrast  Result Date: 03/13/2019 CLINICAL DATA:  Fall down steps, low back and anterior rib pain EXAM: CT CHEST, ABDOMEN, AND PELVIS WITH CONTRAST TECHNIQUE: Multidetector CT imaging of the chest, abdomen and pelvis was performed following the standard protocol during bolus administration of intravenous contrast. CONTRAST:  160mL OMNIPAQUE IOHEXOL 300 MG/ML  SOLN COMPARISON:  None. FINDINGS: CT CHEST FINDINGS Cardiovascular: No evidence of traumatic aortic injury. The heart is normal in size.  No pericardial effusion. No evidence thoracic aortic aneurysm. Mediastinum/Nodes: No evidence of anterior mediastinal hematoma. No suspicious mediastinal lymphadenopathy. Visualized thyroid is unremarkable. Lungs/Pleura: Evaluation of the lung parenchyma is constrained by respiratory motion. Within that constraint, there are no suspicious pulmonary nodules. Mild scarring/atelectasis in the lingula and right middle lobe. Very faint ground-glass opacity in the right lower lobe, favoring atelectasis. No focal consolidation. No pleural effusion or pneumothorax. Musculoskeletal: No fracture is seen. Specifically, no evidence of anterior rib fracture. Degenerative changes of the thoracic spine with mildly exaggerated thoracic kyphosis. CT ABDOMEN PELVIS FINDINGS Motion degraded images. Hepatobiliary: Liver is within normal limits. No perihepatic fluid/hemorrhage. Layering small gallstones (series 3/image 87), without associated inflammatory changes. No intrahepatic or extrahepatic ductal dilatation. Pancreas: Within normal limits. Spleen:  Within normal limits.  No perisplenic fluid/hemorrhage. Adrenals/Urinary Tract: Adrenal glands are within normal limits. Kidneys are within normal limits.  No hydronephrosis. Bladder is within normal limits. Stomach/Bowel: Stomach is within normal limits. No evidence of bowel obstruction. Normal appendix (series 3/image 94). Vascular/Lymphatic: No  evidence of abdominal aortic aneurysm. No suspicious abdominopelvic lymphadenopathy. Reproductive: Uterus is within normal limits. Bilateral ovaries are within normal limits. Other: No abdominopelvic ascites. No hemoperitoneum or free air. Musculoskeletal: No fracture is seen. Degenerative changes of the thoracic spine. IMPRESSION: No evidence of traumatic injury to the chest, abdomen, or pelvis. Cholelithiasis, without associated inflammatory changes. Electronically Signed   By: Julian Hy M.D.   On: 03/13/2019 13:56   Ct Abdomen Pelvis W Contrast  Result Date: 03/13/2019 CLINICAL DATA:  Fall down steps, low back and anterior rib pain EXAM: CT CHEST, ABDOMEN, AND PELVIS WITH CONTRAST TECHNIQUE: Multidetector CT imaging of the chest, abdomen and pelvis was performed following the standard protocol during bolus administration of intravenous contrast. CONTRAST:  179mL OMNIPAQUE IOHEXOL 300 MG/ML  SOLN COMPARISON:  None. FINDINGS: CT CHEST FINDINGS Cardiovascular: No evidence of traumatic aortic injury. The heart is normal in size.  No pericardial effusion. No evidence thoracic aortic aneurysm. Mediastinum/Nodes: No evidence of anterior mediastinal hematoma. No suspicious mediastinal lymphadenopathy. Visualized thyroid is unremarkable. Lungs/Pleura: Evaluation of the lung parenchyma is constrained by respiratory motion. Within that constraint, there are no suspicious pulmonary nodules. Mild scarring/atelectasis in the lingula and right middle lobe. Very faint ground-glass opacity in the right lower lobe, favoring atelectasis. No focal consolidation. No pleural effusion or pneumothorax. Musculoskeletal: No fracture is seen. Specifically, no evidence of anterior rib fracture. Degenerative changes of the thoracic spine with mildly exaggerated thoracic kyphosis. CT ABDOMEN PELVIS FINDINGS Motion degraded images. Hepatobiliary: Liver is within normal limits. No perihepatic fluid/hemorrhage. Layering small  gallstones (series 3/image 87), without associated inflammatory changes. No intrahepatic or extrahepatic ductal dilatation. Pancreas: Within normal limits. Spleen: Within normal limits.  No perisplenic fluid/hemorrhage. Adrenals/Urinary Tract: Adrenal glands are within normal limits. Kidneys are within normal limits.  No hydronephrosis. Bladder is within normal limits. Stomach/Bowel: Stomach is within normal limits. No evidence of bowel obstruction. Normal appendix (series 3/image 94). Vascular/Lymphatic: No evidence of abdominal aortic aneurysm. No suspicious abdominopelvic lymphadenopathy. Reproductive: Uterus is within normal limits. Bilateral ovaries are within normal limits. Other: No abdominopelvic ascites. No hemoperitoneum or free air. Musculoskeletal: No fracture is seen. Degenerative changes of the thoracic spine. IMPRESSION: No evidence of traumatic injury to the chest, abdomen, or pelvis. Cholelithiasis, without associated inflammatory changes. Electronically Signed   By: Julian Hy M.D.   On: 03/13/2019 13:56   Dg Cerv Spine Flex&ext Only  Result Date: 03/14/2019 CLINICAL DATA:  Known C5 and C6 fractures EXAM: CERVICAL SPINE - FLEXION AND EXTENSION VIEWS ONLY COMPARISON:  CT from the previous day. FINDINGS: Flexion and extension views of the cervical spine were obtained. No definitive instability is seen. Multilevel degenerative changes are noted. The known fractures of C5 and C6 are not well appreciated on this exam. IMPRESSION: No significant instability on flexion and extension views. Electronically Signed   By: Inez Catalina M.D.   On: 03/14/2019 10:47    Anti-infectives: Anti-infectives (From admission, onward)   Start     Dose/Rate Route Frequency Ordered Stop   03/13/19 2200  ceFAZolin (ANCEF) IVPB 1 g/50 mL premix     1 g 100 mL/hr over 30 Minutes Intravenous Every 8 hours 03/13/19 1756 03/16/19 0559   03/13/19 1400  ceFAZolin (ANCEF) IVPB 1 g/50 mL premix  Status:   Discontinued     1 g 100 mL/hr over 30 Minutes Intravenous Every 8 hours 03/13/19 1217 03/13/19 1756   03/13/19 0715  ceFAZolin (ANCEF) IVPB 2g/100 mL premix     2 g 200 mL/hr over 30 Minutes Intravenous  Once 03/13/19 0709 03/13/19 1146       Assessment/Plan Fall down stairs Open right proximal ulna and radius fractures- S/p I&D, ORIF and radial head replacement by Dr. Jeannie Fend 10/19. NWB. Maintain splint. PT/OT  Non-displaced fractures through R C5 and C6 lateral masses- Per NS. Flex/Ex films negative. Splint removed.  Back pain- CT negative  Hx of seizures- continue antiepileptics Mental delay/Schizophrenia - continue home psych meds Hx of Tardive dyskinesia Elevated Cr - Improved from 1.54 to 1.22.  Previous Cr 1.02 in 2016. IVF d/c'd ABL Anemia - Hgb 8.2 and stable.  Thrombocytopenia - Plt 57k  FEN: Regular  VTE: SCDs, Lovenox on hold for thrombocytopenia  ID: Ancef for open fx 10/19 - 10/22  Dispo: PT/OT. Work on Personnel officer and equipment. Discuss dispo with patietns caregiver. Hopefully discharge later today.    LOS: 2 days    Jillyn Ledger , [facility] 03/15/2019, 9:19 AM Pager: [phone]

## 2019-03-15 NOTE — Progress Notes (Signed)
Physical Therapy Treatment Patient Details Name: Diamond Bowman MRN: [id number] DOB: May 21, 1966 Today's Date: 03/15/2019    History of Present Illness Patient is a 53 year old female who had a fall down several stairs resulting in  displaced, open Monteggia fracture with angulation through the ulna as well as displaced radial neck fracture of RUE. PMH including developmental delay, schizophrenia, seizures, tardive dyskinesia, and heart murmur.     PT Comments    Pt required max encouragement to participate with therapy.  Only agreed to stand secondary to need for peri care and placement of dry pad. Once standing, pt would rush to sit back down and declined ambulation. She reports she is scared of falling. Encouraged patient that the chair would be following behind the whole time, but she continued to refuse ambulation. Will continue to follow.    Follow Up Recommendations  Home health PT;Supervision/Assistance - 24 hour;Other (comment)(PCA to assist pt's foster mom)     Equipment Recommendations       Recommendations for Other Services       Precautions / Restrictions Precautions Precautions: Fall Required Braces or Orthoses: Splint/Cast Splint/Cast: RUE Restrictions Weight Bearing Restrictions: Yes RUE Weight Bearing: Non weight bearing Other Position/Activity Restrictions: Encourage ROM of digits    Mobility  Bed Mobility               General bed mobility comments: Pt in recliner upon arrival  Transfers Overall transfer level: Needs assistance Equipment used: 1 person hand held assist Transfers: Sit to/from Stand Sit to Stand: Mod assist         General transfer comment: mod A to stand and remain standing for peri care. Sit<>stand performed 2x  Ambulation/Gait             General Gait Details: pt declining   Stairs             Wheelchair Mobility    Modified Rankin (Stroke Patients Only)       Balance Overall balance assessment:  Mild deficits observed, not formally tested;History of Falls                                          Cognition Arousal/Alertness: Awake/alert Behavior During Therapy: WFL for tasks assessed/performed Overall Cognitive Status: History of cognitive impairments - at baseline                                 General Comments: Pt has developmental delay at baseline. Pt could be convinced to participate in therapy, but benefits from extra processing time and simple language.      Exercises      General Comments        Pertinent Vitals/Pain Pain Assessment: Faces Faces Pain Scale: Hurts a little bit Pain Location: RUE Pain Descriptors / Indicators: Discomfort;Grimacing;Guarding Pain Intervention(s): Monitored during session;Limited activity within patient's tolerance    Home Living                      Prior Function            PT Goals (current goals can now be found in the care plan section) Acute Rehab PT Goals Patient Stated Goal: go home PT Goal Formulation: With patient Time For Goal Achievement: 03/28/19 Potential to Achieve Goals: Good Progress towards PT goals:  Progressing toward goals    Frequency    Min 3X/week      PT Plan Current plan remains appropriate    Co-evaluation              AM-PAC PT "6 Clicks" Mobility   Outcome Measure  Help needed turning from your back to your side while in a flat bed without using bedrails?: A Little Help needed moving from lying on your back to sitting on the side of a flat bed without using bedrails?: A Little Help needed moving to and from a bed to a chair (including a wheelchair)?: A Little Help needed standing up from a chair using your arms (e.g., wheelchair or bedside chair)?: A Lot Help needed to walk in hospital room?: A Lot Help needed climbing 3-5 steps with a railing? : A Lot 6 Click Score: 15    End of Session Equipment Utilized During Treatment: Gait  belt Activity Tolerance: Patient tolerated treatment well Patient left: in chair;with call bell/phone within reach;with chair alarm set Nurse Communication: Other (comment);Mobility status(Need replacement purwik) PT Visit Diagnosis: Unsteadiness on feet (R26.81);Other abnormalities of gait and mobility (R26.89);Difficulty in walking, not elsewhere classified (R26.2);History of falling (Z91.81)     Time: [id number] PT Time Calculation (min) (ACUTE ONLY): 15 min  Charges:  $Therapeutic Activity: 8-22 mins                     Diamond Bowman, [place] Pager [phone] Acute Rehab   Diamond Bowman 03/15/2019, 1:47 PM

## 2019-03-15 NOTE — Care Management (Signed)
Unable to complete SBIRT assessment due to developmental delays/cognitive issues.    Reinaldo Raddle, RN, BSN  Trauma/Neuro ICU Case Manager [phone]

## 2019-03-15 NOTE — Progress Notes (Signed)
Patient suffered an open right proximal ulna and radius fracturesthat is status post open incision, drainage, open reducation internal fixation and radial heal replacement which impairs their ability reposition. A hospital bed would assist in repositioning.

## 2019-03-15 NOTE — Plan of Care (Signed)

## 2019-03-15 NOTE — Anesthesia Postprocedure Evaluation (Signed)
Anesthesia Post Note  Patient: Diamond Bowman  Procedure(s) Performed: IRRIGATION AND DEBRIDEMENT EXTREMITY (Right Arm Lower) OPEN REDUCTION INTERNAL FIXATION (ORIF) ULNAR FRACTURE (Right Arm Lower) RADIAL HEAD ARTHROPLASTY (Right Arm Lower)     Patient location during evaluation: PACU Anesthesia Type: General Level of consciousness: sedated Pain management: pain level controlled Vital Signs Assessment: post-procedure vital signs reviewed and stable Respiratory status: spontaneous breathing Cardiovascular status: stable Postop Assessment: no apparent nausea or vomiting Anesthetic complications: no    Last Vitals:  Vitals:   03/14/19 2014 03/15/19 0437  BP: (!) 136/58 (!) 133/55  Pulse: (!) 101 (!) 102  Resp: 19 18  Temp: 36.7 C 36.8 C  SpO2: 97% 98%    Last Pain:  Vitals:   03/15/19 0437  TempSrc: Axillary  PainSc:    Pain Goal:                   Huston Foley

## 2019-03-15 NOTE — Progress Notes (Signed)
Subjective: Patient without complaints.  Cervical spine flexion extension views showed no instability or malalignment.  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  Objective: Vital signs in last 24 hours: Vitals:   03/14/19 0536 03/14/19 1510 03/14/19 2014 03/15/19 0437  BP: 140/61 (!) 138/58 (!) 136/58 (!) 133/55  Pulse: 90 (!) 104 (!) 101 (!) 102  Resp:  19 19 18   Temp: 98.4 F (36.9 C) 98.2 F (36.8 C) 98 F (36.7 C) 98.2 F (36.8 C)  TempSrc: Oral Oral Axillary Axillary  SpO2: 97% 100% 97% 98%  Weight:      Height:        Intake/Output from previous day: 10/20 0701 - 10/21 0700 In: 600 [P.O.:600] Out: 1950 [Urine:1950] Intake/Output this shift: No intake/output data recorded.  Physical Exam: Moving all 4 extremities, although movement of right upper extremity limited due to right forearm injury.  CBC Recent Labs    03/14/19 0535 03/15/19 0240  WBC 6.9 7.1  HGB 8.1* 8.2*  HCT 25.6* 24.6*  PLT 61* 57*   BMET Recent Labs    03/14/19 0535 03/15/19 0240  NA 136 141  K 4.9 4.7  CL 103 105  CO2 24 26  GLUCOSE 132* 123*  BUN 31* 16  CREATININE 1.54* 1.22*  CALCIUM 7.7* 8.0*    Studies/Results: Dg Cerv Spine Flex&ext Only  Result Date: 03/14/2019 CLINICAL DATA:  Known C5 and C6 fractures EXAM: CERVICAL SPINE - FLEXION AND EXTENSION VIEWS ONLY COMPARISON:  CT from the previous day. FINDINGS: Flexion and extension views of the cervical spine were obtained. No definitive instability is seen. Multilevel degenerative changes are noted. The known fractures of C5 and C6 are not well appreciated on this exam. IMPRESSION: No significant instability on flexion and extension views. Electronically Signed   By: Inez Catalina M.D.   On: 03/14/2019 10:47    Assessment/Plan: Cervical collar DC'd following stable flexion-extension views.  No neurosurgical follow-up needed.  Hosie Spangle, MD 03/15/2019, 1:41 PM

## 2019-03-16 MED ORDER — ACETAMINOPHEN 325 MG PO TABS: 650.0000 mg | ORAL_TABLET | ORAL | Status: DC | PRN

## 2019-03-16 MED ORDER — OXYCODONE HCL 5 MG PO TABS: 5.0000 mg | ORAL_TABLET | Freq: Four times a day (QID) | ORAL | 0 refills | Status: DC | PRN

## 2019-03-16 NOTE — Discharge Planning (Signed)
Pt discharged home in stable condition via [facility]. Attempt made to call caregiver, Charlean Merl, unsuccessful.

## 2019-03-16 NOTE — Progress Notes (Signed)
Occupational Therapy Treatment Patient Details Name: Diamond Bowman MRN: [id number] DOB: 09-02-1965 Today's Date: 03/16/2019    History of present illness Patient is a 53 year old female who had a fall down several stairs resulting in  displaced, open Monteggia fracture with angulation through the ulna as well as displaced radial neck fracture of RUE. PMH including developmental delay, schizophrenia, seizures, tardive dyskinesia, and heart murmur.    OT comments  Pt requires MIN- MOD for functional transfers with no AD. Cues for hand placement and sequencing of task during stand pivot transfer to [facility]. Pt able to initiate anterior pericare with set-up assist utilizing lateral leans but required MAX A for posterior pericare after BM. Pt MIN A for UB ADL; MAX A LB ADL. Pt likely to DC home today; will continue to follow per POC.   Follow Up Recommendations  Home health OT;Supervision/Assistance - 24 hour;Other (comment)(Homehealth aid)    Equipment Recommendations  Hospital bed;3 in 1 bedside commode    Recommendations for Other Services      Precautions / Restrictions Precautions Precautions: Fall Required Braces or Orthoses: Splint/Cast Splint/Cast: RUE Restrictions Weight Bearing Restrictions: Yes RUE Weight Bearing: Non weight bearing Other Position/Activity Restrictions: Encourage ROM of digits- able to move all digits 10/22       Mobility Bed Mobility Overal bed mobility: Needs Assistance Bed Mobility: Supine to Sit     Supine to sit: Min assist     General bed mobility comments: MIN A to scoot hips to EOB  Transfers Overall transfer level: Needs assistance Equipment used: 1 person hand held assist Transfers: Sit to/from [facility] Sit to Stand: Min assist Stand pivot transfers: Mod assist       General transfer comment: MIN A to power up and maintain balance; MOD A for stand pivot to [facility]; cues for hand placement and sequencing of task; pt  slightly unsteady on feet needing assist to maintain balance with no AD    Balance Overall balance assessment: Mild deficits observed, not formally tested;History of Falls                                         ADL either performed or assessed with clinical judgement   ADL Overall ADL's : Needs assistance/impaired         Upper Body Bathing: Supervision/ safety;Sitting;Set up   Lower Body Bathing: Moderate assistance;Sitting/lateral leans Lower Body Bathing Details (indicate cue type and reason): MOD A to clean in between legs after incontience episode Upper Body Dressing : Minimal assistance;Sitting Upper Body Dressing Details (indicate cue type and reason): don hospital gown     Toilet Transfer: Moderate assistance;Stand-pivot;BSC Toilet Transfer Details (indicate cue type and reason): MOD A stand pivot to [facility] with no AD; does well with handheld assist; cues for sequencing of task and to initiate pivotal steps to [place] Manipulation and Hygiene: Moderate assistance;Sitting/lateral lean;Sit to/from stand Toileting - Clothing Manipulation Details (indicate cue type and reason): pt able to assist with anterior pericare from [facility] utilizing lateral leans; therapist assist with clean up in standing with pt holding onto RW for posterior pericare after BM     Functional mobility during ADLs: Minimal assistance;Rolling walker General ADL Comments: MOD A for toilet transfer and pericare after incontient episode; pt MIN- MOD A UB ADL needing cues for sequencing of task and assist for cleanliness     Vision  Perception     Praxis      Cognition Arousal/Alertness: Awake/alert Behavior During Therapy: WFL for tasks assessed/performed Overall Cognitive Status: History of cognitive impairments - at baseline                                 General Comments: pt has developmental delay at baseline; at times hard to understand; benefit  from one step commands and simple language; likes coloring        Exercises     Shoulder Instructions       General Comments pt found saturated in urine    Pertinent Vitals/ Pain       Faces Pain Scale: Hurts a little bit Pain Location: butt- likely d/t need for BM Pain Descriptors / Indicators: Discomfort Pain Intervention(s): Monitored during session;Repositioned;Other (comment)(assisted to [facility])  Home Living                                          Prior Functioning/Environment              Frequency  Min 3X/week        Progress Toward Goals  OT Goals(current goals can now be found in the care plan section)     Acute Rehab OT Goals Patient Stated Goal: go home OT Goal Formulation: With patient Time For Goal Achievement: 03/28/19 Potential to Achieve Goals: Good  Plan Discharge plan remains appropriate    Co-evaluation                 AM-PAC OT "6 Clicks" Daily Activity     Outcome Measure   Help from another person eating meals?: A Little Help from another person taking care of personal grooming?: A Lot Help from another person toileting, which includes using toliet, bedpan, or urinal?: A Lot Help from another person bathing (including washing, rinsing, drying)?: A Lot Help from another person to put on and taking off regular upper body clothing?: A Lot Help from another person to put on and taking off regular lower body clothing?: A Lot 6 Click Score: 13    End of Session Equipment Utilized During Treatment: Rolling walker  OT Visit Diagnosis: Unsteadiness on feet (R26.81);Other abnormalities of gait and mobility (R26.89);History of falling (Z91.81);Muscle weakness (generalized) (M62.81);Other symptoms and signs involving cognitive function;Pain Pain - Right/Left: Right Pain - part of body: Arm   Activity Tolerance Patient tolerated treatment well   Patient Left in chair;with chair alarm set;with call bell/phone within  reach   Nurse Communication Mobility status;Other (comment)(needs new pure wisk; had BM in [facility])        Time: [id number] OT Time Calculation (min): 18 min  Charges: OT General Charges $OT Visit: 1 Visit OT Treatments $Self Care/Home Management : 8-22 mins  [place], [place] [phone] [place] 03/16/2019, 12:00 PM

## 2019-03-16 NOTE — Plan of Care (Signed)

## 2019-03-16 NOTE — Discharge Instructions (Signed)
Please remain non-weightbearing to the right upper extremity.  Please keep your splint in place till your orthopedic follow up    Cast or Splint Care, Adult Casts and splints are supports that are worn to protect broken bones and other injuries. A cast or splint may hold a bone still and in the correct position while it heals. Casts and splints may also help ease pain, swelling, and muscle spasms. A cast is a hardened support that is usually made of fiberglass or plaster. It is custom-fit to the body and it offers more protection than a splint. It cannot be taken off and put back on. A splint is a type of soft support that is usually made from cloth and elastic. It can be adjusted or taken off as needed. You may need a cast or a splint if you:  Have a broken bone.  Have a soft-tissue injury.  Need to keep an injured body part from moving (keep it immobile) after surgery. How is this treated? If you have a cast:   Do not stick anything inside the cast to scratch your skin. Sticking something in the cast increases your risk of infection.  Check the skin around the cast every day. Tell your health care provider about any concerns.  You may put lotion on dry skin around the edges of the cast. Do not put lotion on the skin underneath the cast.  Keep the cast clean.  If the cast is not waterproof: ? Do not let it get wet. ? Cover it with a watertight covering when you take a bath or a shower. If you have a splint:   Wear it as told by your health care provider. Remove it only as told by your health care provider.  Loosen the splint if your fingers or toes tingle, become numb, or turn cold and blue.  Keep the splint clean.  If the splint is not waterproof: ? Do not let it get wet. ? Cover it with a watertight covering when you take a bath or a shower. Bathing  Do not take baths or swim until your health care provider approves. Ask your health care provider if you can take  showers. You may only be allowed to take sponge baths for bathing.  If your cast or splint is not waterproof, cover it with a watertight covering when you take a bath or shower. Managing pain, stiffness, and swelling  Move your fingers or toes often to avoid stiffness and to lessen swelling.  Raise (elevate) the injured area above the level of your heart while sitting or lying down. Safety  Do not use the injured limb to support your body weight until your health care provider says that it is okay.  Use crutches or other assistive devices as told by your health care provider. General instructions  Do not put pressure on any part of the cast or splint until it is fully hardened. This may take several hours.  Return to your normal activities as told by your health care provider. Ask your health care provider what activities are safe for you.  Take over-the-counter and prescription medicines only as told by your health care provider.  Keep all follow-up visits as told by your health care provider. This is important. Contact a health care provider if:  Your cast or splint gets damaged.  The skin around the cast gets red or raw.  The skin under the cast is extremely itchy or painful.  Your cast or  splint feels very uncomfortable.  Your cast or splint is too tight or too loose.  Your cast becomes wet or it develops a soft spot or area.  You get an object stuck under your cast. Get help right away if:  Your pain is getting worse.  The injured area tingles, becomes numb, or turns cold and blue.  The part of your body above or below the cast is swollen and discolored.  You cannot feel or move your fingers or toes.  There is fluid leaking through the cast.  You have severe pain or pressure under the cast.  You have trouble breathing.  You have shortness of breath.  You have chest pain. This information is not intended to replace advice given to you by your health care  provider. Make sure you discuss any questions you have with your health care provider. Document Released: 05/08/2000 Document Revised: 03/08/2017 Document Reviewed: 11/02/2015 Elsevier Patient Education  2020 [employer].     Managing Your Pain After Surgery Without Opioids    Thank you for participating in our program to help patients manage their pain after surgery without opioids. This is part of our effort to provide you with the best care possible, without exposing you or your family to the risk that opioids pose.  What pain can I expect after surgery? You can expect to have some pain after surgery. This is normal. The pain is typically worse the day after surgery, and quickly begins to get better. Many studies have found that many patients are able to manage their pain after surgery with Over-the-Counter (OTC) medications such as Tylenol and Motrin. If you have a condition that does not allow you to take Tylenol or Motrin, notify your surgical team.  How will I manage my pain? The best strategy for controlling your pain after surgery is around the clock pain control with Tylenol (acetaminophen) and Motrin (ibuprofen or Advil). Alternating these medications with each other allows you to maximize your pain control. In addition to Tylenol and Motrin, you can use heating pads or ice packs on your incisions to help reduce your pain.  How will I alternate your regular strength over-the-counter pain medication? You will take a dose of pain medication every three hours. ; Start by taking 650 mg of Tylenol (2 pills of 325 mg) ; 3 hours later take 600 mg of Motrin (3 pills of 200 mg) ; 3 hours after taking the Motrin take 650 mg of Tylenol ; 3 hours after that take 600 mg of Motrin.   - 1 -  See example - if your first dose of Tylenol is at 12:00 PM   12:00 PM Tylenol 650 mg (2 pills of 325 mg)  3:00 PM Motrin 600 mg (3 pills of 200 mg)  6:00 PM Tylenol 650 mg (2 pills of  325 mg)  9:00 PM Motrin 600 mg (3 pills of 200 mg)  Continue alternating every 3 hours   We recommend that you follow this schedule around-the-clock for at least 3 days after surgery, or until you feel that it is no longer needed. Use the table on the last page of this handout to keep track of the medications you are taking. Important: Do not take more than 3000mg  of Tylenol or 3200mg  of Motrin in a 24-hour period. Do not take ibuprofen/Motrin if you have a history of bleeding stomach ulcers, severe kidney disease, &/or actively taking a blood thinner  What if I still have pain? If  you have pain that is not controlled with the over-the-counter pain medications (Tylenol and Motrin or Advil) you might have what we call breakthrough pain. You will receive a prescription for a small amount of an opioid pain medication such as Oxycodone, Tramadol, or Tylenol with Codeine. Use these opioid pills in the first 24 hours after surgery if you have breakthrough pain. Do not take more than 1 pill every 4-6 hours.  If you still have uncontrolled pain after using all opioid pills, don't hesitate to call our staff using the number provided. We will help make sure you are managing your pain in the best way possible, and if necessary, we can provide a prescription for additional pain medication.   Day 1    Time  Name of Medication Number of pills taken  Amount of Acetaminophen  Pain Level   Comments  AM PM       AM PM       AM PM       AM PM       AM PM       AM PM       AM PM       AM PM       Total Daily amount of Acetaminophen Do not take more than  3,000 mg per day      Day 2    Time  Name of Medication Number of pills taken  Amount of Acetaminophen  Pain Level   Comments  AM PM       AM PM       AM PM       AM PM       AM PM       AM PM       AM PM       AM PM       Total Daily amount of Acetaminophen Do not take more than  3,000 mg per day      Day 3    Time  Name of  Medication Number of pills taken  Amount of Acetaminophen  Pain Level   Comments  AM PM       AM PM       AM PM       AM PM          AM PM       AM PM       AM PM       AM PM       Total Daily amount of Acetaminophen Do not take more than  3,000 mg per day      Day 4    Time  Name of Medication Number of pills taken  Amount of Acetaminophen  Pain Level   Comments  AM PM       AM PM       AM PM       AM PM       AM PM       AM PM       AM PM       AM PM       Total Daily amount of Acetaminophen Do not take more than  3,000 mg per day      Day 5    Time  Name of Medication Number of pills taken  Amount of Acetaminophen  Pain Level   Comments  AM PM       AM PM  AM PM       AM PM       AM PM       AM PM       AM PM       AM PM       Total Daily amount of Acetaminophen Do not take more than  3,000 mg per day       Day 6    Time  Name of Medication Number of pills taken  Amount of Acetaminophen  Pain Level  Comments  AM PM       AM PM       AM PM       AM PM       AM PM       AM PM       AM PM       AM PM       Total Daily amount of Acetaminophen Do not take more than  3,000 mg per day      Day 7    Time  Name of Medication Number of pills taken  Amount of Acetaminophen  Pain Level   Comments  AM PM       AM PM       AM PM       AM PM       AM PM       AM PM       AM PM       AM PM       Total Daily amount of Acetaminophen Do not take more than  3,000 mg per day        For additional information about how and where to safely dispose of unused opioid medications - [employer]  Disclaimer: This document contains information and/or instructional materials adapted from [place] for the typical patient with your condition. It does not replace medical advice from your health care provider because your experience may differ from that of the typical patient. Talk to your health care  provider if you have any questions about this document, your condition or your treatment plan. Adapted from [place]

## 2019-03-16 NOTE — Progress Notes (Signed)
3 Days Post-Op  Subjective: CC: Patient complains of pain in her right arm.  No other complaints.  She denies any neck pain. She is tolerating a diet without any difficulties.  She is working well with therapies.  Objective: Vital signs in last 24 hours: Temp:  [98.8 F (37.1 C)-100.5 F (38.1 C)] 99.6 F (37.6 C) (10/22 0808) Pulse Rate:  [101-117] 117 (10/22 0808) Resp:  [14-19] 19 (10/22 0808) BP: (125-150)/(59-73) 146/70 (10/22 0808) SpO2:  [96 %-99 %] 97 % (10/22 0808) Last BM Date: 03/13/19  Intake/Output from previous day: 10/21 0701 - 10/22 0700 In: 544.5 [P.O.:544.5] Out: 800 [Urine:800] Intake/Output this shift: No intake/output data recorded.  PE: Gen: Alert, NAD, pleasant Card: RRR Pulm: CTAB, no W/R/R, effort normal Abd: Soft, NT/ND, +BS Ext:RUE in splint. Moves all digits. SILT of right hand. Cap refill < 2 seconds. All other extremities unremarkable. B/l DP and Left Radial pulse 2+ Skin: no rashes noted, warm and dry  Lab Results:  Recent Labs    03/14/19 0535 03/15/19 0240  WBC 6.9 7.1  HGB 8.1* 8.2*  HCT 25.6* 24.6*  PLT 61* 57*   BMET Recent Labs    03/14/19 0535 03/15/19 0240  NA 136 141  K 4.9 4.7  CL 103 105  CO2 24 26  GLUCOSE 132* 123*  BUN 31* 16  CREATININE 1.54* 1.22*  CALCIUM 7.7* 8.0*   PT/INR No results for input(s): LABPROT, INR in the last 72 hours. CMP     Component Value Date/Time   NA 141 03/15/2019 0240   K 4.7 03/15/2019 0240   CL 105 03/15/2019 0240   CO2 26 03/15/2019 0240   GLUCOSE 123 (H) 03/15/2019 0240   BUN 16 03/15/2019 0240   CREATININE 1.22 (H) 03/15/2019 0240   CALCIUM 8.0 (L) 03/15/2019 0240   PROT 4.7 (L) 01/04/2015 0520   ALBUMIN 1.9 (L) 01/04/2015 0520   AST 29 01/04/2015 0520   ALT 11 (L) 01/04/2015 0520   ALKPHOS 45 01/04/2015 0520   BILITOT 0.5 01/04/2015 0520   GFRNONAA 50 (L) 03/15/2019 0240   GFRAA 58 (L) 03/15/2019 0240   Lipase  No results found for: LIPASE      Studies/Results: Dg Cerv Spine Flex&ext Only  Result Date: 03/14/2019 CLINICAL DATA:  Known C5 and C6 fractures EXAM: CERVICAL SPINE - FLEXION AND EXTENSION VIEWS ONLY COMPARISON:  CT from the previous day. FINDINGS: Flexion and extension views of the cervical spine were obtained. No definitive instability is seen. Multilevel degenerative changes are noted. The known fractures of C5 and C6 are not well appreciated on this exam. IMPRESSION: No significant instability on flexion and extension views. Electronically Signed   By: Inez Catalina M.D.   On: 03/14/2019 10:47    Anti-infectives: Anti-infectives (From admission, onward)   Start     Dose/Rate Route Frequency Ordered Stop   03/13/19 2200  ceFAZolin (ANCEF) IVPB 1 g/50 mL premix     1 g 100 mL/hr over 30 Minutes Intravenous Every 8 hours 03/13/19 1756 03/15/19 2239   03/13/19 1400  ceFAZolin (ANCEF) IVPB 1 g/50 mL premix  Status:  Discontinued     1 g 100 mL/hr over 30 Minutes Intravenous Every 8 hours 03/13/19 1217 03/13/19 1756   03/13/19 0715  ceFAZolin (ANCEF) IVPB 2g/100 mL premix     2 g 200 mL/hr over 30 Minutes Intravenous  Once 03/13/19 0709 03/13/19 1146       Assessment/Plan Fall down stairs Open  right proximal ulna and radius fractures-S/p I&D, ORIF and radial head replacement by Dr. Jeannie Fend 10/19. NWB. Maintain splint. PT/OT Non-displaced fractures through R C5 and C6 lateral masses- Per NS. Flex/Ex films negative. Splint removed. NS signed off.  Back pain-CT negative Hx of seizures- continue antiepileptics Mental delay/Schizophrenia - continue home psych meds Hx of Tardive dyskinesia Elevated Cr - Improved from 1.54 to 1.22.  Previous Cr 1.02 in 2016. Am BMP pending.  ABL Anemia- Hgb 8.2 (10/21) and stable. Await CBC Thrombocytopenia - Plt 57k (10/21). Await CBC  [id number]  VTE: SCDs,Lovenox on hold for thrombocytopenia. Await CBC ID: Ancef for open fx10/19 - 10/22  Dispo: PT/OT. I reached out  to the patients caregiver. She is supposed to be receiving her hospital bed and equipment today. Case management is working on Passenger transport manager. Once this is in place, plan for d/c. Hopefully later today.    LOS: 3 days    Jillyn Ledger , [facility] 03/16/2019, 8:44 AM Pager: [phone]

## 2019-03-16 NOTE — Discharge Summary (Signed)
Patient ID: Diamond CORRENTI [id number] May 19, 1966 53 y.o.  Admit date: 03/13/2019 Discharge date: 03/16/2019  Admitting Diagnosis: Fall down stairs Open right proximal ulna and radius fractures  Non-displaced fractures through R C5 and C6 lateral masses  Back pain  Hx of seizures Mental delay/Schizophrenia  Hx of Tardive dyskinesia  Discharge Diagnosis Fall down stairs Open right proximal ulna and radius fractures Non-displaced fractures through R C5 and C6 lateral masses Back pain Hx of seizures Mental delay/Schizophrenia  Hx of Tardive dyskinesia Elevated Cr  ABL Anemia Thrombocytopenia  Consultants Orthopedics - Dr. Jeannie Fend Neurosurgery - Dr. Sherwood Gambler   Procedures Dr. Jeannie Fend - 03/13/2019 1.  Irrigation and debridement of open fracture including skin, subcutaneous tissue and bone of the open right ulna fracture 2.  Open reduction and internal fixation of open right ulnar shaft fracture 3.  Right radial head replacement  Hospital Course:  Diamond Bowman is a 53 y.o. female that arrived to [facility] on 10/19 as a non-level trauma after a fall down stairs earlier that morning. The patient was found to have injuries including open right proximal ulna and radius fractures and non-displaced fractures through R C5 and C6 lateral masses. Orthopedics was consulted for the patients open right proximal ulna and radius fractures. She was taken to the OR as above by Orthopedics. She tolerated the procedure well and was transferred back to the floor. Neurosurgery was consulted for CT findings of non-displaced fractures through R C5 and C6 lateral masses. It was felt that with the intact flowing osteophyte from C5-C7, her cervical spine was stable. Flex/Ex films were checked without any signs of instability. Her c-collar was removed and her c-spine was cleared by neurosurgery. No follow up recommended or needed. The patient worked with therapies who recommended [place]. HH and  equipment needed were arranged with the patients caregiver. On 10/22, the patient was voiding well, tolerating diet, working well with therapies, pain well controlled, vital signs stable, and felt stable for discharge home.  Physical Exam: Please see progress note from earlier today.   Allergies as of 03/16/2019   No Known Allergies     Medication List    TAKE these medications   acetaminophen 325 MG tablet Commonly known as: TYLENOL Take 2 tablets (650 mg total) by mouth every 4 (four) hours as needed for mild pain.   calcium-vitamin D 500-200 MG-UNIT tablet Commonly known as: OSCAL WITH D Take 1 tablet by mouth daily.   cyproheptadine 4 MG tablet Commonly known as: PERIACTIN Take 4 mg by mouth at bedtime.   divalproex 500 MG 24 hr tablet Commonly known as: DEPAKOTE ER Take 3 tablets (1,500 mg total) by mouth at bedtime.   docusate sodium 100 MG capsule Commonly known as: COLACE Take 100 mg by mouth daily.   eucerin lotion Apply topically See admin instructions. Apply topically to entire body twice daily   Fosamax 70 MG tablet Generic drug: alendronate Take 70 mg by mouth every Thursday. Take with a full glass of water on an empty stomach.   hydrOXYzine 50 MG tablet Commonly known as: ATARAX/VISTARIL Take 50 mg by mouth See admin instructions. Take one tablet (50 mg) by mouth twice daily - 8am and 2pm   Iron Slow Release 140 (45 Fe) MG Tbcr Generic drug: Ferrous Sulfate Take 1 tablet by mouth daily.   Krill Oil 1000 MG Caps Take 1,000 mg by mouth daily.   levothyroxine 50 MCG tablet Commonly known as: SYNTHROID Take 50 mcg by  mouth daily.   loratadine 10 MG tablet Commonly known as: CLARITIN Take 10 mg by mouth daily.   mirtazapine 15 MG tablet Commonly known as: REMERON Take 15 mg by mouth at bedtime.   multivitamin with minerals Tabs tablet Take 1 tablet by mouth daily.   olanzapine zydis 15 MG disintegrating tablet Commonly known as: ZYPREXA Take  1 tablet (15 mg total) by mouth at bedtime.   oxyCODONE 5 MG immediate release tablet Commonly known as: Oxy IR/ROXICODONE Take 1 tablet (5 mg total) by mouth every 6 (six) hours as needed for breakthrough pain.   traZODone 100 MG tablet Commonly known as: DESYREL Take 300 mg by mouth at bedtime.            Durable Medical Equipment  (From admission, onward)         Start     Ordered   03/15/19 0917  For home use only DME 3 n 1  Once     03/15/19 0919   03/15/19 0917  For home use only DME Hospital bed  Once    Question Answer Comment  Length of Need 6 Months   Patient has (list medical condition): Open right proximal ulna and radius fractures   The above medical condition requires: Patient requires the ability to reposition frequently   Bed type Semi-electric      03/15/19 0919           Follow-up Information    Everardo Beals, NP. Go on 03/31/2019.   Why: @ 10:00am. I would recommend repeat lab work to check your Hgb and kidney function Contact information: [place] [address] [phone]        [facility] Follow up.   Why: Physical and occupational therapy to follow up with you at home.  They will call you for an appointment Contact information: Phone:  [phone]       Verner Mould, MD. Call.   Why: Please call to schedule a follow up appointment.  Contact information: [address] [place] [address] [phone]        [facility]. Call.   Why: As needed Contact information: [address] [phone]          Signed: Alferd Apa, [facility] 03/16/2019, 10:42 AM Pager: [phone]

## 2019-03-16 NOTE — TOC Transition Note (Addendum)
Transition of Care [facility]) - CM/SW Discharge Note   Patient Details  Name: Diamond Bowman MRN: [id number] Date of Birth: [birth year]  [facility] CM/SW Contact:  Ella Bodo, RN Phone Number: 03/16/2019, 10:08 AM   Clinical Narrative: Patient is a 53 year old female who had a fall down several stairs resulting in  displaced, open Monteggia fracture with angulation through the ulna as well as displaced radial neck fracture of RUE.   Pt medically stable for discharge later today. Patient is a 53 year old female who had a fall down several stairs resulting in  displaced, open Monteggia fracture with angulation through the ulna as well as displaced radial neck fracture of RUE.   Pt medically stable for discharge later today.    All ordered DME has been delivered to patient's home,and HH arranged with [place].   PTAR notified for transport at 1:45pm; bedside nurse to call patient's caregiver when they arrive to transport pt home.         Final next level of care: Home w Home Health Services Barriers to Discharge: Barriers Resolved   Patient Goals and CMS Choice   CMS Medicare.gov Compare Post Acute Care list provided to:: Patient Represenative (must comment)(Caregiver Meriel Flavors) Choice offered to / list presented to : [place] / [place]  Discharge Placement                       Discharge Plan and Services   Discharge Planning Services: Follow-up appt scheduled, Other - See comment(Ambulance transport) Post Acute Care Choice: Home Health          DME Arranged: 3-N-1, Hospital bed DME Agency: [employer] Date DME Agency Contacted: 03/15/19 Time DME Agency Contacted: 1000 Representative spoke with at DME Agency: [place]: PT, OT [place] Agency: [place] ([place]) Date [place]: 03/16/19 Time [place]: 1006 Representative spoke with at [place]: [place]  ([place]) Interventions     Readmission Risk Interventions No flowsheet data found.  Reinaldo Raddle, RN, BSN  Trauma/Neuro ICU Case Manager [phone]

## 2019-03-31 DIAGNOSIS — S52279A Monteggia's fracture of unspecified ulna, initial encounter for closed fracture: Secondary | ICD-10-CM

## 2019-03-31 HISTORY — DX: Monteggia's fracture of unspecified ulna, initial encounter for closed fracture: S52.279A

## 2020-02-14 ENCOUNTER — Encounter: Payer: Self-pay | Admitting: Gastroenterology

## 2020-02-27 ENCOUNTER — Telehealth: Payer: Self-pay | Admitting: Oncology

## 2020-02-27 NOTE — Telephone Encounter (Signed)
Received a new hem referral for other specified disorders of white blood cells. Ms. Diamond Bowman has been scheduled to see Dr. Alen Blew on 10/27 at 2pm. Appt date and time has been given to the pt's caregiver.

## 2020-03-11 ENCOUNTER — Telehealth: Payer: Self-pay | Admitting: *Deleted

## 2020-03-11 NOTE — Telephone Encounter (Signed)
Dr.Nandigam,  This patient is scheduled for direct screening colonoscopy. The paper referral from the PCP states she needs screening colonoscopy, patient with low platelet count and anemia. She also has a hx of seizures, mental retardation, schizophrenia, bipolar, CKD stage 3, and heart murmur.  Platelet count=75 per PCP notes. Ok to proceed with colonoscopy as scheduled? Please advise. Thank you, Mauri Temkin pv

## 2020-03-12 NOTE — Telephone Encounter (Signed)
I called and spoke with the patient's care giver. She will check the patient's calendar and call us back to let us know if she could do the [facility] on Friday and PV tomorrow.

## 2020-03-12 NOTE — Telephone Encounter (Signed)
Noted. Will assess patient at time of PV.

## 2020-03-12 NOTE — Telephone Encounter (Signed)
Called the patient and left a message for her to call us back to schedule the EGD with her colonoscopy per Dr.Nandigam's recommendations.

## 2020-03-12 NOTE — Telephone Encounter (Signed)
Okay to proceed with colonoscopy, will also need EGD for evaluation of anemia to exclude upper GI source of blood loss.  If patient is able to follow directions okay to schedule at [facility] but if you feel she needs additional help better to schedule at the hospital, next available appointment.  Thank you

## 2020-03-15 ENCOUNTER — Encounter: Payer: Medicaid Other | Admitting: Gastroenterology

## 2020-03-15 NOTE — Telephone Encounter (Signed)
Patient rescheduled for Dec. 2021.

## 2020-03-20 ENCOUNTER — Telehealth: Payer: Self-pay | Admitting: Oncology

## 2020-03-20 ENCOUNTER — Inpatient Hospital Stay: Payer: Medicaid Other | Attending: Oncology | Admitting: Oncology

## 2020-03-20 NOTE — Telephone Encounter (Signed)
Per staff msg from Dr. Hazeline Junker nurse: Patient did not show for today's appointment. The patient's therapeutic provider stated she is waiting on a consent form from the patient's mom before she can bring her for an appointment.   I cld and lft Ms. Docia Barrier a vm to r/s Diamond Bowman's appt.

## 2020-03-25 ENCOUNTER — Other Ambulatory Visit: Payer: Medicaid Other

## 2020-03-25 DIAGNOSIS — Z20822 Contact with and (suspected) exposure to covid-19: Secondary | ICD-10-CM

## 2020-03-26 LAB — SARS-COV-2, NAA 2 DAY TAT

## 2020-03-26 LAB — NOVEL CORONAVIRUS, NAA: SARS-CoV-2, NAA: NOT DETECTED

## 2020-03-29 ENCOUNTER — Encounter: Payer: Medicaid Other | Admitting: Gastroenterology

## 2020-04-11 ENCOUNTER — Telehealth: Payer: Self-pay | Admitting: Oncology

## 2020-04-11 NOTE — Telephone Encounter (Signed)
Received a call back from the pt's caregiver Docia Barrier to r/s her new hem appt to 12/3 at 2pm to see Dr.Shadad. Aware to arrive 30 minutes early.

## 2020-04-26 ENCOUNTER — Other Ambulatory Visit: Payer: Self-pay

## 2020-04-26 ENCOUNTER — Inpatient Hospital Stay: Payer: Medicaid Other | Attending: Oncology | Admitting: Oncology

## 2020-04-26 VITALS — BP 132/65 | HR 95 | Temp 97.8°F | Resp 19 | Ht <= 58 in | Wt 132.7 lb

## 2020-04-26 DIAGNOSIS — D696 Thrombocytopenia, unspecified: Secondary | ICD-10-CM | POA: Insufficient documentation

## 2020-04-26 DIAGNOSIS — R21 Rash and other nonspecific skin eruption: Secondary | ICD-10-CM | POA: Insufficient documentation

## 2020-04-26 DIAGNOSIS — D649 Anemia, unspecified: Secondary | ICD-10-CM | POA: Insufficient documentation

## 2020-04-26 DIAGNOSIS — Z79899 Other long term (current) drug therapy: Secondary | ICD-10-CM | POA: Insufficient documentation

## 2020-04-26 NOTE — Addendum Note (Signed)
Addended by: Jesse Fall on: 04/26/2020 02:35 PM   Modules accepted: Orders

## 2020-04-26 NOTE — Progress Notes (Signed)
Hematology and Oncology Follow Up Visit  Diamond Bowman [id number] 05-07-1966 54 y.o. 04/26/2020 1:57 PM [place], Verdell Face, NP   Principle Diagnosis: 54 year old woman with anemia and thrombocytopenia diagnosed in August 2016.  I work-up at that time including a bone marrow biopsy did not reveal any clear-cut abnormality.   Prior Therapy: She is status post a bone marrow biopsy done on 01/11/2015 which showed no MDS or infiltrative bone marrow process.  Current therapy: Active surveillance.  Interim History:  Diamond Bowman returns today for repeat evaluation.  She is a pleasant 54 year old woman with you evaluated in the past for abnormal CBC.  At that time her hemoglobin was 8.3 with a platelet count of 50 with normal white cell count and mild monocytosis.  She underwent bone marrow biopsy in August 2016 which did not show any clear-cut infiltrative bone marrow process.  CBC obtained on March 15, 2019 showed a hemoglobin of 8.2 with a blood count of 57.  CBC obtained on 04/22/2020 showed a white cell count of 8.1, hemoglobin 12.3 with a platelet count of 82,000.  Clinically, she does not have any major complaints.  She did develop a rash on her lower extremity and currently using topical cream.   Medications: Updated on review. Current Outpatient Medications  Medication Sig Dispense Refill  . acetaminophen (TYLENOL) 325 MG tablet Take 2 tablets (650 mg total) by mouth every 4 (four) hours as needed for mild pain.    Marland Kitchen alendronate (FOSAMAX) 70 MG tablet Take 70 mg by mouth every Thursday. Take with a full glass of water on an empty stomach.    . calcium-vitamin D (OSCAL WITH D) 500-200 MG-UNIT tablet Take 1 tablet by mouth daily.    . cyproheptadine (PERIACTIN) 4 MG tablet Take 4 mg by mouth at bedtime.    . divalproex (DEPAKOTE ER) 500 MG 24 hr tablet Take 3 tablets (1,500 mg total) by mouth at bedtime.    . docusate sodium (COLACE) 100 MG capsule Take 100 mg by  mouth daily.    . Emollient (EUCERIN) lotion Apply topically See admin instructions. Apply topically to entire body twice daily    . Ferrous Sulfate (IRON SLOW RELEASE) 140 (45 Fe) MG TBCR Take 1 tablet by mouth daily.    . hydrOXYzine (ATARAX/VISTARIL) 50 MG tablet Take 50 mg by mouth See admin instructions. Take one tablet (50 mg) by mouth twice daily - 8am and 2pm    . Krill Oil 1000 MG CAPS Take 1,000 mg by mouth daily.    Marland Kitchen levothyroxine (SYNTHROID, LEVOTHROID) 50 MCG tablet Take 50 mcg by mouth daily.    Marland Kitchen loratadine (CLARITIN) 10 MG tablet Take 10 mg by mouth daily.    . mirtazapine (REMERON) 15 MG tablet Take 15 mg by mouth at bedtime.    . Multiple Vitamin (MULTIVITAMIN WITH MINERALS) TABS tablet Take 1 tablet by mouth daily.    Marland Kitchen olanzapine zydis (ZYPREXA) 15 MG disintegrating tablet Take 1 tablet (15 mg total) by mouth at bedtime.    Marland Kitchen oxyCODONE (OXY IR/ROXICODONE) 5 MG immediate release tablet Take 1 tablet (5 mg total) by mouth every 6 (six) hours as needed for breakthrough pain. 15 tablet 0  . traZODone (DESYREL) 100 MG tablet Take 300 mg by mouth at bedtime.      No current facility-administered medications for this visit.     Allergies: No Known Allergies     Physical Exam: Blood pressure 132/65, pulse 95, temperature 97.8 F (36.6  C), temperature source Tympanic, resp. rate 19, height 4' 9"  (1.448 m), weight 132 lb 11.2 oz (60.2 kg), SpO2 99 %.   ECOG: 3   General appearance: Comfortable appearing without any discomfort Head: Normocephalic without any trauma Oropharynx: Mucous membranes are moist and pink without any thrush or ulcers. Eyes: Pupils are equal and round reactive to light. Lymph nodes: No cervical, supraclavicular, inguinal or axillary lymphadenopathy.   Heart:regular rate and rhythm.  S1 and S2 without leg edema. Lung: Clear without any rhonchi or wheezes.  No dullness to percussion. Abdomin: Soft, nontender, nondistended with good bowel sounds.  No  hepatosplenomegaly. Musculoskeletal: No joint deformity or effusion.  Full range of motion noted. Neurological: No deficits noted on motor, sensory and deep tendon reflex exam. Skin: Mild erythema noted on her left lower extremity.    Lab Results: Lab Results  Component Value Date   WBC 7.1 03/15/2019   HGB 8.2 (L) 03/15/2019   HCT 24.6 (L) 03/15/2019   MCV 96.9 03/15/2019   PLT 57 (L) 03/15/2019     Chemistry      Component Value Date/Time   NA 141 03/15/2019 0240   K 4.7 03/15/2019 0240   CL 105 03/15/2019 0240   CO2 26 03/15/2019 0240   BUN 16 03/15/2019 0240   CREATININE 1.22 (H) 03/15/2019 0240      Component Value Date/Time   CALCIUM 8.0 (L) 03/15/2019 0240   ALKPHOS 45 01/04/2015 0520   AST 29 01/04/2015 0520   ALT 11 (L) 01/04/2015 0520   BILITOT 0.5 01/04/2015 0520        Impression and Plan:  54 year old woman with the:     1. Thrombocytopenia with a platelet count of fluctuates close to 82,000 detected on a CBC in November 2021.  Laboratory data dating back to 2009 showed similar fluctuation with platelet count as low as 61,000 and for the most part she is currently at baseline.  The differential diagnosis was reviewed today.  Her bone marrow biopsy obtained 2016 was also reviewed and discussed.  There is no clear-cut evidence of infiltrative bone marrow process or hematological condition.  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  From a management standpoint, I do not recommend any intervention at this time given the stability and the chronicity of these findings.  She has no active bleeding or indication for treatment or platelet transfusion.  2. Macrocytic anemia fluctuating since 2010 with work-up unrevealing in 2016.  Her most recent hemoglobin is within normal range.  3.  Follow-up: I am happy to see her in the future as needed.   30  minutes were dedicated  to this visit. The time was spent on reviewing laboratory data, discussing treatment options, discussing differential diagnosis and answering questions regarding future plan.   Zola Button, MD 12/3/20211:57 PM

## 2020-04-29 ENCOUNTER — Other Ambulatory Visit: Payer: Self-pay

## 2020-04-29 ENCOUNTER — Ambulatory Visit (AMBULATORY_SURGERY_CENTER): Payer: Self-pay

## 2020-04-29 VITALS — Ht <= 58 in | Wt 132.0 lb

## 2020-04-29 DIAGNOSIS — Z1211 Encounter for screening for malignant neoplasm of colon: Secondary | ICD-10-CM

## 2020-04-29 DIAGNOSIS — D649 Anemia, unspecified: Secondary | ICD-10-CM

## 2020-04-29 MED ORDER — NA SULFATE-K SULFATE-MG SULF 17.5-3.13-1.6 GM/177ML PO SOLN: 1.0000 | Freq: Once | ORAL | 0 refills | Status: AC

## 2020-04-29 NOTE — Progress Notes (Signed)
No allergies to soy or egg Pt is not on blood thinners or diet pills Denies issues with sedation/intubation Denies atrial flutter/fib Denies constipation   Emmi instructions given to pt  Pt is aware of Covid safety and care partner requirements.  Mother Dondra Prader acting on behalf of pt.  Power of attorney/Guardianship  Meriel Flavors, therapeutic provider-pt lives with her.  Needs therapeutic provider present with pt at all times she is awake.

## 2020-05-03 ENCOUNTER — Encounter: Payer: Self-pay | Admitting: Gastroenterology

## 2020-05-03 ENCOUNTER — Other Ambulatory Visit: Payer: Self-pay

## 2020-05-03 ENCOUNTER — Ambulatory Visit (AMBULATORY_SURGERY_CENTER): Payer: Medicaid Other | Admitting: Gastroenterology

## 2020-05-03 VITALS — BP 179/103 | HR 88 | Temp 97.5°F | Resp 22 | Ht <= 58 in | Wt 132.0 lb

## 2020-05-03 DIAGNOSIS — K319 Disease of stomach and duodenum, unspecified: Secondary | ICD-10-CM | POA: Diagnosis not present

## 2020-05-03 DIAGNOSIS — Z1211 Encounter for screening for malignant neoplasm of colon: Secondary | ICD-10-CM

## 2020-05-03 DIAGNOSIS — K2970 Gastritis, unspecified, without bleeding: Secondary | ICD-10-CM | POA: Diagnosis not present

## 2020-05-03 DIAGNOSIS — D649 Anemia, unspecified: Secondary | ICD-10-CM

## 2020-05-03 DIAGNOSIS — D509 Iron deficiency anemia, unspecified: Secondary | ICD-10-CM | POA: Diagnosis not present

## 2020-05-03 DIAGNOSIS — D122 Benign neoplasm of ascending colon: Secondary | ICD-10-CM

## 2020-05-03 DIAGNOSIS — K648 Other hemorrhoids: Secondary | ICD-10-CM

## 2020-05-03 MED ORDER — SODIUM CHLORIDE 0.9 % IV SOLN: 500.0000 mL | Freq: Once | INTRAVENOUS | Status: DC

## 2020-05-03 MED ORDER — FAMOTIDINE 20 MG PO TABS: 20.0000 mg | ORAL_TABLET | Freq: Two times a day (BID) | ORAL | 3 refills | Status: DC

## 2020-05-03 NOTE — Progress Notes (Signed)
VS-CW 

## 2020-05-03 NOTE — Op Note (Signed)
[facility] Patient Name: Diamond Bowman Procedure Date: 05/03/2020 1:10 PM MRN: [id number] Endoscopist: Mauri Pole , MD Age: 54 Referring MD:  Date of Birth: 05/04/1966 Gender: Female Account #: [id number] Procedure:                Upper GI endoscopy Indications:              Suspected upper gastrointestinal bleeding in                            patient with unexplained iron deficiency anemia,                            chronic anemia and thrmobocytopenia Medicines:                Monitored Anesthesia Care Procedure:                Pre-Anesthesia Assessment:                           - Prior to the procedure, a History and Physical                            was performed, and patient medications and                            allergies were reviewed. The patient's tolerance of                            previous anesthesia was also reviewed. The risks                            and benefits of the procedure and the sedation                            options and risks were discussed with the patient.                            All questions were answered, and informed consent                            was obtained. Prior Anticoagulants: The patient has                            taken no previous anticoagulant or antiplatelet                            agents. ASA Grade Assessment: III - A patient with                            severe systemic disease. After reviewing the risks                            and benefits, the patient was deemed in  satisfactory condition to undergo the procedure.                           After obtaining informed consent, the endoscope was                            passed under direct vision. Throughout the                            procedure, the patient's blood pressure, pulse, and                            oxygen saturations were monitored continuously. The                            Endoscope was  introduced through the mouth, and                            advanced to the second part of duodenum. The upper                            GI endoscopy was accomplished without difficulty.                            The patient tolerated the procedure well. Scope In: Scope Out: Findings:                 The Z-line was regular and was found 32 cm from the                            incisors.                           The examined esophagus was normal.                           Patchy mild inflammation characterized by                            congestion (edema), erosions and erythema was found                            in the entire examined stomach. Biopsies were taken                            with a cold forceps for Helicobacter pylori testing.                           The examined duodenum was normal. Complications:            No immediate complications. Estimated Blood Loss:     Estimated blood loss was minimal. Impression:               - Z-line regular, 32 cm from the incisors.                           -  Normal esophagus.                           - Gastritis. Biopsied.                           - Normal examined duodenum. Recommendation:           - Patient has a contact number available for                            emergencies. The signs and symptoms of potential                            delayed complications were discussed with the                            patient. Return to normal activities tomorrow.                            Written discharge instructions were provided to the                            patient.                           - Resume previous diet.                           - Continue present medications.                           - Await pathology results.                           - Use Pepcid (famotidine) 20 mg PO BID X 3 months                            followed by as needed. Mauri Pole, MD 05/03/2020 2:20:34 PM This report has been  signed electronically.

## 2020-05-03 NOTE — Patient Instructions (Signed)
Handouts :  Polyps, irritable bowel syndrome Resume previous diet Continue current medications Start using famotidine 20mg  by mouth twice daily for 3 months Wait pathology results  YOU HAD AN ENDOSCOPIC PROCEDURE TODAY AT [place] [facility]:   Refer to the procedure report that was given to you for any specific questions about what was found during the examination.  If the procedure report does not answer your questions, please call your gastroenterologist to clarify.  If you requested that your care partner not be given the details of your procedure findings, then the procedure report has been included in a sealed envelope for you to review at your convenience later.  YOU SHOULD EXPECT: Some feelings of bloating in the abdomen. Passage of more gas than usual.  Walking can help get rid of the air that was put into your GI tract during the procedure and reduce the bloating. If you had a lower endoscopy (such as a colonoscopy or flexible sigmoidoscopy) you may notice spotting of blood in your stool or on the toilet paper. If you underwent a bowel prep for your procedure, you may not have a normal bowel movement for a few days.  Please Note:  You might notice some irritation and congestion in your nose or some drainage.  This is from the oxygen used during your procedure.  There is no need for concern and it should clear up in a day or so.  SYMPTOMS TO REPORT IMMEDIATELY:   Following lower endoscopy (colonoscopy or flexible sigmoidoscopy):  Excessive amounts of blood in the stool  Significant tenderness or worsening of abdominal pains  Swelling of the abdomen that is new, acute  Fever of 100F or higher   Following upper endoscopy (EGD)  Vomiting of blood or coffee ground material  New chest pain or pain under the shoulder blades  Painful or persistently difficult swallowing  New shortness of breath  Fever of 100F or higher  Black, tarry-looking stools  For urgent or emergent  issues, a gastroenterologist can be reached at any hour by calling [phone]. Do not use MyChart messaging for urgent concerns.   DIET:  We do recommend a small meal at first, but then you may proceed to your regular diet.  Drink plenty of fluids but you should avoid alcoholic beverages for 24 hours.  ACTIVITY:  You should plan to take it easy for the rest of today and you should NOT DRIVE or use heavy machinery until tomorrow (because of the sedation medicines used during the test).    FOLLOW UP: Our staff will call the number listed on your records 48-72 hours following your procedure to check on you and address any questions or concerns that you may have regarding the information given to you following your procedure. If we do not reach you, we will leave a message.  We will attempt to reach you two times.  During this call, we will ask if you have developed any symptoms of COVID 19. If you develop any symptoms (ie: fever, flu-like symptoms, shortness of breath, cough etc.) before then, please call [phone].  If you test positive for Covid 19 in the 2 weeks post procedure, please call and report this information to [place].    If any biopsies were taken you will be contacted by phone or by letter within the next 1-3 weeks.  Please call us at [phone] if you have not heard about the biopsies in 3 weeks.   SIGNATURES/CONFIDENTIALITY: You and/or your care  partner have signed paperwork which will be entered into your electronic medical record.  These signatures attest to the fact that that the information above on your After Visit Summary has been reviewed and is understood.  Full responsibility of the confidentiality of this discharge information lies with you and/or your care-partner.

## 2020-05-03 NOTE — Progress Notes (Signed)
Called to room to assist during endoscopic procedure.  Patient ID and intended procedure confirmed with present staff. Received instructions for my participation in the procedure from the performing physician.  

## 2020-05-03 NOTE — Progress Notes (Signed)
PT taken to PACU. Monitors in place. VSS. Report given to RN. 

## 2020-05-03 NOTE — Op Note (Signed)
[place] Patient Name: Diamond Bowman Procedure Date: 05/03/2020 1:09 PM MRN: [id number] Endoscopist: Mauri Pole , MD Age: 54 Referring MD:  Date of Birth: 1965/11/09 Gender: Female Account #: [id number] Procedure:                Colonoscopy Indications:              Anemia Medicines:                Monitored Anesthesia Care Procedure:                Pre-Anesthesia Assessment:                           - Prior to the procedure, a History and Physical                            was performed, and patient medications and                            allergies were reviewed. The patient's tolerance of                            previous anesthesia was also reviewed. The risks                            and benefits of the procedure and the sedation                            options and risks were discussed with the patient.                            All questions were answered, and informed consent                            was obtained. Prior Anticoagulants: The patient has                            taken no previous anticoagulant or antiplatelet                            agents. ASA Grade Assessment: III - A patient with                            severe systemic disease. After reviewing the risks                            and benefits, the patient was deemed in                            satisfactory condition to undergo the procedure.                           After obtaining informed consent, the colonoscope  was passed under direct vision. Throughout the                            procedure, the patient's blood pressure, pulse, and                            oxygen saturations were monitored continuously. The                            Colonoscope was introduced through the anus and                            advanced to the the cecum, identified by                            appendiceal orifice and ileocecal valve. The                             colonoscopy was performed without difficulty. The                            patient tolerated the procedure well. The quality                            of the bowel preparation was adequate. The                            ileocecal valve, appendiceal orifice, and rectum                            were photographed. Scope In: 1:43:12 PM Scope Out: 2:06:06 PM Scope Withdrawal Time: 0 hours 9 minutes 3 seconds  Total Procedure Duration: 0 hours 22 minutes 54 seconds  Findings:                 A 7 mm polyp was found in the ascending colon. The                            polyp was sessile. The polyp was removed with a                            cold snare. Resection and retrieval were complete.                           Non-bleeding internal hemorrhoids were found during                            retroflexion. The hemorrhoids were small.                           The exam was otherwise without abnormality. Complications:            No immediate complications. Estimated Blood Loss:     Estimated blood loss was minimal. Impression:               -  One 7 mm polyp in the ascending colon, removed                            with a cold snare. Resected and retrieved.                           - Non-bleeding internal hemorrhoids.                           - The examination was otherwise normal. Recommendation:           - Patient has a contact number available for                            emergencies. The signs and symptoms of potential                            delayed complications were discussed with the                            patient. Return to normal activities tomorrow.                            Written discharge instructions were provided to the                            patient.                           - Resume previous diet.                           - Continue present medications.                           - Await pathology results.                           -  Repeat colonoscopy in 5-10 years for surveillance                            based on pathology results. Mauri Pole, MD 05/03/2020 2:16:30 PM This report has been signed electronically.

## 2020-05-06 DIAGNOSIS — D696 Thrombocytopenia, unspecified: Secondary | ICD-10-CM

## 2020-05-06 HISTORY — DX: Thrombocytopenia, unspecified: D69.6

## 2020-05-07 ENCOUNTER — Telehealth: Payer: Self-pay

## 2020-05-07 NOTE — Telephone Encounter (Signed)
  Follow up Call-  Call back number 05/03/2020  Post procedure Call Back phone  # [phone]  Permission to leave phone message Yes  Some recent data might be hidden     Patient questions:  Do you have a fever, pain , or abdominal swelling? No. Pain Score  0 *  Have you tolerated food without any problems? Yes.    Have you been able to return to your normal activities? Yes.    Do you have any questions about your discharge instructions: Diet   No. Medications  No. Follow up visit  No.  Do you have questions or concerns about your Care? No.  Actions: * If pain score is 4 or above: No action needed, pain <4.  1. Have you developed a fever since your procedure? no  2.   Have you had an respiratory symptoms (SOB or cough) since your procedure? no  3.   Have you tested positive for COVID 19 since your procedure no  4.   Have you had any family members/close contacts diagnosed with the COVID 19 since your procedure?  no   If yes to any of these questions please route to Joylene John, RN and Joella Prince, RN

## 2020-05-20 ENCOUNTER — Encounter: Payer: Self-pay | Admitting: Gastroenterology

## 2020-09-25 ENCOUNTER — Telehealth: Payer: Self-pay | Admitting: Gastroenterology

## 2020-09-25 NOTE — Telephone Encounter (Addendum)
Inbound call from [facility] asking that we send notification in writing stating patient no longer takes Pepcid medication.  Please advise.

## 2020-09-26 NOTE — Telephone Encounter (Signed)
Docia Barrier is the person who called stating she is her care taker and she would come to the office to pick up the letter.

## 2020-09-26 NOTE — Telephone Encounter (Signed)
Who are we sending notification to and where?

## 2020-09-26 NOTE — Telephone Encounter (Signed)
Ok there is no documentation that Dr Silverio Decamp has stopped this, It was given to her in 2021 at her EGD procedure.. This has to go to Dr Silverio Decamp to advise ok to stop

## 2020-09-27 NOTE — Telephone Encounter (Signed)
Its ok to stop Pepcid if she no longer needs it and is doing well. Thanks

## 2020-09-30 NOTE — Telephone Encounter (Signed)
Typed letter , they will come to the third floor to pick up

## 2021-11-04 ENCOUNTER — Other Ambulatory Visit (HOSPITAL_COMMUNITY): Payer: Medicaid Other

## 2021-11-04 ENCOUNTER — Inpatient Hospital Stay (HOSPITAL_COMMUNITY)
Admission: EM | Admit: 2021-11-04 | Discharge: 2021-11-14 | DRG: 871 | Disposition: A | Discharge status: Home Health | Payer: Medicaid Other | Attending: Family Medicine | Admitting: Family Medicine

## 2021-11-04 ENCOUNTER — Emergency Department (HOSPITAL_COMMUNITY): Payer: Medicaid Other

## 2021-11-04 ENCOUNTER — Inpatient Hospital Stay (HOSPITAL_COMMUNITY): Payer: Medicaid Other

## 2021-11-04 ENCOUNTER — Encounter (HOSPITAL_COMMUNITY): Payer: Self-pay | Admitting: Emergency Medicine

## 2021-11-04 ENCOUNTER — Other Ambulatory Visit: Payer: Self-pay

## 2021-11-04 DIAGNOSIS — R0602 Shortness of breath: Secondary | ICD-10-CM | POA: Diagnosis present

## 2021-11-04 DIAGNOSIS — E875 Hyperkalemia: Secondary | ICD-10-CM | POA: Diagnosis not present

## 2021-11-04 DIAGNOSIS — Z6831 Body mass index (BMI) 31.0-31.9, adult: Secondary | ICD-10-CM

## 2021-11-04 DIAGNOSIS — I480 Paroxysmal atrial fibrillation: Secondary | ICD-10-CM | POA: Diagnosis present

## 2021-11-04 DIAGNOSIS — R21 Rash and other nonspecific skin eruption: Secondary | ICD-10-CM | POA: Diagnosis not present

## 2021-11-04 DIAGNOSIS — S62660A Nondisplaced fracture of distal phalanx of right index finger, initial encounter for closed fracture: Secondary | ICD-10-CM | POA: Diagnosis present

## 2021-11-04 DIAGNOSIS — L720 Epidermal cyst: Secondary | ICD-10-CM

## 2021-11-04 DIAGNOSIS — D696 Thrombocytopenia, unspecified: Secondary | ICD-10-CM | POA: Diagnosis present

## 2021-11-04 DIAGNOSIS — Z79899 Other long term (current) drug therapy: Secondary | ICD-10-CM | POA: Diagnosis not present

## 2021-11-04 DIAGNOSIS — N179 Acute kidney failure, unspecified: Secondary | ICD-10-CM | POA: Diagnosis not present

## 2021-11-04 DIAGNOSIS — D693 Immune thrombocytopenic purpura: Secondary | ICD-10-CM | POA: Diagnosis present

## 2021-11-04 DIAGNOSIS — E860 Dehydration: Secondary | ICD-10-CM | POA: Diagnosis present

## 2021-11-04 DIAGNOSIS — F209 Schizophrenia, unspecified: Secondary | ICD-10-CM | POA: Diagnosis present

## 2021-11-04 DIAGNOSIS — I471 Supraventricular tachycardia: Secondary | ICD-10-CM | POA: Diagnosis present

## 2021-11-04 DIAGNOSIS — G2401 Drug induced subacute dyskinesia: Secondary | ICD-10-CM | POA: Diagnosis present

## 2021-11-04 DIAGNOSIS — R6520 Severe sepsis without septic shock: Secondary | ICD-10-CM | POA: Diagnosis present

## 2021-11-04 DIAGNOSIS — I4891 Unspecified atrial fibrillation: Secondary | ICD-10-CM

## 2021-11-04 DIAGNOSIS — Z79890 Hormone replacement therapy (postmenopausal): Secondary | ICD-10-CM | POA: Diagnosis not present

## 2021-11-04 DIAGNOSIS — I4892 Unspecified atrial flutter: Secondary | ICD-10-CM | POA: Diagnosis present

## 2021-11-04 DIAGNOSIS — E039 Hypothyroidism, unspecified: Secondary | ICD-10-CM | POA: Diagnosis present

## 2021-11-04 DIAGNOSIS — J189 Pneumonia, unspecified organism: Secondary | ICD-10-CM | POA: Diagnosis present

## 2021-11-04 DIAGNOSIS — G40909 Epilepsy, unspecified, not intractable, without status epilepticus: Secondary | ICD-10-CM | POA: Diagnosis present

## 2021-11-04 DIAGNOSIS — D649 Anemia, unspecified: Secondary | ICD-10-CM | POA: Diagnosis present

## 2021-11-04 DIAGNOSIS — R791 Abnormal coagulation profile: Secondary | ICD-10-CM | POA: Diagnosis present

## 2021-11-04 DIAGNOSIS — F3181 Bipolar II disorder: Secondary | ICD-10-CM | POA: Diagnosis present

## 2021-11-04 DIAGNOSIS — F79 Unspecified intellectual disabilities: Secondary | ICD-10-CM | POA: Diagnosis present

## 2021-11-04 DIAGNOSIS — Z833 Family history of diabetes mellitus: Secondary | ICD-10-CM

## 2021-11-04 DIAGNOSIS — D691 Qualitative platelet defects: Secondary | ICD-10-CM | POA: Diagnosis present

## 2021-11-04 DIAGNOSIS — I878 Other specified disorders of veins: Secondary | ICD-10-CM

## 2021-11-04 DIAGNOSIS — A419 Sepsis, unspecified organism: Secondary | ICD-10-CM | POA: Diagnosis present

## 2021-11-04 DIAGNOSIS — K5900 Constipation, unspecified: Secondary | ICD-10-CM | POA: Diagnosis not present

## 2021-11-04 DIAGNOSIS — E46 Unspecified protein-calorie malnutrition: Secondary | ICD-10-CM | POA: Diagnosis present

## 2021-11-04 DIAGNOSIS — R6250 Unspecified lack of expected normal physiological development in childhood: Secondary | ICD-10-CM | POA: Diagnosis present

## 2021-11-04 DIAGNOSIS — W232XXA Caught, crushed, jammed or pinched between a moving and stationary object, initial encounter: Secondary | ICD-10-CM | POA: Diagnosis present

## 2021-11-04 DIAGNOSIS — I493 Ventricular premature depolarization: Secondary | ICD-10-CM | POA: Diagnosis present

## 2021-11-04 DIAGNOSIS — Z8371 Family history of colonic polyps: Secondary | ICD-10-CM | POA: Diagnosis not present

## 2021-11-04 DIAGNOSIS — B353 Tinea pedis: Secondary | ICD-10-CM

## 2021-11-04 DIAGNOSIS — Z9181 History of falling: Secondary | ICD-10-CM

## 2021-11-04 DIAGNOSIS — Z7901 Long term (current) use of anticoagulants: Secondary | ICD-10-CM

## 2021-11-04 DIAGNOSIS — R569 Unspecified convulsions: Secondary | ICD-10-CM

## 2021-11-04 DIAGNOSIS — S6990XA Unspecified injury of unspecified wrist, hand and finger(s), initial encounter: Secondary | ICD-10-CM | POA: Diagnosis present

## 2021-11-04 DIAGNOSIS — S62610A Displaced fracture of proximal phalanx of right index finger, initial encounter for closed fracture: Secondary | ICD-10-CM | POA: Diagnosis present

## 2021-11-04 DIAGNOSIS — T462X5A Adverse effect of other antidysrhythmic drugs, initial encounter: Secondary | ICD-10-CM | POA: Diagnosis not present

## 2021-11-04 DIAGNOSIS — R296 Repeated falls: Secondary | ICD-10-CM | POA: Diagnosis present

## 2021-11-04 HISTORY — DX: Other specified disorders of veins: I87.8

## 2021-11-04 HISTORY — DX: Tinea pedis: B35.3

## 2021-11-04 LAB — ECHOCARDIOGRAM COMPLETE
AR max vel: 1.93 cm2
AV Area VTI: 1.71 cm2
AV Area mean vel: 1.59 cm2
AV Mean grad: 6 mmHg
AV Peak grad: 9.4 mmHg
Ao pk vel: 1.53 m/s
Area-P 1/2: 4.63 cm2
Height: 54 in
S' Lateral: 2.6 cm
Weight: 2112.89 [oz_av]

## 2021-11-04 LAB — CBC WITH DIFFERENTIAL/PLATELET
Abs Immature Granulocytes: 0.16 10*3/uL — ABNORMAL HIGH (ref 0.00–0.07)
Basophils Absolute: 0 10*3/uL (ref 0.0–0.1)
Basophils Relative: 0 %
Eosinophils Absolute: 0 10*3/uL (ref 0.0–0.5)
Eosinophils Relative: 0 %
HCT: 30.4 % — ABNORMAL LOW (ref 36.0–46.0)
Hemoglobin: 10.1 g/dL — ABNORMAL LOW (ref 12.0–15.0)
Immature Granulocytes: 1 %
Lymphocytes Relative: 8 %
Lymphs Abs: 1 10*3/uL (ref 0.7–4.0)
MCH: 33 pg (ref 26.0–34.0)
MCHC: 33.2 g/dL (ref 30.0–36.0)
MCV: 99.3 fL (ref 80.0–100.0)
Monocytes Absolute: 1.7 10*3/uL — ABNORMAL HIGH (ref 0.1–1.0)
Monocytes Relative: 13 %
Neutro Abs: 9.9 10*3/uL — ABNORMAL HIGH (ref 1.7–7.7)
Neutrophils Relative %: 78 %
Platelets: 51 10*3/uL — ABNORMAL LOW (ref 150–400)
RBC: 3.06 MIL/uL — ABNORMAL LOW (ref 3.87–5.11)
RDW: 14.6 % (ref 11.5–15.5)
WBC: 12.8 10*3/uL — ABNORMAL HIGH (ref 4.0–10.5)
nRBC: 0 % (ref 0.0–0.2)

## 2021-11-04 LAB — COMPREHENSIVE METABOLIC PANEL
ALT: 12 U/L (ref 0–44)
AST: 19 U/L (ref 15–41)
Albumin: 2.3 g/dL — ABNORMAL LOW (ref 3.5–5.0)
Alkaline Phosphatase: 46 U/L (ref 38–126)
Anion gap: 9 (ref 5–15)
BUN: 23 mg/dL — ABNORMAL HIGH (ref 6–20)
CO2: 28 mmol/L (ref 22–32)
Calcium: 8.1 mg/dL — ABNORMAL LOW (ref 8.9–10.3)
Chloride: 101 mmol/L (ref 98–111)
Creatinine, Ser: 1.35 mg/dL — ABNORMAL HIGH (ref 0.44–1.00)
GFR, Estimated: 46 mL/min — ABNORMAL LOW (ref >60)
Glucose, Bld: 122 mg/dL — ABNORMAL HIGH (ref 70–99)
Potassium: 4.2 mmol/L (ref 3.5–5.1)
Sodium: 138 mmol/L (ref 135–145)
Total Bilirubin: 0.7 mg/dL (ref 0.3–1.2)
Total Protein: 5.6 g/dL — ABNORMAL LOW (ref 6.5–8.1)

## 2021-11-04 LAB — URINALYSIS, ROUTINE W REFLEX MICROSCOPIC
Bilirubin Urine: NEGATIVE
Glucose, UA: NEGATIVE mg/dL
Hgb urine dipstick: NEGATIVE
Ketones, ur: 5 mg/dL — AB
Leukocytes,Ua: NEGATIVE
Nitrite: NEGATIVE
Protein, ur: NEGATIVE mg/dL
Specific Gravity, Urine: 1.008 (ref 1.005–1.030)
pH: 6 (ref 5.0–8.0)

## 2021-11-04 LAB — LACTIC ACID, PLASMA: Lactic Acid, Venous: 1.2 mmol/L (ref 0.5–1.9)

## 2021-11-04 LAB — APTT: aPTT: 34 seconds (ref 24–36)

## 2021-11-04 LAB — TSH: TSH: 1.312 u[IU]/mL (ref 0.350–4.500)

## 2021-11-04 LAB — VALPROIC ACID LEVEL: Valproic Acid Lvl: 73 ug/mL (ref 50.0–100.0)

## 2021-11-04 LAB — PROTIME-INR
INR: 1.5 — ABNORMAL HIGH (ref 0.8–1.2)
Prothrombin Time: 18.3 seconds — ABNORMAL HIGH (ref 11.4–15.2)

## 2021-11-04 LAB — PREGNANCY, URINE: Preg Test, Ur: NEGATIVE

## 2021-11-04 LAB — MAGNESIUM: Magnesium: 1.8 mg/dL (ref 1.7–2.4)

## 2021-11-04 LAB — HIV ANTIBODY (ROUTINE TESTING W REFLEX): HIV Screen 4th Generation wRfx: NONREACTIVE

## 2021-11-04 LAB — STREP PNEUMONIAE URINARY ANTIGEN: Strep Pneumo Urinary Antigen: NEGATIVE

## 2021-11-04 LAB — I-STAT BETA HCG BLOOD, ED (MC, WL, AP ONLY): I-stat hCG, quantitative: 17.6 m[IU]/mL — ABNORMAL HIGH (ref <5)

## 2021-11-04 MED ORDER — VERAPAMIL HCL 40 MG PO TABS
40.0000 mg | ORAL_TABLET | Freq: Three times a day (TID) | ORAL | Status: DC
  Administered 2021-11-04 – 2021-11-07 (×9): 40 mg via ORAL
  Filled 2021-11-04 (×10): qty 1

## 2021-11-04 MED ORDER — METOPROLOL TARTRATE 5 MG/5ML IV SOLN
5.0000 mg | INTRAVENOUS | Status: AC
Start: 2021-11-04 — End: 2021-11-04
  Administered 2021-11-04: 5 mg via INTRAVENOUS

## 2021-11-04 MED ORDER — SODIUM CHLORIDE 0.9 % IV SOLN
500.0000 mg | INTRAVENOUS | Status: DC
  Filled 2021-11-04: qty 5

## 2021-11-04 MED ORDER — LACTATED RINGERS IV SOLN: INTRAVENOUS | Status: DC

## 2021-11-04 MED ORDER — MAGNESIUM SULFATE 2 GM/50ML IV SOLN
2.0000 g | Freq: Once | INTRAVENOUS | Status: AC
  Administered 2021-11-04: 2 g via INTRAVENOUS
  Filled 2021-11-04: qty 50

## 2021-11-04 MED ORDER — DILTIAZEM HCL 25 MG/5ML IV SOLN: 10.0000 mg | Freq: Once | INTRAVENOUS | Status: DC

## 2021-11-04 MED ORDER — DIVALPROEX SODIUM ER 500 MG PO TB24
1500.0000 mg | ORAL_TABLET | Freq: Every day | ORAL | Status: DC
  Administered 2021-11-04 – 2021-11-13 (×10): 1500 mg via ORAL
  Filled 2021-11-04 (×11): qty 3

## 2021-11-04 MED ORDER — DERMACERIN EX CREA
1.0000 | TOPICAL_CREAM | Freq: Two times a day (BID) | CUTANEOUS | Status: DC
  Filled 2021-11-04: qty 107

## 2021-11-04 MED ORDER — CETAPHIL MOISTURIZING EX LOTN
TOPICAL_LOTION | Freq: Two times a day (BID) | CUTANEOUS | Status: DC
Start: 2021-11-04 — End: 2021-11-04
  Filled 2021-11-04: qty 473

## 2021-11-04 MED ORDER — LACTATED RINGERS IV BOLUS
1000.0000 mL | Freq: Once | INTRAVENOUS | Status: AC
  Administered 2021-11-04: 1000 mL via INTRAVENOUS

## 2021-11-04 MED ORDER — OLANZAPINE 5 MG PO TBDP
15.0000 mg | ORAL_TABLET | Freq: Every day | ORAL | Status: DC
  Administered 2021-11-04 – 2021-11-13 (×10): 15 mg via ORAL
  Filled 2021-11-04 (×11): qty 3

## 2021-11-04 MED ORDER — METOPROLOL TARTRATE 5 MG/5ML IV SOLN: 5.0000 mg | Freq: Once | INTRAVENOUS | Status: DC

## 2021-11-04 MED ORDER — LEVOTHYROXINE SODIUM 75 MCG PO TABS
75.0000 ug | ORAL_TABLET | Freq: Every day | ORAL | Status: DC
  Administered 2021-11-05 – 2021-11-14 (×10): 75 ug via ORAL
  Filled 2021-11-04 (×10): qty 1

## 2021-11-04 MED ORDER — HYDROXYZINE HCL 25 MG PO TABS
50.0000 mg | ORAL_TABLET | Freq: Two times a day (BID) | ORAL | Status: DC
  Administered 2021-11-04 – 2021-11-09 (×11): 50 mg via ORAL
  Filled 2021-11-04 (×5): qty 2
  Filled 2021-11-04: qty 5
  Filled 2021-11-04: qty 2
  Filled 2021-11-04: qty 5
  Filled 2021-11-04: qty 2
  Filled 2021-11-04 (×2): qty 5
  Filled 2021-11-04: qty 2

## 2021-11-04 MED ORDER — SODIUM CHLORIDE 0.9 % IV SOLN
500.0000 mg | Freq: Once | INTRAVENOUS | Status: AC
  Administered 2021-11-04: 500 mg via INTRAVENOUS
  Filled 2021-11-04: qty 5

## 2021-11-04 MED ORDER — SODIUM CHLORIDE 0.9 % IV SOLN
1.0000 g | INTRAVENOUS | Status: DC
  Administered 2021-11-05: 1 g via INTRAVENOUS
  Filled 2021-11-04: qty 10

## 2021-11-04 MED ORDER — DOCUSATE SODIUM 100 MG PO CAPS
100.0000 mg | ORAL_CAPSULE | Freq: Every day | ORAL | Status: DC
  Administered 2021-11-07 – 2021-11-10 (×4): 100 mg via ORAL
  Filled 2021-11-04 (×6): qty 1

## 2021-11-04 MED ORDER — SODIUM CHLORIDE 0.9 % IV SOLN
1.0000 g | Freq: Once | INTRAVENOUS | Status: AC
  Administered 2021-11-04: 1 g via INTRAVENOUS
  Filled 2021-11-04: qty 10

## 2021-11-04 MED ORDER — TRAZODONE HCL 100 MG PO TABS
300.0000 mg | ORAL_TABLET | Freq: Every day | ORAL | Status: DC
  Administered 2021-11-04 – 2021-11-13 (×10): 300 mg via ORAL
  Filled 2021-11-04 (×10): qty 3

## 2021-11-04 MED ORDER — DILTIAZEM HCL-DEXTROSE 125-5 MG/125ML-% IV SOLN (PREMIX)
5.0000 mg/h | INTRAVENOUS | Status: DC
  Administered 2021-11-04: 5 mg/h via INTRAVENOUS

## 2021-11-04 MED ORDER — METOPROLOL TARTRATE 5 MG/5ML IV SOLN
5.0000 mg | Freq: Once | INTRAVENOUS | Status: AC
  Administered 2021-11-04: 5 mg via INTRAVENOUS
  Filled 2021-11-04: qty 5

## 2021-11-04 MED ORDER — MIRTAZAPINE 15 MG PO TABS
15.0000 mg | ORAL_TABLET | Freq: Every day | ORAL | Status: DC
  Administered 2021-11-04 – 2021-11-13 (×10): 15 mg via ORAL
  Filled 2021-11-04 (×11): qty 1

## 2021-11-04 MED ORDER — HYDROCERIN EX CREA
TOPICAL_CREAM | Freq: Two times a day (BID) | CUTANEOUS | Status: DC
  Administered 2021-11-08 – 2021-11-14 (×2): 1 via TOPICAL
  Filled 2021-11-04 (×4): qty 113

## 2021-11-04 NOTE — ED Triage Notes (Signed)
Pt BIB [facility] due to [facility] and a productive cough for a couple days.  Pt lives at home and has caretaker there everyday.  [facility] noticed rhonchi in all fields with no fever for them.  Pt diaphoretic and hypotensive in the 80's.  Kidney disease.  160m of NS.  Runs of SVT for two seconds and converts right back to SNR.  Pt finger broken on right hand at day program she goes to.  20g Left A/C.

## 2021-11-04 NOTE — H&P (Signed)
[facility] Admission History and Physical Service Pager: [phone]  Patient name: Diamond Bowman Medical record number: [id number] Date of birth: [birth year] Age: 56 y.o. Gender: female  Primary Care Provider: Everardo Beals, NP Consultants: Cardiology, Ortho Code Status: Full  Preferred Emergency Contact: Dondra Prader (mother) [phone]  Chief Complaint: cough and fever  Assessment and Plan: Diamond Bowman is a 56 y.o. female presenting with worsening cough for 1 week. PMH is significant for MDD, Tardive Dyskinesia, CKD 3, Hypothyroidism, Schizophrenia, Seizures, Falls,   Community Acquired Pneumonia meeting sepsis criteria Acute. Stable -Admit to Progressive, Attending Dr McDiarmid -Continue Zithromax 500 mg IV x 2 doses.(06/13-15).  Total 3 day course -Continue Ceftriaxone 1 gm IV x 4 doses (06/13-17) Total 5 days -Follow up blood cultures -Maintain oxygen sats >92% -Maintain MAP >65 -Incentive Spirometer q2h while awake -PT/OT eval in am -CBC in am -Continue IV rehydration  Narrow Complex SVT/frequent PAC's- likely secondary to severe sepsis Acute. Stable -Cardiology consulted, appreciate recommendations -Continue Cardizem gtt -ECHO pending -Repeat ECG now and in am -Monitor for hypotension  -Maintain HR <150 -Maintain MAP >65 -Check Mag -BMP in am  Right distal phalanx # Acute. Stable -Ortho consulted, appreciate recommendations -No surgical intervention.  Keep splint on and follow up with Dr Fredna Dow next week  Seizure disorder Chronic. Stable -Valproic acid level  -Continue home medication Divalproex ER 1500 mg q24h -Seizure precautions  Falls Chronic. Stable -Fall precautions -Orthostatic vital signs when more stable  Hypothyroidism Chronic. Stable -Continue home medication Levothyroxine 23mg daily  Schizophrenia Chronic. Stable -Continue home medication Olanzapine 15 mg ODT qhs, Hydroxyzine 50 mg BID,  Mirtazapine 15 mg qhs and Trazadone '300mg'$  q hs -Delirium precautions -Redirect when necessary  Thrombocytopenia- thought to be related ITP or platelet dysfunction per Heme/Onc (04/26/20) Chronic. Stable -Hold chemical VTE prophylaxis for platelets 51 -SCD's -CBC in am  Intellectual Disability Chronic. Stable -At baseline mentation per therapeutic provider LMeriel Flavors whom patient has lived with for 20 years -Redirect when possible  FEN/GI: NPO until pass bedside swallow, then regular diet. LR at 125/hr Prophylaxis: SCD's  Disposition: Progressive  History of Present Illness:  Diamond OBIEis a 56y.o. female presenting with progressive worsening of cough for 1 week.  History obtained from patient's therapeutic provider, LMeriel Flavors whom she has lived with for the past 20 years.  Reports cough started last week.  Noticed that was worse last night and this morning had seemed like breathing was off.  Reports had been off balance this past weekend and a little of her baseline requiring more assistance with getting to bathroom.  More fatigued lately and appetite had decreased slightly.  Hydrating well. Voiding well.  Denies any fevers, headaches, dizziness, weakness,chest pain, heart palpitations, exertional shortness of breath, difficulty swallowing, choking episodes, nausea, vomiting, diarrhea, or urinary symptoms. No recent sick contacts or recent travel.  Has had COVID vaccinations x 2.  No PNA vaccine.   She was at the daycare centre today when she developed possible hypotension and EMS was called.  In the ED she was tachypneic without hypoxemia, tachycardic, with intermittent nonsustained SVT, HR 180's on ECG, and febrile 100.2.  She was placed on 4L oxygen N/C and weaned to 1L.  Labs significant for elevated WBCs with  shift and Cr, low platelets, Hbg.  Normal TSH and Lactic Acid.  Chest xray showed right bibasilar infiltrate vs atelectasis.  She was given LR 1 L bolus, Zithromax  500 mg IV  and Ceftriaxone 1 gm IV.  Blood cultures were also obtained.  For her SVT, she was given Metoprolol 5 mg IV x2 and then started on Diltiazem drip.  Cardiology was consulted and recommended to continue rate control and likely rhythm would resolve with treatment of acute sepsis.  For her right hand fracture, xray showed a fracture at the base of the proximal second phalanx. Displaced 71m.  Orthopedics was consulted and recommended to continue splint and follow up with Dr KFredna Downext week.  Review Of Systems: Per HPI with the following additions:   Review of Systems  Constitutional:  Positive for fatigue. Negative for appetite change and fever.  Eyes:  Negative for visual disturbance.  Respiratory:  Positive for cough and shortness of breath. Negative for choking, chest tightness and wheezing.   Cardiovascular:  Negative for chest pain, palpitations and leg swelling.  Gastrointestinal:  Negative for abdominal pain, constipation, diarrhea, nausea and vomiting.  Genitourinary:  Negative for decreased urine volume, difficulty urinating, frequency and hematuria.  Skin:  Negative for color change, pallor and rash.  Neurological:  Negative for dizziness, seizures, syncope, weakness and headaches.  Psychiatric/Behavioral:  Negative for behavioral problems and confusion.      Patient Active Problem List   Diagnosis Date Noted   Fall down stairs 03/13/2019   Thrombocytopenia (HCC)    Encephalopathy, metabolic 016/02/9603  Chronic kidney disease, stage 3 ([facility] 01/04/2015   Encephalopathy 01/04/2015   Schizophrenia ([facility] [facility] 12/28/2014   Mental retardation 05/14/2012   Seizure ([facility] 05/14/2012   Bipolar 2 disorder ([facility] 05/14/2012   Fall 05/14/2012   Hypothyroid 05/14/2012   Osteoporosis 05/14/2012    Past Medical History: Past Medical History:  Diagnosis Date   Anemia    Chronic kidney disease    stage 3   Fall    Heart murmur    stable for yrs-from childhood.   Mental retardation     Osteoporosis    Schizophrenia ([facility]    Seizures ([facility]    last one > 5 yrs   Tardive dyskinesia     Past Surgical History: Past Surgical History:  Procedure Laterality Date   I & D EXTREMITY Right 03/13/2019   Procedure: IRRIGATION AND DEBRIDEMENT EXTREMITY;  Surgeon: CVerner Mould MD;  [facility]  Service: Orthopedics;  Laterality: Right;   ORIF ULNAR FRACTURE Right 03/13/2019   Procedure: OPEN REDUCTION INTERNAL FIXATION (ORIF) ULNAR FRACTURE;  Surgeon: CVerner Mould MD;  [facility]  Service: Orthopedics;  Laterality: Right;   RADIAL HEAD ARTHROPLASTY Right 03/13/2019   Procedure: RADIAL HEAD ARTHROPLASTY;  Surgeon: CVerner Mould MD;  [facility]  Service: Orthopedics;  Laterality: Right;    Social History: Social History   Tobacco Use   Smoking status: Never   Smokeless tobacco: Never  Vaping Use   Vaping Use: Never used  Substance Use Topics   Alcohol use: No   Drug use: No   Additional social history:    Family History: Family History  Problem Relation Age of Onset   Osteoarthritis Mother    Colon polyps Mother [phone]  Diabetes Father    Colon cancer Neg Hx    Esophageal cancer Neg Hx    Rectal cancer Neg Hx    Stomach cancer Neg Hx      Allergies and Medications: No Known Allergies No current facility-administered medications on file prior to encounter.   Current Outpatient Medications on File Prior to Encounter  Medication  Sig Dispense Refill   alendronate (FOSAMAX) 70 MG tablet Take 70 mg by mouth every Thursday. Take with a full glass of water on an empty stomach.     cyproheptadine (PERIACTIN) 4 MG tablet Take 4 mg by mouth at bedtime.     divalproex (DEPAKOTE ER) 500 MG 24 hr tablet Take 3 tablets (1,500 mg total) by mouth at bedtime.     docusate sodium (COLACE) 100 MG capsule Take 100 mg by mouth daily.     Emollient (EUCERIN) lotion Apply topically See admin instructions. Apply topically to entire body  twice daily     Ferrous Sulfate 140 (45 Fe) MG TBCR Take 1 tablet by mouth daily.     furosemide (LASIX) 20 MG tablet Take 20 mg by mouth.     GNP CALCIUM 500 +D3 500-15 MG-MCG TABS Take 1 tablet by mouth daily.     hydrOXYzine (VISTARIL) 50 MG capsule Take 50 mg by mouth 2 (two) times daily.     Krill Oil 1000 MG CAPS Take 1,000 mg by mouth daily.     levothyroxine (SYNTHROID) 75 MCG tablet Take 75 mcg by mouth daily.     loratadine (CLARITIN) 10 MG tablet Take 10 mg by mouth daily.     mirtazapine (REMERON) 15 MG tablet Take 15 mg by mouth at bedtime.     Multiple Vitamin (MULTIVITAMIN WITH MINERALS) TABS tablet Take 1 tablet by mouth daily.     olanzapine zydis (ZYPREXA) 15 MG disintegrating tablet Take 1 tablet (15 mg total) by mouth at bedtime.     traZODone (DESYREL) 100 MG tablet Take 300 mg by mouth at bedtime.      famotidine (PEPCID) 20 MG tablet Take 1 tablet (20 mg total) by mouth 2 (two) times daily. (Patient not taking: Reported on 11/04/2021) 60 tablet 3    Objective: BP 111/74   Pulse 89   Temp 100.2 F (37.9 C) (Oral)   Resp (!) 25   Ht '4\' 6"'$  (1.372 m)   Wt 59.9 kg   SpO2 98%   BMI 31.84 kg/m  Exam: General: In no acute distress Eyes: PEERLA ENTM: No pharyengeal erythema Neck: Nontender. No lymphadenopathy Cardiovascular: Irregular rate and rhythm.No murmurs appreciated Respiratory: Diminished breath sounds bilaterally, no wheezing, rhonchi or crackles appreciated Gastrointestinal: Bowel sounds present. No abdominal pain MSK: Upper extremity strength 4/5 bilaterally, Lower extremity strength 4/5 bilaterally  Derm: No rashes noted Neuro: Alert, oriented to person and time, moving all extremities to command Psych: Behavior appropriate. Speech mumbled, answers questions appropriately.   Labs and Imaging: CBC BMET  Recent Labs  Lab 11/04/21 0826  WBC 12.8*  HGB 10.1*  HCT 30.4*  PLT 51*   Recent Labs  Lab 11/04/21 0826  NA 138  K 4.2  CL 101  CO2 28   BUN 23*  CREATININE 1.35*  GLUCOSE 122*  CALCIUM 8.1*     EKG: Narrow complex SVT  DG Hand Complete Right  Result Date: 11/04/2021 CLINICAL DATA:  Rule out fracture. Broken finger from previous injury. EXAM: RIGHT HAND - COMPLETE 3+ VIEW COMPARISON:  None FINDINGS: There is an acute fracture at the base of the proximal phalanx of the index finger, displaced perhaps 1 mm. Fracture extends to the articular surface. No other regional finding. IMPRESSION: Fracture at the base of the proximal phalanx of the index finger with intra-articular extension. Displaced 1 mm. Electronically Signed   By: Nelson Chimes M.D.   On: 11/04/2021 09:03   DG  Chest Port 1 View  Result Date: 11/04/2021 CLINICAL DATA:  Sepsis.  Shortness of breath. EXAM: PORTABLE CHEST 1 VIEW COMPARISON:  Chest CT 03/13/2019 FINDINGS: The cardiac silhouette, mediastinal and hilar contours are within normal limits. Stable mild eventration of the right hemidiaphragm. The right basilar density could reflect an infiltrate or atelectasis. No pleural effusions or pulmonary lesions. IMPRESSION: Right basilar density could reflect infiltrate or atelectasis. Electronically Signed   By: Marijo Sanes M.D.   On: 11/04/2021 09:03     Carollee Leitz, MD 11/04/2021, 10:48 AM PGY-3, [place] Intern pager: [phone], text pages welcome

## 2021-11-04 NOTE — Progress Notes (Addendum)
I spoke with patient's mother Dondra Prader after obtaining the correct phone number from [place], her therapeutic provider.  Correct phone number should be [phone].  I provided updates to the mother and also confirmed that she is a full code.  Mother also told me that patient had a heart surgery for a heart murmur in the distant past but is unsure the exact surgery.

## 2021-11-04 NOTE — Progress Notes (Addendum)
FPTS Interim Progress Note  S: Went to evaluate patient after RN reported hypotension BP 98/43.  Diltiazem drip has been paused at this time.  Patient is sleeping.  I attempted to call patient's mother to verify CODE STATUS.  No answer.  O: BP (!) 94/42   Pulse (!) 39   Temp 100.2 F (37.9 C) (Oral)   Resp (!) 26   Ht '4\' 6"'$  (1.372 m)   Wt 59.9 kg   SpO2 96%   BMI 31.84 kg/m   General: Sleeping, difficult to arouse, snoring, NAD Eyes: PERRL Cardiovascular: Normal rate, regular rhythm, 2+ radial and DP pulses Respiratory: Breathing comfortably on 1 L nasal cannula Extremities: Warm and well perfused Neuro: Difficult to arouse, withdraws to pain, upward going toes on Babinski  On the monitor heart rate is in the 80s, frequent PVCs seen otherwise appears regular.  Oxygen saturation around 95% on 1 L.  A/P: Severe sepsis secondary to pneumonia Neurologically she is fairly difficult to arouse, unclear what her baseline is. She is maintaining adequate oxygen saturation on 1 L nasal cannula. For hypotension, I ordered another 1 L bolus of LR and increased rate from 75 to 125 cc/h.  Discontinue diltiazem drip.   For pneumonia, add MRSA PCR, sputum culture, and urine strep pneumo and Legionella.  If returning into SVT, would attempt vagal maneuvers (such as applying ice cold towel to face) and consider IV adenosine (6 mg, then 12 mg every 1-2 min for up to 2 doses).  If still hypotensive with SVT, will likely need cardioversion.  If going into atrial fibrillation with RVR, may need amiodarone given soft BP.  We will continue to monitor, if not responsive to IV fluids we will plan to reach out to critical care for possible need for vasopressors.  Zola Button, MD 11/04/2021, 2:16 PM PGY-2, [place] [facility] Service pager [phone]

## 2021-11-04 NOTE — ED Provider Notes (Signed)
[facility] EMERGENCY DEPARTMENT Provider Note  CSN: [id number] Arrival date & time: 11/04/21 5400  Chief Complaint(s) Shortness of Breath  HPI Diamond Bowman is a 56 y.o. female with PMH MDD, tardive dyskinesia, CKD 3, hypothyroidism who presents emergency department for evaluation of cough, shortness of breath and altered mental status.  History obtained from staff at the patient states center who states that for the last 3 to 4 days she has had a worsening cough and is off of her baseline.  There was some initial concern by EMS for possible hypotension but after receiving 125 cc of fluid in the field patient arrives with a blood pressure of 120/65.  Caretakers deny vomiting, diarrhea, abdominal pain or other systemic symptoms.  Patient arrives with an irregular heart rate.   Past Medical History Past Medical History:  Diagnosis Date   Anemia    Chronic kidney disease    stage 3   Fall    Heart murmur    stable for yrs-from childhood.   Mental retardation    Osteoporosis    Schizophrenia ([place])    Seizures ([place])    last one > 5 yrs   Tardive dyskinesia    Patient Active Problem List   Diagnosis Date Noted   Fall down stairs 03/13/2019   Thrombocytopenia (HCC)    Encephalopathy, metabolic 86/[birth year]/1950   Chronic kidney disease, stage 3 ([place]) 01/04/2015   Encephalopathy 01/04/2015   Schizophrenia ([place]) 12/28/2014   Mental retardation 05/14/2012   Seizure ([place]) 05/14/2012   Bipolar 2 disorder ([place]) 05/14/2012   Fall 05/14/2012   Hypothyroid 05/14/2012   Osteoporosis 05/14/2012   Home Medication(s) Prior to Admission medications   Medication Sig Start Date End Date Taking? Authorizing Provider  alendronate (FOSAMAX) 70 MG tablet Take 70 mg by mouth every Thursday. Take with a full glass of water on an empty stomach.   Yes [provider]  cyproheptadine (PERIACTIN) 4 MG tablet Take 4 mg by mouth at bedtime.   Yes [provider]   divalproex (DEPAKOTE ER) 500 MG 24 hr tablet Take 3 tablets (1,500 mg total) by mouth at bedtime. 05/15/12  Yes Nita Sells, MD  docusate sodium (COLACE) 100 MG capsule Take 100 mg by mouth daily.   Yes [provider]  Emollient (EUCERIN) lotion Apply topically See admin instructions. Apply topically to entire body twice daily   Yes [provider]  Ferrous Sulfate 140 (45 Fe) MG TBCR Take 1 tablet by mouth daily.   Yes [provider]  furosemide (LASIX) 20 MG tablet Take 20 mg by mouth.   Yes [provider]  GNP CALCIUM 500 +D3 500-15 MG-MCG TABS Take 1 tablet by mouth daily. 10/03/21  Yes [provider]  hydrOXYzine (VISTARIL) 50 MG capsule Take 50 mg by mouth 2 (two) times daily. 07/29/21  Yes [provider]  Javier Docker Oil 1000 MG CAPS Take 1,000 mg by mouth daily.   Yes [provider]  levothyroxine (SYNTHROID) 75 MCG tablet Take 75 mcg by mouth daily. 08/18/21  Yes [provider]  loratadine (CLARITIN) 10 MG tablet Take 10 mg by mouth daily.   Yes [provider]  mirtazapine (REMERON) 15 MG tablet Take 15 mg by mouth at bedtime.   Yes [provider]  Multiple Vitamin (MULTIVITAMIN WITH MINERALS) TABS tablet Take 1 tablet by mouth daily.   Yes [provider]  olanzapine zydis (ZYPREXA) 15 MG disintegrating tablet Take 1 tablet (15 mg  total) by mouth at bedtime. 01/04/15  Yes Debbe Odea, MD  traZODone (DESYREL) 100 MG tablet Take 300 mg by mouth at bedtime.    Yes [provider]  famotidine (PEPCID) 20 MG tablet Take 1 tablet (20 mg total) by mouth 2 (two) times daily. Patient not taking: Reported on 11/04/2021 05/03/20   Mauri Pole, MD                                                                                                                                    Past Surgical History Past Surgical History:  Procedure Laterality Date   I & D EXTREMITY Right  03/13/2019   Procedure: IRRIGATION AND DEBRIDEMENT EXTREMITY;  Surgeon: Verner Mould, MD;  Location: [facility];  Service: Orthopedics;  Laterality: Right;   ORIF ULNAR FRACTURE Right 03/13/2019   Procedure: OPEN REDUCTION INTERNAL FIXATION (ORIF) ULNAR FRACTURE;  Surgeon: Verner Mould, MD;  Location: [facility];  Service: Orthopedics;  Laterality: Right;   RADIAL HEAD ARTHROPLASTY Right 03/13/2019   Procedure: RADIAL HEAD ARTHROPLASTY;  Surgeon: Verner Mould, MD;  Location: [facility];  Service: Orthopedics;  Laterality: Right;   Family History Family History  Problem Relation Age of Onset   Osteoarthritis Mother    Colon polyps Mother 57   Diabetes Father    Colon cancer Neg Hx    Esophageal cancer Neg Hx    Rectal cancer Neg Hx    Stomach cancer Neg Hx     Social History Social History   Tobacco Use   Smoking status: Never   Smokeless tobacco: Never  Vaping Use   Vaping Use: Never used  Substance Use Topics   Alcohol use: No   Drug use: No   Allergies Patient has no known allergies.  Review of Systems Review of Systems  Constitutional:  Positive for fever.  Respiratory:  Positive for cough.     Physical Exam Vital Signs  I have reviewed the triage vital signs BP 111/74   Pulse 89   Temp 100.2 F (37.9 C) (Oral)   Resp (!) 25   Ht '4\' 6"'$  (1.372 m)   Wt 59.9 kg   SpO2 98%   BMI 31.84 kg/m   Physical Exam Vitals and nursing note reviewed.  Constitutional:      General: She is not in acute distress.    Appearance: She is well-developed. She is ill-appearing.  HENT:     Head: Normocephalic and atraumatic.  Eyes:     Conjunctiva/sclera: Conjunctivae normal.  Cardiovascular:     Rate and Rhythm: Tachycardia present. Rhythm irregular.     Heart sounds: No murmur heard. Pulmonary:     Effort: Pulmonary effort is normal. No respiratory distress.     Breath sounds: Normal breath sounds.  Abdominal:     Palpations: Abdomen is soft.      Tenderness: There is no abdominal tenderness.  Musculoskeletal:        General: No swelling.     Cervical back: Neck supple.  Skin:    General: Skin is warm and dry.     Capillary Refill: Capillary refill takes less than 2 seconds.  Neurological:     Mental Status: She is alert. She is disoriented.  Psychiatric:        Mood and Affect: Mood normal.     ED Results and Treatments Labs (all labs ordered are listed, but only abnormal results are displayed) Labs Reviewed  COMPREHENSIVE METABOLIC PANEL - Abnormal; Notable for the following components:      Result Value   Glucose, Bld 122 (*)    BUN 23 (*)    Creatinine, Ser 1.35 (*)    Calcium 8.1 (*)    Total Protein 5.6 (*)    Albumin 2.3 (*)    GFR, Estimated 46 (*)    All other components within normal limits  CBC WITH DIFFERENTIAL/PLATELET - Abnormal; Notable for the following components:   WBC 12.8 (*)    RBC 3.06 (*)    Hemoglobin 10.1 (*)    HCT 30.4 (*)    Platelets 51 (*)    Neutro Abs 9.9 (*)    Monocytes Absolute 1.7 (*)    Abs Immature Granulocytes 0.16 (*)    All other components within normal limits  PROTIME-INR - Abnormal; Notable for the following components:   Prothrombin Time 18.3 (*)    INR 1.5 (*)    All other components within normal limits  I-STAT BETA HCG BLOOD, ED ([facility], [facility], AP ONLY) - Abnormal; Notable for the following components:   I-stat hCG, quantitative 17.6 (*)    All other components within normal limits  CULTURE, BLOOD (ROUTINE X 2)  CULTURE, BLOOD (ROUTINE X 2)  URINE CULTURE  LACTIC ACID, PLASMA  APTT  TSH  LACTIC ACID, PLASMA  URINALYSIS, ROUTINE W REFLEX MICROSCOPIC  PREGNANCY, URINE                                                                                                                          Radiology DG Hand Complete Right  Result Date: 11/04/2021 CLINICAL DATA:  Rule out fracture. Broken finger from previous injury. EXAM: RIGHT HAND - COMPLETE 3+ VIEW COMPARISON:   None FINDINGS: There is an acute fracture at the base of the proximal phalanx of the index finger, displaced perhaps 1 mm. Fracture extends to the articular surface. No other regional finding. IMPRESSION: Fracture at the base of the proximal phalanx of the index finger with intra-articular extension. Displaced 1 mm. Electronically Signed   By: Nelson Chimes M.D.   On: 11/04/2021 09:03   DG Chest Port 1 View  Result Date: 11/04/2021 CLINICAL DATA:  Sepsis.  Shortness of breath. EXAM: PORTABLE CHEST 1 VIEW COMPARISON:  Chest CT 03/13/2019 FINDINGS: The cardiac silhouette, mediastinal and hilar contours are within normal limits. Stable mild eventration of the right hemidiaphragm. The  right basilar density could reflect an infiltrate or atelectasis. No pleural effusions or pulmonary lesions. IMPRESSION: Right basilar density could reflect infiltrate or atelectasis. Electronically Signed   By: Marijo Sanes M.D.   On: 11/04/2021 09:03    Pertinent labs & imaging results that were available during my care of the patient were reviewed by me and considered in my medical decision making (see MDM for details).  Medications Ordered in ED Medications  diltiazem (CARDIZEM) 125 mg in dextrose 5% 125 mL (1 mg/mL) infusion (5 mg/hr Intravenous New Bag/Given 11/04/21 0951)  cefTRIAXone (ROCEPHIN) 1 g in sodium chloride 0.9 % 100 mL IVPB (0 g Intravenous Stopped 11/04/21 0950)  azithromycin (ZITHROMAX) 500 mg in sodium chloride 0.9 % 250 mL IVPB (0 mg Intravenous Stopped 11/04/21 1048)  lactated ringers bolus 1,000 mL (1,000 mLs Intravenous New Bag/Given 11/04/21 0950)  metoprolol tartrate (LOPRESSOR) injection 5 mg (5 mg Intravenous Given 11/04/21 0938)  metoprolol tartrate (LOPRESSOR) injection 5 mg (5 mg Intravenous Given 11/04/21 0942)                                                                                                                                     Procedures .Critical Care  Performed by: Teressa Lower, MD Authorized by: Teressa Lower, MD   Critical care provider statement:    Critical care time (minutes):  30   Critical care was time spent personally by me on the following activities:  Development of treatment plan with patient or surrogate, discussions with consultants, evaluation of patient's response to treatment, examination of patient, ordering and review of laboratory studies, ordering and review of radiographic studies, ordering and performing treatments and interventions, pulse oximetry, re-evaluation of patient's condition and review of old charts   (including critical care time)  Medical Decision Making / ED Course   This patient presents to the ED for concern of cough, fever, altered mental status, rapid heart rate, this involves an extensive number of treatment options, and is a complaint that carries with it a high risk of complications and morbidity.  The differential diagnosis includes pneumonia, PE, CHF exacerbation, new onset CHF, A-fib, a flutter  MDM: Patient seen in the emergency department for multiple complaints as described above.  Physical exam reveals an ill-appearing patient with a irregular tachycardia but is otherwise unremarkable.  Laboratory evaluation with leukocytosis to 12.8, hemoglobin 10.1, platelet count 51 but this is the patient's baseline, BUN 23, creatinine 1.35 which is also near the patient's baseline, INR is elevated to 1.5 which is unusual.  An i-STAT beta-hCG was positive at 17.6 but I suspect this is likely a lab error and a urine pregnancy test will be sent for confirmation.  X-ray of the hand shows an intra-articular nondisplaced proximal phalanx fracture of the second digit on the right and I spoke with Orion Crook of orthopedics who states that this is likely nonsurgical and patient can remain  in her current splint.  Chest x-ray is concerning for pneumonia and patient started on ceftriaxone azithromycin.  The patient had multiple runs  of A-fib with RVR versus a flutter with rates greater than 180 and required initial push doses of Lopressor which did convert to normal sinus rhythm briefly but the patient had another PAC and went back into this rhythm.  Patient then started on a very low-dose diltiazem drip to decrease myocardial instability and I spoke with Dr. Terri Skains of cardiology who will consult on the patient.  Patient then admitted to the internal medicine service.   Additional history obtained: -Additional history obtained from caregivers -External records from outside source obtained and reviewed including: Chart review including previous notes, labs, imaging, consultation notes   Lab Tests: -I ordered, reviewed, and interpreted labs.   The pertinent results include:   Labs Reviewed  COMPREHENSIVE METABOLIC PANEL - Abnormal; Notable for the following components:      Result Value   Glucose, Bld 122 (*)    BUN 23 (*)    Creatinine, Ser 1.35 (*)    Calcium 8.1 (*)    Total Protein 5.6 (*)    Albumin 2.3 (*)    GFR, Estimated 46 (*)    All other components within normal limits  CBC WITH DIFFERENTIAL/PLATELET - Abnormal; Notable for the following components:   WBC 12.8 (*)    RBC 3.06 (*)    Hemoglobin 10.1 (*)    HCT 30.4 (*)    Platelets 51 (*)    Neutro Abs 9.9 (*)    Monocytes Absolute 1.7 (*)    Abs Immature Granulocytes 0.16 (*)    All other components within normal limits  PROTIME-INR - Abnormal; Notable for the following components:   Prothrombin Time 18.3 (*)    INR 1.5 (*)    All other components within normal limits  I-STAT BETA HCG BLOOD, ED ([facility], [facility], [facility] ONLY) - Abnormal; Notable for the following components:   I-stat hCG, quantitative 17.6 (*)    All other components within normal limits  CULTURE, BLOOD (ROUTINE X 2)  CULTURE, BLOOD (ROUTINE X 2)  URINE CULTURE  LACTIC ACID, PLASMA  APTT  TSH  LACTIC ACID, PLASMA  URINALYSIS, ROUTINE W REFLEX MICROSCOPIC  PREGNANCY, URINE      EKG    EKG Interpretation  Date/Time:  Tuesday November 04 2021 08:00:40 EDT Ventricular Rate:  164 PR Interval:  117 QRS Duration: 70 QT Interval:  295 QTC Calculation: 415 R Axis:   47 Text Interpretation: Normal sinus rhythm Atrial tachycardia Confirmed by [place] ([id number]) on 11/04/2021 8:09:17 AM         Imaging Studies ordered: I ordered imaging studies including chest x-ray I independently visualized and interpreted imaging. I agree with the radiologist interpretation   Medicines ordered and prescription drug management: Meds ordered this encounter  Medications   cefTRIAXone (ROCEPHIN) 1 g in sodium chloride 0.9 % 100 mL IVPB    Order Specific Question:   Antibiotic Indication:    Answer:   CAP   azithromycin (ZITHROMAX) 500 mg in sodium chloride 0.9 % 250 mL IVPB   lactated ringers bolus 1,000 mL   DISCONTD: metoprolol tartrate (LOPRESSOR) injection 5 mg   DISCONTD: diltiazem (CARDIZEM) injection 10 mg   metoprolol tartrate (LOPRESSOR) injection 5 mg   diltiazem (CARDIZEM) 125 mg in dextrose 5% 125 mL (1 mg/mL) infusion   metoprolol tartrate (LOPRESSOR) injection 5 mg    -I have reviewed the patients  home medicines and have made adjustments as needed  Critical interventions Lopressor, diltiazem, cardiology consultation, fluid resuscitation, antibiotics  Consultations Obtained: I requested consultation with the cardiologist and hand surgeons,  and discussed lab and imaging findings as well as pertinent plan - they recommend: No intervention on the hand, treat the underlying infection and the heart rate would likely improve   Cardiac Monitoring: The patient was maintained on a cardiac monitor.  I personally viewed and interpreted the cardiac monitored which showed an underlying rhythm of: Normal sinus rhythm with PACs, A-fib/a flutter vs atrial tach  Social Determinants of Health:  Factors impacting patients care include: Mother is guardian and patient is minimally  verbal   Reevaluation: After the interventions noted above, I reevaluated the patient and found that they have :improved  Co morbidities that complicate the patient evaluation  Past Medical History:  Diagnosis Date   Anemia    Chronic kidney disease    stage 3   Fall    Heart murmur    stable for yrs-from childhood.   Mental retardation    Osteoporosis    Schizophrenia ([place])    Seizures ([place])    last one > 5 yrs   Tardive dyskinesia       Dispostion: I considered admission for this patient, and given pneumonia and abnormal heart rate patient will require admission     Final Clinical Impression(s) / ED Diagnoses Final diagnoses:  Community acquired pneumonia, unspecified laterality  Atrial fibrillation, unspecified type [facility])     '@PCDICTATION'$ @    Teressa Lower, MD 11/04/21 1058

## 2021-11-04 NOTE — Consult Note (Signed)
CARDIOLOGY CONSULT NOTE  Patient ID: Diamond Bowman MRN: [id number] DOB/AGE: [birth year] 56 y.o.  Admit date: 11/04/2021 Attending physician: McDiarmid, Blane Ohara, MD Primary Physician:  Everardo Beals, NP Outpatient Cardiologist: NA Inpatient Cardiologist: Rex Kras, DO, [facility]  Reason of consultation: Rhythm assessment Referring physician: Dr. Teressa Lower  Chief complaint: Shortness of breath, cough, fever  HPI:  Diamond Bowman is a 56 y.o. Caucasian female who presents with a chief complaint of " shortness of breath, cough, fever." Her past medical history and cardiovascular risk factors include: Documented history of mental retardation, schizophrenia, tardive dyskinesia, history of falls and fractures, thrombocytopenia, seizures, poor historian.  Patient is a poor historian and unable to carry on a conversation.  No family or friend present at bedside.  History of present illness obtained upon reviewing electronic medical records and discussing care with the ED physician.  Patient was brought in by EMS due to shortness of breath and productive cough for the last several days.  ENT noted rhonchi in all lung fields, diaphoretic, hypotensive with SBP in the 80s.  Patient was noted to have runs of SVT which was converted back to normal sinus rhythm within seconds.  Patient is currently being treated for severe sepsis secondary to pneumonia and cardiology has been consulted for rhythm assessment.  On telemetry patient has episodes predominately normal sinus with frequent PACs, atrial tachycardia, and paroxysmal atrial fibrillation.  ALLERGIES: No Known Allergies  PAST MEDICAL HISTORY: Past Medical History:  Diagnosis Date   Anemia    Bipolar 2 disorder ([place]) 05/14/2012   Chronic kidney disease    stage 3   Chronic kidney disease, stage 3 ([place]) 01/04/2015   Encephalopathy 01/04/2015   Fall    Heart murmur    stable for yrs-from childhood.   Hypothyroidism 05/14/2012    Intellectual disability 05/14/2012   Mental retardation    Monteggia's fracture 03/31/2019   Osteoporosis    Schizophrenia ([place])    Seizures ([place])    last one > 5 yrs   Tardive dyskinesia    Thrombocytopenic disorder ([place]) 05/06/2020   Tinea pedis 11/04/2021   Venous stasis syndrome 11/04/2021    PAST SURGICAL HISTORY: Past Surgical History:  Procedure Laterality Date   I & D EXTREMITY Right 03/13/2019   Procedure: IRRIGATION AND DEBRIDEMENT EXTREMITY;  Surgeon: Verner Mould, MD;  Location: [place];  Service: Orthopedics;  Laterality: Right;   ORIF ULNAR FRACTURE Right 03/13/2019   Procedure: OPEN REDUCTION INTERNAL FIXATION (ORIF) ULNAR FRACTURE;  Surgeon: Verner Mould, MD;  Location: [place];  Service: Orthopedics;  Laterality: Right;   RADIAL HEAD ARTHROPLASTY Right 03/13/2019   Procedure: RADIAL HEAD ARTHROPLASTY;  Surgeon: Verner Mould, MD;  Location: [place];  Service: Orthopedics;  Laterality: Right;    FAMILY HISTORY: The patient's family history includes Colon polyps (age of onset: 39) in her mother; Diabetes in her father; Osteoarthritis in her mother.   SOCIAL HISTORY:  The patient  reports that she has never smoked. She has never used smokeless tobacco. She reports that she does not drink alcohol and does not use drugs.  MEDICATIONS: Current Outpatient Medications  Medication Instructions   alendronate (FOSAMAX) 70 mg, Oral, Every Thu, Take with a full glass of water on an empty stomach.   cyproheptadine (PERIACTIN) 4 mg, Oral, Daily at bedtime   divalproex (DEPAKOTE ER) 1,500 mg, Oral, Daily at bedtime   docusate sodium (COLACE) 100 mg, Oral, Daily   Emollient (EUCERIN) lotion  Topical, See admin instructions, Apply topically to entire body twice daily   famotidine (PEPCID) 20 mg, Oral, 2 times daily   Ferrous Sulfate 140 (45 Fe) MG TBCR 1 tablet, Oral, Daily   furosemide (LASIX) 20 mg, Oral   GNP CALCIUM 500 +D3 500-15 MG-MCG TABS 1 tablet,  Oral, Daily   hydrOXYzine (VISTARIL) 50 mg, Oral, 2 times daily   Krill Oil 1,000 mg, Oral, Daily   levothyroxine (SYNTHROID) 75 mcg, Oral, Daily   loratadine (CLARITIN) 10 mg, Oral, Daily   mirtazapine (REMERON) 15 mg, Oral, Daily at bedtime   Multiple Vitamin (MULTIVITAMIN WITH MINERALS) TABS tablet 1 tablet, Oral, Daily   OLANZapine zydis (ZYPREXA) 15 mg, Oral, Daily at bedtime   traZODone (DESYREL) 300 mg, Oral, Daily at bedtime    REVIEW OF SYSTEMS: Review of Systems  Unable to perform ROS: Other  Likely due to altered mental status unable to participate in a conversation.  Continues to ask for orange juice.  Nurse present at bedside.  PHYSICAL EXAM:    11/04/2021    6:48 PM 11/04/2021    6:00 PM 11/04/2021    5:00 PM  Vitals with BMI  Weight 122 lbs 13 oz    BMI 97.98    Systolic  921 194  Diastolic  60 60  Pulse  85 88     Intake/Output Summary (Last 24 hours) at 11/04/2021 1857 Last data filed at 11/04/2021 1353 Gross per 24 hour  Intake 306.47 ml  Output --  Net 306.47 ml    Net IO Since Admission: 306.47 mL [11/04/21 1857]  CONSTITUTIONAL: Ill-appearing, laying in a fetal position, no acute distress, hemodynamically stable, appearing older than stated age SKIN: Skin is warm and dry. No rash noted. No cyanosis. No pallor. No jaundice HEAD: Normocephalic and atraumatic.  EYES: No scleral icterus MOUTH/THROAT: Moist oral membranes.  NECK: No JVD present. No thyromegaly noted. No carotid bruits  CHEST Normal respiratory effort. No intercostal retractions.  Kyphosis noted posteriorly LUNGS: Decreased breath sounds bases, rhonchi's.  No rales.   CARDIOVASCULAR: Tachycardic, variable S1-S2, no murmurs rubs or gallops appreciated. ABDOMINAL: Soft, nontender, nondistended, positive bowel sounds in all 4 quadrants, no apparent ascites.  EXTREMITIES: No pitting edema, warm to touch.  HEMATOLOGIC: No significant bruising NEUROLOGIC: Awakes to verbal stimuli, not able to  participate in conversation, moves all 4 extremities, disoriented.  RADIOLOGY: ECHOCARDIOGRAM COMPLETE  Result Date: 11/04/2021    ECHOCARDIOGRAM REPORT   Patient Name:   BULAR HICKOK Date of Exam: 11/04/2021 Medical Rec #:  [id number]       Height:       54.0 in Accession #:    [id number]      Weight:       132.1 lb Date of Birth:  [birth year]        BSA:          1.450 m Patient Age:    66 years        BP:           116/47 mmHg Patient Gender: F               HR:           85-171 bpm. Exam Location:  Inpatient Procedure: 2D Echo, Cardiac Doppler and Color Doppler Indications:     Dyspnea  History:         Patient has no prior history of Echocardiogram examinations.  Arrythmias:Tachycardia; Signs/Symptoms:Hypotension. PNA with                  severe sepsis.  Sonographer:     Merrie Roof RDCS Referring Phys:  [id number] MADISON KOMMOR Diagnosing Phys: Rex Kras DO  Sonographer Comments: Suboptimal parasternal window and suboptimal subcostal window. IMPRESSIONS  1. Left ventricular ejection fraction, by estimation, is 55 to 60%. The left ventricle has normal function. The left ventricle has no regional wall motion abnormalities. Left ventricular diastolic parameters were normal.  2. Right ventricular systolic function is normal. The right ventricular size is normal.  3. The mitral valve is grossly normal. No evidence of mitral valve regurgitation. No evidence of mitral stenosis.  4. The aortic valve was not well visualized. Aortic valve regurgitation is not visualized. No aortic stenosis is present.  5. Pulmonic valve regurgitation not well visualized. FINDINGS  Left Ventricle: Left ventricular ejection fraction, by estimation, is 55 to 60%. The left ventricle has normal function. The left ventricle has no regional wall motion abnormalities. The left ventricular internal cavity size was normal in size. There is  no left ventricular hypertrophy. Left ventricular diastolic parameters were normal.  Right Ventricle: The right ventricular size is normal. No increase in right ventricular wall thickness. Right ventricular systolic function is normal. Left Atrium: Left atrial size was normal in size. Right Atrium: Right atrial size was not well visualized. Pericardium: There is no evidence of pericardial effusion. Mitral Valve: The mitral valve is grossly normal. No evidence of mitral valve regurgitation. No evidence of mitral valve stenosis. Tricuspid Valve: The tricuspid valve is grossly normal. Tricuspid valve regurgitation is trivial. No evidence of tricuspid stenosis. Aortic Valve: The aortic valve was not well visualized. Aortic valve regurgitation is not visualized. No aortic stenosis is present. Aortic valve mean gradient measures 6.0 mmHg. Aortic valve peak gradient measures 9.4 mmHg. Aortic valve area, by VTI measures 1.71 cm. Pulmonic Valve: The pulmonic valve was not well visualized. Pulmonic valve regurgitation not well visualized. Aorta: The aortic root was not well visualized. IAS/Shunts: The interatrial septum was not well visualized.  LEFT VENTRICLE PLAX 2D LVIDd:         3.50 cm   Diastology LVIDs:         2.60 cm   LV e' medial:    6.96 cm/s LV PW:         1.00 cm   LV E/e' medial:  14.8 LV IVS:        0.80 cm   LV e' lateral:   7.83 cm/s LVOT diam:     2.00 cm   LV E/e' lateral: 13.2 LV SV:         56 LV SV Index:   39 LVOT Area:     3.14 cm  RIGHT VENTRICLE RV Basal diam:  3.40 cm RV S prime:     13.50 cm/s TAPSE (M-mode): 1.7 cm LEFT ATRIUM             Index        RIGHT ATRIUM           Index LA diam:        2.50 cm 1.72 cm/m   RA Area:     16.40 cm LA Vol (A2C):   40.2 ml 27.73 ml/m  RA Volume:   43.80 ml  30.21 ml/m LA Vol (A4C):   37.1 ml 25.59 ml/m LA Biplane Vol: 40.3 ml 27.79 ml/m  AORTIC VALVE AV Area (Vmax):  1.93 cm AV Area (Vmean):   1.59 cm AV Area (VTI):     1.71 cm AV Vmax:           153.00 cm/s AV Vmean:          117.000 cm/s AV VTI:            0.329 m AV Peak Grad:       9.4 mmHg AV Mean Grad:      6.0 mmHg LVOT Vmax:         93.80 cm/s LVOT Vmean:        59.400 cm/s LVOT VTI:          0.179 m LVOT/AV VTI ratio: 0.54  AORTA Ao Root diam: 2.10 cm MITRAL VALVE MV Area (PHT): 4.63 cm     SHUNTS MV Decel Time: 164 msec     Systemic VTI:  0.18 m MV E velocity: 103.00 cm/s  Systemic Diam: 2.00 cm MV A velocity: 93.10 cm/s MV E/A ratio:  1.11 Bhargav Barbaro DO Electronically signed by Rex Kras DO Signature Date/Time: 11/04/2021/5:51:05 PM    Final    DG Hand Complete Right  Result Date: 11/04/2021 CLINICAL DATA:  Rule out fracture. Broken finger from previous injury. EXAM: RIGHT HAND - COMPLETE 3+ VIEW COMPARISON:  None FINDINGS: There is an acute fracture at the base of the proximal phalanx of the index finger, displaced perhaps 1 mm. Fracture extends to the articular surface. No other regional finding. IMPRESSION: Fracture at the base of the proximal phalanx of the index finger with intra-articular extension. Displaced 1 mm. Electronically Signed   By: Nelson Chimes M.D.   On: 11/04/2021 09:03   DG Chest Port 1 View  Result Date: 11/04/2021 CLINICAL DATA:  Sepsis.  Shortness of breath. EXAM: PORTABLE CHEST 1 VIEW COMPARISON:  Chest CT 03/13/2019 FINDINGS: The cardiac silhouette, mediastinal and hilar contours are within normal limits. Stable mild eventration of the right hemidiaphragm. The right basilar density could reflect an infiltrate or atelectasis. No pleural effusions or pulmonary lesions. IMPRESSION: Right basilar density could reflect infiltrate or atelectasis. Electronically Signed   By: Marijo Sanes M.D.   On: 11/04/2021 09:03    LABORATORY DATA: Lab Results  Component Value Date   WBC 12.8 (H) 11/04/2021   HGB 10.1 (L) 11/04/2021   HCT 30.4 (L) 11/04/2021   MCV 99.3 11/04/2021   PLT 51 (L) 11/04/2021    Recent Labs  Lab 11/04/21 0826  NA 138  K 4.2  CL 101  CO2 28  BUN 23*  CREATININE 1.35*  CALCIUM 8.1*  PROT 5.6*  BILITOT 0.7  ALKPHOS 46   ALT 12  AST 19  GLUCOSE 122*    Lipid Panel  No results found for: "CHOL", "HDL", "LDLCALC", "LDLDIRECT", "TRIG", "CHOLHDL"  BNP (last 3 results) No results for input(s): "BNP" in the last 8760 hours.  HEMOGLOBIN A1C No results found for: "HGBA1C", "MPG"  Cardiac Panel (last 3 results) No results for input(s): "CKTOTAL", "CKMB", "TROPONINIHS", "RELINDX" in the last 72 hours.   TSH Recent Labs    11/04/21 0826  TSH 1.312     CARDIAC DATABASE: EKG: 11/04/2021: Sinus tachycardia, 164 bpm with PACs and PVCs.  11/04/2021 9:31 AM: Narrow complex tachycardia likely atrial tach with frequent PACs, 181 bpm, ST-T changes likely rate related but ischemia cannot be ruled out.  11/04/2021 12:56 PM: Sinus rhythm, 66 bpm, frequent PACs with atrial bigeminy, without underlying injury pattern.   Echocardiogram: 11/04/2021  1.  Left ventricular ejection fraction, by estimation, is 55 to 60%. The  left ventricle has normal function. The left ventricle has no regional  wall motion abnormalities. Left ventricular diastolic parameters were  normal.   2. Right ventricular systolic function is normal. The right ventricular  size is normal.   3. The mitral valve is grossly normal. No evidence of mitral valve  regurgitation. No evidence of mitral stenosis.   4. The aortic valve was not well visualized. Aortic valve regurgitation  is not visualized. No aortic stenosis is present.   5. Pulmonic valve regurgitation not well visualized.            IMPRESSION & RECOMMENDATIONS: Diamond Bowman is a 56 y.o. Caucasian female whose past medical history and cardiovascular risk factors include: Documented history of mental retardation, schizophrenia, tardive dyskinesia, history of falls and fractures, thrombocytopenia, seizures, poor historian.  Impression:  Paroxysmal supraventricular tachycardia -atrial tachycardia, paroxysmal atrial fibrillation, sinus with frequent PACs Premature  ventricular contractions. Severe sepsis likely due to pneumonia (community versus ? aspiration)-defer to primary team Shortness of breath Cough Elevated BNP. Protein calorie malnutrition. Leukocytosis. Anemia Documented history of mental retardation History of schizophrenia History of tardive dyskinesia History of fall/fracture Thrombocytopenia Seizures  Plan:  Cardiology has been asked to evaluate for rhythm assessment.  Her underlying rhythm appears to be sinus with frequent PACs and has salvos of narrow complex tachycardia.  Based on telemetry she has had frequent but brief episodes of paroxysmal atrial fibrillation which spontaneously converts to normal sinus rhythm along with atrial tachycardia.  Patient was started on a diltiazem drip but later held due to soft blood pressures  Based on the information available her CHA2DS2-VASc score is 1 secondary to gender.  If she has prolonged episodes of atrial fibrillation during the hospitalization could consider amiodarone (for rate and rhythm control strategy) and IV Heparin while she is being monitored inpatient.  However, given her clinical status and comorbid conditions I feel that the risk of anticoagulation is greater than the benefits at her current CHA2DS2-VASC score. Will have to discuss [facility] w/ next of kin. Primary team trying to get hold of her mother.   TSH within normal limits.  Echocardiogram personally reviewed-LVEF is preserved, normal diastology, no significant valvular heart disease.  We will start verapamil 40 mg p.o. every 8 hours with holding parameters.  Continue telemetry.  Recommend aggressively managing her underlying severe sepsis per primary team.  I suspect that with treating underlying sepsis her rhythm will improve.   Discussed interpretation of tests and management recommendations with primary team.  Patient's questions and concerns were addressed to her satisfaction. She voices understanding of the  instructions provided during this encounter.   This note was created using a voice recognition software as a result there may be grammatical errors inadvertently enclosed that do not reflect the nature of this encounter. Every attempt is made to correct such errors.  Mechele Claude [facility]  Pager: [phone] Office: [phone] 11/04/2021, 6:57 PM

## 2021-11-04 NOTE — Progress Notes (Signed)
  Echocardiogram 2D Echocardiogram has been performed.  Diamond Bowman F 11/04/2021, 4:26 PM

## 2021-11-04 NOTE — Progress Notes (Signed)
Call from [place] at same time alarms going off. Patient agreed to having some heart racing. Heart rate going from 80s-180s. Other vitals stable with no pain. As starting to page physician, Dr Chauncey Reading called to check after seeing RED MEWS. Dr Chauncey Reading and Dr Marcina Millard came to bedside. Attempt valsalva maneuver with a straw but did not fix problem. EKG performed. Not persisent Afib. Will give scheduled meds as ordered and recontact physician should there continue to be HR/rhythm issues in an hour or so.    11/04/21 2149  Assess: MEWS Score  BP 126/71  MAP (mmHg) 86  Pulse Rate 84  ECG Heart Rate (!) 182  Resp (!) 21  SpO2 95 %  Assess: MEWS Score  MEWS Temp 0  MEWS Systolic 0  MEWS Pulse 3  MEWS RR 1  MEWS LOC 0  MEWS Score 4  MEWS Score Color Red  Assess: if the MEWS score is Yellow or Red  Were vital signs taken at a resting state? Yes  Focused Assessment No change from prior assessment  Does the patient meet 2 or more of the SIRS criteria? Yes  Does the patient have a confirmed or suspected source of infection? Yes  Provider and Rapid Response Notified? Yes  MEWS guidelines implemented *See Row Information* Yes  Treat  Pain Scale 0-10  Pain Score 0  Take Vital Signs  Increase Vital Sign Frequency  Red: Q 1hr X 4 then Q 4hr X 4, if remains red, continue Q 4hrs  Escalate  MEWS: Escalate Red: discuss with charge nurse/RN and provider, consider discussing with RRT  Notify: Charge Nurse/RN  Name of Charge Nurse/RN Notified Neoma Laming  Date Charge Nurse/RN Notified 11/04/21  Time Charge Nurse/RN Notified 2200  Notify: Provider  Provider Name/Title [facility]  Date Provider Notified 11/04/21  Time Provider Notified 1000  Method of Notification Call  Notification Reason Change in status  Provider response At bedside  Date of Provider Response 11/04/21  Time of Provider Response 2205  Document  Patient Outcome Stabilized after interventions  Progress note created (see row info) Yes   Assess: SIRS CRITERIA  SIRS Temperature  0  SIRS Pulse 1  SIRS Respirations  1  SIRS WBC 0  SIRS Score Sum  2

## 2021-11-04 NOTE — Progress Notes (Signed)
Patient ID: Diamond Bowman, female   DOB: 10/31/1965, 56 y.o.   MRN: [id number] I have interviewed and examined the patient.  I have discussed the case with Dr. Volanda Napoleon.   I agree with their documentation and management in their note for today.   Principal Problem:   Community acquired pneumonia               -  Low grade fever, cough, mild elevated WBC, non-definitive infiltrate on CXR Active Problems:   Dehydration   Narrow complex tachycardia (HCC)               - Possible Paroxysmal atrial fibrillation (HCC) - See EKG 12-lead 0931 hrs   Frequent PACs   Intellectual disability   Seizure (HCC)   Thrombocytopenic disorder (HCC)   Plan Rocephin & Azithro IVF volume resuscitation CXR (2V) in morning - see if a more definitive infiltrate develops with hydration Cardiology consultation for opinion about the nature of patient's supraventricular dysrhythmia  Transthoracic echocardiogram to look for structural cardiac issue.       LOS: 0 days                                        For questions or updates, please contact Family Medicine Teaching Service Resident On-call at pager [phone] or  [email or web address] - see pager "[place]"

## 2021-11-05 ENCOUNTER — Inpatient Hospital Stay (HOSPITAL_COMMUNITY): Payer: Medicaid Other

## 2021-11-05 DIAGNOSIS — I4891 Unspecified atrial fibrillation: Secondary | ICD-10-CM | POA: Diagnosis not present

## 2021-11-05 DIAGNOSIS — I471 Supraventricular tachycardia: Secondary | ICD-10-CM

## 2021-11-05 DIAGNOSIS — S62660A Nondisplaced fracture of distal phalanx of right index finger, initial encounter for closed fracture: Secondary | ICD-10-CM | POA: Insufficient documentation

## 2021-11-05 DIAGNOSIS — A419 Sepsis, unspecified organism: Secondary | ICD-10-CM | POA: Diagnosis not present

## 2021-11-05 DIAGNOSIS — J189 Pneumonia, unspecified organism: Secondary | ICD-10-CM | POA: Diagnosis not present

## 2021-11-05 LAB — HEMOGLOBIN A1C
Hgb A1c MFr Bld: 5.2 % (ref 4.8–5.6)
Mean Plasma Glucose: 102.54 mg/dL

## 2021-11-05 LAB — URINE CULTURE: Culture: NO GROWTH

## 2021-11-05 LAB — BASIC METABOLIC PANEL
Anion gap: 5 (ref 5–15)
BUN: 17 mg/dL (ref 6–20)
CO2: 28 mmol/L (ref 22–32)
Calcium: 7.7 mg/dL — ABNORMAL LOW (ref 8.9–10.3)
Chloride: 109 mmol/L (ref 98–111)
Creatinine, Ser: 1.04 mg/dL — ABNORMAL HIGH (ref 0.44–1.00)
GFR, Estimated: >60 mL/min (ref >60)
Glucose, Bld: 131 mg/dL — ABNORMAL HIGH (ref 70–99)
Potassium: 3.8 mmol/L (ref 3.5–5.1)
Sodium: 142 mmol/L (ref 135–145)

## 2021-11-05 LAB — CBC
HCT: 27.9 % — ABNORMAL LOW (ref 36.0–46.0)
Hemoglobin: 9 g/dL — ABNORMAL LOW (ref 12.0–15.0)
MCH: 32.4 pg (ref 26.0–34.0)
MCHC: 32.3 g/dL (ref 30.0–36.0)
MCV: 100.4 fL — ABNORMAL HIGH (ref 80.0–100.0)
Platelets: 38 10*3/uL — ABNORMAL LOW (ref 150–400)
RBC: 2.78 MIL/uL — ABNORMAL LOW (ref 3.87–5.11)
RDW: 14.6 % (ref 11.5–15.5)
WBC: 9.4 10*3/uL (ref 4.0–10.5)
nRBC: 0 % (ref 0.0–0.2)

## 2021-11-05 LAB — MRSA NEXT GEN BY PCR, NASAL: MRSA by PCR Next Gen: NOT DETECTED

## 2021-11-05 MED ORDER — DILTIAZEM HCL-DEXTROSE 125-5 MG/125ML-% IV SOLN (PREMIX)
5.0000 mg/h | INTRAVENOUS | Status: DC
  Administered 2021-11-05 – 2021-11-06 (×2): 5 mg/h via INTRAVENOUS
  Administered 2021-11-06 (×2): 15 mg/h via INTRAVENOUS
  Filled 2021-11-05 (×4): qty 125

## 2021-11-05 MED ORDER — AMIODARONE HCL IN DEXTROSE 360-4.14 MG/200ML-% IV SOLN
30.0000 mg/h | INTRAVENOUS | Status: AC
  Administered 2021-11-06 – 2021-11-07 (×5): 30 mg/h via INTRAVENOUS
  Filled 2021-11-05 (×6): qty 200

## 2021-11-05 MED ORDER — HEPARIN (PORCINE) 25000 UT/250ML-% IV SOLN
1150.0000 [IU]/h | INTRAVENOUS | Status: DC
  Administered 2021-11-05: 700 [IU]/h via INTRAVENOUS
  Administered 2021-11-06: 1000 [IU]/h via INTRAVENOUS
  Filled 2021-11-05 (×2): qty 250

## 2021-11-05 MED ORDER — HEPARIN BOLUS VIA INFUSION
3000.0000 [IU] | Freq: Once | INTRAVENOUS | Status: AC
  Administered 2021-11-05: 3000 [IU] via INTRAVENOUS
  Filled 2021-11-05: qty 3000

## 2021-11-05 MED ORDER — AZITHROMYCIN 250 MG PO TABS
500.0000 mg | ORAL_TABLET | Freq: Every day | ORAL | Status: AC
Start: 2021-11-05 — End: 2021-11-06
  Administered 2021-11-05 – 2021-11-06 (×2): 500 mg via ORAL
  Filled 2021-11-05 (×2): qty 2

## 2021-11-05 MED ORDER — SODIUM CHLORIDE 0.9 % IV SOLN
1.0000 g | INTRAVENOUS | Status: AC
  Administered 2021-11-06 – 2021-11-08 (×3): 1 g via INTRAVENOUS
  Filled 2021-11-05 (×3): qty 10

## 2021-11-05 MED ORDER — AMIODARONE LOAD VIA INFUSION
150.0000 mg | Freq: Once | INTRAVENOUS | Status: AC
  Administered 2021-11-05: 150 mg via INTRAVENOUS
  Filled 2021-11-05: qty 83.34

## 2021-11-05 MED ORDER — AMIODARONE HCL IN DEXTROSE 360-4.14 MG/200ML-% IV SOLN
60.0000 mg/h | INTRAVENOUS | Status: AC
  Administered 2021-11-05 (×2): 60 mg/h via INTRAVENOUS
  Filled 2021-11-05: qty 200

## 2021-11-05 NOTE — Progress Notes (Signed)
Dr. Terri Skains updated with continuing high HR in the 160's to 170's. Pt already on cardizem running at 15 cc/hr. Pt had also been started on heparin at 7 cc/hr with 3000 unit bolus of heparin.

## 2021-11-05 NOTE — Progress Notes (Signed)
Please refer back to the RN message.   Started her on Amio drip for now.   Will follow   Mechele Claude [facility]  Pager: [phone] Office: [phone]

## 2021-11-05 NOTE — Progress Notes (Signed)
Occupational Therapy Treatment Note  Pt seen for splint check. Pt tolerating splint without issues. Recommend nsg remove splint q 2 hrs to check for pressure, then reapply splint if tolerating without issues . If there are any issues with splint, please remove and contact OT dept @ [phone]. Will continue to follow.  HR 180s sitting in bed.    11/05/21 1634  OT Visit Information  Last OT Received On 11/05/21  Assistance Needed +1  History of Present Illness Pt is a 56 y.o. female admitted 11/04/21 with cough, SOB, AMS; of note, pt also with recent injury to R index finger sustaining R proximal phalanx fx. Workup for CAP, narrow complex tachycardia. PMH includes bipolar, schizophrenia, mental retardation, tardive dyskinesia, MDD, CKD 3, hypothyroidism.  Precautions  Precautions Fall;Other (comment)  Precaution Comments watch HR (extreme tachy)  Required Braces or Orthoses Splint/Cast  Splint/Cast R hand via OT  Splint/Cast - Date Prophylactic Dressing Applied (if applicable) 84/66/59 (gel padding on splint @ index MP)  Restrictions  Weight Bearing Restrictions Yes  Other Position/Activity Restrictions per ortho MD - RUE WB through elbow or ulnar side of hand  Pain Assessment  Pain Assessment Faces  Faces Pain Scale 0  General Comments  General comments (skin integrity, edema, etc.) Pt seen for splint check  OT - End of Session  Activity Tolerance Patient tolerated treatment well (HR in the 180s sitting in bed)  Patient left in bed;with call bell/phone within reach;with bed alarm set;with family/visitor present  Nurse Communication Other (comment);Weight bearing status (care of splint)  OT Assessment/Plan  OT Plan Discharge plan remains appropriate;Frequency needs to be updated  OT Visit Diagnosis Unsteadiness on feet (R26.81);Other abnormalities of gait and mobility (R26.89);Muscle weakness (generalized) (M62.81);Other symptoms and signs involving cognitive function  OT Frequency  (ACUTE ONLY) Min 3X/week  Follow Up Recommendations Home health OT  Assistance recommended at discharge Frequent or constant Supervision/Assistance  Patient can return home with the following A little help with walking and/or transfers;A lot of help with bathing/dressing/bathroom;Assistance with cooking/housework;Assistance with feeding;Direct supervision/assist for medications management;Assist for transportation;Help with stairs or ramp for entrance  OT Equipment Tub/shower seat  AM-PAC OT "6 Clicks" Daily Activity Outcome Measure (Version 2)  Help from another person eating meals? 3  Help from another person taking care of personal grooming? 2  Help from another person toileting, which includes using toliet, bedpan, or urinal? 1  Help from another person bathing (including washing, rinsing, drying)? 2  Help from another person to put on and taking off regular upper body clothing? 2  Help from another person to put on and taking off regular lower body clothing? 2  6 Click Score 12  Progressive Mobility  What is the highest level of mobility based on the progressive mobility assessment? Level 5 (Walks with assist in room/hall) - Balance while stepping forward/back and can walk in room with assist - Complete  Activity  (mobility not addressed this session)  OT Goal Progression  Progress towards OT goals Progressing toward goals  Acute Rehab OT Goals  Patient Stated Goal to color  OT Goal Formulation With patient/family  Time For Goal Achievement 11/19/21  Potential to Achieve Goals Good  OT Time Calculation  OT Start Time (ACUTE ONLY) 1514  OT Stop Time (ACUTE ONLY) 1532  OT Time Calculation (min) 18 min  OT General Charges  $OT Visit 1 Visit  OT Treatments  $Orthotics/Prosthetics Check 8-22 mins   Maurie Boettcher, OT/L   Acute OT Clinical Specialist  Acute Rehabilitation Services Pager [phone] Office [phone]

## 2021-11-05 NOTE — Evaluation (Signed)
Physical Therapy Evaluation Patient Details Name: Diamond Bowman MRN: [id number] DOB: Sep 24, 1965 Today's Date: 11/05/2021  History of Present Illness  Pt is a 56 y.o. female admitted 11/04/21 with cough, SOB, AMS; of note, pt also with recent injury to R index finger sustaining R proximal phalanx fx. Workup for CAP, narrow complex tachycardia. PMH includes bipolar, schizophrenia, mental retardation, tardive dyskinesia, MDD, CKD 3, hypothyroidism.   Clinical Impression  Pt presents with an overall decrease in functional mobility secondary to above. PTA, pt lives with caregiver Docia Barrier) and goes to an adult day program M-F; pt ambulatory with handheld assist due to unsteadiness and impulsivity. Today, pt requiring minA +1-2 for mobility; pt impulsive and appears anxious with activity; noted increased WOB and HR up to 224 with activity; RN present, MD aware. Pt would benefit from continued acute PT services to maximize functional mobility and independence prior to d/c with HHPT services.      Recommendations for follow up therapy are one component of a multi-disciplinary discharge planning process, led by the attending physician.  Recommendations may be updated based on patient status, additional functional criteria and insurance authorization.  Follow Up Recommendations Home health PT    Assistance Recommended at Discharge Frequent or constant Supervision/Assistance  Patient can return home with the following  A little help with walking and/or transfers;A lot of help with bathing/dressing/bathroom;Assistance with cooking/housework;Assistance with feeding;Direct supervision/assist for medications management;Direct supervision/assist for financial management;Assist for transportation;Help with stairs or ramp for entrance    Equipment Recommendations None recommended by PT  Recommendations for Other Services       Functional Status Assessment Patient has had a recent decline in their functional  status and demonstrates the ability to make significant improvements in function in a reasonable and predictable amount of time.     Precautions / Restrictions Precautions Precautions: Fall;Other (comment) Precaution Comments: watch HR (extreme tachy) Required Braces or Orthoses: Splint/Cast Splint/Cast: R hand via OT Splint/Cast - Date Prophylactic Dressing Applied (if applicable): 47/42/59 (gel padding on splint @ index MP) Restrictions Weight Bearing Restrictions: Yes Other Position/Activity Restrictions: per ortho MD - RUE WB through elbow or ulnar side of hand      Mobility  Bed Mobility Overal bed mobility: Needs Assistance Bed Mobility: Supine to Sit, Sit to Supine     Supine to sit: Min assist Sit to supine: Supervision   General bed mobility comments: minA to encouragement mobility and steady self once sitting EOB    Transfers Overall transfer level: Needs assistance Equipment used: 2 person hand held assist Transfers: Sit to/from Stand Sit to Stand: Min assist, +2 safety/equipment           General transfer comment: minA for HHA to elevate trunk, pt appears anxious regarding mobility; +2 provided for safety, though physically requiring assist +1    Ambulation/Gait Ambulation/Gait assistance: Min assist, +2 safety/equipment Gait Distance (Feet): 80 Feet Assistive device: 2 person hand held assist, 1 person hand held assist Gait Pattern/deviations: Step-to pattern, Step-through pattern, Wide base of support, Staggering left, Staggering right Gait velocity: Decreased     General Gait Details: unsteady ambulation with intermittent single vs bilateral HHA, pt also reaching to hallway rail for UE support, intermittent minA for stability  Stairs            Wheelchair Mobility    Modified Rankin (Stroke Patients Only)       Balance Overall balance assessment: Needs assistance   Sitting balance-Leahy Scale: [place]     Standing  balance support:  Single extremity supported, Bilateral upper extremity supported, During functional activity Standing balance-Leahy Scale: Poor                               Pertinent Vitals/Pain Pain Assessment Pain Assessment: Faces Faces Pain Scale: Hurts a little bit Pain Location: generalized Pain Descriptors / Indicators: Discomfort Pain Intervention(s): Monitored during session, Limited activity within patient's tolerance    Home Living Family/patient expects to be discharged to:: Private residence Living Arrangements: Other (Comment) (therapy aide) Available Help at Discharge: Personal care attendant;Available 24 hours/day Type of Home: House         Home Layout: Two level;Bed/bath upstairs Home Equipment: Other (comment) (unsure) Additional Comments: attends adult daycare program    Prior Function Prior Level of Function : Needs assist  Cognitive Assist : ADLs (cognitive);Mobility (cognitive) Mobility (Cognitive): Set up cues ADLs (Cognitive): Intermittent cues Physical Assist : Mobility (physical);ADLs (physical) Mobility (physical):  (aide assists duet o unsteadiness) ADLs (physical): Bathing;Dressing;Toileting Mobility Comments: ambulatory with HHA for stability; has had increased instability days leading to admission; family reports pt impulsive with all mobility ADLs Comments: therapy aide assists with ADLs; family reports pt drinks too much water, requires supervision with bathing to make sure pt doesn't drink water from washcloth     Hand Dominance   Dominant Hand: Right    Extremity/Trunk Assessment   Upper Extremity Assessment Upper Extremity Assessment: RUE deficits/detail;Generalized weakness RUE Deficits / Details: bruising @ index and middle fingers; using R hand to color however sister states she is having trouble feeding herself RUE Coordination: decreased fine motor    Lower Extremity Assessment Lower Extremity Assessment: Generalized weakness     Cervical / Trunk Assessment Cervical / Trunk Assessment: Kyphotic  Communication   Communication: Expressive difficulties (at baseline; difficult to understand at times)  Cognition Arousal/Alertness: Awake/alert Behavior During Therapy: Impulsive, Anxious Overall Cognitive Status: History of cognitive impairments - at baseline                                 General Comments: most likely at baseline; requires encouragement to mobilize OOB, motivated by her coloring book        General Comments General comments (skin integrity, edema, etc.): pt's sister and mother present during session; pt's adult day center nursing coordinator(?) present at end of session, reports therapy aide and staff at center will be able to continue providing necessary assist. upon return to bed, pt's HR up to 220s, sustaining 160s-190s; RN present and aware, difficult to determine if pt symptomatic due to cognitive deficits, but pt laying in bed with no apparent distress    Exercises     Assessment/Plan    PT Assessment Patient needs continued PT services  PT Problem List Decreased strength;Decreased activity tolerance;Decreased balance;Decreased mobility;Decreased cognition;Decreased knowledge of use of DME;Decreased safety awareness;Decreased knowledge of precautions;Cardiopulmonary status limiting activity       PT Treatment Interventions DME instruction;Gait training;Functional mobility training;Therapeutic activities;Stair training;Therapeutic exercise;Balance training;Patient/family education    PT Goals (Current goals can be found in the Care Plan section)  Acute Rehab PT Goals Patient Stated Goal: color in new coloring book PT Goal Formulation: With patient Time For Goal Achievement: 11/19/21 Potential to Achieve Goals: Good    Frequency Min 3X/week     Co-evaluation   Reason for Co-Treatment: Necessary to address cognition/behavior during functional activity;To  address  functional/ADL transfers           AM-PAC PT "6 Clicks" Mobility  Outcome Measure Help needed turning from your back to your side while in a flat bed without using bedrails?: A Little Help needed moving from lying on your back to sitting on the side of a flat bed without using bedrails?: A Little Help needed moving to and from a bed to a chair (including a wheelchair)?: A Little Help needed standing up from a chair using your arms (e.g., wheelchair or bedside chair)?: A Little Help needed to walk in hospital room?: A Little Help needed climbing 3-5 steps with a railing? : A Lot 6 Click Score: 17    End of Session Equipment Utilized During Treatment: Gait belt Activity Tolerance: Patient tolerated treatment well Patient left: in bed;with call bell/phone within reach;with bed alarm set;with family/visitor present;with nursing/sitter in room Nurse Communication: Mobility status PT Visit Diagnosis: Other abnormalities of gait and mobility (R26.89);Unsteadiness on feet (R26.81)    Time: 9937-1696 PT Time Calculation (min) (ACUTE ONLY): 25 min   Charges:   PT Evaluation $PT Eval Moderate Complexity: [place], PT, DPT Acute Rehabilitation Services  Pager [phone] Office [phone]  Derry Lory 11/05/2021, 4:41 PM

## 2021-11-05 NOTE — Progress Notes (Signed)
Tachydysrhythmia seems resolved approximately an hour after med administration including new verapimil. Patient has been NSR with HR 70-90s.

## 2021-11-05 NOTE — Consult Note (Addendum)
Reason for Consult:Right index prox phalanx fx Referring Physician: Carollee Leitz Time called: 0730 Time at bedside: Diamond Bowman is an 56 y.o. female.  HPI: Diamond Bowman hurt her right hand on a door last week. She was seen at an UC and diagnosed with an index prox phalanx fx. She was sent to the ED yesterday with cough and SOB. Hand surgery was asked to formally consult today for the fx. She has a significant developmental delay and cannot contribute much to history.  Past Medical History:  Diagnosis Date   Anemia    Bipolar 2 disorder ([place]) 05/14/2012   Chronic kidney disease    stage 3   Chronic kidney disease, stage 3 ([place]) 01/04/2015   Encephalopathy 01/04/2015   Fall    Heart murmur    stable for yrs-from childhood.   Hypothyroidism 05/14/2012   Intellectual disability 05/14/2012   Mental retardation    Monteggia's fracture 03/31/2019   Osteoporosis    Schizophrenia ([place])    Seizures ([place])    last one > 5 yrs   Tardive dyskinesia    Thrombocytopenic disorder ([place]) 05/06/2020   Tinea pedis 11/04/2021   Venous stasis syndrome 11/04/2021    Past Surgical History:  Procedure Laterality Date   I & D EXTREMITY Right 03/13/2019   Procedure: IRRIGATION AND DEBRIDEMENT EXTREMITY;  Surgeon: Verner Mould, MD;  Location: [facility];  Service: Orthopedics;  Laterality: Right;   ORIF ULNAR FRACTURE Right 03/13/2019   Procedure: OPEN REDUCTION INTERNAL FIXATION (ORIF) ULNAR FRACTURE;  Surgeon: Verner Mould, MD;  Location: [facility];  Service: Orthopedics;  Laterality: Right;   RADIAL HEAD ARTHROPLASTY Right 03/13/2019   Procedure: RADIAL HEAD ARTHROPLASTY;  Surgeon: Verner Mould, MD;  Location: [facility];  Service: Orthopedics;  Laterality: Right;    Family History  Problem Relation Age of Onset   Osteoarthritis Mother    Colon polyps Mother 33   Diabetes Father    Colon cancer Neg Hx    Esophageal cancer Neg Hx    Rectal cancer Neg Hx    Stomach cancer Neg  Hx     Social History:  reports that she has never smoked. She has never used smokeless tobacco. She reports that she does not drink alcohol and does not use drugs.  Allergies: No Known Allergies  Medications: I have reviewed the patient's current medications.  Results for orders placed or performed during the hospital encounter of 11/04/21 (from the past 48 hour(s))  Lactic acid, plasma     Status: None   Collection Time: 11/04/21  8:26 AM  Result Value Ref Range   Lactic Acid, Venous 1.2 0.5 - 1.9 mmol/L    Comment: Performed at [facility], [address] [place],  [address]  Comprehensive metabolic panel     Status: Abnormal   Collection Time: 11/04/21  8:26 AM  Result Value Ref Range   Sodium 138 135 - 145 mmol/L   Potassium 4.2 3.5 - 5.1 mmol/L   Chloride 101 98 - 111 mmol/L   CO2 28 22 - 32 mmol/L   Glucose, Bld 122 (H) 70 - 99 mg/dL    Comment: Glucose reference range applies only to samples taken after fasting for at least 8 hours.   BUN 23 (H) 6 - 20 mg/dL   Creatinine, Ser 1.35 (H) 0.44 - 1.00 mg/dL   Calcium 8.1 (L) 8.9 - 10.3 mg/dL   Total Protein 5.6 (L) 6.5 -  8.1 g/dL   Albumin 2.3 (L) 3.5 - 5.0 g/dL   AST 19 15 - 41 U/L   ALT 12 0 - 44 U/L   Alkaline Phosphatase 46 38 - 126 U/L   Total Bilirubin 0.7 0.3 - 1.2 mg/dL   GFR, Estimated 46 (L) >60 mL/min    Comment: (NOTE) Calculated using the CKD-EPI Creatinine Equation (2021)    Anion gap 9 5 - 15    Comment: Performed at [facility] [address], [place], [place] [address]  CBC with Differential     Status: Abnormal   Collection Time: 11/04/21  8:26 AM  Result Value Ref Range   WBC 12.8 (H) 4.0 - 10.5 K/uL   RBC 3.06 (L) 3.87 - 5.11 MIL/uL   Hemoglobin 10.1 (L) 12.0 - 15.0 g/dL   HCT 30.4 (L) 36.0 - 46.0 %   MCV 99.3 80.0 - 100.0 fL   MCH 33.0 26.0 - 34.0 pg   MCHC 33.2 30.0 - 36.0 g/dL   RDW 14.6 11.5 - 15.5 %   Platelets 51 (L) 150 - 400 K/uL    Comment: Immature Platelet  Fraction may be clinically indicated, consider ordering this additional test [id number] REPEATED TO VERIFY PLATELET COUNT CONFIRMED BY SMEAR    nRBC 0.0 0.0 - 0.2 %   Neutrophils Relative % 78 %   Neutro Abs 9.9 (H) 1.7 - 7.7 K/uL   Lymphocytes Relative 8 %   Lymphs Abs 1.0 0.7 - 4.0 K/uL   Monocytes Relative 13 %   Monocytes Absolute 1.7 (H) 0.1 - 1.0 K/uL   Eosinophils Relative 0 %   Eosinophils Absolute 0.0 0.0 - 0.5 K/uL   Basophils Relative 0 %   Basophils Absolute 0.0 0.0 - 0.1 K/uL   Immature Granulocytes 1 %   Abs Immature Granulocytes 0.16 (H) 0.00 - 0.07 K/uL    Comment: Performed at [facility] [address], [place], [place] [address]  Protime-INR     Status: Abnormal   Collection Time: 11/04/21  8:26 AM  Result Value Ref Range   Prothrombin Time 18.3 (H) 11.4 - 15.2 seconds   INR 1.5 (H) 0.8 - 1.2    Comment: (NOTE) INR goal varies based on device and disease states. Performed at [facility], [place] [address], [place], [place] [address]   APTT     Status: None   Collection Time: 11/04/21  8:26 AM  Result Value Ref Range   aPTT 34 24 - 36 seconds    Comment: Performed at [facility] [address] [place], [place] [address]  Blood Culture (routine x 2)     Status: None (Preliminary result)   Collection Time: 11/04/21  8:26 AM   Specimen: BLOOD  Result Value Ref Range   Specimen Description BLOOD LEFT HAND    Special Requests      BOTTLES DRAWN AEROBIC AND ANAEROBIC Blood Culture adequate volume   Culture      NO GROWTH < 24 HOURS Performed at [facility], [place] [address] [place], [place] [address]    Report Status PENDING   Blood Culture (routine x 2)     Status: None (Preliminary result)   Collection Time: 11/04/21  8:26 AM   Specimen: BLOOD  Result Value Ref Range   Specimen Description BLOOD LEFT HAND    Special Requests      BOTTLES DRAWN AEROBIC AND ANAEROBIC Blood Culture adequate volume  Culture      NO GROWTH <  24 HOURS Performed at [facility], [place] [address] [place], [place] [address]    Report Status PENDING   TSH     Status: None   Collection Time: 11/04/21  8:26 AM  Result Value Ref Range   TSH 1.312 0.350 - 4.500 uIU/mL    Comment: Performed by a 3rd Generation assay with a functional sensitivity of <=0.01 uIU/mL. Performed at [facility], [place] [address], [place], [place] [address]   Magnesium     Status: None   Collection Time: 11/04/21  8:26 AM  Result Value Ref Range   Magnesium 1.8 1.7 - 2.4 mg/dL    Comment: Performed at [facility] [address], [place], [place] [address]  I-Stat beta hCG blood, ED     Status: Abnormal   Collection Time: 11/04/21  8:39 AM  Result Value Ref Range   I-stat hCG, quantitative 17.6 (H) <5 mIU/mL   Comment 3            Comment:   GEST. AGE      CONC.  (mIU/mL)   <=1 WEEK        5 - 50     2 WEEKS       50 - 500     3 WEEKS       100 - 10,000     4 WEEKS     1,000 - 30,000        FEMALE AND NON-PREGNANT FEMALE:     LESS THAN 5 mIU/mL   Urinalysis, Routine w reflex microscopic Urine, In & Out Cath     Status: Abnormal   Collection Time: 11/04/21 11:12 AM  Result Value Ref Range   Color, Urine YELLOW YELLOW   APPearance CLEAR CLEAR   Specific Gravity, Urine 1.008 1.005 - 1.030   pH 6.0 5.0 - 8.0   Glucose, UA NEGATIVE NEGATIVE mg/dL   Hgb urine dipstick NEGATIVE NEGATIVE   Bilirubin Urine NEGATIVE NEGATIVE   Ketones, ur 5 (A) NEGATIVE mg/dL   Protein, ur NEGATIVE NEGATIVE mg/dL   Nitrite NEGATIVE NEGATIVE   Leukocytes,Ua NEGATIVE NEGATIVE    Comment: Performed at [facility], [place] [address], [place], [place] [address]  Pregnancy, urine     Status: None   Collection Time: 11/04/21 11:13 AM  Result Value Ref Range   Preg Test, Ur NEGATIVE NEGATIVE    Comment:        THE SENSITIVITY OF THIS METHODOLOGY IS >20 mIU/mL. Performed at [facility], [place] [address] [place], [place] [address]   HIV Antibody  (routine testing w rflx)     Status: None   Collection Time: 11/04/21 12:54 PM  Result Value Ref Range   HIV Screen 4th Generation wRfx Non Reactive Non Reactive    Comment: Performed at [facility], [place] [address], [place], [place] [address]  Valproic acid level     Status: None   Collection Time: 11/04/21 12:54 PM  Result Value Ref Range   Valproic Acid Lvl 73 50.0 - 100.0 ug/mL    Comment: Performed at [facility] [address] [place], [place] [address]  Strep pneumoniae urinary antigen     Status: None   Collection Time: 11/04/21  2:16 PM  Result Value Ref Range   Strep Pneumo Urinary Antigen NEGATIVE NEGATIVE    Comment:  Infection due to S. pneumoniae cannot be absolutely ruled out since the antigen present may be below the detection limit of the test. Performed at [facility], [address] [place], [place] [address]   Basic metabolic panel     Status: Abnormal   Collection Time: 11/05/21  1:11 AM  Result Value Ref Range   Sodium 142 135 - 145 mmol/L   Potassium 3.8 3.5 - 5.1 mmol/L   Chloride 109 98 - 111 mmol/L   CO2 28 22 - 32 mmol/L   Glucose, Bld 131 (H) 70 - 99 mg/dL    Comment: Glucose reference range applies only to samples taken after fasting for at least 8 hours.   BUN 17 6 - 20 mg/dL   Creatinine, Ser 1.04 (H) 0.44 - 1.00 mg/dL   Calcium 7.7 (L) 8.9 - 10.3 mg/dL   GFR, Estimated >60 >60 mL/min    Comment: (NOTE) Calculated using the CKD-EPI Creatinine Equation (2021)    Anion gap 5 5 - 15    Comment: Performed at [facility] [address], [place], [place] [address]  CBC     Status: Abnormal   Collection Time: 11/05/21  1:11 AM  Result Value Ref Range   WBC 9.4 4.0 - 10.5 K/uL   RBC 2.78 (L) 3.87 - 5.11 MIL/uL   Hemoglobin 9.0 (L) 12.0 - 15.0 g/dL   HCT 27.9 (L) 36.0 - 46.0 %   MCV 100.4 (H) 80.0 - 100.0 fL   MCH 32.4 26.0 - 34.0 pg   MCHC 32.3 30.0 - 36.0 g/dL   RDW 14.6 11.5 - 15.5 %   Platelets 38 (L)  150 - 400 K/uL    Comment: Immature Platelet Fraction may be clinically indicated, consider ordering this additional test [id number] CONSISTENT WITH PREVIOUS RESULT REPEATED TO VERIFY    nRBC 0.0 0.0 - 0.2 %    Comment: Performed at [facility], [place] [address], [place], [place] [address]  Hemoglobin A1c     Status: None   Collection Time: 11/05/21  1:11 AM  Result Value Ref Range   Hgb A1c MFr Bld 5.2 4.8 - 5.6 %    Comment: (NOTE) Pre diabetes:          5.7%-6.4%  Diabetes:              >6.4%  Glycemic control for   <7.0% adults with diabetes    Mean Plasma Glucose 102.54 mg/dL    Comment: Performed at [facility] [address] [place], [place] [address]  MRSA Next Gen by PCR, Nasal     Status: None   Collection Time: 11/05/21  5:10 AM  Result Value Ref Range   MRSA by PCR Next Gen NOT DETECTED NOT DETECTED    Comment: (NOTE) The GeneXpert MRSA Assay (FDA approved for NASAL specimens only), is one component of a comprehensive MRSA colonization surveillance program. It is not intended to diagnose MRSA infection nor to guide or monitor treatment for MRSA infections. Test performance is not FDA approved in patients less than 61 years old. Performed at [facility], [place] [address], [place], [place] [address]     DG Chest 2 View  Result Date: 11/05/2021 CLINICAL DATA:  Community-acquired pneumonia. EXAM: CHEST - 2 VIEW COMPARISON:  Portable chest yesterday at 8:35 a.m. FINDINGS: AP Lat chest at 4:39 a.m., 11/05/2021. The patient is obliquely rotated to the right. There is increased opacity in the  left infrahilar area which could be atelectasis or a small infiltrate. It is not well localized on the lateral view. No pleural effusion is seen. The remaining lungs clear with COPD. There is aortic atherosclerosis with stable mediastinum. The cardiac size is normal. Thoracic spondylosis and bridging enthesopathy. IMPRESSION: Left infrahilar opacity which could be  atelectasis or small developing pneumonia. COPD. Rotation to the right limits evaluation of the right lower lung field. Electronically Signed   By: Telford Nab M.D.   On: 11/05/2021 06:34   ECHOCARDIOGRAM COMPLETE  Result Date: 11/04/2021    ECHOCARDIOGRAM REPORT   Patient Name:   GRACELYNN BIRCHER Date of Exam: 11/04/2021 Medical Rec #:  [id number]       Height:       54.0 in Accession #:    [id number]      Weight:       132.1 lb Date of Birth:  01/03/1966        BSA:          1.450 m Patient Age:    55 years        BP:           116/47 mmHg Patient Gender: F               HR:           85-171 bpm. Exam Location:  Inpatient Procedure: 2D Echo, Cardiac Doppler and Color Doppler Indications:     Dyspnea  History:         Patient has no prior history of Echocardiogram examinations.                  Arrythmias:Tachycardia; Signs/Symptoms:Hypotension. PNA with                  severe sepsis.  Sonographer:     Merrie Roof RDCS Referring Phys:  [id number] MADISON KOMMOR Diagnosing Phys: Rex Kras DO  Sonographer Comments: Suboptimal parasternal window and suboptimal subcostal window. IMPRESSIONS  1. Left ventricular ejection fraction, by estimation, is 55 to 60%. The left ventricle has normal function. The left ventricle has no regional wall motion abnormalities. Left ventricular diastolic parameters were normal.  2. Right ventricular systolic function is normal. The right ventricular size is normal.  3. The mitral valve is grossly normal. No evidence of mitral valve regurgitation. No evidence of mitral stenosis.  4. The aortic valve was not well visualized. Aortic valve regurgitation is not visualized. No aortic stenosis is present.  5. Pulmonic valve regurgitation not well visualized. FINDINGS  Left Ventricle: Left ventricular ejection fraction, by estimation, is 55 to 60%. The left ventricle has normal function. The left ventricle has no regional wall motion abnormalities. The left ventricular internal cavity size was  normal in size. There is  no left ventricular hypertrophy. Left ventricular diastolic parameters were normal. Right Ventricle: The right ventricular size is normal. No increase in right ventricular wall thickness. Right ventricular systolic function is normal. Left Atrium: Left atrial size was normal in size. Right Atrium: Right atrial size was not well visualized. Pericardium: There is no evidence of pericardial effusion. Mitral Valve: The mitral valve is grossly normal. No evidence of mitral valve regurgitation. No evidence of mitral valve stenosis. Tricuspid Valve: The tricuspid valve is grossly normal. Tricuspid valve regurgitation is trivial. No evidence of tricuspid stenosis. Aortic Valve: The aortic valve was not well visualized. Aortic valve regurgitation is not visualized. No aortic stenosis is present. Aortic valve mean gradient measures 6.0  mmHg. Aortic valve peak gradient measures 9.4 mmHg. Aortic valve area, by VTI measures 1.71 cm. Pulmonic Valve: The pulmonic valve was not well visualized. Pulmonic valve regurgitation not well visualized. Aorta: The aortic root was not well visualized. IAS/Shunts: The interatrial septum was not well visualized.  LEFT VENTRICLE PLAX 2D LVIDd:         3.50 cm   Diastology LVIDs:         2.60 cm   LV e' medial:    6.96 cm/s LV PW:         1.00 cm   LV E/e' medial:  14.8 LV IVS:        0.80 cm   LV e' lateral:   7.83 cm/s LVOT diam:     2.00 cm   LV E/e' lateral: 13.2 LV SV:         56 LV SV Index:   39 LVOT Area:     3.14 cm  RIGHT VENTRICLE RV Basal diam:  3.40 cm RV S prime:     13.50 cm/s TAPSE (M-mode): 1.7 cm LEFT ATRIUM             Index        RIGHT ATRIUM           Index LA diam:        2.50 cm 1.72 cm/m   RA Area:     16.40 cm LA Vol (A2C):   40.2 ml 27.73 ml/m  RA Volume:   43.80 ml  30.21 ml/m LA Vol (A4C):   37.1 ml 25.59 ml/m LA Biplane Vol: 40.3 ml 27.79 ml/m  AORTIC VALVE AV Area (Vmax):    1.93 cm AV Area (Vmean):   1.59 cm AV Area (VTI):      1.71 cm AV Vmax:           153.00 cm/s AV Vmean:          117.000 cm/s AV VTI:            0.329 m AV Peak Grad:      9.4 mmHg AV Mean Grad:      6.0 mmHg LVOT Vmax:         93.80 cm/s LVOT Vmean:        59.400 cm/s LVOT VTI:          0.179 m LVOT/AV VTI ratio: 0.54  AORTA Ao Root diam: 2.10 cm MITRAL VALVE MV Area (PHT): 4.63 cm     SHUNTS MV Decel Time: 164 msec     Systemic VTI:  0.18 m MV E velocity: 103.00 cm/s  Systemic Diam: 2.00 cm MV A velocity: 93.10 cm/s MV E/A ratio:  1.11 Sunit Tolia DO Electronically signed by Rex Kras DO Signature Date/Time: 11/04/2021/5:51:05 PM    Final    DG Hand Complete Right  Result Date: 11/04/2021 CLINICAL DATA:  Rule out fracture. Broken finger from previous injury. EXAM: RIGHT HAND - COMPLETE 3+ VIEW COMPARISON:  None FINDINGS: There is an acute fracture at the base of the proximal phalanx of the index finger, displaced perhaps 1 mm. Fracture extends to the articular surface. No other regional finding. IMPRESSION: Fracture at the base of the proximal phalanx of the index finger with intra-articular extension. Displaced 1 mm. Electronically Signed   By: Nelson Chimes M.D.   On: 11/04/2021 09:03   DG Chest Port 1 View  Result Date: 11/04/2021 CLINICAL DATA:  Sepsis.  Shortness of breath. EXAM: PORTABLE CHEST 1 VIEW  COMPARISON:  Chest CT 03/13/2019 FINDINGS: The cardiac silhouette, mediastinal and hilar contours are within normal limits. Stable mild eventration of the right hemidiaphragm. The right basilar density could reflect an infiltrate or atelectasis. No pleural effusions or pulmonary lesions. IMPRESSION: Right basilar density could reflect infiltrate or atelectasis. Electronically Signed   By: Marijo Sanes M.D.   On: 11/04/2021 09:03    Review of Systems  Unable to perform ROS: Other   Blood pressure (!) 116/46, pulse 91, temperature 98.6 F (37 C), temperature source Oral, resp. rate 18, height '4\' 6"'$  (1.372 m), weight 55.7 kg, SpO2 97 %. Physical  Exam Constitutional:      General: She is not in acute distress.    Appearance: She is well-developed. She is not diaphoretic.  HENT:     Head: Normocephalic and atraumatic.  Eyes:     General: No scleral icterus.       Right eye: No discharge.        Left eye: No discharge.     Conjunctiva/sclera: Conjunctivae normal.  Cardiovascular:     Rate and Rhythm: Normal rate and regular rhythm.  Pulmonary:     Effort: Pulmonary effort is normal. No respiratory distress.  Musculoskeletal:     Cervical back: Normal range of motion.     Comments: Right shoulder, elbow, wrist, digits- no skin wounds, mild TTP index P1, no instability, no blocks to motion  Sens  Ax/R/M/U intact  Mot   Ax/ R/ PIN/ M/ AIN/ U intact  Rad 2+  Skin:    General: Skin is warm and dry.  Neurological:     Mental Status: She is alert.  Psychiatric:        Mood and Affect: Mood normal.        Behavior: Behavior normal.     Assessment/Plan: Right index prox phalanx fx -- Plan non-operative management with splint. F/u with Dr. Fredna Dow next week.    Lisette Abu, PA-C Orthopedic Surgery [phone] 11/05/2021, 9:19 AM   Addendum (11/11/21):  Patient seen and examined.  Agree with above.  History: Caretaker present in the room and provides history.  States approximately 10 days ago right index finger slammed in door causing injury.  Patient was admitted to the hospital after she had seen her primary care for this and had been recommended to follow-up with orthopedics.  Patient is not contributory to history. Exam: Right hand: Intact sensation capillary refill in the fingertips.  No wounds.  Mildly tender palpation base of index finger proximal phalanx.  She is in a well fitting thermoplastic splint. A/P: Right index finger nondisplaced proximal phalanx fracture.  Recommend continued right hand splint.  Follow-up in the office after discharge.

## 2021-11-05 NOTE — Progress Notes (Signed)
PT Cancellation Note  Patient Details Name: Diamond Bowman MRN: [id number] DOB: 03-26-1966   Cancelled Treatment:    Reason Eval/Treat Not Completed: Discussed with OT - will plan to hold PT Evaluation until after RUE splint made and WB precautions clarified. Will follow-up later this afternoon.  Mabeline Caras, PT, DPT Acute Rehabilitation Services  Pager [phone] Office [place] 11/05/2021, 12:10 PM

## 2021-11-05 NOTE — Assessment & Plan Note (Deleted)
O2 sats remained in upper 90s on room air.  Zithromax completed. -Continue Rocephin 1 g IV, day 4/5 -Follow-up blood cultures, NG x3 days -Incentive spirometer every 2 hours while awake

## 2021-11-05 NOTE — Progress Notes (Addendum)
Progress Note  Patient Name: Diamond Bowman Date of Encounter: 11/05/2021  Attending physician: McDiarmid, Blane Ohara, MD Primary care provider: Everardo Beals, NP  Subjective: Diamond Bowman is a 56 y.o. female who was seen and examined at bedside  Resting in bed after working w/ PT &OT w/ narrow complex tachycardia on telemetry w/ HR between 140-180bpm.  PT documents that w/ therapy her HR is around 224.   No family noted at bedside.  Case discussed and reviewed with her nurse.  Objective: Vital Signs in the last 24 hours: Temp:  [97.7 F (36.5 C)-98.6 F (37 C)] 98.3 F (36.8 C) (06/14 1227) Pulse Rate:  [74-93] 88 (06/14 1227) Resp:  [15-21] 20 (06/14 1227) BP: (96-159)/(46-71) 159/69 (06/14 1429) SpO2:  [91 %-100 %] 100 % (06/14 1227) Weight:  [55.7 kg] 55.7 kg (06/13 1848)  Intake/Output:  Intake/Output Summary (Last 24 hours) at 11/05/2021 1554 Last data filed at 11/05/2021 1300 Gross per 24 hour  Intake 1682.14 ml  Output 1125 ml  Net 557.14 ml    Net IO Since Admission: 863.61 mL [11/05/21 1554]  Weights:  Filed Weights   11/04/21 0804 11/04/21 1848  Weight: 59.9 kg 55.7 kg    Telemetry: Personally reviewed. Narrow complex tachycardia (mostly Afib over the last 24 and episode of NSR at baseline).   Physical examination: PHYSICAL EXAM:    11/05/2021    2:29 PM 11/05/2021   12:27 PM 11/05/2021    8:37 AM  Vitals with BMI  Systolic 338 250 539  Diastolic 69 70 46  Pulse  88 91    CONSTITUTIONAL: ill appearing, lying in bed, no acute distress, tachycardic, appears older than stated age SKIN: Skin is warm and dry. No rash noted. No cyanosis. No pallor. No jaundice HEAD: Normocephalic and atraumatic.  EYES: No scleral icterus MOUTH/THROAT: Moist oral membranes.  NECK: No JVD present. No thyromegaly noted. No carotid bruits  CHEST Normal respiratory effort. No intercostal retractions  LUNGS decreased breath sounds at the bases, kyphosis noted  posteriorly CARDIOVASCULAR: Tachycardic, irregularly irregular, variable S1-S2, no murmurs rubs or gallops.  Tachycardia. ABDOMINAL: Soft, nontender, nondistended, positive bowel sounds in all 4 quadrants, no apparent ascites.  EXTREMITIES: No pitting edema, warm to touch, 2+ bilateral DP and PT pulses HEMATOLOGIC: No significant bruising NEUROLOGIC: Oriented to person, place, and time. Nonfocal. Normal muscle tone.  PSYCHIATRIC: Normal mood and affect. Normal behavior. Cooperative  Lab Results: Hematology Recent Labs  Lab 11/04/21 0826 11/05/21 0111  WBC 12.8* 9.4  RBC 3.06* 2.78*  HGB 10.1* 9.0*  HCT 30.4* 27.9*  MCV 99.3 100.4*  MCH 33.0 32.4  MCHC 33.2 32.3  RDW 14.6 14.6  PLT 51* 38*    Chemistry Recent Labs  Lab 11/04/21 0826 11/05/21 0111  NA 138 142  K 4.2 3.8  CL 101 109  CO2 28 28  GLUCOSE 122* 131*  BUN 23* 17  CREATININE 1.35* 1.04*  CALCIUM 8.1* 7.7*  PROT 5.6*  --   ALBUMIN 2.3*  --   AST 19  --   ALT 12  --   ALKPHOS 46  --   BILITOT 0.7  --   GFRNONAA 46* >60  ANIONGAP 9 5     Cardiac Enzymes: Cardiac Panel (last 3 results) No results for input(s): "CKTOTAL", "CKMB", "TROPONINIHS", "RELINDX" in the last 72 hours.  BNP (last 3 results) No results for input(s): "BNP" in the last 8760 hours.  ProBNP (last 3 results) No results for input(s): "PROBNP"  in the last 8760 hours.   DDimer No results for input(s): "DDIMER" in the last 168 hours.   Hemoglobin A1c:  Lab Results  Component Value Date   HGBA1C 5.2 11/05/2021   MPG 102.54 11/05/2021    TSH  Recent Labs    11/04/21 0826  TSH 1.312    Lipid Panel No results found for: "CHOL", "TRIG", "HDL", "CHOLHDL", "VLDL", "LDLCALC", "LDLDIRECT"  Imaging: DG Chest 2 View  Result Date: 11/05/2021 CLINICAL DATA:  Community-acquired pneumonia. EXAM: CHEST - 2 VIEW COMPARISON:  Portable chest yesterday at 8:35 a.m. FINDINGS: AP Lat chest at 4:39 a.m., 11/05/2021. The patient is obliquely  rotated to the right. There is increased opacity in the left infrahilar area which could be atelectasis or a small infiltrate. It is not well localized on the lateral view. No pleural effusion is seen. The remaining lungs clear with COPD. There is aortic atherosclerosis with stable mediastinum. The cardiac size is normal. Thoracic spondylosis and bridging enthesopathy. IMPRESSION: Left infrahilar opacity which could be atelectasis or small developing pneumonia. COPD. Rotation to the right limits evaluation of the right lower lung field. Electronically Signed   By: Telford Nab M.D.   On: 11/05/2021 06:34   ECHOCARDIOGRAM COMPLETE  Result Date: 11/04/2021    ECHOCARDIOGRAM REPORT   Patient Name:   Diamond Bowman Date of Exam: 11/04/2021 Medical Rec #:  [id number]       Height:       54.0 in Accession #:    [id number]      Weight:       132.1 lb Date of Birth:  1965-08-25        BSA:          1.450 m Patient Age:    62 years        BP:           116/47 mmHg Patient Gender: F               HR:           85-171 bpm. Exam Location:  Inpatient Procedure: 2D Echo, Cardiac Doppler and Color Doppler Indications:     Dyspnea  History:         Patient has no prior history of Echocardiogram examinations.                  Arrythmias:Tachycardia; Signs/Symptoms:Hypotension. PNA with                  severe sepsis.  Sonographer:     Merrie Roof RDCS Referring Phys:  [id number] MADISON KOMMOR Diagnosing Phys: Rex Kras DO  Sonographer Comments: Suboptimal parasternal window and suboptimal subcostal window. IMPRESSIONS  1. Left ventricular ejection fraction, by estimation, is 55 to 60%. The left ventricle has normal function. The left ventricle has no regional wall motion abnormalities. Left ventricular diastolic parameters were normal.  2. Right ventricular systolic function is normal. The right ventricular size is normal.  3. The mitral valve is grossly normal. No evidence of mitral valve regurgitation. No evidence of mitral  stenosis.  4. The aortic valve was not well visualized. Aortic valve regurgitation is not visualized. No aortic stenosis is present.  5. Pulmonic valve regurgitation not well visualized. FINDINGS  Left Ventricle: Left ventricular ejection fraction, by estimation, is 55 to 60%. The left ventricle has normal function. The left ventricle has no regional wall motion abnormalities. The left ventricular internal cavity size was normal in size. There is  no  left ventricular hypertrophy. Left ventricular diastolic parameters were normal. Right Ventricle: The right ventricular size is normal. No increase in right ventricular wall thickness. Right ventricular systolic function is normal. Left Atrium: Left atrial size was normal in size. Right Atrium: Right atrial size was not well visualized. Pericardium: There is no evidence of pericardial effusion. Mitral Valve: The mitral valve is grossly normal. No evidence of mitral valve regurgitation. No evidence of mitral valve stenosis. Tricuspid Valve: The tricuspid valve is grossly normal. Tricuspid valve regurgitation is trivial. No evidence of tricuspid stenosis. Aortic Valve: The aortic valve was not well visualized. Aortic valve regurgitation is not visualized. No aortic stenosis is present. Aortic valve mean gradient measures 6.0 mmHg. Aortic valve peak gradient measures 9.4 mmHg. Aortic valve area, by VTI measures 1.71 cm. Pulmonic Valve: The pulmonic valve was not well visualized. Pulmonic valve regurgitation not well visualized. Aorta: The aortic root was not well visualized. IAS/Shunts: The interatrial septum was not well visualized.  LEFT VENTRICLE PLAX 2D LVIDd:         3.50 cm   Diastology LVIDs:         2.60 cm   LV e' medial:    6.96 cm/s LV PW:         1.00 cm   LV E/e' medial:  14.8 LV IVS:        0.80 cm   LV e' lateral:   7.83 cm/s LVOT diam:     2.00 cm   LV E/e' lateral: 13.2 LV SV:         56 LV SV Index:   39 LVOT Area:     3.14 cm  RIGHT VENTRICLE RV Basal  diam:  3.40 cm RV S prime:     13.50 cm/s TAPSE (M-mode): 1.7 cm LEFT ATRIUM             Index        RIGHT ATRIUM           Index LA diam:        2.50 cm 1.72 cm/m   RA Area:     16.40 cm LA Vol (A2C):   40.2 ml 27.73 ml/m  RA Volume:   43.80 ml  30.21 ml/m LA Vol (A4C):   37.1 ml 25.59 ml/m LA Biplane Vol: 40.3 ml 27.79 ml/m  AORTIC VALVE AV Area (Vmax):    1.93 cm AV Area (Vmean):   1.59 cm AV Area (VTI):     1.71 cm AV Vmax:           153.00 cm/s AV Vmean:          117.000 cm/s AV VTI:            0.329 m AV Peak Grad:      9.4 mmHg AV Mean Grad:      6.0 mmHg LVOT Vmax:         93.80 cm/s LVOT Vmean:        59.400 cm/s LVOT VTI:          0.179 m LVOT/AV VTI ratio: 0.54  AORTA Ao Root diam: 2.10 cm MITRAL VALVE MV Area (PHT): 4.63 cm     SHUNTS MV Decel Time: 164 msec     Systemic VTI:  0.18 m MV E velocity: 103.00 cm/s  Systemic Diam: 2.00 cm MV A velocity: 93.10 cm/s MV E/A ratio:  1.11 Thai Burgueno DO Electronically signed by Rex Kras DO Signature Date/Time: 11/04/2021/5:51:05 PM    Final  DG Hand Complete Right  Result Date: 11/04/2021 CLINICAL DATA:  Rule out fracture. Broken finger from previous injury. EXAM: RIGHT HAND - COMPLETE 3+ VIEW COMPARISON:  None FINDINGS: There is an acute fracture at the base of the proximal phalanx of the index finger, displaced perhaps 1 mm. Fracture extends to the articular surface. No other regional finding. IMPRESSION: Fracture at the base of the proximal phalanx of the index finger with intra-articular extension. Displaced 1 mm. Electronically Signed   By: Nelson Chimes M.D.   On: 11/04/2021 09:03   DG Chest Port 1 View  Result Date: 11/04/2021 CLINICAL DATA:  Sepsis.  Shortness of breath. EXAM: PORTABLE CHEST 1 VIEW COMPARISON:  Chest CT 03/13/2019 FINDINGS: The cardiac silhouette, mediastinal and hilar contours are within normal limits. Stable mild eventration of the right hemidiaphragm. The right basilar density could reflect an infiltrate or  atelectasis. No pleural effusions or pulmonary lesions. IMPRESSION: Right basilar density could reflect infiltrate or atelectasis. Electronically Signed   By: Marijo Sanes M.D.   On: 11/04/2021 09:03    CARDIAC DATABASE: EKG: 11/04/2021: Sinus tachycardia, 164 bpm with PACs and PVCs.   11/04/2021 9:31 AM: Narrow complex tachycardia likely atrial tach with frequent PACs, 181 bpm, ST-T changes likely rate related but ischemia cannot be ruled out.   11/04/2021 12:56 PM: Sinus rhythm, 66 bpm, frequent PACs with atrial bigeminy, without underlying injury pattern.     Echocardiogram: 11/04/2021  1. Left ventricular ejection fraction, by estimation, is 55 to 60%. The  left ventricle has normal function. The left ventricle has no regional  wall motion abnormalities. Left ventricular diastolic parameters were  normal.   2. Right ventricular systolic function is normal. The right ventricular  size is normal.   3. The mitral valve is grossly normal. No evidence of mitral valve  regurgitation. No evidence of mitral stenosis.   4. The aortic valve was not well visualized. Aortic valve regurgitation  is not visualized. No aortic stenosis is present.   5. Pulmonic valve regurgitation not well visualized.   Scheduled Meds:  azithromycin  500 mg Oral Daily   divalproex  1,500 mg Oral QHS   docusate sodium  100 mg Oral Daily   hydrocerin   Topical BID   hydrOXYzine  50 mg Oral BID   levothyroxine  75 mcg Oral Daily   mirtazapine  15 mg Oral QHS   OLANZapine zydis  15 mg Oral QHS   traZODone  300 mg Oral QHS   verapamil  40 mg Oral Q8H    Continuous Infusions:  [START ON 11/06/2021] cefTRIAXone (ROCEPHIN)  IV     diltiazem (CARDIZEM) infusion      PRN Meds:    IMPRESSION & RECOMMENDATIONS: Diamond Bowman is a 56 y.o. Caucasian female whose past medical history and cardiac risk factors include: Documented history of mental retardation, schizophrenia, tardive dyskinesia, history of falls and  fractures, thrombocytopenia, seizures, poor historian.  Impression: A-fib with RVR Paroxysmal episodes of atrial tachycardia along with normal sinus with atrial bigeminy Severe sepsis likely due to pneumonia. Protein calorie malnutrition. Documented history of mental retardation. History of spontaneous. History of cardiac dyskinesia. History of fall/fractures. Thrombocytopenia. Seizures  Plan: Atrial fibrillation with rapid ventricular rate On presentation to the ED she was having short episodes of paroxysmal A-fib and therefore shared decision was to control her ventricular rate and avoid antiarrhythmics and anticoagulation given her chronic comorbid conditions.  However, over the last 24 hours she has been having more  episodes of A-fib with RVR which warrants treatment. Start Cardizem drip. Start heparin thromboembolic prophylaxis. We will continue oral verapamil and increase dose as hemodynamics allows. Consult electrophysiology for additional guidance given her complex history and A-fib with RVR. Ideally would plan for 4 weeks of oral anticoagulation given her spurts of A-fib since hospitalization.  And given her low CHA2DS2-VASc score and after resolution of her severe sepsis her overall burden of A-fib should improve.  Given her comorbid conditions would recommend monitoring her for recurrence of AFib until unless change in clinical status. Plan of care discussed with nursing staff and EP CHA2DS2-VASc SCORE is 1 which correlates to 1.3 % risk of stroke per year (gender).  TSH within normal limits. Echo: Preserved LVEF, normal diastolic function, no significant valvular heart disease. Continue telemetry If patient's ventricular rate still does not improve may consider amnio drip for rate and rhythm control. Monitor for evidence of bleeding given thrombocytopenia.  Severe sepsis likely due to pneumonia: Management per primary team.  Thrombocytopenia: Present on arrival. We will  defer management to primary team Monitor for bleeding.  Attempted to call her mother Dondra Prader at [phone] had to leave a voicemail.   This note was created using a voice recognition software as a result there may be grammatical errors inadvertently enclosed that do not reflect the nature of this encounter. Every attempt is made to correct such errors.  Total time spent: 50 minutes. Given the management of A-fib with rapid ventricular rate requiring rate control strategy, discussion of anticoagulation given her comorbidities of anemia anemia, consultant discussing case with electrophysiology team and updating nursing staff  Rex Kras, [place], [facility]  Pager: [phone] Office: [phone] 11/05/2021, 3:54 PM

## 2021-11-05 NOTE — Progress Notes (Signed)
FPTS Brief Progress Note  S:Patient MEWS changed to Red, for tachycardia to 150's. Went to see patient. Patient at baseline status, mentating appropriately, and hemodynamically stable. Spoke with Dr. Terri Skains from Cardiology, who said we should continue to monitor, and give amiodarone time to take effect.     O: BP 140/78 (BP Location: Right Arm)   Pulse (!) 154   Temp 98.4 F (36.9 C) (Oral)   Resp (!) 24   Ht '4\' 6"'$  (1.372 m)   Wt 55.7 kg   SpO2 94%   BMI 29.61 kg/m   General: Alert, conversant, NAD, good mood Card: Tachycardic, [facility] Pulm: CTABL, comfortable work of breathing   A/P: Tachyrhythmia Patient noted to have been in A. Fib RVR and SVT, multiple times during the day. Patient was started on amiodarone at 7 pm, with mews change around 8:30. Cardiology recommending continue to monitor for now, as she is hemodynamically stable. Patient on heparin and is risk of bleeding with PLT 38. -Continue to monitor HR -Monitor for signs of bleeding/bruising -Continue amiodarone -Continue ditliazam -Continue verpaimil  -Continue heparin - Orders reviewed. Labs for AM ordered, which was adjusted as needed.   Holley Bouche, MD 11/05/2021, 9:08 PM PGY-1, [facility] Night Resident  Please page [phone] with questions.

## 2021-11-05 NOTE — Evaluation (Signed)
Occupational Therapy Evaluation Patient Details Name: Diamond Bowman MRN: [id number] DOB: 1965/10/20 Today's Date: 11/05/2021   History of Present Illness 56 y.o. female presents to emergency department for evaluation of cough, shortness of breath and altered mental status. Recent injury to R hand resulting in R prox phalanx fx of index finger.  PMH MDD, BiPolar, Schizophrenia, Mental Retardation, tardive dyskinesia, CKD 3, hypothyroidism   Clinical Impression   PTA pt lives with caregiver Docia Barrier) and goes to a day program M-F. At baseline, pt ambulates without an AD, however has required HHA this week due to being unsteady. She is able to complete her ADL tasks with S, especially to assure safety (pt drinks too much water and has to have S to not have too many liquids). Per family, caregiver will be able to provide necessary level of support to DC back to familiar environment with [facility] services. Will return to fabricate splint to stabilize R index finger.      Recommendations for follow up therapy are one component of a multi-disciplinary discharge planning process, led by the attending physician.  Recommendations may be updated based on patient status, additional functional criteria and insurance authorization.   Follow Up Recommendations  Home health OT    Assistance Recommended at Discharge Frequent or constant Supervision/Assistance  Patient can return home with the following A little help with walking and/or transfers;A lot of help with bathing/dressing/bathroom;Assistance with cooking/housework;Assistance with feeding;Direct supervision/assist for medications management;Assist for transportation;Help with stairs or ramp for entrance    Functional Status Assessment  Patient has had a recent decline in their functional status and demonstrates the ability to make significant improvements in function in a reasonable and predictable amount of time.  Equipment Recommendations  Tub/shower  seat    Recommendations for Other Services       Precautions / Restrictions Precautions Precautions: Fall Precaution Comments: watch HR Restrictions Weight Bearing Restrictions: Yes Other Position/Activity Restrictions: WB through elbow or ulnar side of hand      Mobility Bed Mobility Overal bed mobility: Needs Assistance Bed Mobility: Supine to Sit     Supine to sit: Min assist     General bed mobility comments: steadying assist and encouragement to mobilize    Transfers Overall transfer level: Needs assistance Equipment used: 2 person hand held assist Transfers: Sit to/from Stand Sit to Stand: Min assist, +2 safety/equipment                  Balance Overall balance assessment: Needs assistance   Sitting balance-Leahy Scale: Fair       Standing balance-Leahy Scale: Poor                             ADL either performed or assessed with clinical judgement   ADL Overall ADL's : Needs assistance/impaired Eating/Feeding: Minimal assistance   Grooming: Moderate assistance   Upper Body Bathing: Moderate assistance   Lower Body Bathing: Moderate assistance   Upper Body Dressing : Moderate assistance   Lower Body Dressing: Moderate assistance   Toilet Transfer: Minimal assistance Toilet Transfer Details (indicate cue type and reason): HHA   Toileting - Clothing Manipulation Details (indicate cue type and reason): uses depends at baseline and is incontinenet at times however also goes to the bathroom     Functional mobility during ADLs: Minimal assistance;+2 for safety/equipment       Vision         Perception  Praxis      Pertinent Vitals/Pain Pain Assessment Pain Assessment: Faces Faces Pain Scale: Hurts little more Pain Location: generalized discomfort Pain Descriptors / Indicators: Discomfort Pain Intervention(s): Limited activity within patient's tolerance     Hand Dominance Right   Extremity/Trunk Assessment  Upper Extremity Assessment Upper Extremity Assessment: RUE deficits/detail RUE Deficits / Details: bruising @ index and middle fingers; using R hand to color however sister states she is having trouble feeding herself RUE Coordination: decreased fine motor   Lower Extremity Assessment Lower Extremity Assessment: Defer to PT evaluation   Cervical / Trunk Assessment Cervical / Trunk Assessment: Kyphotic   Communication Communication Communication: Expressive difficulties (at baseline; difficult to understand at times)   Cognition Arousal/Alertness: Awake/alert Behavior During Therapy: WFL for tasks assessed/performed Overall Cognitive Status: History of cognitive impairments - at baseline                                       General Comments       Exercises     Shoulder Instructions      Home Living Family/patient expects to be discharged to:: Private residence Living Arrangements: Other (Comment) (therapy Aide) Available Help at Discharge: Available 24 hours/day;Personal care attendant Type of Home: House       Home Layout: Two level;Bed/bath upstairs     Bathroom Shower/Tub: Teacher, early years/pre: Standard Bathroom Accessibility: Yes How Accessible: Accessible via walker Home Equipment: Other (comment) (unsure)          Prior Functioning/Environment Prior Level of Function : Needs assist  Cognitive Assist : ADLs (cognitive);Mobility (cognitive) Mobility (Cognitive): Set up cues ADLs (Cognitive): Intermittent cues Physical Assist : Mobility (physical);ADLs (physical) Mobility (physical):  (aide assists duet o unsteadiness) ADLs (physical): Bathing;Dressing;Toileting   ADLs Comments: Aide supervises to make sure pt doesn't drink water from the washcloth; given more support this week        OT Problem List: Decreased strength;Decreased activity tolerance;Impaired balance (sitting and/or standing);Decreased coordination;Decreased  cognition;Decreased safety awareness;Decreased knowledge of use of DME or AE;Cardiopulmonary status limiting activity;Impaired UE functional use      OT Treatment/Interventions: Self-care/ADL training;Therapeutic exercise;DME and/or AE instruction;Therapeutic activities;Splinting;Cognitive remediation/compensation;Patient/family education;Balance training    OT Goals(Current goals can be found in the care plan section) Acute Rehab OT Goals Patient Stated Goal: to get better OT Goal Formulation: With patient/family Time For Goal Achievement: 11/19/21 Potential to Achieve Goals: Good  OT Frequency: Min 2X/week    Co-evaluation              AM-PAC OT "6 Clicks" Daily Activity     Outcome Measure Help from another person eating meals?: A Little Help from another person taking care of personal grooming?: A Lot Help from another person toileting, which includes using toliet, bedpan, or urinal?: Total Help from another person bathing (including washing, rinsing, drying)?: A Lot Help from another person to put on and taking off regular upper body clothing?: A Lot Help from another person to put on and taking off regular lower body clothing?: A Lot 6 Click Score: 12   End of Session Equipment Utilized During Treatment: Gait belt Nurse Communication: Mobility status  Activity Tolerance: Patient tolerated treatment well Patient left: in bed;with call bell/phone within reach;with bed alarm set;with family/visitor present  OT Visit Diagnosis: Unsteadiness on feet (R26.81);Other abnormalities of gait and mobility (R26.89);Muscle weakness (generalized) (M62.81);Other symptoms and signs involving  cognitive function                Time: 9791-5041 OT Time Calculation (min): 18 min Charges:  OT General Charges $OT Visit: 1 Visit OT Evaluation $OT Eval Moderate Complexity: [place], OT/L   Acute OT Clinical Specialist Acute Rehabilitation Services Pager [phone] Office  [phone]   [facility] 11/05/2021, 3:00 PM

## 2021-11-05 NOTE — Assessment & Plan Note (Deleted)
Chronic. Platelets 51>38>50. -Heparin IV

## 2021-11-05 NOTE — Progress Notes (Signed)
     Daily Progress Note Intern Pager: [phone]  Patient name: Diamond Bowman Medical record number: [id number] Date of birth: [birth year] Age: 56 y.o. Gender: female  Primary Care Provider: Everardo Beals, NP Consultants: Orthopedics, cardiology Code Status: Full  Pt Overview and Major Events to Date:  6/13: admitted  Assessment and Plan: TRACEY HERMANCE is a 56 y.o. female presenting with community-acquired pneumonia meeting sepsis criteria and paroxysmal A-fib.  PMH significant for MDD, tardive dyskinesia, CKD 3, hypothyroidism, schizophrenia, intellectual disability, seizures, and falls. * Community acquired pneumonia WBC 12.8>9.4.  O2 sats remained in upper 90s on room air. CXR shows left infrahilar opacity which could be atelectasis or small developing pneumonia.  -Continue Zithromax 500 mg PO, day 2/3 -Continue Rocephin 1 g IV, day 2/5 -CBC with differential tomorrow a.m. -Follow-up blood cultures, NG x1 day -Incentive spirometer every 2 hours while awake  Closed nondisplaced fracture of distal phalanx of right index finger Nonoperative management via splint per orthopedics -Follow-up with orthopedics outpatient in 1 week  Atrial fibrillation ([place]) Originally stated to have narrow complex SVT with frequent PACs.  Per cardiology, has brief episodes of paroxysmal A-fib which spontaneously converts to NSR. Improved since being started on verapamil, NSR currently. Echo with LVEF 55 to 60% and no regional wall motion abnormalities. -Cardiology following, appreciate recommendations -Continue verapamil 40 mg every 8 hours -Start Cardizem IV -Start heparin IV  Thrombocytopenic disorder (HCC) Chronic. Platelets 51>38, likely dilutional component relating to decrease. -Hold chemical VTE prophylaxis -SCDs -A.m. CBC  FEN/GI: Regular PPx: SCDs, hold DVT prophylaxis for thrombocytopenia Dispo:Home with home health pending clinical improvement .  Subjective:  Patient is doing  well and content with coloring on coloring book with crayons.  Denies difficulty breathing.  Objective: Temp:  [97.7 F (36.5 C)-98.6 F (37 C)] 98.3 F (36.8 C) (06/14 1227) Pulse Rate:  [74-93] 88 (06/14 1227) Resp:  [17-21] 20 (06/14 1227) BP: (96-159)/(46-71) 159/69 (06/14 1429) SpO2:  [91 %-100 %] 100 % (06/14 1227) Weight:  [55.7 kg] 55.7 kg (06/13 1848) Physical Exam: General: Awake, alert, NAD Cardiovascular: RRR, no murmurs auscultated Respiratory: Diminished breath sounds bilaterally, productive cough Abdomen: Soft, nontender, normoactive bowel sounds Extremities: No pitting edema appreciated  Laboratory: Most recent CBC Lab Results  Component Value Date   WBC 9.4 11/05/2021   HGB 9.0 (L) 11/05/2021   HCT 27.9 (L) 11/05/2021   MCV 100.4 (H) 11/05/2021   PLT 38 (L) 11/05/2021   Most recent BMP    Latest Ref Rng & Units 11/05/2021    1:11 AM  BMP  Glucose 70 - 99 mg/dL 131   BUN 6 - 20 mg/dL 17   Creatinine 0.44 - 1.00 mg/dL 1.04   Sodium 135 - 145 mmol/L 142   Potassium 3.5 - 5.1 mmol/L 3.8   Chloride 98 - 111 mmol/L 109   CO2 22 - 32 mmol/L 28   Calcium 8.9 - 10.3 mg/dL 7.7    Imaging/Diagnostic Tests: CXR: Left infrahilar opacity could be atelectasis or small developing pneumonia Wells Guiles, DO 11/05/2021, 4:39 PM  PGY-1, [place] Intern pager: [phone], text pages welcome Secure chat group [place]

## 2021-11-05 NOTE — Assessment & Plan Note (Addendum)
Remains in NSR.  -Verapamil 240 mg p.o. daily -Discontinue Amiodarone -Change Xarelto to Eliquis '5mg'$  BID -Follow-up with cardiology outpatient in 4 weeks

## 2021-11-05 NOTE — Progress Notes (Signed)
ANTICOAGULATION CONSULT NOTE - Initial Consult  Pharmacy Consult for Heparin Indication: atrial fibrillation  No Known Allergies  Patient Measurements: Height: '4\' 6"'$  (137.2 cm) Weight: 55.7 kg (122 lb 12.7 oz) IBW/kg (Calculated) : 31.7 Heparin Dosing Weight: 46 kg  Vital Signs: Temp: 98.3 F (36.8 C) (06/14 1227) Temp Source: Oral (06/14 1227) BP: 159/69 (06/14 1429) Pulse Rate: 88 (06/14 1227)  Labs: Recent Labs    11/04/21 0826 11/05/21 0111  HGB 10.1* 9.0*  HCT 30.4* 27.9*  PLT 51* 38*  APTT 34  --   LABPROT 18.3*  --   INR 1.5*  --   CREATININE 1.35* 1.04*    Estimated Creatinine Clearance: 38.4 mL/min (A) (by C-G formula based on SCr of 1.04 mg/dL (H)).   Medical History: Past Medical History:  Diagnosis Date   Anemia    Bipolar 2 disorder ([place]) 05/14/2012   Chronic kidney disease    stage 3   Chronic kidney disease, stage 3 ([place]) 01/04/2015   Encephalopathy 01/04/2015   Fall    Heart murmur    stable for yrs-from childhood.   Hypothyroidism 05/14/2012   Intellectual disability 05/14/2012   Mental retardation    Monteggia's fracture 03/31/2019   Osteoporosis    Schizophrenia ([place])    Seizures ([place])    last one > 5 yrs   Tardive dyskinesia    Thrombocytopenic disorder ([place]) 05/06/2020   Tinea pedis 11/04/2021   Venous stasis syndrome 11/04/2021    Medications:  Medications Prior to Admission  Medication Sig Dispense Refill Last Dose   alendronate (FOSAMAX) 70 MG tablet Take 70 mg by mouth every Thursday. Take with a full glass of water on an empty stomach.   10/30/2021   cyproheptadine (PERIACTIN) 4 MG tablet Take 4 mg by mouth at bedtime.   11/03/2021   divalproex (DEPAKOTE ER) 500 MG 24 hr tablet Take 3 tablets (1,500 mg total) by mouth at bedtime.   11/03/2021   docusate sodium (COLACE) 100 MG capsule Take 100 mg by mouth daily.   11/04/2021   Emollient (EUCERIN) lotion Apply topically See admin instructions. Apply topically to entire body twice  daily   11/03/2021   Ferrous Sulfate 140 (45 Fe) MG TBCR Take 1 tablet by mouth daily.   11/03/2021   furosemide (LASIX) 20 MG tablet Take 20 mg by mouth.   unknown   GNP CALCIUM 500 +D3 500-15 MG-MCG TABS Take 1 tablet by mouth daily.   11/03/2021   hydrOXYzine (VISTARIL) 50 MG capsule Take 50 mg by mouth 2 (two) times daily.   11/03/2021   Krill Oil 1000 MG CAPS Take 1,000 mg by mouth daily.   11/03/2021   levothyroxine (SYNTHROID) 75 MCG tablet Take 75 mcg by mouth daily.   11/03/2021   loratadine (CLARITIN) 10 MG tablet Take 10 mg by mouth daily.   11/03/2021   mirtazapine (REMERON) 15 MG tablet Take 15 mg by mouth at bedtime.   11/03/2021   Multiple Vitamin (MULTIVITAMIN WITH MINERALS) TABS tablet Take 1 tablet by mouth daily.   11/03/2021   olanzapine zydis (ZYPREXA) 15 MG disintegrating tablet Take 1 tablet (15 mg total) by mouth at bedtime.   11/03/2021   traZODone (DESYREL) 100 MG tablet Take 300 mg by mouth at bedtime.    11/03/2021    Assessment: 56  y.o. F presents with cough/SOB/fever- found to have afib. To begin heparin per pharmacy for afib. No AC PTA. Noted pt is poor candidate for long-term anticoagulation.  Plt down to 38 this morning (pt with chronic thrombocytopenia - usual range 50-80s). Hgb 9.   Goal of Therapy:  Heparin level 0.3-0.7 units/ml Monitor platelets by anticoagulation protocol: Yes   Plan:  Heparin IV 3000 units bolus  Heparin gtt at 700 units/hr Will f/u heparin level in 8 hours Daily heparin level and CBC. F/u plt closely.  Sherlon Handing, PharmD, BCPS Please see amion for complete clinical pharmacist phone list 11/05/2021,4:06 PM

## 2021-11-05 NOTE — Progress Notes (Signed)
OT Treatment Note  Pt seen for fabrication of hand based R radial gutter splint for index finger to stabilize proximal phalanx while allowing functional use of R hand. Pt tolerated well. Will return to complete a splint check.  Recommend splint be removed q 2 hours today to check for any pressure areas/areas of concern. If pt tolerates well, recommend pt wear splint at all times with the exception of ADL. After fabrication of splint, mobilized pt with PT. Pt required encouragement to mobilize OOB, however once mobilizing noted increased WOB and Max HR @ 224 - nsg aware.  Pt requiring min A with HHA although also reaching to hold onto rail of wall. If RW used, pt needs to hold RW with ulnar side of R hand per PA. Family present and educated on splint care and POC. Will continue to follow.    11/05/21 1500  OT Visit Information  Last OT Received On 11/05/21  Assistance Needed +1  PT/OT/SLP Co-Evaluation/Treatment Yes (partial session)  Reason for Co-Treatment Necessary to address cognition/behavior during functional activity;To address functional/ADL transfers  History of Present Illness 56 y.o. female presents to emergency department for evaluation of cough, shortness of breath and altered mental status. Recent injury to R hand resulting in R prox phalanx fx of index finger.  PMH MDD, BiPolar, Schizophrenia, Mental Retardation, tardive dyskinesia, CKD 3, hypothyroidism  Precautions  Precaution Comments watch HR  Restrictions  Weight Bearing Restrictions Yes  Other Position/Activity Restrictions WB through elbow or ulnar side of hand  Pain Assessment  Pain Assessment Faces  Faces Pain Scale 4  Pain Location generalized discomfort  Pain Descriptors / Indicators Discomfort  Pain Intervention(s) Limited activity within patient's tolerance  Cognition  Arousal/Alertness Awake/alert  Behavior During Therapy WFL for tasks assessed/performed;Impulsive  Overall Cognitive Status History of cognitive  impairments - at baseline  General Comments most likely at baseline  General Comments  General comments (skin integrity, edema, etc.) R modified hand based radial gutter fabricated to stabilize R index finger; mobilized with PT using HHA +2  OT - End of Session  Equipment Utilized During Treatment Gait belt  Activity Tolerance Patient tolerated treatment well  Patient left in bed;with call bell/phone within reach;with bed alarm set;with family/visitor present  Nurse Communication Mobility status  OT Assessment/Plan  OT Plan Discharge plan remains appropriate  OT Visit Diagnosis Unsteadiness on feet (R26.81);Other abnormalities of gait and mobility (R26.89);Muscle weakness (generalized) (M62.81);Other symptoms and signs involving cognitive function  OT Frequency (ACUTE ONLY) Min 2X/week  Follow Up Recommendations Home health OT  Assistance recommended at discharge Frequent or constant Supervision/Assistance  Patient can return home with the following A little help with walking and/or transfers;A lot of help with bathing/dressing/bathroom;Assistance with cooking/housework;Assistance with feeding;Direct supervision/assist for medications management;Assist for transportation;Help with stairs or ramp for entrance  OT Equipment Tub/shower seat  AM-PAC OT "6 Clicks" Daily Activity Outcome Measure (Version 2)  Help from another person eating meals? 3  Help from another person taking care of personal grooming? 2  Help from another person toileting, which includes using toliet, bedpan, or urinal? 1  Help from another person bathing (including washing, rinsing, drying)? 2  Help from another person to put on and taking off regular upper body clothing? 2  Help from another person to put on and taking off regular lower body clothing? 2  6 Click Score 12  Progressive Mobility  What is the highest level of mobility based on the progressive mobility assessment? Level 5 (Walks with assist  in room/hall) -  Balance while stepping forward/back and can walk in room with assist - Complete  Activity Ambulated with assistance in hallway  OT Goal Progression  Progress towards OT goals Progressing toward goals  Acute Rehab OT Goals  Patient Stated Goal to get better  OT Goal Formulation With patient/family  Time For Goal Achievement 11/19/21  Potential to Achieve Goals Good  OT Time Calculation  OT Start Time (ACUTE ONLY) 1306  OT Stop Time (ACUTE ONLY) 1420  OT Time Calculation (min) 74 min  OT General Charges  $OT Visit 1 Visit  OT Treatments  $Therapeutic Activity 23-37 mins  $Orthotics Fit/Training 23-37 mins  $ Splint materials basic 1 Supply  $ OT Supplies [place], OT/L   Acute OT Clinical Specialist Acute Rehabilitation Services Pager [phone] Office [phone]

## 2021-11-05 NOTE — Progress Notes (Signed)
FPTS Brief Progress Note  S: Patient with intermittent tachydysrhythmia, notes heart racing. Attempted valsalva maneuver with straw (due to intellectual disability and tardive dyskinesia- limited ability to try other maneuvers).   O: BP (!) 101/56 (BP Location: Right Arm)   Pulse 81   Temp 98.2 F (36.8 C) (Oral)   Resp 19   Ht '4\' 6"'$  (1.372 m)   Wt 55.7 kg   SpO2 91%   BMI 29.61 kg/m   Nursing note and vitals reviewed GEN: thin, WW, resting comfortably in chair, NAD Cardiac: tachycardic rate and irregularly irregular rhythm. Normal S1/S2. No murmurs, rubs, or gallops appreciated. 2+ radial pulses. Lungs: Clear bilaterally to ascultation. No increased WOB, no accessory muscle usage. No w/r/r. Neuro: Aox2- at baseline Ext: no edema Psych: Pleasant and appropriate   A/P: Tachyarrhythmia Patient is intermittently in SVT to 140s-180s, then paroxysmally in a fib. EKG patient had converted out of a fib, other than HR, vitals are stable. After about 20 mins, patient appears to be in NSR now. - First dose of verapamil given, will give time for this medication to work - If no improvement after verapamil, will discuss with cardiology and likely initiate amiodarone - Will only start heparin if persistently in afib given low CHADSVASC score and h/o of thrombocytopenia, plts 50 - Orders reviewed. Labs for AM ordered, which was adjusted as needed.   Gladys Damme, MD 11/05/2021, 12:56 AM PGY-3, [facility] Night Resident  Please page [phone] with questions.

## 2021-11-05 NOTE — Assessment & Plan Note (Deleted)
Nonoperative management via splint per orthopedics -Follow-up with orthopedics outpatient in 1 week

## 2021-11-05 NOTE — Consult Note (Addendum)
ELECTROPHYSIOLOGY CONSULT NOTE    Patient ID: Diamond Bowman MRN: [id number], DOB/AGE: 1965/06/05 56 y.o.  Admit date: 11/04/2021 Date of Consult: 11/05/2021  Primary Physician: Everardo Beals, NP Primary Cardiologist: None  Electrophysiologist:  New  Referring Provider: Dr. Terri Skains  Patient Profile: Diamond Bowman is a 56 y.o. female with a history of mental retardation, frequent falls, tardive dyskinesia, and CKD III who is being seen today for the evaluation of SVT at the request of Dr. Terri Skains.  HPI:  Diamond Bowman is a 56 y.o. female with medical history as above.   Of note, pt is a very poor historian due to history of mentral retardation, bipolar disorder, schizophrenia, and tardive dyskinesia.   Pt presented 11/04/2021 with c/c of SOB, productive cough, and fever x 1 week. EMS was called after she developed hypotension at her adult day care.    In the ED she was tachypneic without hypoxemia, tachycardic, with intermittent salvos of an SVT, HR up to 180's on ECG, and febrile 100.2.  She was placed on 4L oxygen N/C and weaned to 1L.  Labs significant for elevated WBCs. Low platelets and component of chronic anemia noted. Normal TSH and Lactic Acid.    CXR showed right bibasilar infiltrate vs atelectasis. BCx obtain and started on ABx and IVF.   Overnight she had a longer episode of a sustained SVT in the 160-170s and this afternoon again is having a long episode of SVT in the 160-180s.  Review of tele also shows AF with RVR. EP consulted for recommendations of rhythm control and anticoagulation.   Pt is in bed in NAD. History is obtained by chart, and she is verbal but unable to appropriately follow conversation prompts. She has been overall hemodynamically stable despite tachy-arrhythmias. Diltiazem has been ordered but not yet started.   Past Medical History:  Diagnosis Date   Anemia    Bipolar 2 disorder ([place]) 05/14/2012   Chronic kidney disease    stage 3   Chronic  kidney disease, stage 3 ([place]) 01/04/2015   Encephalopathy 01/04/2015   Fall    Heart murmur    stable for yrs-from childhood.   Hypothyroidism 05/14/2012   Intellectual disability 05/14/2012   Mental retardation    Monteggia's fracture 03/31/2019   Osteoporosis    Schizophrenia ([place])    Seizures ([place])    last one > 5 yrs   Tardive dyskinesia    Thrombocytopenic disorder ([place]) 05/06/2020   Tinea pedis 11/04/2021   Venous stasis syndrome 11/04/2021     Surgical History:  Past Surgical History:  Procedure Laterality Date   I & D EXTREMITY Right 03/13/2019   Procedure: IRRIGATION AND DEBRIDEMENT EXTREMITY;  Surgeon: Verner Mould, MD;  Location: [facility];  Service: Orthopedics;  Laterality: Right;   ORIF ULNAR FRACTURE Right 03/13/2019   Procedure: OPEN REDUCTION INTERNAL FIXATION (ORIF) ULNAR FRACTURE;  Surgeon: Verner Mould, MD;  Location: [facility];  Service: Orthopedics;  Laterality: Right;   RADIAL HEAD ARTHROPLASTY Right 03/13/2019   Procedure: RADIAL HEAD ARTHROPLASTY;  Surgeon: Verner Mould, MD;  Location: [facility];  Service: Orthopedics;  Laterality: Right;     Medications Prior to Admission  Medication Sig Dispense Refill Last Dose   alendronate (FOSAMAX) 70 MG tablet Take 70 mg by mouth every Thursday. Take with a full glass of water on an empty stomach.   10/30/2021   cyproheptadine (PERIACTIN) 4 MG tablet Take 4 mg by mouth at bedtime.  11/03/2021   divalproex (DEPAKOTE ER) 500 MG 24 hr tablet Take 3 tablets (1,500 mg total) by mouth at bedtime.   11/03/2021   docusate sodium (COLACE) 100 MG capsule Take 100 mg by mouth daily.   11/04/2021   Emollient (EUCERIN) lotion Apply topically See admin instructions. Apply topically to entire body twice daily   11/03/2021   Ferrous Sulfate 140 (45 Fe) MG TBCR Take 1 tablet by mouth daily.   11/03/2021   furosemide (LASIX) 20 MG tablet Take 20 mg by mouth.   unknown   GNP CALCIUM 500 +D3 500-15 MG-MCG TABS Take 1 tablet  by mouth daily.   11/03/2021   hydrOXYzine (VISTARIL) 50 MG capsule Take 50 mg by mouth 2 (two) times daily.   11/03/2021   Krill Oil 1000 MG CAPS Take 1,000 mg by mouth daily.   11/03/2021   levothyroxine (SYNTHROID) 75 MCG tablet Take 75 mcg by mouth daily.   11/03/2021   loratadine (CLARITIN) 10 MG tablet Take 10 mg by mouth daily.   11/03/2021   mirtazapine (REMERON) 15 MG tablet Take 15 mg by mouth at bedtime.   11/03/2021   Multiple Vitamin (MULTIVITAMIN WITH MINERALS) TABS tablet Take 1 tablet by mouth daily.   11/03/2021   olanzapine zydis (ZYPREXA) 15 MG disintegrating tablet Take 1 tablet (15 mg total) by mouth at bedtime.   11/03/2021   traZODone (DESYREL) 100 MG tablet Take 300 mg by mouth at bedtime.    11/03/2021    Inpatient Medications:   azithromycin  500 mg Oral Daily   divalproex  1,500 mg Oral QHS   docusate sodium  100 mg Oral Daily   hydrocerin   Topical BID   hydrOXYzine  50 mg Oral BID   levothyroxine  75 mcg Oral Daily   mirtazapine  15 mg Oral QHS   OLANZapine zydis  15 mg Oral QHS   traZODone  300 mg Oral QHS   verapamil  40 mg Oral Q8H    Allergies: No Known Allergies  Social History   Socioeconomic History   Marital status: Single    Spouse name: Not on file   Number of children: Not on file   Years of education: Not on file   Highest education level: Not on file  Occupational History   Not on file  Tobacco Use   Smoking status: Never   Smokeless tobacco: Never  Vaping Use   Vaping Use: Never used  Substance and Sexual Activity   Alcohol use: No   Drug use: No   Sexual activity: Not on file  Other Topics Concern   Not on file  Social History Narrative   Patient has been living at her current arrangements with Mrs. Docia Barrier at telephone number [phone]   Mother can be reached at [phone]   Patient is pretty much total care-she is incontinent of urine and stool.   Social Determinants of Health   Financial Resource Strain: Not on file  Food  Insecurity: Not on file  Transportation Needs: Not on file  Physical Activity: Not on file  Stress: Not on file  Social Connections: Not on file  Intimate Partner Violence: Not on file     Family History  Problem Relation Age of Onset   Osteoarthritis Mother    Colon polyps Mother 72   Diabetes Father    Colon cancer Neg Hx    Esophageal cancer Neg Hx    Rectal cancer Neg Hx    Stomach cancer  Neg Hx      Review of Systems: All other systems reviewed and are otherwise negative except as noted above.  Physical Exam: Vitals:   11/05/21 0536 11/05/21 0837 11/05/21 1227 11/05/21 1429  BP: (!) 124/47 (!) 116/46 118/70 (!) 159/69  Pulse: 83 91 88   Resp: '19 18 20   '$ Temp: 98.1 F (36.7 C) 98.6 F (37 C) 98.3 F (36.8 C)   TempSrc: Oral Oral Oral   SpO2: 96% 97% 100%   Weight:      Height:        GEN- The patient is in bed, NAD. Alert to person and voice.  Lungs- Normal work of breathing.   Heart- Very tachy and irregular rhythm.  GI- soft, non-tender, non-distended, bowel sounds present Extremities- no clubbing, cyanosis, or edema; DP/PT/radial pulses 2+ bilaterally MS- R hand in splint.  Skin- warm and dry, no rash or lesion Psych- Flat affect.   Labs:   Lab Results  Component Value Date   WBC 9.4 11/05/2021   HGB 9.0 (L) 11/05/2021   HCT 27.9 (L) 11/05/2021   MCV 100.4 (H) 11/05/2021   PLT 38 (L) 11/05/2021    Recent Labs  Lab 11/04/21 0826 11/05/21 0111  NA 138 142  K 4.2 3.8  CL 101 109  CO2 28 28  BUN 23* 17  CREATININE 1.35* 1.04*  CALCIUM 8.1* 7.7*  PROT 5.6*  --   BILITOT 0.7  --   ALKPHOS 46  --   ALT 12  --   AST 19  --   GLUCOSE 122* 131*      Radiology/Studies: DG Chest 2 View  Result Date: 11/05/2021 CLINICAL DATA:  Community-acquired pneumonia. EXAM: CHEST - 2 VIEW COMPARISON:  Portable chest yesterday at 8:35 a.m. FINDINGS: AP Lat chest at 4:39 a.m., 11/05/2021. The patient is obliquely rotated to the right. There is increased  opacity in the left infrahilar area which could be atelectasis or a small infiltrate. It is not well localized on the lateral view. No pleural effusion is seen. The remaining lungs clear with COPD. There is aortic atherosclerosis with stable mediastinum. The cardiac size is normal. Thoracic spondylosis and bridging enthesopathy. IMPRESSION: Left infrahilar opacity which could be atelectasis or small developing pneumonia. COPD. Rotation to the right limits evaluation of the right lower lung field. Electronically Signed   By: Telford Nab M.D.   On: 11/05/2021 06:34   ECHOCARDIOGRAM COMPLETE  Result Date: 11/04/2021    ECHOCARDIOGRAM REPORT   Patient Name:   Diamond Bowman Date of Exam: 11/04/2021 Medical Rec #:  [id number]       Height:       54.0 in Accession #:    [id number]      Weight:       132.1 lb Date of Birth:  February 17, 1966        BSA:          1.450 m Patient Age:    15 years        BP:           116/47 mmHg Patient Gender: F               HR:           85-171 bpm. Exam Location:  Inpatient Procedure: 2D Echo, Cardiac Doppler and Color Doppler Indications:     Dyspnea  History:         Patient has no prior history of Echocardiogram examinations.  Arrythmias:Tachycardia; Signs/Symptoms:Hypotension. PNA with                  severe sepsis.  Sonographer:     Merrie Roof RDCS Referring Phys:  [id number] MADISON KOMMOR Diagnosing Phys: Rex Kras DO  Sonographer Comments: Suboptimal parasternal window and suboptimal subcostal window. IMPRESSIONS  1. Left ventricular ejection fraction, by estimation, is 55 to 60%. The left ventricle has normal function. The left ventricle has no regional wall motion abnormalities. Left ventricular diastolic parameters were normal.  2. Right ventricular systolic function is normal. The right ventricular size is normal.  3. The mitral valve is grossly normal. No evidence of mitral valve regurgitation. No evidence of mitral stenosis.  4. The aortic valve was not well  visualized. Aortic valve regurgitation is not visualized. No aortic stenosis is present.  5. Pulmonic valve regurgitation not well visualized. FINDINGS  Left Ventricle: Left ventricular ejection fraction, by estimation, is 55 to 60%. The left ventricle has normal function. The left ventricle has no regional wall motion abnormalities. The left ventricular internal cavity size was normal in size. There is  no left ventricular hypertrophy. Left ventricular diastolic parameters were normal. Right Ventricle: The right ventricular size is normal. No increase in right ventricular wall thickness. Right ventricular systolic function is normal. Left Atrium: Left atrial size was normal in size. Right Atrium: Right atrial size was not well visualized. Pericardium: There is no evidence of pericardial effusion. Mitral Valve: The mitral valve is grossly normal. No evidence of mitral valve regurgitation. No evidence of mitral valve stenosis. Tricuspid Valve: The tricuspid valve is grossly normal. Tricuspid valve regurgitation is trivial. No evidence of tricuspid stenosis. Aortic Valve: The aortic valve was not well visualized. Aortic valve regurgitation is not visualized. No aortic stenosis is present. Aortic valve mean gradient measures 6.0 mmHg. Aortic valve peak gradient measures 9.4 mmHg. Aortic valve area, by VTI measures 1.71 cm. Pulmonic Valve: The pulmonic valve was not well visualized. Pulmonic valve regurgitation not well visualized. Aorta: The aortic root was not well visualized. IAS/Shunts: The interatrial septum was not well visualized.  LEFT VENTRICLE PLAX 2D LVIDd:         3.50 cm   Diastology LVIDs:         2.60 cm   LV e' medial:    6.96 cm/s LV PW:         1.00 cm   LV E/e' medial:  14.8 LV IVS:        0.80 cm   LV e' lateral:   7.83 cm/s LVOT diam:     2.00 cm   LV E/e' lateral: 13.2 LV SV:         56 LV SV Index:   39 LVOT Area:     3.14 cm  RIGHT VENTRICLE RV Basal diam:  3.40 cm RV S prime:     13.50 cm/s  TAPSE (M-mode): 1.7 cm LEFT ATRIUM             Index        RIGHT ATRIUM           Index LA diam:        2.50 cm 1.72 cm/m   RA Area:     16.40 cm LA Vol (A2C):   40.2 ml 27.73 ml/m  RA Volume:   43.80 ml  30.21 ml/m LA Vol (A4C):   37.1 ml 25.59 ml/m LA Biplane Vol: 40.3 ml 27.79 ml/m  AORTIC VALVE AV Area (Vmax):  1.93 cm AV Area (Vmean):   1.59 cm AV Area (VTI):     1.71 cm AV Vmax:           153.00 cm/s AV Vmean:          117.000 cm/s AV VTI:            0.329 m AV Peak Grad:      9.4 mmHg AV Mean Grad:      6.0 mmHg LVOT Vmax:         93.80 cm/s LVOT Vmean:        59.400 cm/s LVOT VTI:          0.179 m LVOT/AV VTI ratio: 0.54  AORTA Ao Root diam: 2.10 cm MITRAL VALVE MV Area (PHT): 4.63 cm     SHUNTS MV Decel Time: 164 msec     Systemic VTI:  0.18 m MV E velocity: 103.00 cm/s  Systemic Diam: 2.00 cm MV A velocity: 93.10 cm/s MV E/A ratio:  1.11 Sunit Tolia DO Electronically signed by Rex Kras DO Signature Date/Time: 11/04/2021/5:51:05 PM    Final    DG Hand Complete Right  Result Date: 11/04/2021 CLINICAL DATA:  Rule out fracture. Broken finger from previous injury. EXAM: RIGHT HAND - COMPLETE 3+ VIEW COMPARISON:  None FINDINGS: There is an acute fracture at the base of the proximal phalanx of the index finger, displaced perhaps 1 mm. Fracture extends to the articular surface. No other regional finding. IMPRESSION: Fracture at the base of the proximal phalanx of the index finger with intra-articular extension. Displaced 1 mm. Electronically Signed   By: Nelson Chimes M.D.   On: 11/04/2021 09:03   DG Chest Port 1 View  Result Date: 11/04/2021 CLINICAL DATA:  Sepsis.  Shortness of breath. EXAM: PORTABLE CHEST 1 VIEW COMPARISON:  Chest CT 03/13/2019 FINDINGS: The cardiac silhouette, mediastinal and hilar contours are within normal limits. Stable mild eventration of the right hemidiaphragm. The right basilar density could reflect an infiltrate or atelectasis. No pleural effusions or pulmonary  lesions. IMPRESSION: Right basilar density could reflect infiltrate or atelectasis. Electronically Signed   By: Marijo Sanes M.D.   On: 11/04/2021 09:03    EKG:EKG today (baseline) shows NSR in 80s with narrow QRS (personally reviewed)  TELEMETRY: Currently in SVT/AF RVR in the 160-170s, had been NSR for a large part of the day, after an episode of sustained AF RVR this am. (personally reviewed)  Assessment/Plan: 1.  AF RVR 2. SVT Narrow complex tachycardia with long periods of irregularly irregular rhythm mostly consistent with AF RVR Given her co-morbidities, best AAD option is Amiodarone. A cursory review does not seem to indicate this would interfere with her Tardive dyskinesia.  Her diltiazem has not been started yet, so alternatively could start this and gauge response over next several hours, and then start amiodarone if needed. Given her relatively immobility, despite CHA2DS2/VASc of 1 and overall short episodes, would plan anticoagulation on the short term, which could likely be stopped if her rhythm remains stable outside of her acute issues.  She has a long history of falls and is a poor candidate for chronic anticoagulation.  All above reviewed with Dr. Terri Skains by Dr. Curt Bears  3. Sepsis due to CAP Management per primary team Overall improving Blood cultures NGTD  4. Mental Retardation 5. Tardive Dyskinesia 6. Significant Mood disorders Per primary team Not a candidate for advanced EP procedures.   7. R index prox phalanx fx Plan non-operative management per Ortho.  Dr. Ileana Ladd has seen and passed along recommendation directly to Dr. Terri Skains. Would plan at least short term anticoagulation.  If AAD is required after diltiazem trial, amiodarone is her only good option.      For questions or updates, please contact [place] Please consult [email or web address] for contact info under Cardiology/STEMI.  Signed, Shirley Friar, PA-C  11/05/2021 3:35 PM   I have seen  and examined this patient with Oda Kilts.  Agree with above, note added to reflect my findings.  She has a history significant for mental retardation, frequent falls, tardive dyskinesia, CKD stage III.  She presented to the hospital and was found to have pneumonia.  She is currently on antibiotics.  She went into both SVT and rapid atrial fibrillation.  Review of telemetry shows mainly atrial fibrillation.  Her heart rates are in the 170s to 180s.  She is currently minimally symptomatic.  GEN: Well nourished, well developed, in no acute distress  HEENT: normal  Neck: no JVD, carotid bruits, or masses Cardiac: Tachycardic, irregular; no murmurs, rubs, or gallops,no edema  Respiratory:  clear to auscultation bilaterally, normal work of breathing GI: soft, nontender, nondistended, + BS MS: no deformity or atrophy  Skin: warm and dry Neuro:  Strength and sensation are intact Psych: euthymic mood, full affect   Atrial fibrillation with rapid response: Agree with anticoagulation.  This could be short-term.  Atrial fibrillation could possibly be due to her acute illness.  Would start her on rate control with diltiazem and agree with amiodarone.  Would load with IV amiodarone and switch her to p.o. amiodarone when she has maintained sinus rhythm.  Hopefully this Sajid Ruppert be a short course.  We Remon Quinto continue amiodarone for a total of 3 months and stop amiodarone should she maintain sinus rhythm.  With her frequent falls, hopefully anticoagulation Janis Cuffe be short-term as well.  Siraj Dermody M. Nour Rodrigues MD 11/06/2021 7:42 AM

## 2021-11-06 ENCOUNTER — Encounter (HOSPITAL_COMMUNITY): Payer: Self-pay | Admitting: Certified Registered"

## 2021-11-06 ENCOUNTER — Encounter (HOSPITAL_COMMUNITY): Payer: Self-pay | Admitting: Family Medicine

## 2021-11-06 DIAGNOSIS — D696 Thrombocytopenia, unspecified: Secondary | ICD-10-CM

## 2021-11-06 DIAGNOSIS — J189 Pneumonia, unspecified organism: Secondary | ICD-10-CM | POA: Diagnosis not present

## 2021-11-06 DIAGNOSIS — I4891 Unspecified atrial fibrillation: Secondary | ICD-10-CM | POA: Diagnosis not present

## 2021-11-06 DIAGNOSIS — I471 Supraventricular tachycardia: Secondary | ICD-10-CM | POA: Diagnosis not present

## 2021-11-06 LAB — BASIC METABOLIC PANEL
Anion gap: 10 (ref 5–15)
BUN: 13 mg/dL (ref 6–20)
CO2: 27 mmol/L (ref 22–32)
Calcium: 7.7 mg/dL — ABNORMAL LOW (ref 8.9–10.3)
Chloride: 103 mmol/L (ref 98–111)
Creatinine, Ser: 0.91 mg/dL (ref 0.44–1.00)
GFR, Estimated: >60 mL/min (ref >60)
Glucose, Bld: 115 mg/dL — ABNORMAL HIGH (ref 70–99)
Potassium: 4 mmol/L (ref 3.5–5.1)
Sodium: 140 mmol/L (ref 135–145)

## 2021-11-06 LAB — CBC WITH DIFFERENTIAL/PLATELET
Abs Immature Granulocytes: 0.09 10*3/uL — ABNORMAL HIGH (ref 0.00–0.07)
Basophils Absolute: 0 10*3/uL (ref 0.0–0.1)
Basophils Relative: 0 %
Eosinophils Absolute: 0 10*3/uL (ref 0.0–0.5)
Eosinophils Relative: 0 %
HCT: 30.5 % — ABNORMAL LOW (ref 36.0–46.0)
Hemoglobin: 9.9 g/dL — ABNORMAL LOW (ref 12.0–15.0)
Immature Granulocytes: 1 %
Lymphocytes Relative: 33 %
Lymphs Abs: 3.3 10*3/uL (ref 0.7–4.0)
MCH: 32.4 pg (ref 26.0–34.0)
MCHC: 32.5 g/dL (ref 30.0–36.0)
MCV: 99.7 fL (ref 80.0–100.0)
Monocytes Absolute: 1 10*3/uL (ref 0.1–1.0)
Monocytes Relative: 10 %
Neutro Abs: 5.8 10*3/uL (ref 1.7–7.7)
Neutrophils Relative %: 56 %
Platelets: 50 10*3/uL — ABNORMAL LOW (ref 150–400)
RBC: 3.06 MIL/uL — ABNORMAL LOW (ref 3.87–5.11)
RDW: 14.6 % (ref 11.5–15.5)
WBC: 10.2 10*3/uL (ref 4.0–10.5)
nRBC: 0.2 % (ref 0.0–0.2)

## 2021-11-06 LAB — HEPARIN LEVEL (UNFRACTIONATED)
Heparin Unfractionated: <0.1 IU/mL — ABNORMAL LOW (ref 0.30–0.70)
Heparin Unfractionated: 0.2 IU/mL — ABNORMAL LOW (ref 0.30–0.70)
Heparin Unfractionated: 0.25 IU/mL — ABNORMAL LOW (ref 0.30–0.70)

## 2021-11-06 LAB — LEGIONELLA PNEUMOPHILA SEROGP 1 UR AG
L. pneumophila Serogp 1 Ur Ag: NEGATIVE
L. pneumophila Serogp 1 Ur Ag: NEGATIVE

## 2021-11-06 LAB — MAGNESIUM: Magnesium: 1.8 mg/dL (ref 1.7–2.4)

## 2021-11-06 MED ORDER — MAGNESIUM SULFATE 2 GM/50ML IV SOLN
2.0000 g | Freq: Once | INTRAVENOUS | Status: AC
  Administered 2021-11-06: 2 g via INTRAVENOUS
  Filled 2021-11-06: qty 50

## 2021-11-06 MED ORDER — HEPARIN BOLUS VIA INFUSION
1500.0000 [IU] | Freq: Once | INTRAVENOUS | Status: AC
  Administered 2021-11-06: 1500 [IU] via INTRAVENOUS
  Filled 2021-11-06: qty 1500

## 2021-11-06 NOTE — Progress Notes (Signed)
   11/06/21 2032  Assess: MEWS Score  Temp 98.6 F (37 C)  BP 123/82  MAP (mmHg) 96  Pulse Rate (!) 159  ECG Heart Rate (!) 156  Resp 13  Level of Consciousness Alert  SpO2 96 %  O2 Device Room Air  Assess: MEWS Score  MEWS Temp 0  MEWS Systolic 0  MEWS Pulse 3  MEWS RR 1  MEWS LOC 0  MEWS Score 4  MEWS Score Color Red  Assess: if the MEWS score is Yellow or Red  Were vital signs taken at a resting state? Yes  Focused Assessment No change from prior assessment  Does the patient meet 2 or more of the SIRS criteria? No  Does the patient have a confirmed or suspected source of infection? Yes  Provider and Rapid Response Notified? No  MEWS guidelines implemented *See Row Information* Yes  Treat  Pain Scale 0-10  Pain Score 0  Take Vital Signs  Increase Vital Sign Frequency  Red: Q 1hr X 4 then Q 4hr X 4, if remains red, continue Q 4hrs  Escalate  MEWS: Escalate Red: discuss with charge nurse/RN and provider, consider discussing with RRT (Pt has orders in place MD at bedside for a follow up visit with pt at this time as well. Pt has no  complaints at this time. HR still in 150's-160's with IV drips Cardizem and Amiodarone maxed out.)  Notify: Charge Nurse/RN  Name of Charge Nurse/RN Notified Deborah, RN  Date Charge Nurse/RN Notified 11/06/21  Time Charge Nurse/RN Notified 2043  Notify: Provider  Date Provider Notified 11/06/21  Time Provider Notified 2040  Method of Notification Rounds  Notification Reason Other (Comment) (MD came to visit with pt at this time to follow up with pt's current status.)  Provider response At bedside  Date of Provider Response 11/06/21  Time of Provider Response 2040  Assess: SIRS CRITERIA  SIRS Temperature  0  SIRS Pulse 1  SIRS Respirations  0  SIRS WBC 0  SIRS Score Sum  1

## 2021-11-06 NOTE — Progress Notes (Signed)
ANTICOAGULATION CONSULT NOTE - Initial Consult  Pharmacy Consult for heparin Indication: atrial fibrillation  No Known Allergies  Patient Measurements: Height: '4\' 6"'$  (137.2 cm) Weight: 56.1 kg (123 lb 10.9 oz) IBW/kg (Calculated) : 31.7 Heparin Dosing Weight: 45.7 kg  Vital Signs: BP: 112/68 (06/15 1305) Pulse Rate: 152 (06/15 1305)  Labs: Recent Labs    11/04/21 0826 11/05/21 0111 11/06/21 0359 11/06/21 1217 11/06/21 1930  HGB 10.1* 9.0* 9.9*  --   --   HCT 30.4* 27.9* 30.5*  --   --   PLT 51* 38* 50*  --   --   APTT 34  --   --   --   --   LABPROT 18.3*  --   --   --   --   INR 1.5*  --   --   --   --   HEPARINUNFRC  --   --  <0.10* 0.20* 0.25*  CREATININE 1.35* 1.04* 0.91  --   --      Estimated Creatinine Clearance: 44.1 mL/min (by C-G formula based on SCr of 0.91 mg/dL).  Medical History: Past Medical History:  Diagnosis Date   Anemia    Bipolar 2 disorder ([place]) 05/14/2012   Chronic kidney disease    stage 3   Chronic kidney disease, stage 3 ([place]) 01/04/2015   Encephalopathy 01/04/2015   Fall    Heart murmur    stable for yrs-from childhood.   Hypothyroidism 05/14/2012   Intellectual disability 05/14/2012   Mental retardation    Monteggia's fracture 03/31/2019   Osteoporosis    Schizophrenia ([place])    Seizures ([place])    last one > 5 yrs   Tardive dyskinesia    Thrombocytopenic disorder ([place]) 05/06/2020   Tinea pedis 11/04/2021   Venous stasis syndrome 11/04/2021    Medications:  Infusions:   amiodarone 30 mg/hr (11/06/21 1814)   cefTRIAXone (ROCEPHIN)  IV Stopped (11/06/21 0945)   diltiazem (CARDIZEM) infusion 15 mg/hr (11/06/21 1929)   heparin 1,000 Units/hr (11/06/21 1927)    Assessment: 56 yo F presents with atrial fibrillation and PMH of chronic thrombocytopenia (baseline ~50) likely secondary to valproate. Not on anticoagulation prior to admission. Pharmacy consulted for heparin dosing.  Heparin level 0.2 is subtherapeutic after 1500 unit  bolus and rate increase to 900 units/hr.  CBC stable - Hgb 9.9, Plt 50.  HL increase to 0.25 but still subtherapeutic. We will increase rate and check level in AM. Plt remains around 50k.  Goal of Therapy:  Heparin level 0.3-0.7 units/ml Monitor platelets by anticoagulation protocol: Yes   Plan:  Increase heparin infusion to 1150 units/hr. Heparin level in AM Daily CBC, heparin level Monitor for s/sx of bleeding, bruising with h/o thrombocytopenia  Onnie Boer, PharmD, BCIDP, AAHIVP, CPP Infectious Disease Pharmacist 11/06/2021 8:10 PM

## 2021-11-06 NOTE — Progress Notes (Signed)
PT Cancellation Note  Patient Details Name: Diamond Bowman MRN: [id number] DOB: 1966-03-11   Cancelled Treatment:    Reason Eval/Treat Not Completed: Patient not medically ready (pt remains in rapid Afib at [facility])   Bailyn Spackman B Kataya Guimont 11/06/2021, 10:22 AM [place] Office: [phone]

## 2021-11-06 NOTE — Plan of Care (Signed)
  Problem: Clinical Measurements: Goal: Will remain free from infection Outcome: Progressing Goal: Respiratory complications will improve Outcome: Progressing Goal: Cardiovascular complication will be avoided Outcome: Not Progressing   Problem: Nutrition: Goal: Adequate nutrition will be maintained Outcome: Progressing   Problem: Elimination: Goal: Will not experience complications related to urinary retention Outcome: Progressing   Problem: Pain Managment: Goal: General experience of comfort will improve Outcome: Progressing   Problem: Safety: Goal: Ability to remain free from injury will improve Outcome: Progressing   Problem: Skin Integrity: Goal: Risk for impaired skin integrity will decrease Outcome: Progressing

## 2021-11-06 NOTE — Progress Notes (Addendum)
Electrophysiology Rounding Note  Patient Name: Diamond Bowman Date of Encounter: 11/06/2021  Primary Cardiologist: Dr. Terri Skains Electrophysiologist: New   Subjective   Has remained in AF RVR overnight but tolerated from a hemodynamic perspective.   She is pleasant on exam but does not participate in conversation.   Inpatient Medications    Scheduled Meds:  azithromycin  500 mg Oral Daily   divalproex  1,500 mg Oral QHS   docusate sodium  100 mg Oral Daily   hydrocerin   Topical BID   hydrOXYzine  50 mg Oral BID   levothyroxine  75 mcg Oral Daily   mirtazapine  15 mg Oral QHS   OLANZapine zydis  15 mg Oral QHS   traZODone  300 mg Oral QHS   verapamil  40 mg Oral Q8H   Continuous Infusions:  amiodarone 30 mg/hr (11/06/21 0652)   cefTRIAXone (ROCEPHIN)  IV     diltiazem (CARDIZEM) infusion 15 mg/hr (11/06/21 0224)   heparin 900 Units/hr (11/06/21 0549)   PRN Meds:    Vital Signs    Vitals:   11/05/21 2022 11/06/21 0056 11/06/21 0415 11/06/21 0745  BP: 140/78 113/70 (!) 131/101 (!) 103/93  Pulse: (!) 154 (!) 153 (!) 174 (!) 157  Resp: (!) 24 (!) 33 20 18  Temp: 98.4 F (36.9 C) 97.8 F (36.6 C) (!) 97.5 F (36.4 C) 97.7 F (36.5 C)  TempSrc: Oral Oral Oral Oral  SpO2: 94%   96%  Weight:   56.1 kg   Height:        Intake/Output Summary (Last 24 hours) at 11/06/2021 0829 Last data filed at 11/06/2021 0300 Gross per 24 hour  Intake 1162.39 ml  Output 1300 ml  Net -137.61 ml   Filed Weights   11/04/21 0804 11/04/21 1848 11/06/21 0415  Weight: 59.9 kg 55.7 kg 56.1 kg    Physical Exam    GEN- The patient is in bed, NAD. Alert to person and voice.  Lungs- Normal effort Heart- Very tachy and irregular rhythm.  GI- Soft, non-tender, non-distended, bowel sounds present Extremities- no clubbing or cyanosis. No edema.  MS- R hand in splint.  Skin- warm and dry, no rash or lesion Psych- Flat affect.   Labs    CBC Recent Labs    11/04/21 0826  11/05/21 0111 11/06/21 0359  WBC 12.8* 9.4 10.2  NEUTROABS 9.9*  --  5.8  HGB 10.1* 9.0* 9.9*  HCT 30.4* 27.9* 30.5*  MCV 99.3 100.4* 99.7  PLT 51* 38* 50*   Basic Metabolic Panel Recent Labs    11/04/21 0826 11/05/21 0111 11/06/21 0359  NA 138 142 140  K 4.2 3.8 4.0  CL 101 109 103  CO2 '28 28 27  '$ GLUCOSE 122* 131* 115*  BUN 23* 17 13  CREATININE 1.35* 1.04* 0.91  CALCIUM 8.1* 7.7* 7.7*  MG 1.8  --   --    Liver Function Tests Recent Labs    11/04/21 0826  AST 19  ALT 12  ALKPHOS 46  BILITOT 0.7  PROT 5.6*  ALBUMIN 2.3*   No results for input(s): "LIPASE", "AMYLASE" in the last 72 hours. Cardiac Enzymes No results for input(s): "CKTOTAL", "CKMB", "CKMBINDEX", "TROPONINI" in the last 72 hours.   Telemetry    AF vs AFL with RVR in 150-170s overnight (personally reviewed)  Radiology    DG Chest 2 View  Result Date: 11/05/2021 CLINICAL DATA:  Community-acquired pneumonia. EXAM: CHEST - 2 VIEW COMPARISON:  Portable chest  yesterday at 8:35 a.m. FINDINGS: AP Lat chest at 4:39 a.m., 11/05/2021. The patient is obliquely rotated to the right. There is increased opacity in the left infrahilar area which could be atelectasis or a small infiltrate. It is not well localized on the lateral view. No pleural effusion is seen. The remaining lungs clear with COPD. There is aortic atherosclerosis with stable mediastinum. The cardiac size is normal. Thoracic spondylosis and bridging enthesopathy. IMPRESSION: Left infrahilar opacity which could be atelectasis or small developing pneumonia. COPD. Rotation to the right limits evaluation of the right lower lung field. Electronically Signed   By: Telford Nab M.D.   On: 11/05/2021 06:34   ECHOCARDIOGRAM COMPLETE  Result Date: 11/04/2021    ECHOCARDIOGRAM REPORT   Patient Name:   Diamond Bowman Date of Exam: 11/04/2021 Medical Rec #:  [id number]       Height:       54.0 in Accession #:    [id number]      Weight:       132.1 lb Date of  Birth:  09-22-1965        BSA:          1.450 m Patient Age:    56 years        BP:           116/47 mmHg Patient Gender: F               HR:           85-171 bpm. Exam Location:  Inpatient Procedure: 2D Echo, Cardiac Doppler and Color Doppler Indications:     Dyspnea  History:         Patient has no prior history of Echocardiogram examinations.                  Arrythmias:Tachycardia; Signs/Symptoms:Hypotension. PNA with                  severe sepsis.  Sonographer:     Merrie Roof RDCS Referring Phys:  [id number] MADISON KOMMOR Diagnosing Phys: Rex Kras DO  Sonographer Comments: Suboptimal parasternal window and suboptimal subcostal window. IMPRESSIONS  1. Left ventricular ejection fraction, by estimation, is 55 to 60%. The left ventricle has normal function. The left ventricle has no regional wall motion abnormalities. Left ventricular diastolic parameters were normal.  2. Right ventricular systolic function is normal. The right ventricular size is normal.  3. The mitral valve is grossly normal. No evidence of mitral valve regurgitation. No evidence of mitral stenosis.  4. The aortic valve was not well visualized. Aortic valve regurgitation is not visualized. No aortic stenosis is present.  5. Pulmonic valve regurgitation not well visualized. FINDINGS  Left Ventricle: Left ventricular ejection fraction, by estimation, is 55 to 60%. The left ventricle has normal function. The left ventricle has no regional wall motion abnormalities. The left ventricular internal cavity size was normal in size. There is  no left ventricular hypertrophy. Left ventricular diastolic parameters were normal. Right Ventricle: The right ventricular size is normal. No increase in right ventricular wall thickness. Right ventricular systolic function is normal. Left Atrium: Left atrial size was normal in size. Right Atrium: Right atrial size was not well visualized. Pericardium: There is no evidence of pericardial effusion. Mitral Valve: The  mitral valve is grossly normal. No evidence of mitral valve regurgitation. No evidence of mitral valve stenosis. Tricuspid Valve: The tricuspid valve is grossly normal. Tricuspid valve regurgitation is trivial. No evidence of tricuspid stenosis.  Aortic Valve: The aortic valve was not well visualized. Aortic valve regurgitation is not visualized. No aortic stenosis is present. Aortic valve mean gradient measures 6.0 mmHg. Aortic valve peak gradient measures 9.4 mmHg. Aortic valve area, by VTI measures 1.71 cm. Pulmonic Valve: The pulmonic valve was not well visualized. Pulmonic valve regurgitation not well visualized. Aorta: The aortic root was not well visualized. IAS/Shunts: The interatrial septum was not well visualized.  LEFT VENTRICLE PLAX 2D LVIDd:         3.50 cm   Diastology LVIDs:         2.60 cm   LV e' medial:    6.96 cm/s LV PW:         1.00 cm   LV E/e' medial:  14.8 LV IVS:        0.80 cm   LV e' lateral:   7.83 cm/s LVOT diam:     2.00 cm   LV E/e' lateral: 13.2 LV SV:         56 LV SV Index:   39 LVOT Area:     3.14 cm  RIGHT VENTRICLE RV Basal diam:  3.40 cm RV S prime:     13.50 cm/s TAPSE (M-mode): 1.7 cm LEFT ATRIUM             Index        RIGHT ATRIUM           Index LA diam:        2.50 cm 1.72 cm/m   RA Area:     16.40 cm LA Vol (A2C):   40.2 ml 27.73 ml/m  RA Volume:   43.80 ml  30.21 ml/m LA Vol (A4C):   37.1 ml 25.59 ml/m LA Biplane Vol: 40.3 ml 27.79 ml/m  AORTIC VALVE AV Area (Vmax):    1.93 cm AV Area (Vmean):   1.59 cm AV Area (VTI):     1.71 cm AV Vmax:           153.00 cm/s AV Vmean:          117.000 cm/s AV VTI:            0.329 m AV Peak Grad:      9.4 mmHg AV Mean Grad:      6.0 mmHg LVOT Vmax:         93.80 cm/s LVOT Vmean:        59.400 cm/s LVOT VTI:          0.179 m LVOT/AV VTI ratio: 0.54  AORTA Ao Root diam: 2.10 cm MITRAL VALVE MV Area (PHT): 4.63 cm     SHUNTS MV Decel Time: 164 msec     Systemic VTI:  0.18 m MV E velocity: 103.00 cm/s  Systemic Diam: 2.00 cm MV  A velocity: 93.10 cm/s MV E/A ratio:  1.11 Sunit Tolia DO Electronically signed by Rex Kras DO Signature Date/Time: 11/04/2021/5:51:05 PM    Final    DG Hand Complete Right  Result Date: 11/04/2021 CLINICAL DATA:  Rule out fracture. Broken finger from previous injury. EXAM: RIGHT HAND - COMPLETE 3+ VIEW COMPARISON:  None FINDINGS: There is an acute fracture at the base of the proximal phalanx of the index finger, displaced perhaps 1 mm. Fracture extends to the articular surface. No other regional finding. IMPRESSION: Fracture at the base of the proximal phalanx of the index finger with intra-articular extension. Displaced 1 mm. Electronically Signed   By: Nelson Chimes M.D.   On:  11/04/2021 09:03   DG Chest Port 1 View  Result Date: 11/04/2021 CLINICAL DATA:  Sepsis.  Shortness of breath. EXAM: PORTABLE CHEST 1 VIEW COMPARISON:  Chest CT 03/13/2019 FINDINGS: The cardiac silhouette, mediastinal and hilar contours are within normal limits. Stable mild eventration of the right hemidiaphragm. The right basilar density could reflect an infiltrate or atelectasis. No pleural effusions or pulmonary lesions. IMPRESSION: Right basilar density could reflect infiltrate or atelectasis. Electronically Signed   By: Marijo Sanes M.D.   On: 11/04/2021 09:03    Patient Profile     Diamond Bowman is a 56 y.o. female with a history of mental retardation, frequent falls, tardive dyskinesia, and CKD III who is being seen today for the evaluation of SVT at the request of Dr. Terri Skains.  Assessment & Plan    1.  AF/AFL RVR 2. SVT Narrow complex tachycardia with long periods of irregularly irregular rhythm mostly consistent with AF RVR Continue IV amiodarone Continue IV diltiazem.  She Denzal Meir likely required cardioversion. Given has been <48 hours, this can occur without TEE up to tomorrow afternoon.  Given her relatively immobility, despite CHA2DS2/VASc of 1 and overall short episodes, would plan anticoagulation at least  on the short term, which could likely be stopped if her rhythm remains stable outside of her acute issues.  She has a long history of falls and is a poor candidate for chronic anticoagulation.    3. Sepsis due to CAP Per primary team Blood cultures NGTD   4. Mental Retardation 5. Tardive Dyskinesia 6. Significant Mood disorders Per primary team Not a candidate for advanced EP procedures.    7. R index prox phalanx fx Plan non-operative management per Ortho.   We have recommended cardioversion which is scheduled for this afternoon and has been discussed with family who has given permission to proceed.   For questions or updates, please contact [place] Please consult [email or web address] for contact info under Cardiology/STEMI.  Signed, Shirley Friar, PA-C  11/06/2021, 8:29 AM   I have seen and examined this patient with Oda Kilts.  Agree with above, note added to reflect my findings.  Patient without complaint  GEN: Well nourished, well developed, in no acute distress  HEENT: normal  Neck: no JVD, carotid bruits, or masses Cardiac: Tachycardic, regular; no murmurs, rubs, or gallops,no edema  Respiratory:  clear to auscultation bilaterally, normal work of breathing GI: soft, nontender, nondistended, + BS MS: no deformity or atrophy  Skin: warm and dry,  Neuro:  Strength and sensation are intact Psych: euthymic mood, full affect   Atrial fibrillation/flutter: Patient is currently on amiodarone, diltiazem, heparin.  Hopefully these Julee Stoll be short-term medications.  She is converted from atrial fibrillation to atrial flutter.  She has had heparin since the start of her arrhythmia, and has been on amiodarone, we Dniyah Grant plan for cardioversion.  Hopefully she Tahjae Clausing stay in normal rhythm with IV amiodarone.  We Dayanna Pryce continue IV amiodarone for at least 24 hours post cardioversion.  At that point, we Milliani Herrada transition to 400 mg twice daily for 2 weeks followed by 200 mg daily.  She  Dejan Angert need anticoagulation at discharge, but hopefully both amiodarone and anticoagulation with Eliquis or Xarelto Enzo Treu be a short-term option.  Hendrix Console M. Metta Koranda MD 11/06/2021 10:30 AM

## 2021-11-06 NOTE — Progress Notes (Signed)
Progress Note  Patient Name: Diamond Bowman Date of Encounter: 11/06/2021  Attending physician: McDiarmid, Blane Ohara, MD Primary care provider: Everardo Beals, NP  Subjective: Diamond Bowman is a 56 y.o. female who was seen and examined at bedside. Awake and pleasant but not carrying out a conversation.  RN called yesterday that she was max dose of cardizem drip and remained in RVR.  Amiodarone was started yesterday and care plan discussed over the phone w/ primary team resident overnight and staff  No family noted at bedside.   Objective: Vital Signs in the last 24 hours: Temp:  [97.5 F (36.4 C)-98.4 F (36.9 C)] 97.7 F (36.5 C) (06/15 0745) Pulse Rate:  [88-174] 157 (06/15 0745) Resp:  [18-33] 18 (06/15 0745) BP: (103-159)/(69-101) 103/93 (06/15 0745) SpO2:  [94 %-100 %] 96 % (06/15 0745) Weight:  [56.1 kg] 56.1 kg (06/15 0415)  Intake/Output:  Intake/Output Summary (Last 24 hours) at 11/06/2021 1216 Last data filed at 11/06/2021 0700 Gross per 24 hour  Intake 836.22 ml  Output 1000 ml  Net -163.78 ml    Net IO Since Admission: 659.83 mL [11/06/21 1216]  Weights:  Filed Weights   11/04/21 0804 11/04/21 1848 11/06/21 0415  Weight: 59.9 kg 55.7 kg 56.1 kg    Telemetry: Personally reviewed. Narrow complex tachycardia (Afib and episodes of atrial flutter).   Physical examination: PHYSICAL EXAM:    11/06/2021    7:45 AM 11/06/2021    4:15 AM 11/06/2021   12:56 AM  Vitals with BMI  Weight  123 lbs 11 oz   BMI  40.1   Systolic 027 253 664  Diastolic 93 403 70  Pulse 157 174 153    CONSTITUTIONAL: ill appearing, lying in bed, no acute distress, tachycardic, appears older than stated age SKIN: Skin is warm and dry. No rash noted. No cyanosis. No pallor. No jaundice HEAD: Normocephalic and atraumatic.  EYES: No scleral icterus MOUTH/THROAT: Moist oral membranes.  NECK: No JVD present. No thyromegaly noted. No carotid bruits  CHEST Normal respiratory effort.  No intercostal retractions  LUNGS decreased breath sounds at the bases, kyphosis noted posteriorly CARDIOVASCULAR: Tachycardic, irregularly irregular, variable S1-S2, no murmurs rubs or gallops.  Tachycardia. ABDOMINAL: Soft, nontender, nondistended, positive bowel sounds in all 4 quadrants, no apparent ascites.  EXTREMITIES: No pitting edema, warm to touch, 2+ bilateral DP and PT pulses HEMATOLOGIC: No significant bruising No change in physical examination since yesterday.   Lab Results: Hematology Recent Labs  Lab 11/04/21 0826 11/05/21 0111 11/06/21 0359  WBC 12.8* 9.4 10.2  RBC 3.06* 2.78* 3.06*  HGB 10.1* 9.0* 9.9*  HCT 30.4* 27.9* 30.5*  MCV 99.3 100.4* 99.7  MCH 33.0 32.4 32.4  MCHC 33.2 32.3 32.5  RDW 14.6 14.6 14.6  PLT 51* 38* 50*    Chemistry Recent Labs  Lab 11/04/21 0826 11/05/21 0111 11/06/21 0359  NA 138 142 140  K 4.2 3.8 4.0  CL 101 109 103  CO2 '28 28 27  '$ GLUCOSE 122* 131* 115*  BUN 23* 17 13  CREATININE 1.35* 1.04* 0.91  CALCIUM 8.1* 7.7* 7.7*  PROT 5.6*  --   --   ALBUMIN 2.3*  --   --   AST 19  --   --   ALT 12  --   --   ALKPHOS 46  --   --   BILITOT 0.7  --   --   GFRNONAA 46* >60 >60  ANIONGAP 9 5 10  Cardiac Enzymes: Cardiac Panel (last 3 results) No results for input(s): "CKTOTAL", "CKMB", "TROPONINIHS", "RELINDX" in the last 72 hours.  BNP (last 3 results) No results for input(s): "BNP" in the last 8760 hours.  ProBNP (last 3 results) No results for input(s): "PROBNP" in the last 8760 hours.   DDimer No results for input(s): "DDIMER" in the last 168 hours.   Hemoglobin A1c:  Lab Results  Component Value Date   HGBA1C 5.2 11/05/2021   MPG 102.54 11/05/2021    TSH  Recent Labs    11/04/21 0826  TSH 1.312    Lipid Panel No results found for: "CHOL", "TRIG", "HDL", "CHOLHDL", "VLDL", "LDLCALC", "LDLDIRECT"  Imaging: DG Chest 2 View  Result Date: 11/05/2021 CLINICAL DATA:  Community-acquired pneumonia. EXAM:  CHEST - 2 VIEW COMPARISON:  Portable chest yesterday at 8:35 a.m. FINDINGS: AP Lat chest at 4:39 a.m., 11/05/2021. The patient is obliquely rotated to the right. There is increased opacity in the left infrahilar area which could be atelectasis or a small infiltrate. It is not well localized on the lateral view. No pleural effusion is seen. The remaining lungs clear with COPD. There is aortic atherosclerosis with stable mediastinum. The cardiac size is normal. Thoracic spondylosis and bridging enthesopathy. IMPRESSION: Left infrahilar opacity which could be atelectasis or small developing pneumonia. COPD. Rotation to the right limits evaluation of the right lower lung field. Electronically Signed   By: Telford Nab M.D.   On: 11/05/2021 06:34   ECHOCARDIOGRAM COMPLETE  Result Date: 11/04/2021    ECHOCARDIOGRAM REPORT   Patient Name:   Diamond Bowman Date of Exam: 11/04/2021 Medical Rec #:  [id number]       Height:       54.0 in Accession #:    [id number]      Weight:       132.1 lb Date of Birth:  1965-12-29        BSA:          1.450 m Patient Age:    49 years        BP:           116/47 mmHg Patient Gender: F               HR:           85-171 bpm. Exam Location:  Inpatient Procedure: 2D Echo, Cardiac Doppler and Color Doppler Indications:     Dyspnea  History:         Patient has no prior history of Echocardiogram examinations.                  Arrythmias:Tachycardia; Signs/Symptoms:Hypotension. PNA with                  severe sepsis.  Sonographer:     Merrie Roof RDCS Referring Phys:  [id number] MADISON KOMMOR Diagnosing Phys: Rex Kras DO  Sonographer Comments: Suboptimal parasternal window and suboptimal subcostal window. IMPRESSIONS  1. Left ventricular ejection fraction, by estimation, is 55 to 60%. The left ventricle has normal function. The left ventricle has no regional wall motion abnormalities. Left ventricular diastolic parameters were normal.  2. Right ventricular systolic function is normal. The  right ventricular size is normal.  3. The mitral valve is grossly normal. No evidence of mitral valve regurgitation. No evidence of mitral stenosis.  4. The aortic valve was not well visualized. Aortic valve regurgitation is not visualized. No aortic stenosis is present.  5. Pulmonic valve regurgitation not  well visualized. FINDINGS  Left Ventricle: Left ventricular ejection fraction, by estimation, is 55 to 60%. The left ventricle has normal function. The left ventricle has no regional wall motion abnormalities. The left ventricular internal cavity size was normal in size. There is  no left ventricular hypertrophy. Left ventricular diastolic parameters were normal. Right Ventricle: The right ventricular size is normal. No increase in right ventricular wall thickness. Right ventricular systolic function is normal. Left Atrium: Left atrial size was normal in size. Right Atrium: Right atrial size was not well visualized. Pericardium: There is no evidence of pericardial effusion. Mitral Valve: The mitral valve is grossly normal. No evidence of mitral valve regurgitation. No evidence of mitral valve stenosis. Tricuspid Valve: The tricuspid valve is grossly normal. Tricuspid valve regurgitation is trivial. No evidence of tricuspid stenosis. Aortic Valve: The aortic valve was not well visualized. Aortic valve regurgitation is not visualized. No aortic stenosis is present. Aortic valve mean gradient measures 6.0 mmHg. Aortic valve peak gradient measures 9.4 mmHg. Aortic valve area, by VTI measures 1.71 cm. Pulmonic Valve: The pulmonic valve was not well visualized. Pulmonic valve regurgitation not well visualized. Aorta: The aortic root was not well visualized. IAS/Shunts: The interatrial septum was not well visualized.  LEFT VENTRICLE PLAX 2D LVIDd:         3.50 cm   Diastology LVIDs:         2.60 cm   LV e' medial:    6.96 cm/s LV PW:         1.00 cm   LV E/e' medial:  14.8 LV IVS:        0.80 cm   LV e' lateral:   7.83  cm/s LVOT diam:     2.00 cm   LV E/e' lateral: 13.2 LV SV:         56 LV SV Index:   39 LVOT Area:     3.14 cm  RIGHT VENTRICLE RV Basal diam:  3.40 cm RV S prime:     13.50 cm/s TAPSE (M-mode): 1.7 cm LEFT ATRIUM             Index        RIGHT ATRIUM           Index LA diam:        2.50 cm 1.72 cm/m   RA Area:     16.40 cm LA Vol (A2C):   40.2 ml 27.73 ml/m  RA Volume:   43.80 ml  30.21 ml/m LA Vol (A4C):   37.1 ml 25.59 ml/m LA Biplane Vol: 40.3 ml 27.79 ml/m  AORTIC VALVE AV Area (Vmax):    1.93 cm AV Area (Vmean):   1.59 cm AV Area (VTI):     1.71 cm AV Vmax:           153.00 cm/s AV Vmean:          117.000 cm/s AV VTI:            0.329 m AV Peak Grad:      9.4 mmHg AV Mean Grad:      6.0 mmHg LVOT Vmax:         93.80 cm/s LVOT Vmean:        59.400 cm/s LVOT VTI:          0.179 m LVOT/AV VTI ratio: 0.54  AORTA Ao Root diam: 2.10 cm MITRAL VALVE MV Area (PHT): 4.63 cm     SHUNTS MV Decel Time: 164 msec  Systemic VTI:  0.18 m MV E velocity: 103.00 cm/s  Systemic Diam: 2.00 cm MV A velocity: 93.10 cm/s MV E/A ratio:  1.11 Diamond Depasquale DO Electronically signed by Rex Kras DO Signature Date/Time: 11/04/2021/5:51:05 PM    Final     CARDIAC DATABASE: EKG: 11/04/2021: Sinus tachycardia, 164 bpm with PACs and PVCs.   11/04/2021 9:31 AM: Narrow complex tachycardia likely atrial tach with frequent PACs, 181 bpm, ST-T changes likely rate related but ischemia cannot be ruled out.   11/04/2021 12:56 PM: Sinus rhythm, 66 bpm, frequent PACs with atrial bigeminy, without underlying injury pattern.     Echocardiogram: 11/04/2021  1. Left ventricular ejection fraction, by estimation, is 55 to 60%. The  left ventricle has normal function. The left ventricle has no regional  wall motion abnormalities. Left ventricular diastolic parameters were  normal.   2. Right ventricular systolic function is normal. The right ventricular  size is normal.   3. The mitral valve is grossly normal. No evidence of mitral  valve  regurgitation. No evidence of mitral stenosis.   4. The aortic valve was not well visualized. Aortic valve regurgitation  is not visualized. No aortic stenosis is present.   5. Pulmonic valve regurgitation not well visualized.   Scheduled Meds:  divalproex  1,500 mg Oral QHS   docusate sodium  100 mg Oral Daily   hydrocerin   Topical BID   hydrOXYzine  50 mg Oral BID   levothyroxine  75 mcg Oral Daily   mirtazapine  15 mg Oral QHS   OLANZapine zydis  15 mg Oral QHS   traZODone  300 mg Oral QHS   verapamil  40 mg Oral Q8H    Continuous Infusions:  amiodarone 30 mg/hr (11/06/21 0700)   cefTRIAXone (ROCEPHIN)  IV 1 g (11/06/21 0907)   diltiazem (CARDIZEM) infusion 15 mg/hr (11/06/21 0956)   heparin 900 Units/hr (11/06/21 0700)   magnesium sulfate bolus IVPB      PRN Meds:    IMPRESSION & RECOMMENDATIONS: DEOLINDA FRID is a 56 y.o. Caucasian female whose past medical history and cardiac risk factors include: Documented history of mental retardation, schizophrenia, tardive dyskinesia, history of falls and fractures, thrombocytopenia, seizures, poor historian.  Impression: Narrow complex tachycardia (predominantly A-fib with RVR, Paroxysmal AFL, Paroxysmal episodes of atrial tachycardia along with normal sinus with atrial bigeminy) Severe sepsis likely due to pneumonia. Protein calorie malnutrition. Documented history of mental retardation. History of tardive  dyskinesia. History of fall/fractures. Thrombocytopenia. Seizures  Plan: Atrial fibrillation with rapid ventricular rate Initially having short episodes of Afib on arrival.  Has remained in Afib since yesterday despite being on IVCardizem drip, IV Amiodarone drip, and oral verapamil.  EP recommendations greatly appreciated, plans on DCCV later today.  Agree with short course of oral amiodarone and [place] s/p cardioversion. If she remains in NSR thereafter this can be medically managed, will need outpatient follow.   Given her low CHA2DS2VASC Score and risk of fall / injury not a candidate for long term [place].  Continue heparin drip for now for thromboembolic prophylaxis. CHA2DS2-VASc SCORE is 1 which correlates to 1.3 % risk of stroke per year (gender).  TSH within normal limits. Echo: Preserved LVEF, normal diastolic function, no significant valvular heart disease. Continue telemetry  Severe sepsis likely due to pneumonia: Management per primary team.  Thrombocytopenia: Present on arrival. See labs above.  We will defer management to primary team Monitor for bleeding.  This note was created using a voice recognition  software as a result there may be grammatical errors inadvertently enclosed that do not reflect the nature of this encounter. Every attempt is made to correct such errors.  Mechele [facility]  Pager: [phone] Office: [phone] 11/06/2021, 12:16 PM

## 2021-11-06 NOTE — Progress Notes (Incomplete)
Electrophysiology Rounding Note  Patient Name: Diamond Bowman Date of Encounter: 11/06/2021 ***  Primary Cardiologist: Dr. Terri Skains Electrophysiologist: New   Subjective   ***  [facility] was unfortunately deferred due to chocolate milk for breakfast ( 8 hr delay) and apple sauce with medications later in the morning.   Inpatient Medications    Scheduled Meds:  divalproex  1,500 mg Oral QHS   docusate sodium  100 mg Oral Daily   hydrocerin   Topical BID   hydrOXYzine  50 mg Oral BID   levothyroxine  75 mcg Oral Daily   mirtazapine  15 mg Oral QHS   OLANZapine zydis  15 mg Oral QHS   traZODone  300 mg Oral QHS   verapamil  40 mg Oral Q8H   Continuous Infusions:  amiodarone 30 mg/hr (11/06/21 1814)   cefTRIAXone (ROCEPHIN)  IV Stopped (11/06/21 0945)   diltiazem (CARDIZEM) infusion 15 mg/hr (11/06/21 1929)   heparin 1,150 Units/hr (11/06/21 2040)   PRN Meds:    Vital Signs    Vitals:   11/06/21 0745 11/06/21 1305 11/06/21 2032 11/06/21 2053  BP: (!) 103/93 112/68 123/82 123/70  Pulse: (!) 157 (!) 152 (!) 159 (!) 156  Resp: '18 20 13 20  '$ Temp: 97.7 F (36.5 C)  98.6 F (37 C) 98.3 F (36.8 C)  TempSrc: Oral  Oral Oral  SpO2: 96% 97% 96% 96%  Weight:      Height:        Intake/Output Summary (Last 24 hours) at 11/06/2021 2200 Last data filed at 11/06/2021 1927 Gross per 24 hour  Intake 928.88 ml  Output 1600 ml  Net -671.12 ml   Filed Weights   11/04/21 0804 11/04/21 1848 11/06/21 0415  Weight: 59.9 kg 55.7 kg 56.1 kg    Physical Exam    GEN- The patient is in bed, NAD. Alert to person and voice.  Lungs- Normal effort Heart- Very tachy and irregular rhythm.  GI- NT, ND, soft  Extremities- no clubbing or cyanosis. No edema.  MS- R hand with finger splint.  Skin- warm and dry, no rash or lesion Psych- flat  Labs    CBC Recent Labs    11/04/21 0826 11/05/21 0111 11/06/21 0359  WBC 12.8* 9.4 10.2  NEUTROABS 9.9*  --  5.8  HGB 10.1* 9.0* 9.9*  HCT  30.4* 27.9* 30.5*  MCV 99.3 100.4* 99.7  PLT 51* 38* 50*   Basic Metabolic Panel Recent Labs    11/04/21 0826 11/05/21 0111 11/06/21 0359  NA 138 142 140  K 4.2 3.8 4.0  CL 101 109 103  CO2 '28 28 27  '$ GLUCOSE 122* 131* 115*  BUN 23* 17 13  CREATININE 1.35* 1.04* 0.91  CALCIUM 8.1* 7.7* 7.7*  MG 1.8  --  1.8   Liver Function Tests Recent Labs    11/04/21 0826  AST 19  ALT 12  ALKPHOS 46  BILITOT 0.7  PROT 5.6*  ALBUMIN 2.3*   No results for input(s): "LIPASE", "AMYLASE" in the last 72 hours. Cardiac Enzymes No results for input(s): "CKTOTAL", "CKMB", "CKMBINDEX", "TROPONINI" in the last 72 hours.   Telemetry    *** (personally reviewed)  Radiology    DG Chest 2 View  Result Date: 11/05/2021 CLINICAL DATA:  Community-acquired pneumonia. EXAM: CHEST - 2 VIEW COMPARISON:  Portable chest yesterday at 8:35 a.m. FINDINGS: AP Lat chest at 4:39 a.m., 11/05/2021. The patient is obliquely rotated to the right. There is increased opacity in the  left infrahilar area which could be atelectasis or a small infiltrate. It is not well localized on the lateral view. No pleural effusion is seen. The remaining lungs clear with COPD. There is aortic atherosclerosis with stable mediastinum. The cardiac size is normal. Thoracic spondylosis and bridging enthesopathy. IMPRESSION: Left infrahilar opacity which could be atelectasis or small developing pneumonia. COPD. Rotation to the right limits evaluation of the right lower lung field. Electronically Signed   By: Telford Nab M.D.   On: 11/05/2021 06:34    Patient Profile     Diamond Bowman is a 56 y.o. female with a history of mental retardation, frequent falls, tardive dyskinesia, and CKD III who is being seen today for the evaluation of SVT at the request of Dr. Terri Skains.    Assessment & Plan    1.  AF/AFL RVR 2. SVT *** overnight Continue IV amiodarone Continue IV diltiazem.  [place] deferred due to apple sauce with pills.  Planned  for this afternoon. NO meds this am if they have to be taken with apple sauce.  She has a long history of falls and is a poor candidate for chronic anticoagulation.    3. Sepsis due to CAP Per primary team Blood cultures NGTD   4. Mental Retardation 5. Tardive Dyskinesia 6. Significant Mood disorders Per primary team Not a candidate for advanced EP procedures.    7. R index prox phalanx fx Non-operative management per Ortho.   For questions or updates, please contact [place] Please consult [email or web address] for contact info under Cardiology/STEMI.  Signed, Shirley Friar, PA-C  11/06/2021, 10:00 PM

## 2021-11-06 NOTE — Progress Notes (Signed)
FPTS Brief Progress Note  S: Patient sleeping, otherwise has been feeling normal, eating snacks and coloring.   O: BP (!) 131/101 (BP Location: Right Arm)   Pulse (!) 174   Temp (!) 97.5 F (36.4 C) (Oral)   Resp (!) 44   Ht '4\' 6"'$  (1.372 m)   Wt 56.1 kg   SpO2 94%   BMI 29.82 kg/m     A/P: Tachydysrhythmia - Patient persistently tachycardic to 160s, however otherwise vitals are stable, she is resting. Continue amio, dilt, verapamil. - Orders reviewed. Labs for AM ordered, which was adjusted as needed.   Gladys Damme, MD 11/06/2021, 4:43 AM PGY-3, [facility] Night Resident  Please page [phone] with questions.

## 2021-11-06 NOTE — Progress Notes (Signed)
     Daily Progress Note Intern Pager: [phone]  Patient name: Diamond Bowman Medical record number: [id number] Date of birth: November 29, 1965 Age: 56 y.o. Gender: female  Primary Care Provider: Everardo Beals, NP Consultants: Orthopedics, cardiology Code Status: Full  Pt Overview and Major Events to Date:  6/13: admitted  Assessment and Plan: [name] is a 56 y.o. female presenting with community-acquired pneumonia meeting sepsis criteria and paroxysmal A-fib.  PMH significant for MDD, tardive dyskinesia, CKD 3, hypothyroidism, schizophrenia, intellectual disability, seizures, and falls. * Community acquired pneumonia WBC and ANC normal. O2 sats remained in upper 90s on room air. -Continue Zithromax 500 mg PO, day 3/3 -Continue Rocephin 1 g IV, day 3/5 -Follow-up blood cultures, NG x2 days -Incentive spirometer every 2 hours while awake  Closed nondisplaced fracture of distal phalanx of right index finger Nonoperative management via splint per orthopedics -Follow-up with orthopedics outpatient in 1 week  Atrial fibrillation ([place]) Originally stated to have narrow complex SVT with frequent PACs.  Poorly controlled: A-fib w/ RVR overnight into the 150s-170s. Mag 1.8, will optimize for cardiac purposes. -Cardiology following, appreciate recommendations -Verapamil '40mg'$  every 8 hours -Cardizem IV -Amiodarone IV -Heparin IV -Mag IV 2g -Cardioversion planned for today  Thrombocytopenic disorder (HCC) Chronic. Platelets 51>38>50. -Heparin IV  FEN/GI: Regular PPx: IV Heparin Dispo:Home with home health pending clinical improvement . Barriers include A-fib control.   Subjective:  Denies chest pain, requests help with sitting up in bed.  Objective: Temp:  [97.5 F (36.4 C)-98.4 F (36.9 C)] 97.7 F (36.5 C) (06/15 0745) Pulse Rate:  [88-174] 157 (06/15 0745) Resp:  [18-33] 18 (06/15 0745) BP: (103-159)/(69-101) 103/93 (06/15 0745) SpO2:  [94 %-100 %] 96 % (06/15  0745) Weight:  [56.1 kg] 56.1 kg (06/15 0415) Physical Exam: General: Awake, alert, NAD Cardiovascular: Irregularly irregular, tachycardic Respiratory: CTA B, normal WOB  Laboratory: Most recent CBC Lab Results  Component Value Date   WBC 10.2 11/06/2021   HGB 9.9 (L) 11/06/2021   HCT 30.5 (L) 11/06/2021   MCV 99.7 11/06/2021   PLT 50 (L) 11/06/2021   Most recent BMP    Latest Ref Rng & Units 11/06/2021    3:59 AM  BMP  Glucose 70 - 99 mg/dL 115   BUN 6 - 20 mg/dL 13   Creatinine 0.44 - 1.00 mg/dL 0.91   Sodium 135 - 145 mmol/L 140   Potassium 3.5 - 5.1 mmol/L 4.0   Chloride 98 - 111 mmol/L 103   CO2 22 - 32 mmol/L 27   Calcium 8.9 - 10.3 mg/dL 7.7    Imaging/Diagnostic Tests: No new imaging results. Wells Guiles, DO 11/06/2021, 11:19 AM  PGY-1, [place] Intern pager: [phone], text pages welcome Secure chat group [place]

## 2021-11-06 NOTE — Progress Notes (Signed)
FPTS Brief Progress Note  S:Went bedside to see patient. Patient laying comfortably at baseline mentation/activity level. Reports she had a good day.    O: BP 123/82 (BP Location: Right Arm)   Pulse (!) 159   Temp 98.6 F (37 C) (Oral)   Resp 13   Ht '4\' 6"'$  (1.372 m)   Wt 56.1 kg   SpO2 96%   BMI 29.82 kg/m   General: NAD, non-toxic appearing, cognitive impairment,  Resp: CTABL, cough Card: tachycardic, NRMG  A/P: Tachyarrhythmia Patient continues to have tachyarrhythmia to 150-160's. Patient remains HDS, with baseline level of mentation/activity. Patient remains on Cardizem gtt, amiodarone gtt, heparin gtt. Per electrophysiology and cardiology notes, patient is to be cardioverted by tomorrow afternoon.  - Orders reviewed. Labs for AM ordered, which was adjusted as needed.   Holley Bouche, MD 11/06/2021, 8:53 PM PGY-1, [place] Night Resident  Please page [phone] with questions.

## 2021-11-06 NOTE — Hospital Course (Addendum)
Diamond Bowman is a 56 y.o.female with a history of MDD, intellectual disability, schizophrenia, hypothyroidism, CKD 3, thrombocytopenia, seizures and falls who was admitted to the [facility] at [facility] for community-acquired pneumonia and A-fib. Her hospital course is detailed below:  Community-acquired pneumonia meeting sepsis criteria Presented tachypneic without hypoxemia.  Elevated WBC to 12.8 and CXR which showed right bibasilar infiltrate versus atelectasis.  Received Zithromax x3 days and ceftriaxone x5 days.  Blood cultures negative.  Remained on room air throughout hospitalization.  A-fib with RVR Cardiology consulted and patient was initially placed on Cardizem drip.  Patient was then started on verapamil and additionally progressed to amiodarone drip and Cardizem drip with anticoagulation via heparin IV.  Remained in A-fib with RVR.  Cardioversion was attempted***  Right index finger phalanx fracture XR showed fracture at the base of the proximal phalanx of the index finger with intra-articular extension.  This was splinted and advised by orthopedics to follow-up outpatient 1 week.  Other chronic conditions were medically managed with home medications and formulary alternatives as necessary (hypothyroidism, schizophrenia, seizure disorder, thrombocytopenia)  PCP Follow-up Recommendations: Follow-up with orthopedics outpatient in 1 week Follow-up with cardiology (Dr. Terri Skains)

## 2021-11-06 NOTE — Progress Notes (Signed)
[place] for Heparin Indication: atrial fibrillation Brief A/P: Heparin level subtherapeutic Increase Heparin rate  No Known Allergies  Patient Measurements: Height: '4\' 6"'$  (137.2 cm) Weight: 56.1 kg (123 lb 10.9 oz) IBW/kg (Calculated) : 31.7 Heparin Dosing Weight: 46 kg  Vital Signs: Temp: 97.5 F (36.4 C) (06/15 0415) Temp Source: Oral (06/15 0415) BP: 131/101 (06/15 0415) Pulse Rate: 174 (06/15 0415)  Labs: Recent Labs    11/04/21 0826 11/05/21 0111 11/06/21 0359  HGB 10.1* 9.0* 9.9*  HCT 30.4* 27.9* 30.5*  PLT 51* 38* 50*  APTT 34  --   --   LABPROT 18.3*  --   --   INR 1.5*  --   --   HEPARINUNFRC  --   --  <0.10*  CREATININE 1.35* 1.04*  --      Estimated Creatinine Clearance: 38.6 mL/min (A) (by C-G formula based on SCr of 1.04 mg/dL (H)).   Assessment: 56 y.o. female with Afib for heparin  Goal of Therapy:  Heparin level 0.3-0.7 units/ml Monitor platelets by anticoagulation protocol: Yes   Plan:  Heparin 1500 units IV bolus, then increase heparin  900 units/hr Check heparin level in 6 hours.   Phillis Knack, PharmD, BCPS  11/06/2021,4:53 AM

## 2021-11-06 NOTE — Progress Notes (Signed)
Occupational Therapy Treatment Patient Details Name: Diamond Bowman MRN: [id number] DOB: November 29, 1965 Today's Date: 11/06/2021   History of present illness Pt is a 56 y.o. female admitted 11/04/21 with cough, SOB, AMS; of note, pt also with recent injury to R index finger sustaining R proximal phalanx fx. Workup for CAP, narrow complex tachycardia. PMH includes bipolar, schizophrenia, mental retardation, tardive dyskinesia, MDD, CKD 3, hypothyroidism.   OT comments  Pt progressing towards established OT goals. Performed splint check; no redness or edema. Pt participating in self feeding task. Educated care coordinator (present at bedside) of splint schedule; wear all times and off for ADLs with skin checks every 2 hours. Continue to recommend dc to home with HHOT. Will continue to follow acutely as admitted.   Recommendations for follow up therapy are one component of a multi-disciplinary discharge planning process, led by the attending physician.  Recommendations may be updated based on patient status, additional functional criteria and insurance authorization.    Follow Up Recommendations  Home health OT    Assistance Recommended at Discharge Frequent or constant Supervision/Assistance  Patient can return home with the following  A little help with walking and/or transfers;A lot of help with bathing/dressing/bathroom;Assistance with cooking/housework;Assistance with feeding;Direct supervision/assist for medications management;Assist for transportation;Help with stairs or ramp for entrance   Equipment Recommendations  Tub/shower seat    Recommendations for Other Services      Precautions / Restrictions Precautions Precautions: Fall;Other (comment) Precaution Comments: watch HR (extreme tachy) Required Braces or Orthoses: Splint/Cast Splint/Cast: R hand via OT Splint/Cast - Date Prophylactic Dressing Applied (if applicable):  (gel padding on splint on index finger) Restrictions Weight  Bearing Restrictions: Yes Other Position/Activity Restrictions: per ortho MD - RUE WB through elbow or ulnar side of hand       Mobility Bed Mobility                    Transfers                         Balance                                           ADL either performed or assessed with clinical judgement   ADL Overall ADL's : Needs assistance/impaired Eating/Feeding: Supervision/ safety;Set up;Bed level Eating/Feeding Details (indicate cue type and reason): Opening containers due to decreased pinch strength at RUE. Using R hand during self feeding task with splint doffed. Care coordiantor present and agreeable to don at end of self feeding                                   General ADL Comments: Splint check. No redness or edema at splint.    Extremity/Trunk Assessment Upper Extremity Assessment Upper Extremity Assessment: RUE deficits/detail RUE Deficits / Details: bruising @ index and middle fingers; using R hand to color RUE Coordination: decreased fine motor   Lower Extremity Assessment Lower Extremity Assessment: Defer to PT evaluation        Vision       Perception     Praxis      Cognition Arousal/Alertness: Awake/alert Behavior During Therapy: Impulsive Overall Cognitive Status: History of cognitive impairments - at baseline  General Comments: most likely at baseline; follow commands and engaging in simple conversation.        Exercises      Shoulder Instructions       General Comments Care Coordinator, Mendel Ryder, present throughout    Pertinent Vitals/ Pain       Pain Assessment Pain Assessment: Faces Faces Pain Scale: No hurt Pain Intervention(s): Monitored during session  Home Living                                          Prior Functioning/Environment              Frequency  Min 3X/week        Progress  Toward Goals  OT Goals(current goals can now be found in the care plan section)  Progress towards OT goals: Progressing toward goals  Acute Rehab OT Goals OT Goal Formulation: With patient/family Time For Goal Achievement: 11/19/21 Potential to Achieve Goals: Good  Plan Discharge plan remains appropriate;Frequency needs to be updated    Co-evaluation                 AM-PAC OT "6 Clicks" Daily Activity     Outcome Measure   Help from another person eating meals?: A Little Help from another person taking care of personal grooming?: A Lot Help from another person toileting, which includes using toliet, bedpan, or urinal?: Total Help from another person bathing (including washing, rinsing, drying)?: A Lot Help from another person to put on and taking off regular upper body clothing?: A Lot Help from another person to put on and taking off regular lower body clothing?: A Lot 6 Click Score: 12    End of Session    OT Visit Diagnosis: Unsteadiness on feet (R26.81);Other abnormalities of gait and mobility (R26.89);Muscle weakness (generalized) (M62.81);Other symptoms and signs involving cognitive function   Activity Tolerance Patient tolerated treatment well   Patient Left in bed;with call bell/phone within reach;with bed alarm set;with family/visitor present   Nurse Communication Other (comment);Weight bearing status (care of splint)        Time: 0131-4388 OT Time Calculation (min): 16 min  Charges: OT General Charges $OT Visit: 1 Visit OT Treatments $Orthotics/Prosthetics Check: 8-22 mins  Isiaah Cuervo MSOT, OTR/L Acute Rehab Office: [place] 11/06/2021, 4:16 PM

## 2021-11-06 NOTE — Progress Notes (Signed)
Mobility Specialist Progress Note    11/06/21 1602  Mobility  Activity Contraindicated/medical hold   RN advised d/t rate and rhythm. Will f/u as appropriate.   Hildred Alamin Mobility Specialist

## 2021-11-06 NOTE — Progress Notes (Addendum)
ANTICOAGULATION CONSULT NOTE - Initial Consult  Pharmacy Consult for heparin Indication: atrial fibrillation  No Known Allergies  Patient Measurements: Height: '4\' 6"'$  (137.2 cm) Weight: 56.1 kg (123 lb 10.9 oz) IBW/kg (Calculated) : 31.7 Heparin Dosing Weight: 45.7 kg  Vital Signs: Temp: 97.5 F (36.4 C) (06/15 0415) Temp Source: Oral (06/15 0415) BP: 131/101 (06/15 0415) Pulse Rate: 174 (06/15 0415)  Labs: Recent Labs    11/04/21 0826 11/05/21 0111 11/06/21 0359  HGB 10.1* 9.0* 9.9*  HCT 30.4* 27.9* 30.5*  PLT 51* 38* 50*  APTT 34  --   --   LABPROT 18.3*  --   --   INR 1.5*  --   --   HEPARINUNFRC  --   --  <0.10*  CREATININE 1.35* 1.04* 0.91    Estimated Creatinine Clearance: 44.1 mL/min (by C-G formula based on SCr of 0.91 mg/dL).  Medical History: Past Medical History:  Diagnosis Date   Anemia    Bipolar 2 disorder ([place]) 05/14/2012   Chronic kidney disease    stage 3   Chronic kidney disease, stage 3 ([place]) 01/04/2015   Encephalopathy 01/04/2015   Fall    Heart murmur    stable for yrs-from childhood.   Hypothyroidism 05/14/2012   Intellectual disability 05/14/2012   Mental retardation    Monteggia's fracture 03/31/2019   Osteoporosis    Schizophrenia ([place])    Seizures ([place])    last one > 5 yrs   Tardive dyskinesia    Thrombocytopenic disorder ([place]) 05/06/2020   Tinea pedis 11/04/2021   Venous stasis syndrome 11/04/2021    Medications:  Infusions:   amiodarone 30 mg/hr (11/06/21 0652)   cefTRIAXone (ROCEPHIN)  IV     diltiazem (CARDIZEM) infusion 15 mg/hr (11/06/21 0224)   heparin 900 Units/hr (11/06/21 0549)    Assessment: 56 yo F presents with atrial fibrillation and PMH of chronic thrombocytopenia (baseline ~50) likely secondary to valproate. Not on anticoagulation prior to admission. Pharmacy consulted for heparin dosing.  Heparin level 0.2 is subtherapeutic after 1500 unit bolus and rate increase to 900 units/hr.  CBC stable - Hgb 9.9, Plt  50.  Goal of Therapy:  Heparin level 0.3-0.7 units/ml Monitor platelets by anticoagulation protocol: Yes   Plan:  Increase heparin infusion to 1000 units/hr. 6 hour heparin level Daily CBC, heparin level Monitor for s/sx of bleeding, bruising with h/o thrombocytopenia  Laurey Arrow, PharmD PGY1 Pharmacy Resident 11/06/2021  7:20 AM  Please check [email or web address] for unit-specific pharmacy phone numbers.

## 2021-11-07 ENCOUNTER — Other Ambulatory Visit (HOSPITAL_COMMUNITY): Payer: Self-pay

## 2021-11-07 ENCOUNTER — Encounter (HOSPITAL_COMMUNITY)
Admission: EM | Disposition: A | Discharge status: Home Health | Payer: Self-pay | Source: Home / Self Care | Attending: Family Medicine

## 2021-11-07 ENCOUNTER — Encounter (HOSPITAL_COMMUNITY): Payer: Self-pay | Admitting: Family Medicine

## 2021-11-07 DIAGNOSIS — G40909 Epilepsy, unspecified, not intractable, without status epilepticus: Secondary | ICD-10-CM

## 2021-11-07 DIAGNOSIS — I471 Supraventricular tachycardia: Secondary | ICD-10-CM | POA: Diagnosis not present

## 2021-11-07 DIAGNOSIS — J189 Pneumonia, unspecified organism: Secondary | ICD-10-CM | POA: Diagnosis not present

## 2021-11-07 DIAGNOSIS — I4891 Unspecified atrial fibrillation: Secondary | ICD-10-CM | POA: Diagnosis not present

## 2021-11-07 LAB — CBC
HCT: 29 % — ABNORMAL LOW (ref 36.0–46.0)
Hemoglobin: 9.3 g/dL — ABNORMAL LOW (ref 12.0–15.0)
MCH: 31.6 pg (ref 26.0–34.0)
MCHC: 32.1 g/dL (ref 30.0–36.0)
MCV: 98.6 fL (ref 80.0–100.0)
Platelets: 51 10*3/uL — ABNORMAL LOW (ref 150–400)
RBC: 2.94 MIL/uL — ABNORMAL LOW (ref 3.87–5.11)
RDW: 14.7 % (ref 11.5–15.5)
WBC: 7.3 10*3/uL (ref 4.0–10.5)
nRBC: 0.3 % — ABNORMAL HIGH (ref 0.0–0.2)

## 2021-11-07 LAB — MAGNESIUM: Magnesium: 2.2 mg/dL (ref 1.7–2.4)

## 2021-11-07 LAB — HEPARIN LEVEL (UNFRACTIONATED): Heparin Unfractionated: 0.43 IU/mL (ref 0.30–0.70)

## 2021-11-07 SURGERY — CANCELLED PROCEDURE

## 2021-11-07 MED ORDER — AMIODARONE HCL 200 MG PO TABS
200.0000 mg | ORAL_TABLET | Freq: Every day | ORAL | Status: DC
Start: 2021-11-22 — End: 2021-11-10

## 2021-11-07 MED ORDER — AMIODARONE HCL 200 MG PO TABS
400.0000 mg | ORAL_TABLET | Freq: Two times a day (BID) | ORAL | Status: DC
  Administered 2021-11-08 – 2021-11-09 (×4): 400 mg via ORAL
  Filled 2021-11-07 (×4): qty 2

## 2021-11-07 MED ORDER — RIVAROXABAN 15 MG PO TABS
15.0000 mg | ORAL_TABLET | Freq: Every day | ORAL | Status: DC
  Administered 2021-11-07 – 2021-11-09 (×3): 15 mg via ORAL
  Filled 2021-11-07 (×3): qty 1

## 2021-11-07 MED ORDER — VERAPAMIL HCL 80 MG PO TABS
80.0000 mg | ORAL_TABLET | Freq: Three times a day (TID) | ORAL | Status: AC
  Administered 2021-11-07 – 2021-11-08 (×4): 80 mg via ORAL
  Filled 2021-11-07 (×5): qty 1

## 2021-11-07 NOTE — Progress Notes (Signed)
Pt has been in NSR since 0204. Pt is scheduled for a cardioversion at 1130. Pt also has AM medications ordered and can only take medications with applesauce. Paged cardiology on call and do to this situation with pt the medications will be held until later in the AM if pt were to convert back into A. Fib and need the cardioversion.

## 2021-11-07 NOTE — Progress Notes (Addendum)
Rounded on pt and found pt had converted to NSR in low to mid 70's. BP has slightly dropped as well Cardizem stopped at this time. Pt is closely being monitored.

## 2021-11-07 NOTE — Progress Notes (Signed)
Mobility Specialist Progress Note:   11/07/21 1430  Mobility  Activity Ambulated with assistance in hallway  Level of Assistance Contact guard assist, steadying assist  Assistive Device Front wheel walker  Distance Ambulated (ft) 150 ft  Activity Response Tolerated well  $Mobility charge 1 Mobility   Pt received in bed willing to participate in mobility. No complaints of pain. Left in bed with call bell in reach and all needs met.   [facility] Likes Mobility Specialist

## 2021-11-07 NOTE — Progress Notes (Signed)
Patient's mother, Dondra Prader, authorized information to be given to patient's 2 caregivers [place] and [place].

## 2021-11-07 NOTE — TOC Benefit Eligibility Note (Signed)
Patient Teacher, English as a foreign language completed.    The patient is currently admitted and upon discharge could be taking Eliquis 5 mg.  The current 30 day co-pay is, $0.00.   The patient is currently admitted and upon discharge could be taking Xarelto 20 mg.  The current 30 day co-pay is, $0.00.   The patient is insured through [place], [place] Patient Advocate Specialist [place] Patient Advocate Team Direct Number: [phone]  Fax: [phone]

## 2021-11-07 NOTE — Progress Notes (Signed)
FPTS Brief Progress Note  S: Patient sleeping   O: BP 102/66 (BP Location: Right Arm)   Pulse 77   Temp 97.9 F (36.6 C) (Axillary)   Resp 16   Ht '4\' 6"'$  (1.372 m)   Wt 56.1 kg   SpO2 96%   BMI 29.82 kg/m     A/P: Tachyrhythmia - Pt seems to have converted out of afib per telemetry review. Will continue to monitor. - Orders reviewed. Labs for AM ordered, which was adjusted as needed.   Gladys Damme, MD 11/07/2021, 2:43 AM PGY-3, [facility] Night Resident  Please page [phone] with questions.

## 2021-11-07 NOTE — Progress Notes (Signed)
     Daily Progress Note Intern Pager: [phone]  Patient name: Diamond Bowman Medical record number: [id number] Date of birth: 1966/01/07 Age: 56 y.o. Gender: female  Primary Care Provider: Everardo Beals, NP Consultants: Orthopedics, cardiology, electrophysiology Code Status: Full  Pt Overview and Major Events to Date:  6/13: Admitted 6/16: Converted to NSR  Assessment and Plan: Diamond Bowman is a 56 y.o. female presenting with community-acquired pneumonia meeting sepsis criteria and paroxysmal A-fib.  PMH significant for MDD, tardive dyskinesia, CKD 3, hypothyroidism, schizophrenia, intellectual disability, seizures, and falls. * Community acquired pneumonia O2 sats remained in upper 90s on room air.  Zithromax completed. -Continue Rocephin 1 g IV, day 4/5 -Follow-up blood cultures, NG x3 days -Incentive spirometer every 2 hours while awake  Atrial fibrillation (HCC) Remained in A-fib w/ RVR into the 150s-170s until ~2AM and then converted to NSR. Was planned for Cardioversion this AM. Plan for continuing anticoagulation in the meantime considering recently converted to NSR. -Cardiology following, appreciate recommendations -Verapamil '40mg'$  every 8 hours -Amiodarone IV, then switch to amiodarone 400 mg twice daily x2 weeks and then amiodarone 200 mg daily -Heparin IV   FEN/GI: Regular PPx: IV heparin Dispo:Home with home health  1-2 days if continuing to be stable in NSR .  Subjective:  States she is hungry this morning but otherwise doing well.  Objective: Temp:  [97.9 F (36.6 C)-98.8 F (37.1 C)] 98.1 F (36.7 C) (06/16 0830) Pulse Rate:  [77-159] 102 (06/16 0752) Resp:  [13-20] 20 (06/16 0830) BP: (95-147)/(49-95) 122/63 (06/16 0830) SpO2:  [91 %-98 %] 97 % (06/16 0430) Weight:  [55.2 kg] 55.2 kg (06/16 0529) Physical Exam: General: Awake, alert, NAD Cardiovascular: RRR, no murmurs auscultated Respiratory: Normal WOB, rhonchorous  breathing  Laboratory: Most recent CBC Lab Results  Component Value Date   WBC 7.3 11/07/2021   HGB 9.3 (L) 11/07/2021   HCT 29.0 (L) 11/07/2021   MCV 98.6 11/07/2021   PLT 51 (L) 11/07/2021   Most recent BMP    Latest Ref Rng & Units 11/06/2021    3:59 AM  BMP  Glucose 70 - 99 mg/dL 115   BUN 6 - 20 mg/dL 13   Creatinine 0.44 - 1.00 mg/dL 0.91   Sodium 135 - 145 mmol/L 140   Potassium 3.5 - 5.1 mmol/L 4.0   Chloride 98 - 111 mmol/L 103   CO2 22 - 32 mmol/L 27   Calcium 8.9 - 10.3 mg/dL 7.7    Imaging/Diagnostic Tests: No new imaging results. Wells Guiles, DO 11/07/2021, 12:03 PM  PGY-1, [place] Intern pager: [phone], text pages welcome Secure chat group [place]

## 2021-11-07 NOTE — Progress Notes (Addendum)
  Pt converted to NSR overnight with rates 70-100s  Reviewed case with Dr. Curt Bears  Would continue IV amiodarone through the day and overnight, and convert to po amiodarone tomorrow if remains stable.   Would use: Amiodarone 400 mg BID x 2 weeks Then Amiodarone 200 mg daily   Total length of amiodarone and anticoagulation can be determined as outpatient pending course, but will need anticoagulation AT LEAST 1 month post converted to NSR.   EP will follow at a distance. Please call back with questions.   Legrand Como [phone] [place], PA-C  11/07/2021 8:39 AM

## 2021-11-07 NOTE — Progress Notes (Signed)
  Progress Note   Date: 11/06/2021  Patient Name: Diamond Bowman        MRN#: [id number]  Clarification of the diagnosis of malnutrition:     Malnutrition ruled out

## 2021-11-07 NOTE — Progress Notes (Addendum)
Dr. Marcina Millard and Dr. Larae Grooms paged about all over body rash on pt presently at bedside evaluating pt.  Pt states it is itchy and pt remains stable at this time. Areas are raised, red, and blotchy not hot to the touch. Pt VS are stable and pt has a low grade temp.     2320- Dr. Vickki Muff paged with cardiology about pt as well. Awaiting call back.

## 2021-11-07 NOTE — Discharge Instructions (Signed)

## 2021-11-07 NOTE — Progress Notes (Signed)
FPTS Interim Progress Note  S:Received page regarding patient having rash, went to evaluate patient alongside Dr. Marcina Millard. Nurse at bedside. Patient noted to have rash that started earlier today which is the first time nurse noticed it. It has been mildly itching but denies other concerns.   O: BP 132/84   Pulse 86   Temp 99.8 F (37.7 C) (Oral)   Resp 16   Ht '4\' 6"'$  (1.372 m)   Wt 55.2 kg   SpO2 97%   BMI 29.34 kg/m   General: Patient laying comfortably, in no acute distress. CV: RRR, no murmurs or gallops auscultated Resp: CTAB, no wheezing, rales or rhonchi noted Derm: macular erythematous areas along buttocks, inguinal areas, chest and neck that are blanching (see pictures below) Psych: mood appropriate           A/P: Concerning that patient has this new-onset rash, concerning for possible allergic reaction. She did recently begin an amiodarone drip on 6/14, no other new medications noted per chart review. She does have atarax that she received shortly prior to our arrival. Hesitant to start benadryl or antihistamine as that may cause an arrythmia. Nurse is reaching out to cardiology team for possible recommendations on potential alteration of medication or any recs for symptoms relief. Will touch base and make changes accordingly. Will continue to monitor closely. Telemetry reviewed and appropriate. Vitals appropriate, reassuringly patient remains afebrile.   Donney Dice, DO 11/07/2021, 11:22 PM PGY-2, [facility] Service pager [phone]

## 2021-11-07 NOTE — Progress Notes (Signed)
Physical Therapy Treatment Patient Details Name: Diamond Bowman MRN: [id number] DOB: [birth year] Today's Date: 11/07/2021   History of Present Illness Pt is a 56 y.o. female admitted 11/04/21 with cough, SOB, AMS; of note, pt also with recent injury to R index finger sustaining R proximal phalanx fx. Workup for CAP, narrow complex tachycardia. PMH includes bipolar, schizophrenia, mental retardation, tardive dyskinesia, MDD, CKD 3, hypothyroidism.    PT Comments    PT pleasant and asking for ice cream and juice with education for current NPO status. Pt happy with coloring activities and wanting to share pencils. Pt with improved gait and activity tolerance with HR 87 NSR at rest up to 135 sinus tach with activity and down to 102 at rest in chair end of session. Continued assist for safety and mobility needed and will continue to follow for return home.     Recommendations for follow up therapy are one component of a multi-disciplinary discharge planning process, led by the attending physician.  Recommendations may be updated based on patient status, additional functional criteria and insurance authorization.  Follow Up Recommendations  Home health PT     Assistance Recommended at Discharge Frequent or constant Supervision/Assistance  Patient can return home with the following A little help with walking and/or transfers;A lot of help with bathing/dressing/bathroom;Assistance with cooking/housework;Assistance with feeding;Direct supervision/assist for medications management;Direct supervision/assist for financial management;Assist for transportation;Help with stairs or ramp for entrance   Equipment Recommendations  None recommended by PT    Recommendations for Other Services       Precautions / Restrictions Precautions Precautions: Fall;Other (comment) Precaution Comments: watch HR (extreme tachy) Required Braces or Orthoses: Splint/Cast Splint/Cast: R hand via OT Restrictions Other  Position/Activity Restrictions: per ortho MD - RUE WB through elbow or ulnar side of hand     Mobility  Bed Mobility Overal bed mobility: Needs Assistance Bed Mobility: Supine to Sit     Supine to sit: Min assist, HOB elevated     General bed mobility comments: HOB 20 degrees with min assist to fully achieve sitting    Transfers Overall transfer level: Needs assistance   Transfers: Sit to/from Stand Sit to Stand: Min guard           General transfer comment: minguard for safety for lines, pt impulsive and needs assist to keep lines from getting tangled and land in chair safely    Ambulation/Gait Ambulation/Gait assistance: Min assist Gait Distance (Feet): 150 Feet Assistive device: Rolling walker (2 wheels) Gait Pattern/deviations: Step-through pattern, Decreased stride length, Trunk flexed Gait velocity: too quick for pt safety after halfway Gait velocity interpretation: >4.37 ft/sec, indicative of normal walking speed   General Gait Details: pt with use of RW with quick gait with assist for lines and direction, tachycardia to 135 with gait. pt impulsive with return to room and ditching rW at door with lack of awareness for lines and needing assist to prevent ripping them out on furniture   Stairs             Wheelchair Mobility    Modified Rankin (Stroke Patients Only)       Balance Overall balance assessment: Needs assistance   Sitting balance-Leahy Scale: Fair Sitting balance - Comments: static sitting     Standing balance-Leahy Scale: Fair Standing balance comment: able to walk without UE support but tends to reach for support and rW for hall gait  Cognition Arousal/Alertness: Awake/alert Behavior During Therapy: Flat affect, Impulsive Overall Cognitive Status: History of cognitive impairments - at baseline                                 General Comments: most likely at baseline; follow  commands and engaging in simple conversation.        Exercises General Exercises - Lower Extremity Long Arc Quad: AROM, Both, 15 reps, Seated Hip ABduction/ADduction: AAROM, Both, Seated, 10 reps Hip Flexion/Marching: AAROM, Both, 15 reps, Seated    General Comments        Pertinent Vitals/Pain Pain Assessment Pain Assessment: No/denies pain    Home Living                          Prior Function            PT Goals (current goals can now be found in the care plan section) Progress towards PT goals: Progressing toward goals    Frequency    Min 3X/week      PT Plan Current plan remains appropriate    Co-evaluation              AM-PAC PT "6 Clicks" Mobility   Outcome Measure  Help needed turning from your back to your side while in a flat bed without using bedrails?: A Little Help needed moving from lying on your back to sitting on the side of a flat bed without using bedrails?: A Little Help needed moving to and from a bed to a chair (including a wheelchair)?: A Little Help needed standing up from a chair using your arms (e.g., wheelchair or bedside chair)?: A Little Help needed to walk in hospital room?: A Little Help needed climbing 3-5 steps with a railing? : A Lot 6 Click Score: 17    End of Session   Activity Tolerance: Patient tolerated treatment well Patient left: in chair;with call bell/phone within reach;with chair alarm set Nurse Communication: Mobility status PT Visit Diagnosis: Other abnormalities of gait and mobility (R26.89);Unsteadiness on feet (R26.81)     Time: 9449-6759 PT Time Calculation (min) (ACUTE ONLY): 23 min  Charges:  $Gait Training: 8-22 mins $Therapeutic Exercise: 8-22 mins                     Bayard Males, PT Acute Rehabilitation Services Office: [phone]    Diamond Bowman 11/07/2021, 7:56 AM

## 2021-11-07 NOTE — Progress Notes (Signed)
Occupational Therapy Treatment Patient Details Name: Diamond Bowman MRN: [id number] DOB: April 26, 1966 Today's Date: 11/07/2021   History of present illness Pt is a 56 y.o. female admitted 11/04/21 with cough, SOB, AMS; of note, pt also with recent injury to R index finger sustaining R proximal phalanx fx. Workup for CAP, narrow complex tachycardia. PMH includes bipolar, schizophrenia, mental retardation, tardive dyskinesia, MDD, CKD 3, hypothyroidism.   OT comments  Performing splint check; no redness or edema at R hand. Continues bruising at index and middle fingers as well as palm. Pt using R as needed during coloring and washing her face. Providing caregiver continued education on splint management, wear schedule, and skin checks. Continue to recommend dc to home with HHOT. Will continue to follow acutely as admitted.    Recommendations for follow up therapy are one component of a multi-disciplinary discharge planning process, led by the attending physician.  Recommendations may be updated based on patient status, additional functional criteria and insurance authorization.    Follow Up Recommendations  Home health OT    Assistance Recommended at Discharge Frequent or constant Supervision/Assistance  Patient can return home with the following  A little help with walking and/or transfers;A lot of help with bathing/dressing/bathroom;Assistance with cooking/housework;Assistance with feeding;Direct supervision/assist for medications management;Assist for transportation;Help with stairs or ramp for entrance   Equipment Recommendations  Tub/shower seat    Recommendations for Other Services      Precautions / Restrictions Precautions Precautions: Fall;Other (comment) Precaution Comments: watch HR (extreme tachy) Required Braces or Orthoses: Splint/Cast Splint/Cast: R hand via OT Splint/Cast - Date Prophylactic Dressing Applied (if applicable):  (gel padding on splint on index  finger) Restrictions Weight Bearing Restrictions: Yes Other Position/Activity Restrictions: per ortho MD - RUE WB through elbow or ulnar side of hand       Mobility Bed Mobility                    Transfers                         Balance                                           ADL either performed or assessed with clinical judgement   ADL Overall ADL's : Needs assistance/impaired     Grooming: Wash/dry face;Set up;Bed level                                 General ADL Comments: Splint check. No redness or edema at splint. Educating caregiver on splitn management, care, and schedule    Extremity/Trunk Assessment Upper Extremity Assessment Upper Extremity Assessment: RUE deficits/detail RUE Deficits / Details: bruising @ index and middle fingers as well as palm; using R hand to color RUE Coordination: decreased fine motor   Lower Extremity Assessment Lower Extremity Assessment: Defer to PT evaluation        Vision       Perception     Praxis      Cognition Arousal/Alertness: Awake/alert Behavior During Therapy: Flat affect, Impulsive Overall Cognitive Status: History of cognitive impairments - at baseline  General Comments: most likely at baseline; follow commands and engaging in simple conversation.        Exercises      Shoulder Instructions       General Comments Caregiver present during session    Pertinent Vitals/ Pain       Pain Assessment Pain Assessment: Faces Faces Pain Scale: No hurt Pain Intervention(s): Monitored during session  Home Living                                          Prior Functioning/Environment              Frequency  Min 3X/week        Progress Toward Goals  OT Goals(current goals can now be found in the care plan section)  Progress towards OT goals: Progressing toward goals  Acute  Rehab OT Goals OT Goal Formulation: With patient/family Time For Goal Achievement: 11/19/21 Potential to Achieve Goals: Good ADL Goals Pt Will Perform Eating: with set-up;sitting Pt Will Transfer to Toilet: with min guard assist;ambulating;regular height toilet Additional ADL Goal #1: Caregiver will demonstrate skin check and donning/doffing of splint independently  Plan Discharge plan remains appropriate;Frequency needs to be updated    Co-evaluation                 AM-PAC OT "6 Clicks" Daily Activity     Outcome Measure   Help from another person eating meals?: A Little Help from another person taking care of personal grooming?: A Lot Help from another person toileting, which includes using toliet, bedpan, or urinal?: Total Help from another person bathing (including washing, rinsing, drying)?: A Lot Help from another person to put on and taking off regular upper body clothing?: A Lot Help from another person to put on and taking off regular lower body clothing?: A Lot 6 Click Score: 12    End of Session Equipment Utilized During Treatment: Other (comment) (splint)  OT Visit Diagnosis: Unsteadiness on feet (R26.81);Other abnormalities of gait and mobility (R26.89);Muscle weakness (generalized) (M62.81);Other symptoms and signs involving cognitive function   Activity Tolerance Patient tolerated treatment well   Patient Left in bed;with call bell/phone within reach;with bed alarm set;with family/visitor present   Nurse Communication Other (comment);Weight bearing status (care of splint)        Time: 6761-9509 OT Time Calculation (min): 9 min  Charges: OT General Charges $OT Visit: 1 Visit OT Treatments $Orthotics/Prosthetics Check: 8-22 mins  Shalev Helminiak MSOT, OTR/L Acute Rehab Office: [place] 11/07/2021, 3:25 PM

## 2021-11-07 NOTE — TOC Initial Note (Signed)
Transition of Care [facility]) - Initial/Assessment Note    Patient Details  Name: Diamond Bowman MRN: [id number] Date of Birth: 11-10-1965  Transition of Care [place]) CM/SW Contact:    Bethena Roys, RN Phone Number: 11/07/2021, 4:00 PM  Clinical Narrative: Case Manager spoke with the patients therapeutic provider  Allen,Leiona @ [phone]. Vela Prose states that the patient has a team that follows her such as her (QP's ) Engineer, water. Per Vela Prose, the patient goes to a Day Program from 8:00 am- 4:00 pm Monday- Friday and the patient lives with [place]. Leiona asked when the patient would be stable for discharge- secure chat sent to providers and the earliest may be Sunday. Vela Prose is out of town until Tuesday and if the patient is discharged over the weekend; she will need to make provisions regarding the patients care. PT/OT has recommended [facility] PT/OT- if the patient continues to go to the Day Program the patient is considered not homebound.-information provided to [place]. She states that she will need to discuss with the team. Geronimo Boot states that she will not be able to lift the patient or assist with strenuous activity. She is asking that the patient be ambulating and getting to the bathroom prior to transition home. [facility] has accepted the patient for PT/OT only if she is homebound and not going to the Day Program. Weekend Case Managers to follow for additional needs as the patient progresses.    Expected Discharge Plan: [place] Barriers to Discharge: Continued Medical Work up   Patient Goals and CMS Choice     Choice offered to / list presented to : [facility] POA / Guardian  Expected Discharge Plan and Services Expected Discharge Plan: [place] In-house Referral: NA Discharge Planning Services: CM Consult Post Acute Care Choice: [place] arrangements for the past 2 months: Single Family Home                 DME Arranged: N/A          HH Arranged: PT, OT [place] Agency: [place] ([place]) Date HH Agency Contacted: 11/07/21 Time HH Agency Contacted: 1600 Representative spoke with at [place]: [place] Arrangements/Services Living arrangements for the past 2 months: [place] with:: Other (Comment) (Caregiver- Therapeutic Provider.) Patient language and need for interpreter reviewed:: Yes Do you feel safe going back to the place where you live?: Yes      Need for Family Participation in Patient Care: Yes (Comment) Care giver support system in place?: Yes (comment)   Criminal Activity/Legal Involvement Pertinent to Current Situation/Hospitalization: No - Comment as needed  Activities of Daily Living Home Assistive Devices/Equipment: Walker (specify type) ADL Screening (condition at time of admission) Patient's cognitive ability adequate to safely complete daily activities?: No Is the patient deaf or have difficulty hearing?: No Does the patient have difficulty seeing, even when wearing glasses/contacts?: No Does the patient have difficulty concentrating, remembering, or making decisions?: Yes Patient able to express need for assistance with ADLs?: Yes Does the patient have difficulty dressing or bathing?: Yes Independently performs ADLs?: No Communication: Independent Dressing (OT): Dependent Is this a change from baseline?: Pre-admission baseline Grooming: Needs assistance Is this a change from baseline?: Pre-admission baseline Feeding: Needs assistance Is this a change from baseline?: Pre-admission baseline Bathing: Needs assistance Is this a change from baseline?: Pre-admission baseline Toileting: Needs assistance Is this a change from baseline?: Pre-admission baseline In/Out Bed: Needs assistance Is  this a change from baseline?: Pre-admission baseline Walks in Home: Needs assistance Is this a change from baseline?: Pre-admission baseline Does the patient  have difficulty walking or climbing stairs?: Yes Weakness of Legs: Both Weakness of Arms/Hands: Both  Permission Sought/Granted Permission sought to share information with : Family Supports, Customer service manager, Case Optician, dispensing granted to share information with : Yes, Verbal Permission Granted     Permission granted to share info w AGENCY: [facility]        Emotional Assessment Appearance:: Appears stated age       Alcohol / Substance Use: Not Applicable Psych Involvement: No (comment)  Admission diagnosis:  Community acquired pneumonia [J18.9] Atrial fibrillation, unspecified type ([place]) [I48.91] Community acquired pneumonia, unspecified laterality [J18.9] Patient Active Problem List   Diagnosis Date Noted   Closed nondisplaced fracture of distal phalanx of right index finger 11/05/2021   Community acquired pneumonia 11/04/2021   Narrow complex tachycardia ([place]) 11/04/2021   Atrial fibrillation ([place]) 11/04/2021   Sepsis due to pneumonia ([place])    Thrombocytopenic disorder ([place]) 05/06/2020   Chronic kidney disease, stage 3 ([place]) 01/04/2015   Schizophrenia ([place]) 12/28/2014   Intellectual disability 05/14/2012   Seizure disorder ([place]) 05/14/2012   Bipolar 2 disorder ([place]) 05/14/2012   Hypothyroidism 05/14/2012   Osteoporosis 05/14/2012   PCP:  Everardo Beals, NP Pharmacy:   [place], [place] - [address] [place] [address] Phone: [phone] Fax: [phone]  Readmission Risk Interventions    03/16/2019    1:56 PM  Readmission Risk Prevention Plan  Medication Screening Complete  Transportation Screening Complete

## 2021-11-07 NOTE — Progress Notes (Addendum)
ANTICOAGULATION CONSULT NOTE  Pharmacy Consult for heparin Indication: atrial fibrillation  No Known Allergies  Patient Measurements: Height: '4\' 6"'$  (137.2 cm) Weight: 55.2 kg (121 lb 11.1 oz) IBW/kg (Calculated) : 31.7 Heparin Dosing Weight: 45.7 kg  Vital Signs: Temp: 98.8 F (37.1 C) (06/16 0430) Temp Source: Oral (06/16 0430) BP: 103/49 (06/16 0430) Pulse Rate: 78 (06/16 0430)  Labs: Recent Labs    11/04/21 0826 11/05/21 0111 11/05/21 0111 11/06/21 0359 11/06/21 1217 11/06/21 1930 11/07/21 0434  HGB 10.1* 9.0*  --  9.9*  --   --  9.3*  HCT 30.4* 27.9*  --  30.5*  --   --  29.0*  PLT 51* 38*  --  50*  --   --  51*  APTT 34  --   --   --   --   --   --   LABPROT 18.3*  --   --   --   --   --   --   INR 1.5*  --   --   --   --   --   --   HEPARINUNFRC  --   --    < > <0.10* 0.20* 0.25* 0.43  CREATININE 1.35* 1.04*  --  0.91  --   --   --    < > = values in this interval not displayed.     Estimated Creatinine Clearance: 43.7 mL/min (by C-G formula based on SCr of 0.91 mg/dL).  Medical History: Past Medical History:  Diagnosis Date   Anemia    Bipolar 2 disorder ([place]) 05/14/2012   Chronic kidney disease    stage 3   Chronic kidney disease, stage 3 ([place]) 01/04/2015   Encephalopathy 01/04/2015   Fall    Heart murmur    stable for yrs-from childhood.   Hypothyroidism 05/14/2012   Intellectual disability 05/14/2012   Mental retardation    Monteggia's fracture 03/31/2019   Osteoporosis    Schizophrenia ([place])    Seizures ([place])    last one > 5 yrs   Tardive dyskinesia    Thrombocytopenic disorder ([place]) 05/06/2020   Tinea pedis 11/04/2021   Venous stasis syndrome 11/04/2021    Medications:  Infusions:   amiodarone 30 mg/hr (11/07/21 0609)   cefTRIAXone (ROCEPHIN)  IV Stopped (11/06/21 0945)   diltiazem (CARDIZEM) infusion Stopped (11/07/21 0215)   heparin 1,150 Units/hr (11/06/21 2040)    Assessment: 56 yo F presents with atrial fibrillation and PMH of  chronic thrombocytopenia (baseline ~50) likely secondary to valproate. Not on anticoagulation prior to admission. Pharmacy consulted for heparin dosing.  Heparin level 0.45 is therapeutic on 1150 units/hr after two rate increases yesterday. Hgb stable 9.3, chronic thrombocytopenia stable with pltc 50-51. No infusion problems or bleeding noted.  PM update: Transitioning to Xarelto. STOP heparin infusion and give Xarelto 15 mg once daily x1 month (CrCl <50 mL/min)   Goal of Therapy:  Heparin level 0.3-0.7 units/ml Monitor platelets by anticoagulation protocol: Yes   Plan:  Continue heparin infusion '@1150'$  units/hr. Daily CBC, heparin level Monitor for s/sx of bleeding, bruising with h/o chronic thrombocytopenia  Laurey Arrow, PharmD PGY1 Pharmacy Resident 11/07/2021  7:21 AM  Please check [email or web address] for unit-specific pharmacy phone numbers.

## 2021-11-07 NOTE — Progress Notes (Signed)
  Progress Note   Date: 11/06/2021  Patient Name: Diamond Bowman        MRN#: [id number]  Clarification of diagnosis: -Other (please specify): Acute Kidney Injury without chronic kidney disease

## 2021-11-08 DIAGNOSIS — R21 Rash and other nonspecific skin eruption: Secondary | ICD-10-CM

## 2021-11-08 DIAGNOSIS — I4891 Unspecified atrial fibrillation: Secondary | ICD-10-CM | POA: Diagnosis not present

## 2021-11-08 DIAGNOSIS — J189 Pneumonia, unspecified organism: Secondary | ICD-10-CM | POA: Diagnosis not present

## 2021-11-08 DIAGNOSIS — G40909 Epilepsy, unspecified, not intractable, without status epilepticus: Secondary | ICD-10-CM | POA: Diagnosis not present

## 2021-11-08 LAB — BASIC METABOLIC PANEL
Anion gap: 7 (ref 5–15)
BUN: 16 mg/dL (ref 6–20)
CO2: 27 mmol/L (ref 22–32)
Calcium: 7.7 mg/dL — ABNORMAL LOW (ref 8.9–10.3)
Chloride: 107 mmol/L (ref 98–111)
Creatinine, Ser: 1.12 mg/dL — ABNORMAL HIGH (ref 0.44–1.00)
GFR, Estimated: 57 mL/min — ABNORMAL LOW (ref >60)
Glucose, Bld: 79 mg/dL (ref 70–99)
Potassium: 4.7 mmol/L (ref 3.5–5.1)
Sodium: 141 mmol/L (ref 135–145)

## 2021-11-08 LAB — DIFFERENTIAL
Abs Immature Granulocytes: 0.24 10*3/uL — ABNORMAL HIGH (ref 0.00–0.07)
Basophils Absolute: 0.1 10*3/uL (ref 0.0–0.1)
Basophils Relative: 1 %
Eosinophils Absolute: 0.1 10*3/uL (ref 0.0–0.5)
Eosinophils Relative: 2 %
Immature Granulocytes: 4 %
Lymphocytes Relative: 24 %
Lymphs Abs: 1.5 10*3/uL (ref 0.7–4.0)
Monocytes Absolute: 0.8 10*3/uL (ref 0.1–1.0)
Monocytes Relative: 12 %
Neutro Abs: 3.8 10*3/uL (ref 1.7–7.7)
Neutrophils Relative %: 57 %

## 2021-11-08 LAB — CBC
HCT: 27.6 % — ABNORMAL LOW (ref 36.0–46.0)
HCT: 27.8 % — ABNORMAL LOW (ref 36.0–46.0)
Hemoglobin: 9.3 g/dL — ABNORMAL LOW (ref 12.0–15.0)
Hemoglobin: 9.1 g/dL — ABNORMAL LOW (ref 12.0–15.0)
MCH: 32.7 pg (ref 26.0–34.0)
MCH: 32.3 pg (ref 26.0–34.0)
MCHC: 33.7 g/dL (ref 30.0–36.0)
MCHC: 32.7 g/dL (ref 30.0–36.0)
MCV: 97.2 fL (ref 80.0–100.0)
MCV: 98.6 fL (ref 80.0–100.0)
Platelets: 60 10*3/uL — ABNORMAL LOW (ref 150–400)
Platelets: 60 10*3/uL — ABNORMAL LOW (ref 150–400)
RBC: 2.84 MIL/uL — ABNORMAL LOW (ref 3.87–5.11)
RBC: 2.82 MIL/uL — ABNORMAL LOW (ref 3.87–5.11)
RDW: 14.6 % (ref 11.5–15.5)
RDW: 14.7 % (ref 11.5–15.5)
WBC: 7.3 10*3/uL (ref 4.0–10.5)
WBC: 6.2 10*3/uL (ref 4.0–10.5)
nRBC: 0.4 % — ABNORMAL HIGH (ref 0.0–0.2)
nRBC: 0.5 % — ABNORMAL HIGH (ref 0.0–0.2)

## 2021-11-08 MED ORDER — VERAPAMIL HCL ER 240 MG PO TBCR
240.0000 mg | EXTENDED_RELEASE_TABLET | Freq: Every day | ORAL | Status: DC
  Administered 2021-11-09 – 2021-11-10 (×2): 240 mg via ORAL
  Filled 2021-11-08 (×3): qty 1

## 2021-11-08 MED ORDER — METOPROLOL SUCCINATE ER 100 MG PO TB24: 100.0000 mg | ORAL_TABLET | Freq: Every morning | ORAL | Status: DC

## 2021-11-08 NOTE — Assessment & Plan Note (Addendum)
Vitals remained stable however rash has significantly worsened. Potentially related to Verapamil but still unsure.  -Benadryl 50 mg every 6 hours -Prednisone 40mg  daily (day 2/3) -Triamcinolone cream 1% -Hydroxyzine 75mg  TID -Pepcid 20 mg daily -continue to monitor

## 2021-11-08 NOTE — Progress Notes (Addendum)
Dr. Vickki Muff with cardiology at bedside to evaluate pt. Verbal order from MD to stop IV Amiodarone drip NOW. Updated Dr. Larae Grooms about pt as well. Pt remains stable at this time will continue to monitor pt.

## 2021-11-08 NOTE — Progress Notes (Signed)
Pt cleaned up after found soiled. Rash all over pt's body no longer raised and less red than previously assessed. Will continue to monitor pt. Pt has no complaints at this time and remains stable.

## 2021-11-08 NOTE — Progress Notes (Addendum)
Contacted pt's therapeutic provider Vela Prose) left VM to contact CM to discuss D/C plan.  15:05 - Vela Prose did not return my call. Conctated Leiona and left another VM regarding tentative D/C date for tomorrow.   Will continue to f/u to assist with the D/C plan.

## 2021-11-08 NOTE — Progress Notes (Signed)
Daily Progress Note Intern Pager: [phone]  Patient name: Diamond Bowman Medical record number: [id number] Date of birth: 17-Jun-1965 Age: 56 y.o. Gender: female  Primary Care Provider: Everardo Beals, NP Consultants: Cardiology, orthopedic surgery, electrophysiology  Code Status: Full   Pt Overview and Major Events to Date:  6/13: Admitted 6/16: Spontaneously converted to normal sinus rhythm   Assessment and Plan: Diamond Bowman is a 56 year old female presenting with CAP who met sepsis criteria on admission and also noted to be in a-fib with RVR. Pertinent PMH/PSH includes CKD stage 3, hypothyroidism, schizophrenia, intellectual disability, seizure disorder, MDD and tardive dyskinesia.   * Community acquired pneumonia Completed course of azithromycin. Satting appropriately.  -Continue Rocephin 1 g IV, day 5/5 -Blood cultures demonstrate no growth for 3 days, continue to follow.  -Incentive spirometer every 2 hours while awake  Acute maculopapular rash Patient seemed to develop systemic maculopapular rash with erythema noted along neck, buttocks, legs and chest that started earlier in the day on 6/16 and seemed to worsen overnight. Concern that this is due to amiodarone infusion that was started on 6/14. Reassuringly patient vitals stable and remains afebrile with no leukocytosis.  -cardiology aware and held amiodarone overnight, they plan to reevaluate this morning and possibly start oral amiodarone. Appreciate cardiology recs  -continue to monitor closely  Atrial fibrillation ([name]) Spontaneously converted back to NSR without cardioversion. Heparin discontinued, started on xarelto. HR ranging between 70-100.  -Cardiology following, appreciate recommendations -Verapamil 33m every 8 hours -amiodarone held overnight due to rash, cardiology to evaluate to determine possibly transitioning to oral amiodarone later today  -continue xarelto       FEN/GI: regular  PPx:  xarelto  Dispo:Home with home health pending clinical improvement . Barriers include remaining stable in normal sinus rhythm without further episodes of a-fib with RVR.   Subjective:  No acute overnight events reported. Worsened systemic rash noted since morning which seems to be improving with discontinuation of amiodarone for now. Discussed with nurse, no further concerns at this time.   Objective: Temp:  [98.1 F (36.7 C)-99.8 F (37.7 C)] 99.6 F (37.6 C) (06/17 0440) Pulse Rate:  [79-102] 79 (06/17 0440) Resp:  [16-22] 22 (06/17 0440) BP: (107-132)/(51-95) 113/51 (06/17 0440) SpO2:  [97 %-98 %] 98 % (06/17 0440) Weight:  [56.3 kg] 56.3 kg (06/17 0544) Physical Exam: General: Patient resting comfortably, in no acute distress. Cardiovascular: RRR, no murmurs or gallops auscultated  Respiratory: CTAB, no wheezing, rales or rhonchi noted  Abdomen: soft, nontender, nondistended  Extremities: maculopapular lesions with erythema noted along buttocks, chest, neck that blanches (see prior note with pictures)  Laboratory: Most recent CBC Lab Results  Component Value Date   WBC 7.3 11/08/2021   HGB 9.3 (L) 11/08/2021   HCT 27.6 (L) 11/08/2021   MCV 97.2 11/08/2021   PLT 60 (L) 11/08/2021   Most recent BMP    Latest Ref Rng & Units 11/06/2021    3:59 AM  BMP  Glucose 70 - 99 mg/dL 115   BUN 6 - 20 mg/dL 13   Creatinine 0.44 - 1.00 mg/dL 0.91   Sodium 135 - 145 mmol/L 140   Potassium 3.5 - 5.1 mmol/L 4.0   Chloride 98 - 111 mmol/L 103   CO2 22 - 32 mmol/L 27   Calcium 8.9 - 10.3 mg/dL 7.7      Imaging/Diagnostic Tests: No new imaging studies.  GDonney Dice DO 11/08/2021, 6:11 AM  PGY-2, [facility]  [place] Intern pager: [phone], text pages welcome Secure chat group [place]

## 2021-11-08 NOTE — Progress Notes (Addendum)
Progress Note  Patient Name: Diamond Bowman Date of Encounter: 11/08/2021  Attending physician: Kinnie Feil, MD Primary care provider: Everardo Beals, NP  Subjective: Diamond Bowman is a 56 y.o. female who was seen and examined at bedside. Awake, asymptomatic, and watching TV.  No CP or shortness of breath.  Overnight events reviewed w/ RN and read her EMR since last evaluation.   Objective: Vital Signs in the last 24 hours: Temp:  [99.6 F (37.6 C)-99.8 F (37.7 C)] 99.6 F (37.6 C) (06/17 0440) Pulse Rate:  [79-89] 79 (06/17 0440) Resp:  [16-22] 22 (06/17 0440) BP: (113-132)/(51-84) 116/73 (06/17 1332) SpO2:  [97 %-98 %] 98 % (06/17 0440) Weight:  [56.3 kg] 56.3 kg (06/17 0544)  Intake/Output:  Intake/Output Summary (Last 24 hours) at 11/08/2021 1729 Last data filed at 11/08/2021 0600 Gross per 24 hour  Intake 1120.68 ml  Output 1150 ml  Net -29.32 ml    Net IO Since Admission: -210.27 mL [11/08/21 1729]  Weights:  Filed Weights   11/06/21 0415 11/07/21 0529 11/08/21 0544  Weight: 56.1 kg 55.2 kg 56.3 kg    Telemetry: Personally reviewed. NSR.   Physical examination: PHYSICAL EXAM:    11/08/2021    1:32 PM 11/08/2021    5:44 AM 11/08/2021    4:40 AM  Vitals with BMI  Weight  124 lbs 2 oz   BMI  42.70   Systolic 623  762  Diastolic 73  51  Pulse   79    CONSTITUTIONAL: No acute distress, , appears older than stated age, hemodynamically stable SKIN: Skin is warm and dry. No cyanosis. No pallor. No jaundice. Petechial rash / non pruritic noted (anterior/posterior torso, under breast tissue, abdomen, dorsum/ventral aspect of thighs.  HEAD: Normocephalic and atraumatic.  EYES: No scleral icterus MOUTH/THROAT: Moist oral membranes.  NECK: No JVD present. No thyromegaly noted. No carotid bruits  CHEST Normal respiratory effort. No intercostal retractions  LUNGS CTAB, kyphosis noted posteriorly CARDIOVASCULAR: Regular, S1S2, no murmurs rubs or  gallops. ABDOMINAL: Soft, nontender, nondistended, positive bowel sounds in all 4 quadrants, no apparent ascites.  EXTREMITIES: No pitting edema, warm to touch, 2+ bilateral DP and PT pulses HEMATOLOGIC: No significant bruising   Lab Results: Hematology Recent Labs  Lab 11/07/21 0434 11/08/21 0141 11/08/21 0628  WBC 7.3 7.3 6.2  RBC 2.94* 2.84* 2.82*  HGB 9.3* 9.3* 9.1*  HCT 29.0* 27.6* 27.8*  MCV 98.6 97.2 98.6  MCH 31.6 32.7 32.3  MCHC 32.1 33.7 32.7  RDW 14.7 14.6 14.7  PLT 51* 60* 60*    Chemistry Recent Labs  Lab 11/04/21 0826 11/05/21 0111 11/06/21 0359 11/08/21 0628  NA 138 142 140 141  K 4.2 3.8 4.0 4.7  CL 101 109 103 107  CO2 '28 28 27 27  '$ GLUCOSE 122* 131* 115* 79  BUN 23* '17 13 16  '$ CREATININE 1.35* 1.04* 0.91 1.12*  CALCIUM 8.1* 7.7* 7.7* 7.7*  PROT 5.6*  --   --   --   ALBUMIN 2.3*  --   --   --   AST 19  --   --   --   ALT 12  --   --   --   ALKPHOS 46  --   --   --   BILITOT 0.7  --   --   --   GFRNONAA 46* >60 >60 57*  ANIONGAP '9 5 10 7     '$ Cardiac Enzymes: Cardiac Panel (last 3 results)  No results for input(s): "CKTOTAL", "CKMB", "TROPONINIHS", "RELINDX" in the last 72 hours.  BNP (last 3 results) No results for input(s): "BNP" in the last 8760 hours.  ProBNP (last 3 results) No results for input(s): "PROBNP" in the last 8760 hours.   DDimer No results for input(s): "DDIMER" in the last 168 hours.   Hemoglobin A1c:  Lab Results  Component Value Date   HGBA1C 5.2 11/05/2021   MPG 102.54 11/05/2021    TSH  Recent Labs    11/04/21 0826  TSH 1.312    Lipid Panel No results found for: "CHOL", "TRIG", "HDL", "CHOLHDL", "VLDL", "LDLCALC", "LDLDIRECT"  Imaging: No results found.  CARDIAC DATABASE: EKG: 11/04/2021: Sinus tachycardia, 164 bpm with PACs and PVCs.   11/04/2021 9:31 AM: Narrow complex tachycardia likely atrial tach with frequent PACs, 181 bpm, ST-T changes likely rate related but ischemia cannot be ruled out.    11/04/2021 12:56 PM: Sinus rhythm, 66 bpm, frequent PACs with atrial bigeminy, without underlying injury pattern.     Echocardiogram: 11/04/2021  1. Left ventricular ejection fraction, by estimation, is 55 to 60%. The  left ventricle has normal function. The left ventricle has no regional  wall motion abnormalities. Left ventricular diastolic parameters were  normal.   2. Right ventricular systolic function is normal. The right ventricular  size is normal.   3. The mitral valve is grossly normal. No evidence of mitral valve  regurgitation. No evidence of mitral stenosis.   4. The aortic valve was not well visualized. Aortic valve regurgitation  is not visualized. No aortic stenosis is present.   5. Pulmonic valve regurgitation not well visualized.   Scheduled Meds:  amiodarone  400 mg Oral BID   Followed by   Derrill Memo ON 11/22/2021] amiodarone  200 mg Oral Daily   divalproex  1,500 mg Oral QHS   docusate sodium  100 mg Oral Daily   hydrocerin   Topical BID   hydrOXYzine  50 mg Oral BID   levothyroxine  75 mcg Oral Daily   mirtazapine  15 mg Oral QHS   OLANZapine zydis  15 mg Oral QHS   Rivaroxaban  15 mg Oral Q supper   traZODone  300 mg Oral QHS   verapamil  80 mg Oral Q8H   [START ON 11/09/2021] verapamil  240 mg Oral Daily    Continuous Infusions:    PRN Meds:    IMPRESSION & RECOMMENDATIONS: Diamond Bowman is a 56 y.o. Caucasian female whose past medical history and cardiac risk factors include: Documented history of mental retardation, schizophrenia, tardive dyskinesia, history of falls and fractures, thrombocytopenia, seizures, poor historian.  Impression: Paroxysmal Afib.  Rash Narrow complex tachycardia on presentation (predominantly A-fib with RVR, Paroxysmal AFL, Paroxysmal episodes of atrial tachycardia along with normal sinus with atrial bigeminy) Severe sepsis likely due to pneumonia present on arrival.  Protein calorie malnutrition. Documented history of  mental retardation. History of tardive  dyskinesia. History of fall/fractures. Thrombocytopenia. Seizures  Plan: Paroxysmal Afib.  Likely precipitated by her underlying severe sepsis due to PNA.  She was IV Cardizem (max dose) and IV Amio drip w/ plans to undergo DCCV. However, she did not need cardioversion and reverted back to SR (11/07/2021).  She was going to be transitioned to oral amiodarone as of today 11/08/2021; however, she developed a rash on the night of 11/07/2021 and based on EMR IV Amio was d/c.  Original plan was to continue [facility], oral Amio, and Verapamil for atleast one month  and evaluate long term duration as outpatient.  However, due to her new rash (of unknown significance / etiology) will discuss w/ EP.  CHA2DS2-VASc SCORE is 1 which correlates to 1.3 % risk of stroke per year (gender).  TSH within normal limits. Echo: Preserved LVEF, normal diastolic function, no significant valvular heart disease. Continue telemetry  RASH:  Per her nursing team its improving.  Etiology is unknown.  Hemodynamically is stable and clinically resting in bed watching TV.  New meds started during this visit include: IV amiodarone, IV heparin, IV Cardizem drip (all have been d/c'd now), oral verapamil, Xarelto, and antibiotics. Xarelto started yday 11/07/2021. She also has baseline thrombocytopenia that may be contributing.   Will reach out to the floor pharmacist to see if any drug-to drug interaction may be contributing.  Management per primary.   Severe sepsis likely due to pneumonia: Management per primary team.  Thrombocytopenia: Present on arrival. See labs above.  We will defer management to primary team Monitor for bleeding.  This note was created using a voice recognition software as a result there may be grammatical errors inadvertently enclosed that do not reflect the nature of this encounter. Every attempt is made to correct such errors.  Mechele Claude [facility]  Pager:  [phone] Office: [phone] 11/08/2021, 5:29 PM

## 2021-11-08 NOTE — Progress Notes (Signed)
MEDICATION RELATED CONSULT NOTE - INITIAL   Pharmacy Consult for non-specific rash Indication: possible cause of rash  No Known Allergies  Medical History: Past Medical History:  Diagnosis Date   Anemia    Bipolar 2 disorder ([place]) 05/14/2012   Chronic kidney disease    stage 3   Chronic kidney disease, stage 3 ([place]) 01/04/2015   Encephalopathy 01/04/2015   Fall    Heart murmur    stable for yrs-from childhood.   Hypothyroidism 05/14/2012   Intellectual disability 05/14/2012   Mental retardation    Monteggia's fracture 03/31/2019   Osteoporosis    Schizophrenia ([place])    Seizures ([place])    last one > 5 yrs   Tardive dyskinesia    Thrombocytopenic disorder ([place]) 05/06/2020   Tinea pedis 11/04/2021   Venous stasis syndrome 11/04/2021    Medications:  New Meds:  Rocephin, aminodarone, verapamil, Xarelto  Assessment: By far the most likely culprit to a rash is the Rocephin. Next most likely would be the Xarelto.  The other meds have a very low probability to cause a rash.  If it improves, it was most likely the Rocephin as it has been stopped  Plan:  Monitor for changes and/or clearance of rash.  Berta Minor 11/08/2021,5:52 PM

## 2021-11-08 NOTE — Progress Notes (Signed)
Progress Note  Patient Name: Diamond Bowman Date of Encounter: 11/08/2021  Primary Cardiologist: None   Subjective   Note rash. Etiology is unclear. Question amiodarone.  Inpatient Medications    Scheduled Meds:  amiodarone  400 mg Oral BID   Followed by   Derrill Memo ON 11/22/2021] amiodarone  200 mg Oral Daily   divalproex  1,500 mg Oral QHS   docusate sodium  100 mg Oral Daily   hydrocerin   Topical BID   hydrOXYzine  50 mg Oral BID   levothyroxine  75 mcg Oral Daily   mirtazapine  15 mg Oral QHS   OLANZapine zydis  15 mg Oral QHS   Rivaroxaban  15 mg Oral Q supper   traZODone  300 mg Oral QHS   verapamil  80 mg Oral Q8H   Continuous Infusions:  amiodarone Stopped (11/08/21 0015)   cefTRIAXone (ROCEPHIN)  IV Stopped (11/07/21 0956)   PRN Meds:    Vital Signs    Vitals:   11/07/21 2051 11/07/21 2115 11/08/21 0440 11/08/21 0544  BP:  132/84 (!) 113/51   Pulse: 89 86 79   Resp:  16 (!) 22   Temp: 99.8 F (37.7 C)  99.6 F (37.6 C)   TempSrc: Oral  Oral   SpO2:  97% 98%   Weight:    56.3 kg  Height:        Intake/Output Summary (Last 24 hours) at 11/08/2021 0924 Last data filed at 11/08/2021 0600 Gross per 24 hour  Intake 2397.21 ml  Output 1850 ml  Net 547.21 ml   Filed Weights   11/06/21 0415 11/07/21 0529 11/08/21 0544  Weight: 56.1 kg 55.2 kg 56.3 kg    Telemetry    nsr - Personally Reviewed  ECG    none - Personally Reviewed  Physical Exam   GEN: No acute distress.  I cannot understand her speech.  Neck: No JVD Cardiac: RRR, no murmurs, rubs, or gallops.  Respiratory: Clear to auscultation bilaterally. GI: Soft, nontender, non-distended  MS: No edema; No deformity. Neuro:  Nonfocal  Psych: Normal affect   Labs    Chemistry Recent Labs  Lab 11/04/21 0826 11/05/21 0111 11/06/21 0359 11/08/21 0628  NA 138 142 140 141  K 4.2 3.8 4.0 4.7  CL 101 109 103 107  CO2 '28 28 27 27  '$ GLUCOSE 122* 131* 115* 79  BUN 23* '17 13 16   '$ CREATININE 1.35* 1.04* 0.91 1.12*  CALCIUM 8.1* 7.7* 7.7* 7.7*  PROT 5.6*  --   --   --   ALBUMIN 2.3*  --   --   --   AST 19  --   --   --   ALT 12  --   --   --   ALKPHOS 46  --   --   --   BILITOT 0.7  --   --   --   GFRNONAA 46* >60 >60 57*  ANIONGAP '9 5 10 7     '$ Hematology Recent Labs  Lab 11/07/21 0434 11/08/21 0141 11/08/21 0628  WBC 7.3 7.3 6.2  RBC 2.94* 2.84* 2.82*  HGB 9.3* 9.3* 9.1*  HCT 29.0* 27.6* 27.8*  MCV 98.6 97.2 98.6  MCH 31.6 32.7 32.3  MCHC 32.1 33.7 32.7  RDW 14.7 14.6 14.7  PLT 51* 60* 60*    Cardiac EnzymesNo results for input(s): "TROPONINI" in the last 168 hours. No results for input(s): "TROPIPOC" in the last 168 hours.   BNPNo  results for input(s): "BNP", "PROBNP" in the last 168 hours.   DDimer No results for input(s): "DDIMER" in the last 168 hours.   Radiology    No results found.  Cardiac Studies   none  Patient Profile     56 y.o. female admitted with atrial fib and sepsis.  Assessment & Plan    Atrial fib with RVR - she has reverted back to NSR.  Rash - unclear if rash is due to [facility] but if this is the only drug that is new, could be amio though could also be anti-biotic mediated. I would suggest holding the amio and adding toprol xl 25 mg daily tomorrow ( received verapamil today) and stopping verapamil. Alternatively, switching to long acting verapamil would be reasonable. Hopefully the atrial fib was brought on by her pneumonia and will not return.      For questions or updates, please contact Mio Please consult [email or web address] for contact info under Cardiology/STEMI.      Signed, Cristopher Peru, MD  11/08/2021, 9:24 AM

## 2021-11-08 NOTE — Plan of Care (Signed)
  Problem: Clinical Measurements: Goal: Diagnostic test results will improve Outcome: Progressing Goal: Respiratory complications will improve Outcome: Progressing Goal: Cardiovascular complication will be avoided Outcome: Progressing   Problem: Activity: Goal: Risk for activity intolerance will decrease Outcome: Progressing   Problem: Nutrition: Goal: Adequate nutrition will be maintained Outcome: Progressing   Problem: Elimination: Goal: Will not experience complications related to urinary retention Outcome: Progressing   Problem: Pain Managment: Goal: General experience of comfort will improve Outcome: Progressing

## 2021-11-09 DIAGNOSIS — J189 Pneumonia, unspecified organism: Secondary | ICD-10-CM | POA: Diagnosis not present

## 2021-11-09 DIAGNOSIS — I4891 Unspecified atrial fibrillation: Secondary | ICD-10-CM | POA: Diagnosis not present

## 2021-11-09 LAB — CULTURE, BLOOD (ROUTINE X 2)
Culture: NO GROWTH
Culture: NO GROWTH
Special Requests: ADEQUATE
Special Requests: ADEQUATE

## 2021-11-09 LAB — CBC
HCT: 27.7 % — ABNORMAL LOW (ref 36.0–46.0)
Hemoglobin: 8.9 g/dL — ABNORMAL LOW (ref 12.0–15.0)
MCH: 31.9 pg (ref 26.0–34.0)
MCHC: 32.1 g/dL (ref 30.0–36.0)
MCV: 99.3 fL (ref 80.0–100.0)
Platelets: 73 10*3/uL — ABNORMAL LOW (ref 150–400)
RBC: 2.79 MIL/uL — ABNORMAL LOW (ref 3.87–5.11)
RDW: 14.6 % (ref 11.5–15.5)
WBC: 9 10*3/uL (ref 4.0–10.5)
nRBC: 0.8 % — ABNORMAL HIGH (ref 0.0–0.2)

## 2021-11-09 LAB — BASIC METABOLIC PANEL
Anion gap: 5 (ref 5–15)
BUN: 17 mg/dL (ref 6–20)
CO2: 26 mmol/L (ref 22–32)
Calcium: 7.4 mg/dL — ABNORMAL LOW (ref 8.9–10.3)
Chloride: 105 mmol/L (ref 98–111)
Creatinine, Ser: 1.04 mg/dL — ABNORMAL HIGH (ref 0.44–1.00)
GFR, Estimated: >60 mL/min (ref >60)
Glucose, Bld: 136 mg/dL — ABNORMAL HIGH (ref 70–99)
Potassium: 5 mmol/L (ref 3.5–5.1)
Sodium: 136 mmol/L (ref 135–145)

## 2021-11-09 NOTE — Progress Notes (Addendum)
Pt is not ready to be D/C. Discharge plan is home pending clinical improvement. Per MD notes: Barriers include cardiology recommendations given recent rash while undergoing A-fib medication regimen adjustments. Will continue to f/u to assist with the D/C plan.

## 2021-11-09 NOTE — Progress Notes (Signed)
Progress Note  Patient Name: Diamond Bowman Date of Encounter: 11/09/2021  Attending physician: Kinnie Feil, MD Primary care provider: Everardo Beals, NP  Subjective: Diamond Bowman is a 56 y.o. female who was seen and examined at bedside. In bed sleeping.  No CP, shortness of breath, or palpitations.  Remains in NSR.  Rash slowly improving.   Objective: Vital Signs in the last 24 hours: Temp:  [98.9 F (37.2 C)] 98.9 F (37.2 C) (06/17 2004) Pulse Rate:  [88-92] 88 (06/17 2004) Resp:  [18-19] 18 (06/17 2004) BP: (113-134)/(52-95) 113/95 (06/18 0936) SpO2:  [97 %-99 %] 97 % (06/17 2004)  Intake/Output:  Intake/Output Summary (Last 24 hours) at 11/09/2021 1532 Last data filed at 11/09/2021 0725 Gross per 24 hour  Intake 480 ml  Output 2325 ml  Net -1845 ml    Net IO Since Admission: -2,055.27 mL [11/09/21 1532]  Weights:  Filed Weights   11/06/21 0415 11/07/21 0529 11/08/21 0544  Weight: 56.1 kg 55.2 kg 56.3 kg    Telemetry: Personally reviewed. NSR.   Physical examination: PHYSICAL EXAM:    11/09/2021    9:36 AM 11/08/2021    8:04 PM 11/08/2021    7:57 PM  Vitals with BMI  Systolic 161 096   Diastolic 95 52   Pulse  88 92    CONSTITUTIONAL: No acute distress, , appears older than stated age, hemodynamically stable SKIN: Skin is warm and dry. No cyanosis. No pallor. No jaundice. Petechial rash / non pruritic noted (anterior/posterior torso, under breast tissue, abdomen, dorsum/ventral aspect of thighs.  HEAD: Normocephalic and atraumatic.  EYES: No scleral icterus MOUTH/THROAT: Moist oral membranes.  NECK: No JVD present. No thyromegaly noted. No carotid bruits  CHEST Normal respiratory effort. No intercostal retractions  LUNGS CTAB, kyphosis noted posteriorly CARDIOVASCULAR: Regular, S1S2, no murmurs rubs or gallops. ABDOMINAL: Soft, nontender, nondistended, positive bowel sounds in all 4 quadrants, no apparent ascites.  EXTREMITIES: No pitting  edema, warm to touch, 2+ bilateral DP and PT pulses HEMATOLOGIC: No significant bruising   Lab Results: Hematology Recent Labs  Lab 11/08/21 0141 11/08/21 0628 11/09/21 0203  WBC 7.3 6.2 9.0  RBC 2.84* 2.82* 2.79*  HGB 9.3* 9.1* 8.9*  HCT 27.6* 27.8* 27.7*  MCV 97.2 98.6 99.3  MCH 32.7 32.3 31.9  MCHC 33.7 32.7 32.1  RDW 14.6 14.7 14.6  PLT 60* 60* 73*    Chemistry Recent Labs  Lab 11/04/21 0826 11/05/21 0111 11/06/21 0359 11/08/21 0628 11/09/21 0203  NA 138   < > 140 141 136  K 4.2   < > 4.0 4.7 5.0  CL 101   < > 103 107 105  CO2 28   < > '27 27 26  '$ GLUCOSE 122*   < > 115* 79 136*  BUN 23*   < > '13 16 17  '$ CREATININE 1.35*   < > 0.91 1.12* 1.04*  CALCIUM 8.1*   < > 7.7* 7.7* 7.4*  PROT 5.6*  --   --   --   --   ALBUMIN 2.3*  --   --   --   --   AST 19  --   --   --   --   ALT 12  --   --   --   --   ALKPHOS 46  --   --   --   --   BILITOT 0.7  --   --   --   --  GFRNONAA 46*   < > >60 57* >60  ANIONGAP 9   < > '10 7 5   '$ < > = values in this interval not displayed.     Cardiac Enzymes: Cardiac Panel (last 3 results) No results for input(s): "CKTOTAL", "CKMB", "TROPONINIHS", "RELINDX" in the last 72 hours.  BNP (last 3 results) No results for input(s): "BNP" in the last 8760 hours.  ProBNP (last 3 results) No results for input(s): "PROBNP" in the last 8760 hours.   DDimer No results for input(s): "DDIMER" in the last 168 hours.   Hemoglobin A1c:  Lab Results  Component Value Date   HGBA1C 5.2 11/05/2021   MPG 102.54 11/05/2021    TSH  Recent Labs    11/04/21 0826  TSH 1.312    Lipid Panel No results found for: "CHOL", "TRIG", "HDL", "CHOLHDL", "VLDL", "LDLCALC", "LDLDIRECT"  Imaging: No results found.  CARDIAC DATABASE: EKG: 11/04/2021: Sinus tachycardia, 164 bpm with PACs and PVCs.   11/04/2021 9:31 AM: Narrow complex tachycardia likely atrial tach with frequent PACs, 181 bpm, ST-T changes likely rate related but ischemia cannot be ruled  out.   11/04/2021 12:56 PM: Sinus rhythm, 66 bpm, frequent PACs with atrial bigeminy, without underlying injury pattern.     Echocardiogram: 11/04/2021  1. Left ventricular ejection fraction, by estimation, is 55 to 60%. The  left ventricle has normal function. The left ventricle has no regional  wall motion abnormalities. Left ventricular diastolic parameters were  normal.   2. Right ventricular systolic function is normal. The right ventricular  size is normal.   3. The mitral valve is grossly normal. No evidence of mitral valve  regurgitation. No evidence of mitral stenosis.   4. The aortic valve was not well visualized. Aortic valve regurgitation  is not visualized. No aortic stenosis is present.   5. Pulmonic valve regurgitation not well visualized.   Scheduled Meds:  amiodarone  400 mg Oral BID   Followed by   Derrill Memo ON 11/22/2021] amiodarone  200 mg Oral Daily   divalproex  1,500 mg Oral QHS   docusate sodium  100 mg Oral Daily   hydrocerin   Topical BID   hydrOXYzine  50 mg Oral BID   levothyroxine  75 mcg Oral Daily   mirtazapine  15 mg Oral QHS   OLANZapine zydis  15 mg Oral QHS   Rivaroxaban  15 mg Oral Q supper   traZODone  300 mg Oral QHS   verapamil  240 mg Oral Daily    Continuous Infusions:    PRN Meds:    IMPRESSION & RECOMMENDATIONS: Diamond Bowman is a 56 y.o. Caucasian female whose past medical history and cardiac risk factors include: Documented history of mental retardation, schizophrenia, tardive dyskinesia, history of falls and fractures, thrombocytopenia, seizures, poor historian.  Impression: Paroxysmal Afib. - currently SR Rash - still present, not worsening.  Severe sepsis likely due to pneumonia - improved  Documented history of mental retardation. History of tardive  dyskinesia. History of fall/fractures. Thrombocytopenia. Seizures  Plan: Paroxysmal Afib.  Likely precipitated by her underlying severe sepsis due to PNA on  presentation.  She was IV Cardizem (max dose) and IV Amio drip w/ plans to undergo DCCV. However, she did not need cardioversion and reverted back to SR (11/07/2021).  She developed a rash on the night of 11/07/2021 and based on EMR IV Amio was d/c.  Spoke to pharmacy to review her MAR and see if any potential drug to drug  interaction could have caused this rash. Please refer to their note for details.  Patient remained on Xarelto, oral Amio, and Verapamil and her rash is improving but not resolved.  After discussing her care w/ Dr. Lovena Le, pharmacy staff and observing her the recommendations would be to:  Continue Amiodarone 400 mg bid for 2 weeks, followed by 200 mg po day (for two weeks), Verapamil 240 mg po daily, and Xarelto 15 mg po q supper (for total of four weeks from 11/07/2021).  Follow up as outpatient in 4 weeks.  IF she remains in sinus rhythm would recommend d/c amiodarone and Xarelto given her low CHA2DS2-Vasc score. Risk of bleeding would be greater than benefit due to baseline thrombocytopenia, hx of falls/fracture, tardive dyskinesia, and documented mental retardation.  IF she remains in Afib will need to make shared decision w/ her next of kin.  CHA2DS2-VASc SCORE is 1 which correlates to 1.3 % risk of stroke per year (gender).  TSH within normal limits. Echo: Preserved LVEF, normal diastolic function, no significant valvular heart disease. Continue telemetry  RASH:  Stable / improving  Patient is asymptomatic.  Hemodynamically is stable.  She also has baseline thrombocytopenia that may be contributing.   Have had a discussion w/ inpatient pharmacy, EP, and primary team regarding this.  IF it does not improve consider dermatology consult / evaluation will defer to primary team.  Defer management to primary team.  Severe sepsis likely due to pneumonia: Management per primary team.  Thrombocytopenia: Present on arrival. See labs above.  Monitor for bleeding. Management  to primary team.  Recommendations conveyed to primary team. Spoke to Dr. Lovena Le regarding her case as well. Will arrange outpatient follow up in 4 weeks. Will sign off for now and reach out if any questions or concerns arise.   This note was created using a voice recognition software as a result there may be grammatical errors inadvertently enclosed that do not reflect the nature of this encounter. Every attempt is made to correct such errors.  Total time spent: 40 minutes.   Mechele Claude [facility]  Pager: [phone] Office: [phone] 11/09/2021, 3:32 PM

## 2021-11-09 NOTE — Progress Notes (Signed)
Progress Note  Patient Name: Diamond Bowman Date of Encounter: 11/09/2021  Primary Cardiologist: None   Subjective   Unable to understand her speech.   Inpatient Medications    Scheduled Meds:  amiodarone  400 mg Oral BID   Followed by   Derrill Memo ON 11/22/2021] amiodarone  200 mg Oral Daily   divalproex  1,500 mg Oral QHS   docusate sodium  100 mg Oral Daily   hydrocerin   Topical BID   hydrOXYzine  50 mg Oral BID   levothyroxine  75 mcg Oral Daily   mirtazapine  15 mg Oral QHS   OLANZapine zydis  15 mg Oral QHS   Rivaroxaban  15 mg Oral Q supper   traZODone  300 mg Oral QHS   verapamil  240 mg Oral Daily   Continuous Infusions:  PRN Meds:    Vital Signs    Vitals:   11/08/21 1950 11/08/21 1957 11/08/21 2003 11/08/21 2004  BP: (!) 126/58   (!) 134/52  Pulse:  92  88  Resp: '19 19 18 18  '$ Temp:    98.9 F (37.2 C)  TempSrc:    Oral  SpO2:  97%  97%  Weight:      Height:        Intake/Output Summary (Last 24 hours) at 11/09/2021 0853 Last data filed at 11/09/2021 0725 Gross per 24 hour  Intake 480 ml  Output 2325 ml  Net -1845 ml   Filed Weights   11/06/21 0415 11/07/21 0529 11/08/21 0544  Weight: 56.1 kg 55.2 kg 56.3 kg    Telemetry    nsr - Personally Reviewed  ECG    none - Personally Reviewed  Physical Exam   GEN: No acute distress.   Neck: No JVD Cardiac: RRR, no murmurs, rubs, or gallops.  Respiratory: Clear to auscultation bilaterally. GI: Soft, nontender, non-distended  MS: No edema; No deformity. Neuro:  Nonfocal  Psych: Normal affect  Skin - rash improved.   Labs    Chemistry Recent Labs  Lab 11/04/21 0826 11/05/21 0111 11/06/21 0359 11/08/21 0628 11/09/21 0203  NA 138   < > 140 141 136  K 4.2   < > 4.0 4.7 5.0  CL 101   < > 103 107 105  CO2 28   < > '27 27 26  '$ GLUCOSE 122*   < > 115* 79 136*  BUN 23*   < > '13 16 17  '$ CREATININE 1.35*   < > 0.91 1.12* 1.04*  CALCIUM 8.1*   < > 7.7* 7.7* 7.4*  PROT 5.6*  --   --   --    --   ALBUMIN 2.3*  --   --   --   --   AST 19  --   --   --   --   ALT 12  --   --   --   --   ALKPHOS 46  --   --   --   --   BILITOT 0.7  --   --   --   --   GFRNONAA 46*   < > >60 57* >60  ANIONGAP 9   < > '10 7 5   '$ < > = values in this interval not displayed.     Hematology Recent Labs  Lab 11/08/21 0141 11/08/21 0628 11/09/21 0203  WBC 7.3 6.2 9.0  RBC 2.84* 2.82* 2.79*  HGB 9.3* 9.1* 8.9*  HCT 27.6* 27.8* 27.7*  MCV 97.2 98.6 99.3  MCH 32.7 32.3 31.9  MCHC 33.7 32.7 32.1  RDW 14.6 14.7 14.6  PLT 60* 60* 73*    Cardiac EnzymesNo results for input(s): "TROPONINI" in the last 168 hours. No results for input(s): "TROPIPOC" in the last 168 hours.   BNPNo results for input(s): "BNP", "PROBNP" in the last 168 hours.   DDimer No results for input(s): "DDIMER" in the last 168 hours.   Radiology    No results found.  Cardiac Studies   none  Patient Profile     56 y.o. female admitted with sepsis and atrial fib with a RVR  Assessment & Plan    Atrial fib - she is maintaining NSR off of amio. I would not restart. Rash - unclear if due to amio. But for now would not retry.  Disp - we will sign off. F/U with Dr. Terri Skains.  [facility] will sign off.   Medication Recommendations:  do not give amiodarone Other recommendations (labs, testing, etc):  none Follow up as an outpatient:  Dr. Terri Skains  For questions or updates, please contact [facility] Please consult [email or web address] for contact info under Cardiology/STEMI.      Signed, Cristopher Peru, MD  11/09/2021, 8:53 AM

## 2021-11-09 NOTE — Progress Notes (Signed)
     Daily Progress Note Intern Pager: [phone]  Patient name: Diamond Bowman Medical record number: [id number] Date of birth: 1966/02/23 Age: 56 y.o. Gender: female  Primary Care Provider: Everardo Beals, NP Consultants: Cardiology, orthopedic surgery, electrophysiology Code Status: Full  Pt Overview and Major Events to Date:  6/13: Admitted 6/16: Spontaneously converted to normal sinus rhythm  6/17: Developed maculopapular rash  Assessment and Plan: Diamond Bowman is a 56 year old female presenting with CAP who met sepsis criteria on admission and also noted to be in a-fib with RVR now in NSR. Pertinent PMH/PSH includes CKD stage 3, hypothyroidism, schizophrenia, intellectual disability, seizure disorder, MDD and tardive dyskinesia.  * Community acquired pneumonia Completed course of azithromycin and CTX. Satting appropriately. Blood cultures demonstrate no growth for 4 days.  Resolved.  Acute maculopapular rash Has not worsened since yesterday.  Vitals remained stable and patient does not seem bothered by rash.  Thrombocytopenia remains stable and no leukocytosis. -continue to monitor closely  Atrial fibrillation ([place]) Remains in NSR.  Medication plan pending cardiology recommendations given maculopapular rash possibly due to IV amiodarone. -Cardiology following, appreciate recommendations -Verapamil 240 mg p.o. daily -Amiodarone 400 mg twice daily x14 days, followed by amiodarone 200 mg daily -Continue xarelto   FEN/GI: Heart healthy PPx: Xarelto Dispo:Home with home health pending clinical improvement . Barriers include cardiology recommendations given recent rash while undergoing A-fib medication regimen adjustments.   Subjective:  States she has been eating and drinking.  Denies itching.  Objective: Temp:  [98.9 F (37.2 C)] 98.9 F (37.2 C) (06/17 2004) Pulse Rate:  [88-92] 88 (06/17 2004) Resp:  [18-19] 18 (06/17 2004) BP: (113-134)/(52-95) 113/95 (06/18  0936) SpO2:  [97 %-99 %] 97 % (06/17 2004) Physical Exam: General: Awake, alert, NAD Cardiovascular: RRR, no murmurs auscultated Respiratory: CTA B, normal WOB Skin: Maculopapular rash on thighs, upper extremities, chest and abdomen which does not appear to have worsened  Laboratory: Most recent CBC Lab Results  Component Value Date   WBC 9.0 11/09/2021   HGB 8.9 (L) 11/09/2021   HCT 27.7 (L) 11/09/2021   MCV 99.3 11/09/2021   PLT 73 (L) 11/09/2021   Most recent BMP    Latest Ref Rng & Units 11/09/2021    2:03 AM  BMP  Glucose 70 - 99 mg/dL 136   BUN 6 - 20 mg/dL 17   Creatinine 0.44 - 1.00 mg/dL 1.04   Sodium 135 - 145 mmol/L 136   Potassium 3.5 - 5.1 mmol/L 5.0   Chloride 98 - 111 mmol/L 105   CO2 22 - 32 mmol/L 26   Calcium 8.9 - 10.3 mg/dL 7.4    Imaging/Diagnostic Tests: No recent imaging results. Wells Guiles, DO 11/09/2021, 11:51 AM  PGY-1, [place] Intern pager: [phone], text pages welcome Secure chat group [place]

## 2021-11-09 NOTE — Progress Notes (Signed)
Occupational Therapy Treatment Patient Details Name: Diamond Bowman MRN: [id number] DOB: 1965/12/05 Today's Date: 11/09/2021   History of present illness Pt is a 56 y.o. female admitted 11/04/21 with cough, SOB, AMS; of note, pt also with recent injury to R index finger sustaining R proximal phalanx fx. Workup for CAP, narrow complex tachycardia. PMH includes bipolar, schizophrenia, mental retardation, tardive dyskinesia, MDD, CKD 3, hypothyroidism.   OT comments  Pt very pleasant.  Working on arranging bed side table with crayons as well as cups. Pt declined donning splint.  She said 'nah- leave it'   Recommendations for follow up therapy are one component of a multi-disciplinary discharge planning process, led by the attending physician.  Recommendations may be updated based on patient status, additional functional criteria and insurance authorization.    Follow Up Recommendations  Home health OT    Assistance Recommended at Discharge Frequent or constant Supervision/Assistance  Patient can return home with the following  A little help with walking and/or transfers;A lot of help with bathing/dressing/bathroom;Assistance with cooking/housework;Assistance with feeding;Direct supervision/assist for medications management;Assist for transportation;Help with stairs or ramp for entrance   Equipment Recommendations  Tub/shower seat    Recommendations for Other Services      Precautions / Restrictions Precautions Precautions: Fall;Other (comment) Precaution Comments: watch HR (extreme tachy) Required Braces or Orthoses: Splint/Cast Splint/Cast: R hand via OT Splint/Cast - Date Prophylactic Dressing Applied (if applicable):  (gel padding on splint on index finger) Restrictions Weight Bearing Restrictions: Yes Other Position/Activity Restrictions: per ortho MD - RUE WB through elbow or ulnar side of hand       Mobility Bed Mobility Overal bed mobility: Needs Assistance       Supine  to sit: Min assist, HOB elevated Sit to supine: Min assist        Transfers            Did not perform             Balance Overall balance assessment: Needs assistance   Sitting balance-Leahy Scale: Good                                     ADL either performed or assessed with clinical judgement   ADL Overall ADL's : Needs assistance/impaired Eating/Feeding: Sitting;Minimal assistance   Grooming: Wash/dry face;Set up;Bed level;Sitting                                 General ADL Comments: pt declined donning splint.  Pt wanted to color and sort crayons into bucket OT provided    Extremity/Trunk Assessment Upper Extremity Assessment RUE Deficits / Details: bruising @ index and middle fingers as well as palm; using R hand to color RUE Coordination: decreased fine motor             Cognition Arousal/Alertness: Awake/alert Behavior During Therapy: Flat affect, Impulsive Overall Cognitive Status: History of cognitive impairments - at baseline                                 General Comments: most likely at baseline; follow commands and engaging in simple conversation. pt is very pleasant. Wants to share crayons and crackers  Pertinent Vitals/ Pain       Pain Assessment Pain Assessment: No/denies pain      Progress Toward Goals  OT Goals(current goals can now be found in the care plan section)  Progress towards OT goals: Progressing toward goals     Plan Discharge plan remains appropriate          End of Session    OT Visit Diagnosis: Unsteadiness on feet (R26.81);Other abnormalities of gait and mobility (R26.89);Muscle weakness (generalized) (M62.81);Other symptoms and signs involving cognitive function   Activity Tolerance Patient tolerated treatment well   Patient Left in bed;with call bell/phone within reach;with bed alarm set   Nurse Communication Mobility status         Time: 1220-1040 OT Time Calculation (min): 1340 min  Charges: OT General Charges $OT Visit: 1 Visit OT Treatments $Self Care/Home Management : 8-22 mins  Kari Baars, [place] [phone] Office- [place] D 11/09/2021, 11:19 AM

## 2021-11-10 DIAGNOSIS — K5900 Constipation, unspecified: Secondary | ICD-10-CM

## 2021-11-10 DIAGNOSIS — E875 Hyperkalemia: Secondary | ICD-10-CM

## 2021-11-10 DIAGNOSIS — J189 Pneumonia, unspecified organism: Secondary | ICD-10-CM | POA: Diagnosis not present

## 2021-11-10 LAB — CBC
HCT: 30.6 % — ABNORMAL LOW (ref 36.0–46.0)
Hemoglobin: 10.3 g/dL — ABNORMAL LOW (ref 12.0–15.0)
MCH: 32.8 pg (ref 26.0–34.0)
MCHC: 33.7 g/dL (ref 30.0–36.0)
MCV: 97.5 fL (ref 80.0–100.0)
Platelets: 103 10*3/uL — ABNORMAL LOW (ref 150–400)
RBC: 3.14 MIL/uL — ABNORMAL LOW (ref 3.87–5.11)
RDW: 14.6 % (ref 11.5–15.5)
WBC: 11.9 10*3/uL — ABNORMAL HIGH (ref 4.0–10.5)
nRBC: 0.6 % — ABNORMAL HIGH (ref 0.0–0.2)

## 2021-11-10 LAB — BASIC METABOLIC PANEL
Anion gap: 9 (ref 5–15)
BUN: 16 mg/dL (ref 6–20)
CO2: 30 mmol/L (ref 22–32)
Calcium: 7.8 mg/dL — ABNORMAL LOW (ref 8.9–10.3)
Chloride: 101 mmol/L (ref 98–111)
Creatinine, Ser: 1 mg/dL (ref 0.44–1.00)
GFR, Estimated: >60 mL/min (ref >60)
Glucose, Bld: 97 mg/dL (ref 70–99)
Potassium: 5.2 mmol/L — ABNORMAL HIGH (ref 3.5–5.1)
Sodium: 140 mmol/L (ref 135–145)

## 2021-11-10 MED ORDER — PREDNISONE 20 MG PO TABS
20.0000 mg | ORAL_TABLET | Freq: Every day | ORAL | Status: DC
Start: 2021-11-10 — End: 2021-11-11
  Administered 2021-11-10 – 2021-11-11 (×2): 20 mg via ORAL
  Filled 2021-11-10 (×2): qty 1

## 2021-11-10 MED ORDER — POLYETHYLENE GLYCOL 3350 17 G PO PACK
17.0000 g | PACK | Freq: Every day | ORAL | Status: DC
Start: 2021-11-10 — End: 2021-11-11
  Administered 2021-11-10 – 2021-11-11 (×2): 17 g via ORAL
  Filled 2021-11-10 (×2): qty 1

## 2021-11-10 MED ORDER — HYDROXYZINE HCL 25 MG PO TABS
75.0000 mg | ORAL_TABLET | Freq: Three times a day (TID) | ORAL | Status: DC
  Administered 2021-11-10 – 2021-11-12 (×7): 75 mg via ORAL
  Filled 2021-11-10 (×7): qty 3

## 2021-11-10 MED ORDER — DIPHENHYDRAMINE HCL 25 MG PO CAPS
25.0000 mg | ORAL_CAPSULE | Freq: Three times a day (TID) | ORAL | Status: DC
Start: 2021-11-10 — End: 2021-11-11
  Administered 2021-11-10 – 2021-11-11 (×4): 25 mg via ORAL
  Filled 2021-11-10 (×4): qty 1

## 2021-11-10 MED ORDER — HYDROCORTISONE 1 % EX CREA
TOPICAL_CREAM | Freq: Two times a day (BID) | CUTANEOUS | Status: DC
  Filled 2021-11-10: qty 28

## 2021-11-10 MED ORDER — SENNOSIDES-DOCUSATE SODIUM 8.6-50 MG PO TABS
1.0000 | ORAL_TABLET | Freq: Every day | ORAL | Status: DC
Start: 2021-11-10 — End: 2021-11-11
  Administered 2021-11-10 – 2021-11-11 (×2): 1 via ORAL
  Filled 2021-11-10 (×2): qty 1

## 2021-11-10 MED ORDER — APIXABAN 5 MG PO TABS
5.0000 mg | ORAL_TABLET | Freq: Two times a day (BID) | ORAL | Status: DC
  Administered 2021-11-10 – 2021-11-14 (×9): 5 mg via ORAL
  Filled 2021-11-10 (×9): qty 1

## 2021-11-10 NOTE — Progress Notes (Signed)
FPTS Brief Note Reviewed patient's vitals, recent notes.  Vitals:   11/10/21 1300 11/10/21 2040  BP: 108/85 (!) 109/54  Pulse: 100 100  Resp: 16 16  Temp: 98 F (36.7 C) 98.8 F (37.1 C)  SpO2: 90% 92%   At this time, no change in plan from day progress note.  Donney Dice, DO Page [phone] with questions about this patient.

## 2021-11-10 NOTE — Assessment & Plan Note (Signed)
Not stooled since 6/14. Apparently incorrectly charted BM on 6/17.  -Start Miralax and Senna daily

## 2021-11-10 NOTE — Plan of Care (Signed)

## 2021-11-10 NOTE — Progress Notes (Signed)
Mobility Specialist Progress Note    11/10/21 1653  Mobility  Activity Ambulated with assistance in room  Level of Assistance Contact guard assist, steadying assist  Assistive Device Front wheel walker  Distance Ambulated (ft) 20 ft  Activity Response Tolerated fair  $Mobility charge 1 Mobility   Pre-Mobility:118 HR, 94% SpO2 During Mobility: 136 HR Post-Mobility: 131 HR  Pt received in bed needing max encouragement. Agreeable to walk to door for chocolate pudding, shasta, and warm blanket. No complaints. Returned to bed with alarm on. RN aware of HR.   Hildred Alamin Mobility Specialist

## 2021-11-10 NOTE — Progress Notes (Signed)
Spoke with Dr. Terri Skains with cardiology regarding recurrence of rash.  Amiodarone has been discontinued, he agrees with discontinuation of amiodarone.  Also recommended switching anticoagulation from rivaroxaban to apixaban.  Antibiotics could have been contributing though she has already completed her antibiotic course.  Consider observation for 1 additional day given worsening of rash, he will see patient tomorrow if patient is still here.  Request to update him on disposition planning via secure chat.

## 2021-11-10 NOTE — Plan of Care (Signed)
  Problem: Education: Goal: Knowledge of General Education information will improve Description: Including pain rating scale, medication(s)/side effects and non-pharmacologic comfort measures 11/10/2021 2315 by Robley Fries, RN Outcome: Progressing 11/10/2021 2314 by Robley Fries, RN Outcome: Progressing   Problem: Health Behavior/Discharge Planning: Goal: Ability to manage health-related needs will improve 11/10/2021 2315 by Robley Fries, RN Outcome: Progressing 11/10/2021 2314 by Robley Fries, RN Outcome: Progressing   Problem: Clinical Measurements: Goal: Ability to maintain clinical measurements within normal limits will improve 11/10/2021 2315 by Robley Fries, RN Outcome: Progressing 11/10/2021 2314 by Robley Fries, RN Outcome: Progressing Goal: Will remain free from infection 11/10/2021 2315 by Robley Fries, RN Outcome: Progressing 11/10/2021 2314 by Robley Fries, RN Outcome: Progressing Goal: Diagnostic test results will improve 11/10/2021 2315 by Robley Fries, RN Outcome: Progressing 11/10/2021 2314 by Robley Fries, RN Outcome: Progressing Goal: Respiratory complications will improve 11/10/2021 2315 by Robley Fries, RN Outcome: Progressing 11/10/2021 2314 by Robley Fries, RN Outcome: Progressing Goal: Cardiovascular complication will be avoided 11/10/2021 2315 by Robley Fries, RN Outcome: Progressing 11/10/2021 2314 by Robley Fries, RN Outcome: Progressing   Problem: Activity: Goal: Risk for activity intolerance will decrease 11/10/2021 2315 by Robley Fries, RN Outcome: Progressing 11/10/2021 2314 by Robley Fries, RN Outcome: Progressing   Problem: Nutrition: Goal: Adequate nutrition will be maintained 11/10/2021 2315 by Robley Fries, RN Outcome: Progressing 11/10/2021 2314 by Robley Fries, RN Outcome: Progressing   Problem: Coping: Goal: Level of anxiety will decrease 11/10/2021 2315 by Robley Fries, RN Outcome: Progressing 11/10/2021 2314 by Robley Fries, RN Outcome: Progressing   Problem: Elimination: Goal: Will not experience complications related to bowel motility 11/10/2021 2315 by Robley Fries, RN Outcome: Progressing 11/10/2021 2314 by Robley Fries, RN Outcome: Progressing Goal: Will not experience complications related to urinary retention 11/10/2021 2315 by Robley Fries, RN Outcome: Progressing 11/10/2021 2314 by Robley Fries, RN Outcome: Progressing   Problem: Pain Managment: Goal: General experience of comfort will improve 11/10/2021 2315 by Robley Fries, RN Outcome: Progressing 11/10/2021 2314 by Robley Fries, RN Outcome: Progressing   Problem: Safety: Goal: Ability to remain free from injury will improve 11/10/2021 2315 by Robley Fries, RN Outcome: Progressing 11/10/2021 2314 by Robley Fries, RN Outcome: Progressing   Problem: Skin Integrity: Goal: Risk for impaired skin integrity will decrease 11/10/2021 2315 by Robley Fries, RN Outcome: Progressing 11/10/2021 2314 by Robley Fries, RN Outcome: Progressing

## 2021-11-10 NOTE — Progress Notes (Signed)
     Daily Progress Note Intern Pager: [phone]  Patient name: Diamond Bowman Medical record number: [id number] Date of birth: [birth year] Age: 56 y.o. Gender: female  Primary Care Provider: Everardo Beals, NP Consultants: Cardiology, orthopedic surgery, electrophysiology Code Status: Full  Pt Overview and Major Events to Date:  6/13: Admitted 6/16: Spontaneously converted to normal sinus rhythm  6/17: Developed maculopapular rash  Assessment and Plan: Diamond Bowman is a 56 year old female presenting with CAP who met sepsis criteria on admission and also noted to be in a-fib with RVR now controlled in NSR. Pertinent PMH/PSH includes CKD stage 3, hypothyroidism, schizophrenia, intellectual disability, seizure disorder, MDD and tardive dyskinesia.  * Atrial fibrillation ([facility]) Remains in NSR.  -Verapamil 240 mg p.o. daily -Discontinue Amiodarone -Change Xarelto to Eliquis 5mg  BID -Follow-up with cardiology outpatient in 4 weeks  Hyperkalemia Potassium has been slowly increasing, borderline elevated at 5.2 this AM. Not on any potassium-sparing medications. Also has not stooled since 6/14 which may be contributing. -monitor BMP  Constipation Not stooled since 6/14. Apparently incorrectly charted BM on 6/17.  -Start Miralax and Senna daily  Acute maculopapular rash Vitals remained stable however rash has significantly worsened. Discussed with Cardiology and advised to discontinue potential offending agents (amiodarone and Xarelto) -Benadryl 25mg  every 8 hours -Prednisone 20mg  daily (day 1/3) -Hydrocortisone cream 1% -Hydroxyzine 75mg  TID -continue to monitor  FEN/GI: Heart healthy PPx: Xarelto Dispo:Home with home health tomorrow. Barriers include therapeutic provider will be returning tomorrow.   Subjective:  States she is very itchy and scratchy this morning. She is eating and drinking well and enjoying her coloring book.  Objective: Temp:  [98 F (36.7 C)-98.1 F  (36.7 C)] 98 F (36.7 C) (06/19 0900) Pulse Rate:  [83-105] 105 (06/19 0900) Resp:  [18-19] 18 (06/19 0900) BP: (96-123)/(65-91) 96/76 (06/19 0900) SpO2:  [93 %-96 %] 93 % (06/19 0900) Weight:  [56.3 kg] 56.3 kg (06/19 0419) Physical Exam: General: awake, alert, uncomfortably itching Cardiovascular: RRR, no murmurs auscultated Respiratory: CTAB, normal WOB Skin: widespread maculopapular rash on chest, abdomen, back, and lower extremities significantly worsened from prior           Laboratory: Most recent CBC Lab Results  Component Value Date   WBC 11.9 (H) 11/10/2021   HGB 10.3 (L) 11/10/2021   HCT 30.6 (L) 11/10/2021   MCV 97.5 11/10/2021   PLT 103 (L) 11/10/2021   Most recent BMP    Latest Ref Rng & Units 11/10/2021    3:16 AM  BMP  Glucose 70 - 99 mg/dL 97   BUN 6 - 20 mg/dL 16   Creatinine 0.44 - 1.00 mg/dL 1.00   Sodium 135 - 145 mmol/L 140   Potassium 3.5 - 5.1 mmol/L 5.2   Chloride 98 - 111 mmol/L 101   CO2 22 - 32 mmol/L 30   Calcium 8.9 - 10.3 mg/dL 7.8    Imaging/Diagnostic Tests: No recent imaging results. Wells Guiles, DO 11/10/2021, 12:13 PM  PGY-1, [place] Intern pager: [phone], text pages welcome Secure chat group [place]

## 2021-11-10 NOTE — Progress Notes (Signed)
FPTS Brief Note Reviewed patient's vitals, recent notes.  Vitals:   11/09/21 0936 11/09/21 2153  BP: (!) 113/95 (!) 123/91  Pulse:  83  Resp:  19  Temp:  98.1 F (36.7 C)  SpO2:  96%   At this time, no change in plan from day progress note.   Donney Dice, DO Page [phone] with questions about this patient.

## 2021-11-10 NOTE — Assessment & Plan Note (Signed)
Potassium has been slowly increasing, remains borderline elevated at 5.2 this AM. Not on any potassium-sparing medications. Not stooled since 6/14 which may be contributing. -monitor BMP

## 2021-11-10 NOTE — Progress Notes (Signed)
Occupational Therapy Treatment Patient Details Name: Diamond Bowman MRN: [id number] DOB: Feb 17, 1966 Today's Date: 11/10/2021   History of present illness Pt is a 56 y.o. female admitted 11/04/21 with cough, SOB, AMS; of note, pt also with recent injury to R index finger sustaining R proximal phalanx fx. Workup for CAP, narrow complex tachycardia. PMH includes bipolar, schizophrenia, mental retardation, tardive dyskinesia, MDD, CKD 3, hypothyroidism.   OT comments  Pt declined any mobility OOB despite multiple attempts. Pt not wearing splint on entry. R hand splint donned and educated nsg on need to wear splint as pt has a R index finger fx. Splint may be removed intermittently throughout the dayt check for any pressure and clean hand, then reapply splint. Will continue to follow.    Recommendations for follow up therapy are one component of a multi-disciplinary discharge planning process, led by the attending physician.  Recommendations may be updated based on patient status, additional functional criteria and insurance authorization.    Follow Up Recommendations  Home health OT    Assistance Recommended at Discharge Frequent or constant Supervision/Assistance  Patient can return home with the following  A little help with walking and/or transfers;A lot of help with bathing/dressing/bathroom;Assistance with cooking/housework;Assistance with feeding;Direct supervision/assist for medications management;Assist for transportation;Help with stairs or ramp for entrance   Equipment Recommendations  Tub/shower seat    Recommendations for Other Services      Precautions / Restrictions Precautions Precautions: Fall Precaution Comments: watch HR (extreme tachy) Required Braces or Orthoses: Splint/Cast Splint/Cast: R hand via OT (R index fx) Restrictions Other Position/Activity Restrictions: per ortho MD - RUE WB through elbow or ulnar side of hand       Mobility Bed Mobility Overal bed  mobility: Modified Independent             General bed mobility comments: able to scoot up in bed independently; placed in chair position    Transfers                   General transfer comment: pt declined despite multiple attempts     Balance                                           ADL either performed or assessed with clinical judgement   ADL   Eating/Feeding: Set up Eating/Feeding Details (indicate cue type and reason): using red tubing while wearing splint                                        Extremity/Trunk Assessment              Vision       Perception     Praxis      Cognition Arousal/Alertness: Awake/alert Behavior During Therapy: Restless Overall Cognitive Status: History of cognitive impairments - at baseline                                          Exercises      Shoulder Instructions       General Comments      Pertinent Vitals/ Pain       Pain Assessment Pain Assessment: No/denies pain  Home Living                                          Prior Functioning/Environment              Frequency  Min 3X/week        Progress Toward Goals  OT Goals(current goals can now be found in the care plan section)  Progress towards OT goals: Progressing toward goals  Acute Rehab OT Goals Patient Stated Goal: to eat chocolate pudding OT Goal Formulation: With patient/family Time For Goal Achievement: 11/19/21 Potential to Achieve Goals: Good ADL Goals Pt Will Perform Eating: with set-up;sitting Pt Will Transfer to Toilet: with min guard assist;ambulating;regular height toilet Additional ADL Goal #1: Caregiver will demonstrate skin check and donning/doffing of splint independently  Plan Discharge plan remains appropriate    Co-evaluation                 AM-PAC OT "6 Clicks" Daily Activity     Outcome Measure   Help from another person  eating meals?: A Little Help from another person taking care of personal grooming?: A Lot Help from another person toileting, which includes using toliet, bedpan, or urinal?: Total Help from another person bathing (including washing, rinsing, drying)?: A Lot Help from another person to put on and taking off regular upper body clothing?: A Lot Help from another person to put on and taking off regular lower body clothing?: A Lot 6 Click Score: 12    End of Session    OT Visit Diagnosis: Unsteadiness on feet (R26.81);Other abnormalities of gait and mobility (R26.89);Muscle weakness (generalized) (M62.81);Other symptoms and signs involving cognitive function   Activity Tolerance Other (comment) (pt limited OOB activity)   Patient Left in bed;with call bell/phone within reach;with bed alarm set   Nurse Communication Mobility status;Other (comment) (needs to wear R finger slint)        Time: 7616-0737 OT Time Calculation (min): 25 min  Charges: OT General Charges $OT Visit: 1 Visit OT Treatments $Self Care/Home Management : 8-22 mins $Orthotics/Prosthetics Check: 8-22 mins  Maurie Boettcher, OT/L   Acute OT Clinical Specialist Acute Rehabilitation Services Pager [phone] Office [phone]   [facility] 11/10/2021, 3:09 PM

## 2021-11-11 DIAGNOSIS — J189 Pneumonia, unspecified organism: Secondary | ICD-10-CM | POA: Diagnosis not present

## 2021-11-11 DIAGNOSIS — I4891 Unspecified atrial fibrillation: Secondary | ICD-10-CM | POA: Diagnosis not present

## 2021-11-11 DIAGNOSIS — R21 Rash and other nonspecific skin eruption: Secondary | ICD-10-CM

## 2021-11-11 DIAGNOSIS — L720 Epidermal cyst: Secondary | ICD-10-CM | POA: Insufficient documentation

## 2021-11-11 LAB — BASIC METABOLIC PANEL
Anion gap: 8 (ref 5–15)
BUN: 19 mg/dL (ref 6–20)
CO2: 30 mmol/L (ref 22–32)
Calcium: 7.9 mg/dL — ABNORMAL LOW (ref 8.9–10.3)
Chloride: 94 mmol/L — ABNORMAL LOW (ref 98–111)
Creatinine, Ser: 1.22 mg/dL — ABNORMAL HIGH (ref 0.44–1.00)
GFR, Estimated: 51 mL/min — ABNORMAL LOW (ref >60)
Glucose, Bld: 103 mg/dL — ABNORMAL HIGH (ref 70–99)
Potassium: 5.2 mmol/L — ABNORMAL HIGH (ref 3.5–5.1)
Sodium: 132 mmol/L — ABNORMAL LOW (ref 135–145)

## 2021-11-11 MED ORDER — FAMOTIDINE 20 MG PO TABS
20.0000 mg | ORAL_TABLET | Freq: Every day | ORAL | Status: DC
  Administered 2021-11-11 – 2021-11-13 (×3): 20 mg via ORAL
  Filled 2021-11-11 (×3): qty 1

## 2021-11-11 MED ORDER — TRIAMCINOLONE ACETONIDE 0.1 % EX CREA
TOPICAL_CREAM | Freq: Two times a day (BID) | CUTANEOUS | Status: DC
  Filled 2021-11-11: qty 15

## 2021-11-11 MED ORDER — DIPHENHYDRAMINE HCL 25 MG PO CAPS
50.0000 mg | ORAL_CAPSULE | Freq: Four times a day (QID) | ORAL | Status: DC
  Administered 2021-11-11 (×2): 50 mg via ORAL
  Filled 2021-11-11 (×2): qty 2

## 2021-11-11 MED ORDER — PREDNISONE 20 MG PO TABS
40.0000 mg | ORAL_TABLET | Freq: Every day | ORAL | Status: AC
  Administered 2021-11-12 – 2021-11-13 (×2): 40 mg via ORAL
  Filled 2021-11-11 (×2): qty 2

## 2021-11-11 MED ORDER — TRIAMCINOLONE ACETONIDE 0.1 % EX CREA
TOPICAL_CREAM | Freq: Two times a day (BID) | CUTANEOUS | Status: DC
Start: 2021-11-11 — End: 2021-11-14
  Administered 2021-11-14: 1 via TOPICAL
  Filled 2021-11-11: qty 454

## 2021-11-11 MED ORDER — ACETAMINOPHEN 325 MG PO TABS
650.0000 mg | ORAL_TABLET | Freq: Three times a day (TID) | ORAL | Status: DC
  Administered 2021-11-11 – 2021-11-14 (×9): 650 mg via ORAL
  Filled 2021-11-11 (×9): qty 2

## 2021-11-11 MED ORDER — POLYETHYLENE GLYCOL 3350 17 G PO PACK
17.0000 g | PACK | Freq: Two times a day (BID) | ORAL | Status: DC
  Administered 2021-11-11 – 2021-11-12 (×2): 17 g via ORAL
  Filled 2021-11-11 (×3): qty 1

## 2021-11-11 MED ORDER — METOPROLOL SUCCINATE ER 100 MG PO TB24
100.0000 mg | ORAL_TABLET | Freq: Every day | ORAL | Status: DC
  Filled 2021-11-11: qty 1

## 2021-11-11 MED ORDER — SENNOSIDES-DOCUSATE SODIUM 8.6-50 MG PO TABS
1.0000 | ORAL_TABLET | Freq: Two times a day (BID) | ORAL | Status: DC
  Administered 2021-11-11 – 2021-11-12 (×2): 1 via ORAL
  Filled 2021-11-11 (×3): qty 1

## 2021-11-11 NOTE — Progress Notes (Signed)
Occupational Therapy Treatment Patient Details Name: Diamond Bowman MRN: [id number] DOB: [birth year] Today's Date: 11/11/2021   History of present illness Pt is a 56 y.o. female admitted 11/04/21 with cough, SOB, AMS; of note, pt also with recent injury to R index finger sustaining R proximal phalanx fx. Workup for CAP, narrow complex tachycardia. PMH includes bipolar, schizophrenia, mental retardation, tardive dyskinesia, MDD, CKD 3, hypothyroidism.   OT comments  Donned R hand splint and educated nsg staff on need to wear splint due to R index finger fx. Required Max encouragement to get out of urine soaked bed to bath and change linens. Pt mobilizes with HHA and able to assist with ADL as she is willing. Pt spending more time in bed and falling asleep easily. Recommend pt ambulate with staff and mobility techs. Will continue to follow.    Recommendations for follow up therapy are one component of a multi-disciplinary discharge planning process, led by the attending physician.  Recommendations may be updated based on patient status, additional functional criteria and insurance authorization.    Follow Up Recommendations  Home health OT    Assistance Recommended at Discharge Frequent or constant Supervision/Assistance  Patient can return home with the following  A little help with walking and/or transfers;A lot of help with bathing/dressing/bathroom;Assistance with cooking/housework;Assistance with feeding;Direct supervision/assist for medications management;Assist for transportation;Help with stairs or ramp for entrance   Equipment Recommendations  Tub/shower seat    Recommendations for Other Services      Precautions / Restrictions Precautions Precautions: Fall Precaution Comments: watch HR (extreme tachy) Required Braces or Orthoses: Splint/Cast Splint/Cast: R hand via OT (R index fx) Splint/Cast - Date Prophylactic Dressing Applied (if applicable):  (pt refused and pulled  off) Restrictions Weight Bearing Restrictions: No Other Position/Activity Restrictions: per ortho MD - RUE WB through elbow or ulnar side of hand       Mobility Bed Mobility Overal bed mobility: Modified Independent                  Transfers Overall transfer level: Needs assistance   Transfers: Sit to/from Stand Sit to Stand: Min guard                 Balance Overall balance assessment: Needs assistance   Sitting balance-Leahy Scale: Good Sitting balance - Comments: static sitting   Standing balance support: Single extremity supported, Bilateral upper extremity supported, During functional activity Standing balance-Leahy Scale: Fair Standing balance comment: able to walk without UE support but tends to reach for support and rW for hall gait                           ADL either performed or assessed with clinical judgement   ADL Overall ADL's : Needs assistance/impaired Eating/Feeding: Set up Eating/Feeding Details (indicate cue type and reason): using red tubing while wearing splint Grooming: Wash/dry face;Set up;Bed level;Sitting   Upper Body Bathing: Minimal assistance   Lower Body Bathing: Moderate assistance   Upper Body Dressing : Moderate assistance   Lower Body Dressing: Moderate assistance   Toilet Transfer: Minimal assistance Toilet Transfer Details (indicate cue type and reason): HHA Toileting- Clothing Manipulation and Hygiene: Total assistance Toileting - Clothing Manipulation Details (indicate cue type and reason): incontinenet     Functional mobility during ADLs: Minimal assistance General ADL Comments: Pt able to assist wtih bathing wehn she wants to complete task; transferred OOB to chair to help pt with bathing as bed  was soaked with urine; pt assisted with donning brief    Extremity/Trunk Assessment Upper Extremity Assessment Upper Extremity Assessment: RUE deficits/detail RUE Deficits / Details: bruising @ index and  middle fingers as well as palm; index finger splint not being wron - donned and fits well; pt allows splint to be placed RUE Coordination: decreased fine motor   Lower Extremity Assessment Lower Extremity Assessment: Defer to PT evaluation        Vision       Perception     Praxis      Cognition Arousal/Alertness: Awake/alert Behavior During Therapy: Restless Overall Cognitive Status: History of cognitive impairments - at baseline                                 General Comments: most likely at baseline; follow commands and engaging in simple conversation. wanting to spend more time in the bed; Max encouragement to get OOB        Exercises      Shoulder Instructions       General Comments      Pertinent Vitals/ Pain       Pain Assessment Pain Assessment: No/denies pain Faces Pain Scale: No hurt Breathing: normal Negative Vocalization: none Facial Expression: smiling or inexpressive Body Language: relaxed Consolability: no need to console PAINAD Score: 0  Home Living                                          Prior Functioning/Environment              Frequency  Min 3X/week        Progress Toward Goals  OT Goals(current goals can now be found in the care plan section)  Progress towards OT goals: Progressing toward goals  Acute Rehab OT Goals Patient Stated Goal: to get back in bed OT Goal Formulation: With patient/family Time For Goal Achievement: 11/19/21 Potential to Achieve Goals: Good ADL Goals Pt Will Perform Eating: with set-up;sitting Pt Will Transfer to Toilet: with min guard assist;ambulating;regular height toilet Additional ADL Goal #1: Caregiver will demonstrate skin check and donning/doffing of splint independently  Plan Discharge plan remains appropriate    Co-evaluation    PT/OT/SLP Co-Evaluation/Treatment:  (partial session)            AM-PAC OT "6 Clicks" Daily Activity     Outcome  Measure   Help from another person eating meals?: A Little Help from another person taking care of personal grooming?: A Lot Help from another person toileting, which includes using toliet, bedpan, or urinal?: Total Help from another person bathing (including washing, rinsing, drying)?: A Lot Help from another person to put on and taking off regular upper body clothing?: A Lot Help from another person to put on and taking off regular lower body clothing?: A Lot 6 Click Score: 12    End of Session Equipment Utilized During Treatment:  (splint)  OT Visit Diagnosis: Unsteadiness on feet (R26.81);Other abnormalities of gait and mobility (R26.89);Muscle weakness (generalized) (M62.81);Other symptoms and signs involving cognitive function   Activity Tolerance Other (comment) (pt limited OOB activity)   Patient Left in bed;with call bell/phone within reach;with bed alarm set   Nurse Communication Mobility status;Other (comment) (needs to wear R finger splint)        Time: 0092-3300 OT Time  Calculation (min): 20 min  Charges: OT General Charges $OT Visit: 1 Visit OT Treatments $Self Care/Home Management : 8-22 mins  Maurie Boettcher, OT/L   Acute OT Clinical Specialist [place] Pager [phone] Office [phone]   [facility] 11/11/2021, 11:39 AM

## 2021-11-11 NOTE — Progress Notes (Signed)
Physical Therapy Treatment Patient Details Name: Diamond Bowman MRN: [id number] DOB: 1965/10/08 Today's Date: 11/11/2021   History of Present Illness Pt is a 56 y.o. female admitted 11/04/21 with cough, SOB, AMS; of note, pt also with recent injury to R index finger sustaining R proximal phalanx fx. Workup for CAP, narrow complex tachycardia. PMH includes bipolar, schizophrenia, mental retardation, tardive dyskinesia, MDD, CKD 3, hypothyroidism.    PT Comments    Pt agreeable to OOB mobility with max encouragement and gamifying of session with "scavenger hunt" in hall to find pudding cup. Pt supervision for all bed mobility and min guard to come to standing. Pt needing min assist throughout ambulation for safety as pt moves quickly and impulsively, with poor safety and environmental awareness often running into furniture and obstacles despite cues to slow gait speed and for visual scanning. Overall pt very pleasant and tolerated OOB mobility well. Pt continues to benefit from skilled PT services to progress toward functional mobility goals.    Recommendations for follow up therapy are one component of a multi-disciplinary discharge planning process, led by the attending physician.  Recommendations may be updated based on patient status, additional functional criteria and insurance authorization.  Follow Up Recommendations  Home health PT     Assistance Recommended at Discharge Frequent or constant Supervision/Assistance  Patient can return home with the following A little help with walking and/or transfers;A lot of help with bathing/dressing/bathroom;Assistance with cooking/housework;Assistance with feeding;Direct supervision/assist for medications management;Direct supervision/assist for financial management;Assist for transportation;Help with stairs or ramp for entrance   Equipment Recommendations  None recommended by PT    Recommendations for Other Services       Precautions /  Restrictions Precautions Precautions: Fall Precaution Comments: watch HR (extreme tachy) Required Braces or Orthoses: Splint/Cast Splint/Cast: R hand via OT (R index fx) Splint/Cast - Date Prophylactic Dressing Applied (if applicable):  (pt refused and pulled off) Restrictions Weight Bearing Restrictions: No Other Position/Activity Restrictions: per ortho MD - RUE WB through elbow or ulnar side of hand     Mobility  Bed Mobility Overal bed mobility: Modified Independent                  Transfers Overall transfer level: Needs assistance Equipment used: None Transfers: Sit to/from Stand Sit to Stand: Min guard           General transfer comment: min gaurd for safety    Ambulation/Gait Ambulation/Gait assistance: Min guard Gait Distance (Feet): 50 Feet Assistive device: Rolling walker (2 wheels) Gait Pattern/deviations: Step-through pattern, Decreased stride length, Trunk flexed Gait velocity: too quick for pt safety     General Gait Details: pt with use of RW with quick gait with assist for lines and direction, pt impulsive with return to room and ditching RW at sink with lack of awareness for environment needing assist to not run into Scientist, water quality    Modified Rankin (Stroke Patients Only)       Balance Overall balance assessment: Needs assistance   Sitting balance-Leahy Scale: Good Sitting balance - Comments: static sitting   Standing balance support: Single extremity supported, Bilateral upper extremity supported, During functional activity Standing balance-Leahy Scale: Fair Standing balance comment: able to walk without UE support but tends to reach for support and RW for hall gait  Cognition Arousal/Alertness: Awake/alert Behavior During Therapy: Restless Overall Cognitive Status: History of cognitive impairments - at baseline                                  General Comments: most likely at baseline; follow commands and engaging in simple conversation. wanting to spend more time in the bed; Max encouragement to get OOB        Exercises      General Comments        Pertinent Vitals/Pain Pain Assessment Pain Assessment: No/denies pain    Home Living                          Prior Function            PT Goals (current goals can now be found in the care plan section) Acute Rehab PT Goals PT Goal Formulation: With patient Time For Goal Achievement: 11/19/21    Frequency    Min 3X/week      PT Plan Current plan remains appropriate    Co-evaluation              AM-PAC PT "6 Clicks" Mobility   Outcome Measure  Help needed turning from your back to your side while in a flat bed without using bedrails?: A Little Help needed moving from lying on your back to sitting on the side of a flat bed without using bedrails?: A Little Help needed moving to and from a bed to a chair (including a wheelchair)?: A Little Help needed standing up from a chair using your arms (e.g., wheelchair or bedside chair)?: A Little Help needed to walk in hospital room?: A Little Help needed climbing 3-5 steps with a railing? : A Lot 6 Click Score: 17    End of Session Equipment Utilized During Treatment: Gait belt Activity Tolerance: Patient tolerated treatment well Patient left: in bed;with call bell/phone within reach Nurse Communication: Mobility status PT Visit Diagnosis: Other abnormalities of gait and mobility (R26.89);Unsteadiness on feet (R26.81)     Time: 8786-7672 PT Time Calculation (min) (ACUTE ONLY): 15 min  Charges:  $Gait Training: 8-22 mins                     Annina Piotrowski R. PTA Acute Rehabilitation Services Office: [place] 11/11/2021, 2:49 PM

## 2021-11-11 NOTE — Progress Notes (Signed)
     Daily Progress Note Intern Pager: [phone]  Patient name: Diamond Bowman Medical record number: [id number] Date of birth: [birth year] Age: 56 y.o. Gender: female  Primary Care Provider: Everardo Beals, NP Consultants: Cardiology, orthopedic surgery, electrophysiology Code Status: Full  Pt Overview and Major Events to Date:  6/13: Admitted 6/16: Spontaneously converted to normal sinus rhythm  6/17: Developed maculopapular rash  Assessment and Plan: Diamond Bowman is a 56 year old female presenting with CAP who met sepsis criteria on admission and also noted to be in a-fib with RVR now controlled in NSR. Pertinent PMH/PSH includes CKD stage 3, hypothyroidism, schizophrenia, intellectual disability, seizure disorder, MDD and tardive dyskinesia.  * Atrial fibrillation (HCC) Remains in NSR.  -Start Toprol XL 129m daily -Continue Eliquis 566mBID -Continue telemetry -Discontinue Verapamil -Follow-up with cardiology outpatient in 4 weeks  Hyperkalemia Potassium has been slowly increasing, remains borderline elevated at 5.2 this AM. Not on any potassium-sparing medications. Not stooled since 6/14 which may be contributing. -monitor BMP  Constipation Not stooled since 6/14. Apparently incorrectly charted BM on 6/17.  -Increase bowel regimen to MiraLAX twice daily and senna twice daily  Acute maculopapular rash Vitals remained stable however rash has significantly worsened. Potentially related to Verapamil but still unsure.  -Benadryl 50 mg every 6 hours -Prednisone 4028maily (day 2/3) -Triamcinolone cream 1% -Hydroxyzine 65m62mD -Pepcid 20 mg daily -continue to monitor  FEN/GI: Heart healthy PPx: Eliquis Dispo:Home with home health pending clinical improvement . Barriers include improvement of rash.   Subjective:  She continues to scratch, otherwise doing well with her coloring books.   Objective: Temp:  [98 F (36.7 C)-98.8 F (37.1 C)] 98.3 F (36.8 C) (06/20  0835) Pulse Rate:  [84-100] 95 (06/20 0835) Resp:  [16-20] 20 (06/20 0835) BP: (78-109)/(42-85) 92/52 (06/20 0843) SpO2:  [90 %-92 %] 92 % (06/19 2040) Weight:  [56.5 kg] 56.5 kg (06/20 0500) Physical Exam: General: Awake, alert, uncomfortable itching Cardiovascular: RRR, no murmurs auscultated Respiratory: CTA B, normal WOB Skin: Widespread maculopapular rash on chest, abdomen, back, upper and lower extremities; 1cm inclusion cyst on left axilla     Laboratory: Most recent CBC Lab Results  Component Value Date   WBC 11.9 (H) 11/10/2021   HGB 10.3 (L) 11/10/2021   HCT 30.6 (L) 11/10/2021   MCV 97.5 11/10/2021   PLT 103 (L) 11/10/2021   Most recent BMP    Latest Ref Rng & Units 11/11/2021    6:45 AM  BMP  Glucose 70 - 99 mg/dL 103   BUN 6 - 20 mg/dL 19   Creatinine 0.44 - 1.00 mg/dL 1.22   Sodium 135 - 145 mmol/L 132   Potassium 3.5 - 5.1 mmol/L 5.2   Chloride 98 - 111 mmol/L 94   CO2 22 - 32 mmol/L 30   Calcium 8.9 - 10.3 mg/dL 7.9    Imaging/Diagnostic Tests: No recent imaging results. DahbWells Guiles 11/11/2021, 12:10 PM  PGY-1, [facility] pager: [phone] pages welcome Secure chat group [facility] [place]

## 2021-11-11 NOTE — Plan of Care (Signed)

## 2021-11-11 NOTE — Progress Notes (Signed)
Progress Note  Patient Name: Diamond Bowman Date of Encounter: 11/11/2021  Attending physician: Kinnie Feil, MD Primary care provider: Everardo Beals, NP  Subjective: Diamond Bowman is a 56 y.o. female who was seen and examined at bedside. Sleeping in bed.  Easily arousable. Remains in NSR but blood pressure is soft. Rash more noticeable.  Objective: Vital Signs in the last 24 hours: Temp:  [98.3 F (36.8 C)-98.8 F (37.1 C)] 98.3 F (36.8 C) (06/20 0835) Pulse Rate:  [84-100] 95 (06/20 0835) Resp:  [16-20] 20 (06/20 0835) BP: (78-109)/(42-54) 92/52 (06/20 0843) SpO2:  [92 %] 92 % (06/19 2040) Weight:  [56.5 kg] 56.5 kg (06/20 0500)  Intake/Output:  Intake/Output Summary (Last 24 hours) at 11/11/2021 1309 Last data filed at 11/10/2021 2000 Gross per 24 hour  Intake --  Output 250 ml  Net -250 ml    Net IO Since Admission: -2,905.27 mL [11/11/21 1309]  Weights:  Filed Weights   11/08/21 0544 11/10/21 0419 11/11/21 0500  Weight: 56.3 kg 56.3 kg 56.5 kg    Telemetry: Personally reviewed. NSR.   Physical examination: PHYSICAL EXAM:    11/11/2021    8:43 AM 11/11/2021    8:35 AM 11/11/2021    5:00 AM  Vitals with BMI  Weight   124 lbs 9 oz  BMI   35.70  Systolic 92 78 87  Diastolic 52 43 42  Pulse  95 84    CONSTITUTIONAL: No acute distress, appears older than stated age, hemodynamically stable SKIN: Skin is warm and dry. No cyanosis. No pallor. No jaundice.  Rash more noticeable/ non pruritic noted (anterior/posterior torso, under breast tissue, abdomen, dorsum/ventral aspect of thighs.  HEAD: Normocephalic and atraumatic.  EYES: No scleral icterus MOUTH/THROAT: Moist oral membranes.  NECK: No JVD present. No thyromegaly noted. No carotid bruits  CHEST Normal respiratory effort. No intercostal retractions  LUNGS CTAB, kyphosis noted posteriorly CARDIOVASCULAR: Regular, S1S2, no murmurs rubs or gallops. ABDOMINAL: Soft, nontender, nondistended,  positive bowel sounds in all 4 quadrants, no apparent ascites.  EXTREMITIES: No pitting edema, warm to touch, 2+ bilateral DP and PT pulses HEMATOLOGIC: No significant bruising   Lab Results: Hematology Recent Labs  Lab 11/08/21 0628 11/09/21 0203 11/10/21 0316  WBC 6.2 9.0 11.9*  RBC 2.82* 2.79* 3.14*  HGB 9.1* 8.9* 10.3*  HCT 27.8* 27.7* 30.6*  MCV 98.6 99.3 97.5  MCH 32.3 31.9 32.8  MCHC 32.7 32.1 33.7  RDW 14.7 14.6 14.6  PLT 60* 73* 103*    Chemistry Recent Labs  Lab 11/09/21 0203 11/10/21 0316 11/11/21 0645  NA 136 140 132*  K 5.0 5.2* 5.2*  CL 105 101 94*  CO2 '26 30 30  '$ GLUCOSE 136* 97 103*  BUN '17 16 19  '$ CREATININE 1.04* 1.00 1.22*  CALCIUM 7.4* 7.8* 7.9*  GFRNONAA >60 >60 51*  ANIONGAP '5 9 8     '$ Cardiac Enzymes: Cardiac Panel (last 3 results) No results for input(s): "CKTOTAL", "CKMB", "TROPONINIHS", "RELINDX" in the last 72 hours.  BNP (last 3 results) No results for input(s): "BNP" in the last 8760 hours.  ProBNP (last 3 results) No results for input(s): "PROBNP" in the last 8760 hours.   DDimer No results for input(s): "DDIMER" in the last 168 hours.   Hemoglobin A1c:  Lab Results  Component Value Date   HGBA1C 5.2 11/05/2021   MPG 102.54 11/05/2021    TSH  Recent Labs    11/04/21 0826  TSH 1.312  Lipid Panel No results found for: "CHOL", "TRIG", "HDL", "CHOLHDL", "VLDL", "LDLCALC", "LDLDIRECT"  Imaging: No results found.  CARDIAC DATABASE: EKG: 11/04/2021: Sinus tachycardia, 164 bpm with PACs and PVCs.   11/04/2021 9:31 AM: Narrow complex tachycardia likely atrial tach with frequent PACs, 181 bpm, ST-T changes likely rate related but ischemia cannot be ruled out.   11/04/2021 12:56 PM: Sinus rhythm, 66 bpm, frequent PACs with atrial bigeminy, without underlying injury pattern.     Echocardiogram: 11/04/2021  1. Left ventricular ejection fraction, by estimation, is 55 to 60%. The  left ventricle has normal function. The  left ventricle has no regional  wall motion abnormalities. Left ventricular diastolic parameters were  normal.   2. Right ventricular systolic function is normal. The right ventricular  size is normal.   3. The mitral valve is grossly normal. No evidence of mitral valve  regurgitation. No evidence of mitral stenosis.   4. The aortic valve was not well visualized. Aortic valve regurgitation  is not visualized. No aortic stenosis is present.   5. Pulmonic valve regurgitation not well visualized.   Scheduled Meds:  acetaminophen  650 mg Oral TID   apixaban  5 mg Oral BID   diphenhydrAMINE  50 mg Oral Q6H   divalproex  1,500 mg Oral QHS   famotidine  20 mg Oral Daily   hydrocerin   Topical BID   hydrOXYzine  75 mg Oral TID   levothyroxine  75 mcg Oral Daily   metoprolol succinate  100 mg Oral Daily   mirtazapine  15 mg Oral QHS   OLANZapine zydis  15 mg Oral QHS   polyethylene glycol  17 g Oral BID   [START ON 11/12/2021] predniSONE  40 mg Oral Q breakfast   senna-docusate  1 tablet Oral BID   traZODone  300 mg Oral QHS   triamcinolone cream   Topical BID    Continuous Infusions:    PRN Meds:    IMPRESSION & RECOMMENDATIONS: Diamond Bowman is a 56 y.o. Caucasian female whose past medical history and cardiac risk factors include: Documented history of mental retardation, schizophrenia, tardive dyskinesia, history of falls and fractures, thrombocytopenia, seizures, poor historian.  Impression: Paroxysmal Afib. - currently SR Rash - still present, not worsening.  Severe sepsis likely due to pneumonia - improved  Acute kidney injury.  Hyperkalemia. Documented history of mental retardation. History of tardive  dyskinesia. History of fall/fractures. Thrombocytopenia. Seizures  Plan: Paroxysmal Afib.  Likely precipitated by her underlying severe sepsis due to PNA on presentation.  Did not require cardioversion she converted to normal sinus rhythm (11/07/2021).  She  developed a rash on the night of 11/07/2021 and based on EMR IV Amio was d/c.  Due to the rash oral amiodarone has been discontinued, Xarelto transitioned to Eliquis, and verapamil is currently being transitioned to Toprol-XL (hold BP<112mHG or HR <60bpm). . Based on her vitals patient has been hypotensive likely secondary to Benadryl, hydroxyzine, steroids.  We will do further work-up of hypotension to primary team. Once blood pressure allows recommend starting Toprol-XL for rate control strategy. As for continue anticoagulation for total of 4 weeks from 11/07/2021. Follow up as outpatient in 4 weeks.  IF after 4 weeeks she remains in sinus rhythm would recommend d/c Xarelto given her low CHA2DS2-Vasc score. Risk of bleeding would be greater than benefit due to baseline thrombocytopenia, hx of falls/fracture, tardive dyskinesia, and documented mental retardation.  IF she remains in Afib will need to make shared decision  w/ her next of kin.  CHA2DS2-VASc SCORE is 1 which correlates to 1.3 % risk of stroke per year (gender).  TSH within normal limits. Echo: Preserved LVEF, normal diastolic function, no significant valvular heart disease. Continue telemetry  RASH:  Management per primary team.  Recommended possible dermatology evaluation - will defer decision to primary team.  Currently on Benadryl, prednisone, triamcinolone cream, hydroxyzine, Pepcid. Patient is asymptomatic.  Has already completed a dose of Rocephin. IV amiodarone discontinued, oral amiodarone discontinued, Xarelto changed to Eliquis, and when blood pressure stabilizes recommend transitioning her from verapamil to Toprol-XL. Defer management to primary team.  Severe sepsis likely due to pneumonia: Management per primary team.  Thrombocytopenia: Present on arrival. See labs above.  Monitor for bleeding. Management to primary team.  Hyperkalemia: Management per primary team.  Acute kidney injury: Likely secondary to  diuresis and hypotension.  Management per primary team.  Recommendations conveyed to the resident physicians after morning rounds.  This note was created using a voice recognition software as a result there may be grammatical errors inadvertently enclosed that do not reflect the nature of this encounter. Every attempt is made to correct such errors.  Total time spent: 35 minutes.   Mechele [facility]  Pager: [phone] Office: [phone] 11/11/2021, 1:09 PM

## 2021-11-11 NOTE — Progress Notes (Signed)
FPTS Brief Progress Note  S:Patient sleeping comfortably so was not awakened.   O: BP (!) 99/46 (BP Location: Left Arm)   Pulse 87   Temp 98.2 F (36.8 C) (Oral)   Resp 17   Ht '4\' 6"'$  (1.372 m)   Wt 56.5 kg   SpO2 94%   BMI 30.03 kg/m   Sleeping with normal rise and fall of chest on RA  A/P: Patient continues to have soft BP but improved since discontinuing Benadryl.  -Continue plan per day team and cardiology  - Orders reviewed. Labs for AM ordered, which was adjusted as needed.   Rosezetta Schlatter, MD 11/12/2021, 12:56 AM PGY-1, [place] Night Resident  Please page [phone] with questions.

## 2021-11-11 NOTE — Progress Notes (Signed)
Subjective: Patient resting in bed.  Thermoplast splint placed on right hand.  She states the hand is not painful.   Objective: Vital signs in last 24 hours: Temp:  [98.3 F (36.8 C)-98.8 F (37.1 C)] 98.5 F (36.9 C) (06/20 1354) Pulse Rate:  [82-100] 82 (06/20 1354) Resp:  [16-20] 20 (06/20 1354) BP: (78-118)/(40-54) 118/40 (06/20 1354) SpO2:  [92 %-93 %] 93 % (06/20 1354) Weight:  [56.5 kg] 56.5 kg (06/20 0500)  Intake/Output from previous day: 06/19 0701 - 06/20 0700 In: -  Out: 250 [Urine:250] Intake/Output this shift: No intake/output data recorded.  Recent Labs    11/09/21 0203 11/10/21 0316  HGB 8.9* 10.3*   Recent Labs    11/09/21 0203 11/10/21 0316  WBC 9.0 11.9*  RBC 2.79* 3.14*  HCT 27.7* 30.6*  PLT 73* 103*   Recent Labs    11/10/21 0316 11/11/21 0645  NA 140 132*  K 5.2* 5.2*  CL 101 94*  CO2 30 30  BUN 16 19  CREATININE 1.00 1.22*  GLUCOSE 97 103*  CALCIUM 7.8* 7.9*   No results for input(s): "LABPT", "INR" in the last 72 hours.  Intact capillary refill in the fingertip.  Minimal tenderness to palpation base of index finger proximal phalanx.    Assessment/Plan: Right index finger proximal phalanx nondisplaced fracture.  Recommend continued use of the splint.  We will recheck x-rays.  Follow-up in office after discharge.    Leanora Cover 11/11/2021, 4:27 PM

## 2021-11-12 ENCOUNTER — Inpatient Hospital Stay (HOSPITAL_COMMUNITY): Payer: Medicaid Other

## 2021-11-12 DIAGNOSIS — K5900 Constipation, unspecified: Secondary | ICD-10-CM | POA: Diagnosis not present

## 2021-11-12 DIAGNOSIS — I4891 Unspecified atrial fibrillation: Secondary | ICD-10-CM | POA: Diagnosis not present

## 2021-11-12 DIAGNOSIS — R21 Rash and other nonspecific skin eruption: Secondary | ICD-10-CM | POA: Diagnosis not present

## 2021-11-12 LAB — COMPREHENSIVE METABOLIC PANEL
ALT: 17 U/L (ref 0–44)
AST: 17 U/L (ref 15–41)
Albumin: 1.7 g/dL — ABNORMAL LOW (ref 3.5–5.0)
Alkaline Phosphatase: 39 U/L (ref 38–126)
Anion gap: 7 (ref 5–15)
BUN: 25 mg/dL — ABNORMAL HIGH (ref 6–20)
CO2: 27 mmol/L (ref 22–32)
Calcium: 7.6 mg/dL — ABNORMAL LOW (ref 8.9–10.3)
Chloride: 99 mmol/L (ref 98–111)
Creatinine, Ser: 1.32 mg/dL — ABNORMAL HIGH (ref 0.44–1.00)
GFR, Estimated: 47 mL/min — ABNORMAL LOW (ref >60)
Glucose, Bld: 105 mg/dL — ABNORMAL HIGH (ref 70–99)
Potassium: 4.9 mmol/L (ref 3.5–5.1)
Sodium: 133 mmol/L — ABNORMAL LOW (ref 135–145)
Total Bilirubin: 0.3 mg/dL (ref 0.3–1.2)
Total Protein: 4.9 g/dL — ABNORMAL LOW (ref 6.5–8.1)

## 2021-11-12 LAB — CBC WITH DIFFERENTIAL/PLATELET
Abs Immature Granulocytes: 0.97 10*3/uL — ABNORMAL HIGH (ref 0.00–0.07)
Basophils Absolute: 0.1 10*3/uL (ref 0.0–0.1)
Basophils Relative: 1 %
Eosinophils Absolute: 0.7 10*3/uL — ABNORMAL HIGH (ref 0.0–0.5)
Eosinophils Relative: 3 %
HCT: 26.8 % — ABNORMAL LOW (ref 36.0–46.0)
Hemoglobin: 9.1 g/dL — ABNORMAL LOW (ref 12.0–15.0)
Immature Granulocytes: 4 %
Lymphocytes Relative: 8 %
Lymphs Abs: 1.7 10*3/uL (ref 0.7–4.0)
MCH: 32.7 pg (ref 26.0–34.0)
MCHC: 34 g/dL (ref 30.0–36.0)
MCV: 96.4 fL (ref 80.0–100.0)
Monocytes Absolute: 1.1 10*3/uL — ABNORMAL HIGH (ref 0.1–1.0)
Monocytes Relative: 5 %
Neutro Abs: 17.4 10*3/uL — ABNORMAL HIGH (ref 1.7–7.7)
Neutrophils Relative %: 79 %
Platelets: 127 10*3/uL — ABNORMAL LOW (ref 150–400)
RBC: 2.78 MIL/uL — ABNORMAL LOW (ref 3.87–5.11)
RDW: 14.1 % (ref 11.5–15.5)
WBC: 21.9 10*3/uL — ABNORMAL HIGH (ref 4.0–10.5)
nRBC: 0.2 % (ref 0.0–0.2)

## 2021-11-12 LAB — BASIC METABOLIC PANEL
Anion gap: 8 (ref 5–15)
BUN: 26 mg/dL — ABNORMAL HIGH (ref 6–20)
CO2: 27 mmol/L (ref 22–32)
Calcium: 7.7 mg/dL — ABNORMAL LOW (ref 8.9–10.3)
Chloride: 97 mmol/L — ABNORMAL LOW (ref 98–111)
Creatinine, Ser: 1.26 mg/dL — ABNORMAL HIGH (ref 0.44–1.00)
GFR, Estimated: 49 mL/min — ABNORMAL LOW (ref >60)
Glucose, Bld: 82 mg/dL (ref 70–99)
Potassium: 5.6 mmol/L — ABNORMAL HIGH (ref 3.5–5.1)
Sodium: 132 mmol/L — ABNORMAL LOW (ref 135–145)

## 2021-11-12 MED ORDER — LACTULOSE 10 GM/15ML PO SOLN
20.0000 g | Freq: Every day | ORAL | Status: DC
  Administered 2021-11-12 – 2021-11-14 (×3): 20 g via ORAL
  Filled 2021-11-12 (×3): qty 30

## 2021-11-12 MED ORDER — METOPROLOL SUCCINATE ER 50 MG PO TB24
50.0000 mg | ORAL_TABLET | Freq: Every day | ORAL | Status: DC
  Administered 2021-11-12 – 2021-11-14 (×3): 50 mg via ORAL
  Filled 2021-11-12 (×3): qty 1

## 2021-11-12 MED ORDER — DIPHENHYDRAMINE HCL 12.5 MG/5ML PO ELIX
12.5000 mg | ORAL_SOLUTION | Freq: Every day | ORAL | Status: DC
  Administered 2021-11-12 – 2021-11-13 (×2): 12.5 mg via ORAL
  Filled 2021-11-12 (×3): qty 5

## 2021-11-12 MED ORDER — HYDROXYZINE HCL 25 MG PO TABS
50.0000 mg | ORAL_TABLET | Freq: Three times a day (TID) | ORAL | Status: DC
  Administered 2021-11-12 (×2): 50 mg via ORAL
  Filled 2021-11-12 (×2): qty 2

## 2021-11-12 MED ORDER — SODIUM ZIRCONIUM CYCLOSILICATE 10 G PO PACK
10.0000 g | PACK | Freq: Once | ORAL | Status: AC
  Administered 2021-11-12: 10 g via ORAL
  Filled 2021-11-12: qty 1

## 2021-11-12 NOTE — Assessment & Plan Note (Addendum)
Improving throughout.  -Prednisone '40mg'$  daily, last day -Triamcinolone cream 1% -Hydroxyzine '50mg'$  BID -Benadryl 12.'5mg'$  daily -Pepcid 20 mg daily -continue to monitor

## 2021-11-12 NOTE — Progress Notes (Signed)
     Daily Progress Note Intern Pager: [phone]  Patient name: Diamond Bowman Medical record number: [id number] Date of birth: 1965/08/30 Age: 56 y.o. Gender: female  Primary Care Provider: Everardo Beals, NP Consultants: Cardiology, orthopedic surgery, electrophysiology Code Status: Full  Pt Overview and Major Events to Date:  6/13: Admitted 6/16: Spontaneously converted to normal sinus rhythm  6/17: Developed maculopapular rash  Assessment and Plan: Diamond Bowman is a 56 year old female presenting with CAP who met sepsis criteria on admission and also noted to be in a-fib with RVR now controlled in NSR but developed extensive maculopapular rash suspected to be related to drug reaction. Pertinent PMH/PSH includes CKD stage 3, hypothyroidism, schizophrenia, intellectual disability, seizure disorder, MDD and tardive dyskinesia.  * Atrial fibrillation (HCC) Remains in NSR.   Cardiology following peripherally. -Toprol XL 50mg  daily -Continue Eliquis 5mg  BID -Continue telemetry -Follow-up with cardiology outpatient in 4 weeks  Hyperkalemia Potassium elevated to 5.6 this AM.  Received Lokelma 10g. -CMP @1200  -A.m. BMP  Constipation Not stooled since 6/14. Apparently incorrectly charted BM on 6/17.  -Increase bowel regimen to MiraLAX twice daily and senna twice daily -Lactulose 20g daily  Acute maculopapular rash Slightly improved on extremities however worsened on back.  Should improve today if related to verapamil, may persist for at least another 24 hours if related to amiodarone. Benadryl was discontinued due to lower BPs. -Prednisone 40mg  daily, continue for 1 more day -Triamcinolone cream 1% -Change to Hydroxyzine 50mg  TID -Start Benadryl 12.5mg  daily -Pepcid 20 mg daily -continue to monitor  FEN/GI: Heart healthy PPx: Eliquis 5 mg twice daily Dispo:Home with home health pending clinical improvement . Barriers include rash.   Subjective:  States she is still  scratching.  Not able to answer question of improved versus worsened.  Objective: Temp:  [98.1 F (36.7 C)-99 F (37.2 C)] 98.1 F (36.7 C) (06/21 0606) Pulse Rate:  [82-108] 108 (06/21 0955) Resp:  [17-21] 20 (06/21 0740) BP: (99-119)/(40-96) 119/96 (06/21 0955) SpO2:  [92 %-95 %] 94 % (06/21 0730) Weight:  [60.1 kg] 60.1 kg (06/21 0606) Physical Exam: General: Awake, alert, uncomfortably itching Cardiovascular: RRR, no murmurs auscultated Respiratory: CTA B, normal WOB Skin: Widespread maculopapular rash on chest, abdomen, back, upper and lower extremities, faded/improved maculopapular rash on upper extremities  Laboratory: Most recent CBC Lab Results  Component Value Date   WBC 11.9 (H) 11/10/2021   HGB 10.3 (L) 11/10/2021   HCT 30.6 (L) 11/10/2021   MCV 97.5 11/10/2021   PLT 103 (L) 11/10/2021   Most recent BMP    Latest Ref Rng & Units 11/12/2021    1:55 AM  BMP  Glucose 70 - 99 mg/dL 82   BUN 6 - 20 mg/dL 26   Creatinine 0.44 - 1.00 mg/dL 1.26   Sodium 135 - 145 mmol/L 132   Potassium 3.5 - 5.1 mmol/L 5.6   Chloride 98 - 111 mmol/L 97   CO2 22 - 32 mmol/L 27   Calcium 8.9 - 10.3 mg/dL 7.7    Imaging/Diagnostic Tests: No recent imaging results. Diamond Guiles, DO 11/12/2021, 12:08 PM  PGY-1, [place] Intern pager: [phone], text pages welcome Secure chat group [facility]

## 2021-11-12 NOTE — Plan of Care (Signed)

## 2021-11-12 NOTE — Progress Notes (Signed)
  Patient noted to have K 5.6, called nurse and patient is sleeping. Denies any chest pain and nurse has not noticed any changes. Lokelma ordered and plan for recheck of K later in the day. Will hold off on EKG as patient asymptomatic. Discussed plan with nurse who is aware.     Donney Dice, DO 11/12/2021, 4:56 AM PGY-2, [place] Medicine Service pager [phone]

## 2021-11-12 NOTE — Assessment & Plan Note (Deleted)
Not stooled since 6/14. Apparently incorrectly charted BM on 6/17.  -Increase bowel regimen to MiraLAX twice daily and senna twice daily -Lactulose 20g daily

## 2021-11-12 NOTE — Progress Notes (Signed)
Progress Note  Patient Name: Diamond Bowman Date of Encounter: 11/12/2021  Attending physician: Kinnie Feil, MD Primary care provider: Everardo Beals, NP  Subjective: Diamond Bowman is a 56 y.o. female who was seen and examined at bedside. Laying in bed  No acute distress.  Denies CP or shortness of breath Remains in NSR but blood pressure is soft. Rash still present.   Objective: Vital Signs in the last 24 hours: Temp:  [98.1 F (36.7 C)-99 F (37.2 C)] 98.1 F (36.7 C) (06/21 0606) Pulse Rate:  [82-98] 98 (06/21 0730) Resp:  [17-21] 20 (06/21 0740) BP: (99-118)/(40-60) 101/60 (06/21 0625) SpO2:  [92 %-95 %] 94 % (06/21 0730) Weight:  [60.1 kg] 60.1 kg (06/21 0606)  Intake/Output:  Intake/Output Summary (Last 24 hours) at 11/12/2021 0911 Last data filed at 11/12/2021 0730 Gross per 24 hour  Intake 241.39 ml  Output 2100 ml  Net -1858.61 ml    Net IO Since Admission: -4,763.88 mL [11/12/21 0911]  Weights:  Filed Weights   11/10/21 0419 11/11/21 0500 11/12/21 0606  Weight: 56.3 kg 56.5 kg 60.1 kg    Telemetry: Personally reviewed. NSR.   Physical examination: PHYSICAL EXAM:    11/12/2021    7:30 AM 11/12/2021    6:25 AM 11/12/2021    6:06 AM  Vitals with BMI  Weight   132 lbs 8 oz  BMI   83.66  Systolic  294 765  Diastolic  60 47  Pulse 98  93    CONSTITUTIONAL: No acute distress, appears older than stated age, hemodynamically stable SKIN: Skin is warm and dry. No cyanosis. No pallor. No jaundice.  Rash present non pruritic noted (anterior/posterior torso, under breast tissue, abdomen, dorsum/ventral aspect of thighs.  HEAD: Normocephalic and atraumatic.  EYES: No scleral icterus MOUTH/THROAT: Moist oral membranes.  NECK: No JVD present. No thyromegaly noted. No carotid bruits  CHEST Normal respiratory effort. No intercostal retractions  LUNGS CTAB, kyphosis noted posteriorly CARDIOVASCULAR: Regular, S1S2, no murmurs rubs or  gallops. ABDOMINAL: Soft, nontender, nondistended, positive bowel sounds in all 4 quadrants, no apparent ascites.  EXTREMITIES: No pitting edema, warm to touch, 2+ bilateral DP and PT pulses HEMATOLOGIC: No significant bruising   Lab Results: Hematology Recent Labs  Lab 11/08/21 0628 11/09/21 0203 11/10/21 0316  WBC 6.2 9.0 11.9*  RBC 2.82* 2.79* 3.14*  HGB 9.1* 8.9* 10.3*  HCT 27.8* 27.7* 30.6*  MCV 98.6 99.3 97.5  MCH 32.3 31.9 32.8  MCHC 32.7 32.1 33.7  RDW 14.7 14.6 14.6  PLT 60* 73* 103*    Chemistry Recent Labs  Lab 11/10/21 0316 11/11/21 0645 11/12/21 0155  NA 140 132* 132*  K 5.2* 5.2* 5.6*  CL 101 94* 97*  CO2 '30 30 27  '$ GLUCOSE 97 103* 82  BUN 16 19 26*  CREATININE 1.00 1.22* 1.26*  CALCIUM 7.8* 7.9* 7.7*  GFRNONAA >60 51* 49*  ANIONGAP '9 8 8     '$ Cardiac Enzymes: Cardiac Panel (last 3 results) No results for input(s): "CKTOTAL", "CKMB", "TROPONINIHS", "RELINDX" in the last 72 hours.  BNP (last 3 results) No results for input(s): "BNP" in the last 8760 hours.  ProBNP (last 3 results) No results for input(s): "PROBNP" in the last 8760 hours.   DDimer No results for input(s): "DDIMER" in the last 168 hours.   Hemoglobin A1c:  Lab Results  Component Value Date   HGBA1C 5.2 11/05/2021   MPG 102.54 11/05/2021    TSH  Recent Labs  11/04/21 0826  TSH 1.312    Lipid Panel No results found for: "CHOL", "TRIG", "HDL", "CHOLHDL", "VLDL", "LDLCALC", "LDLDIRECT"  Imaging: DG Finger Index Right  Result Date: 11/12/2021 CLINICAL DATA:  Proximal phalanx fracture of finger. EXAM: RIGHT INDEX FINGER 2+V COMPARISON:  Right hand radiographs 11/04/2021 FINDINGS: Redemonstration of oblique linear lucency within the medial base of the proximal phalanx of the index finger, an intra-articular fracture with 1 mm distal cortical step-off but otherwise no significant displacement. Minimal healing resorption of the fracture edges but otherwise no significant  healing at this time. No dislocation. IMPRESSION: Unchanged near anatomic alignment of base of the proximal phalanx of the index finger acute to subacute fracture with intra-articular extension. Electronically Signed   By: Yvonne Kendall M.D.   On: 11/12/2021 08:11    CARDIAC DATABASE: EKG: 11/04/2021: Sinus tachycardia, 164 bpm with PACs and PVCs.   11/04/2021 9:31 AM: Narrow complex tachycardia likely atrial tach with frequent PACs, 181 bpm, ST-T changes likely rate related but ischemia cannot be ruled out.   11/04/2021 12:56 PM: Sinus rhythm, 66 bpm, frequent PACs with atrial bigeminy, without underlying injury pattern.     Echocardiogram: 11/04/2021  1. Left ventricular ejection fraction, by estimation, is 55 to 60%. The  left ventricle has normal function. The left ventricle has no regional  wall motion abnormalities. Left ventricular diastolic parameters were  normal.   2. Right ventricular systolic function is normal. The right ventricular  size is normal.   3. The mitral valve is grossly normal. No evidence of mitral valve  regurgitation. No evidence of mitral stenosis.   4. The aortic valve was not well visualized. Aortic valve regurgitation  is not visualized. No aortic stenosis is present.   5. Pulmonic valve regurgitation not well visualized.   Scheduled Meds:  acetaminophen  650 mg Oral TID   apixaban  5 mg Oral BID   divalproex  1,500 mg Oral QHS   famotidine  20 mg Oral Daily   hydrocerin   Topical BID   hydrOXYzine  75 mg Oral TID   levothyroxine  75 mcg Oral Daily   metoprolol succinate  50 mg Oral Daily   mirtazapine  15 mg Oral QHS   OLANZapine zydis  15 mg Oral QHS   polyethylene glycol  17 g Oral BID   predniSONE  40 mg Oral Q breakfast   senna-docusate  1 tablet Oral BID   traZODone  300 mg Oral QHS   triamcinolone cream   Topical BID    Continuous Infusions:  PRN Meds:    IMPRESSION & RECOMMENDATIONS: Diamond Bowman is a 56 y.o. Caucasian female  whose past medical history and cardiac risk factors include: Documented history of mental retardation, schizophrenia, tardive dyskinesia, history of falls and fractures, thrombocytopenia, seizures, poor historian.  Impression: Paroxysmal Afib. - currently SR Rash - still present, not worsening.  Severe sepsis likely due to pneumonia - improved  Acute kidney injury.  Hyperkalemia. Documented history of mental retardation. History of tardive  dyskinesia. History of fall/fractures. Thrombocytopenia. Seizures  Plan: Paroxysmal Afib.  Likely precipitated by her underlying severe sepsis due to PNA on presentation.  Did not require cardioversion & converted to normal sinus rhythm (11/07/2021).  She developed a rash on the night of 11/07/2021 and based on EMR IV Amio was d/c.  Due to the rash oral amiodarone has been discontinued, Xarelto transitioned to Eliquis, and verapamil is  transitioned to Toprol-XL (hold BP<157mHG or HR <60bpm). Not received  Toprol XL due to soft BP.  Will reduce Toprol XL to '50mg'$  po qday - titrate up as BP allows.  Would recommend [place] for total of 4 weeks from 11/07/2021. Follow up as outpatient in 4 weeks.  IF after 4 weeeks she remains in sinus rhythm would recommend d/c OAC given her low CHA2DS2-Vasc score. In long-term, risk of bleeding would be greater than benefit due to baseline thrombocytopenia, hx of falls/fracture, tardive dyskinesia, and documented mental retardation.  IF she remains in Afib will need to make shared decision w/ her next of kin.  CHA2DS2-VASc SCORE is 1 which correlates to 1.3 % risk of stroke per year (gender).  TSH within normal limits. Echo: Preserved LVEF, normal diastolic function, no significant valvular heart disease. Continue telemetry - remains in SR over last 24 hours.   RASH:  Management per primary team.  Recommended possible dermatology evaluation - will defer decision to primary team.  Currently on Benadryl, prednisone,  triamcinolone cream, hydroxyzine, Pepcid. Patient is asymptomatic.  Has already completed a dose of Rocephin. IV amiodarone discontinued, oral amiodarone discontinued, Xarelto changed to Eliquis, and changed verapamil to Toprol-XL. Defer management to primary team.  Severe sepsis likely due to pneumonia: Management per primary team.  Thrombocytopenia: Present on arrival. See labs above.  Monitor for bleeding. Management to primary team.  Hyperkalemia: Management per primary team.  Recommendations conveyed to the resident physicians after morning rounds.  Will follow peripherally.   This note was created using a voice recognition software as a result there may be grammatical errors inadvertently enclosed that do not reflect the nature of this encounter. Every attempt is made to correct such errors.  Total time spent: 25 minutes.   [facility]  Pager: [phone] Office: [phone] 11/12/2021, 9:11 AM

## 2021-11-12 NOTE — Assessment & Plan Note (Addendum)
Remains in NSR.   Cardiology following peripherally. -Toprol XL '50mg'$  daily -Continue Eliquis '5mg'$  BID -Continue telemetry -Follow-up with cardiology outpatient in 4 weeks

## 2021-11-12 NOTE — Progress Notes (Signed)
Physical Therapy Treatment Patient Details Name: Diamond Bowman MRN: [id number] DOB: 1965/08/12 Today's Date: 11/12/2021   History of Present Illness Pt is a 56 y.o. female admitted 11/04/21 with cough, SOB, AMS; of note, pt also with recent injury to R index finger sustaining R proximal phalanx fx. Workup for CAP, narrow complex tachycardia. PMH includes bipolar, schizophrenia, mental retardation, tardive dyskinesia, MDD, CKD 3, hypothyroidism.    PT Comments    Pt supine in bed on arrival this session.  Pt required max cues for encouragement to participate in PT session.  She is very impulsive and eractic with her movements which hinders her safety, however no LOB noted this session.  Pt sitting in recliner post session with her coloring book.      Recommendations for follow up therapy are one component of a multi-disciplinary discharge planning process, led by the attending physician.  Recommendations may be updated based on patient status, additional functional criteria and insurance authorization.  Follow Up Recommendations  Home health PT     Assistance Recommended at Discharge Frequent or constant Supervision/Assistance  Patient can return home with the following A little help with walking and/or transfers;A lot of help with bathing/dressing/bathroom;Assistance with cooking/housework;Assistance with feeding;Direct supervision/assist for medications management;Direct supervision/assist for financial management;Assist for transportation;Help with stairs or ramp for entrance   Equipment Recommendations  None recommended by PT    Recommendations for Other Services       Precautions / Restrictions Precautions Precautions: Fall Precaution Comments: watch HR (extreme tachy) Required Braces or Orthoses: Splint/Cast Splint/Cast: R hand via OT-refused to wear Restrictions Weight Bearing Restrictions: Yes Other Position/Activity Restrictions: per ortho MD - RUE WB through elbow or ulnar  side of hand     Mobility  Bed Mobility Overal bed mobility: Modified Independent Bed Mobility: Supine to Sit, Sit to Supine                Transfers Overall transfer level: Needs assistance Equipment used: None Transfers: Sit to/from Stand Sit to Stand: Min guard           General transfer comment: min gaurd for safety    Ambulation/Gait Ambulation/Gait assistance: Min guard Gait Distance (Feet): 10 Feet (+ 30 ft + 10 ft.  Seated restbreak between each trial.  Pt is very impulsive with mobility and presents with poor safety using RW.) Assistive device: Rolling walker (2 wheels) Gait Pattern/deviations: Step-through pattern, Decreased stride length, Trunk flexed Gait velocity: too quick for pt safety     General Gait Details: Cues for sequencing and RW safety.  Cues to step closer to device.  Cues to slow pace.  Pt is very eratic and does not follow commands for safety well.   Stairs             Wheelchair Mobility    Modified Rankin (Stroke Patients Only)       Balance Overall balance assessment: Needs assistance   Sitting balance-Leahy Scale: Good     Standing balance support: Single extremity supported, Bilateral upper extremity supported, During functional activity Standing balance-Leahy Scale: Fair Standing balance comment: able to walk without UE support but tends to reach for support and RW for hall gait                            Cognition Arousal/Alertness: Awake/alert Behavior During Therapy: [facility] for tasks assessed/performed Overall Cognitive Status: History of cognitive impairments - at baseline  General Comments: most likely at baseline; follow commands and engaging in simple conversation. wanting to spend more time in the bed; Max encouragement to get OOB        Exercises      General Comments        Pertinent Vitals/Pain Pain Assessment Pain Assessment: No/denies  pain Faces Pain Scale: No hurt    Home Living                          Prior Function            PT Goals (current goals can now be found in the care plan section) Acute Rehab PT Goals Patient Stated Goal: color in new coloring book Potential to Achieve Goals: Good Progress towards PT goals: Progressing toward goals    Frequency    Min 3X/week      PT Plan Current plan remains appropriate    Co-evaluation              AM-PAC PT "6 Clicks" Mobility   Outcome Measure  Help needed turning from your back to your side while in a flat bed without using bedrails?: A Little Help needed moving from lying on your back to sitting on the side of a flat bed without using bedrails?: A Little Help needed moving to and from a bed to a chair (including a wheelchair)?: A Little Help needed standing up from a chair using your arms (e.g., wheelchair or bedside chair)?: A Little Help needed to walk in hospital room?: A Little Help needed climbing 3-5 steps with a railing? : A Lot 6 Click Score: 17    End of Session Equipment Utilized During Treatment: Gait belt Activity Tolerance: Patient tolerated treatment well Patient left: in bed;with call bell/phone within reach Nurse Communication: Mobility status PT Visit Diagnosis: Other abnormalities of gait and mobility (R26.89);Unsteadiness on feet (R26.81)     Time: 1212-1232 PT Time Calculation (min) (ACUTE ONLY): 20 min  Charges:  $Therapeutic Activity: 8-22 mins                     Erasmo Leventhal , PTA Acute Rehabilitation Services  Office [phone]    Cristela Blue 11/12/2021, 12:41 PM

## 2021-11-12 NOTE — Progress Notes (Signed)
Dr Andrena Mews to bedside this AM 0630.

## 2021-11-12 NOTE — Assessment & Plan Note (Addendum)
Potassium elevated to 5.3 this AM.  Received Lokelma 10g. Overall upward trend in potassium since admission. No specific cause identified at this time.  -AM BMP

## 2021-11-13 DIAGNOSIS — R21 Rash and other nonspecific skin eruption: Secondary | ICD-10-CM | POA: Diagnosis not present

## 2021-11-13 DIAGNOSIS — I4891 Unspecified atrial fibrillation: Secondary | ICD-10-CM | POA: Diagnosis not present

## 2021-11-13 DIAGNOSIS — E875 Hyperkalemia: Secondary | ICD-10-CM | POA: Diagnosis not present

## 2021-11-13 LAB — CBC
HCT: 30.3 % — ABNORMAL LOW (ref 36.0–46.0)
Hemoglobin: 10.2 g/dL — ABNORMAL LOW (ref 12.0–15.0)
MCH: 32.5 pg (ref 26.0–34.0)
MCHC: 33.7 g/dL (ref 30.0–36.0)
MCV: 96.5 fL (ref 80.0–100.0)
Platelets: 143 10*3/uL — ABNORMAL LOW (ref 150–400)
RBC: 3.14 MIL/uL — ABNORMAL LOW (ref 3.87–5.11)
RDW: 14.3 % (ref 11.5–15.5)
WBC: 21.2 10*3/uL — ABNORMAL HIGH (ref 4.0–10.5)
nRBC: 0.2 % (ref 0.0–0.2)

## 2021-11-13 LAB — BASIC METABOLIC PANEL
Anion gap: 8 (ref 5–15)
BUN: 22 mg/dL — ABNORMAL HIGH (ref 6–20)
CO2: 30 mmol/L (ref 22–32)
Calcium: 7.9 mg/dL — ABNORMAL LOW (ref 8.9–10.3)
Chloride: 98 mmol/L (ref 98–111)
Creatinine, Ser: 1.04 mg/dL — ABNORMAL HIGH (ref 0.44–1.00)
GFR, Estimated: >60 mL/min (ref >60)
Glucose, Bld: 93 mg/dL (ref 70–99)
Potassium: 5.3 mmol/L — ABNORMAL HIGH (ref 3.5–5.1)
Sodium: 136 mmol/L (ref 135–145)

## 2021-11-13 MED ORDER — SENNOSIDES-DOCUSATE SODIUM 8.6-50 MG PO TABS
1.0000 | ORAL_TABLET | Freq: Every day | ORAL | Status: DC
  Administered 2021-11-13: 1 via ORAL
  Filled 2021-11-13: qty 1

## 2021-11-13 MED ORDER — POLYETHYLENE GLYCOL 3350 17 G PO PACK
17.0000 g | PACK | Freq: Every day | ORAL | Status: DC
  Administered 2021-11-13: 17 g via ORAL
  Filled 2021-11-13: qty 1

## 2021-11-13 MED ORDER — HYDROXYZINE HCL 25 MG PO TABS
50.0000 mg | ORAL_TABLET | Freq: Two times a day (BID) | ORAL | Status: DC
Start: 2021-11-13 — End: 2021-11-14
  Administered 2021-11-13 – 2021-11-14 (×3): 50 mg via ORAL
  Filled 2021-11-13 (×3): qty 2

## 2021-11-13 MED ORDER — SODIUM ZIRCONIUM CYCLOSILICATE 10 G PO PACK
10.0000 g | PACK | Freq: Once | ORAL | Status: DC
Start: 2021-11-13 — End: 2021-11-13
  Filled 2021-11-13: qty 1

## 2021-11-13 MED ORDER — SODIUM ZIRCONIUM CYCLOSILICATE 5 G PO PACK
5.0000 g | PACK | Freq: Once | ORAL | Status: AC
Start: 2021-11-13 — End: 2021-11-13
  Administered 2021-11-13: 5 g via ORAL
  Filled 2021-11-13: qty 1

## 2021-11-13 NOTE — Progress Notes (Signed)
  S:Went to bedside to check on patient, she was asleep so I did not wake her. Discussed with nurse who denies any concerns. She says that patient's rash appears improved. I informed her to notify our team if any concerns arise, she agreed.   O: BP 135/62 (BP Location: Left Arm)   Pulse 85   Temp 98 F (36.7 C) (Oral)   Resp 18   Ht '4\' 6"'$  (1.372 m)   Wt 60.1 kg   SpO2 94%   BMI 31.95 kg/m   General: Patient sleeping comfortably, in no acute distress. Resp: normal work of breathing noted  A/P: Vitals stable and orders reviewed. Continue plan per day team.   Donney Dice, DO 11/13/2021, 1:08 AM PGY-2, [place] Medicine Service pager [phone]

## 2021-11-13 NOTE — Progress Notes (Signed)
Occupational Therapy Treatment Patient Details Name: Diamond Bowman MRN: [id number] DOB: 1966/05/25 Today's Date: 11/13/2021   History of present illness Pt is a 56 y.o. female admitted 11/04/21 with cough, SOB, AMS; of note, pt also with recent injury to R index finger sustaining R proximal phalanx fx. Workup for CAP, narrow complex tachycardia. PMH includes bipolar, schizophrenia, mental retardation, tardive dyskinesia, MDD, CKD 3, hypothyroidism.   OT comments  Pt not wearing R hand splint. Splint donned without issues and pt eventually agreed to ambulate into the hall to get her coloring book. Recommend continued ambulation with staff. Pt to wear R hand splint for R index finger fx. Will continue to follow.   Recommendations for follow up therapy are one component of a multi-disciplinary discharge planning process, led by the attending physician.  Recommendations may be updated based on patient status, additional functional criteria and insurance authorization.    Follow Up Recommendations  Home health OT    Assistance Recommended at Discharge Frequent or constant Supervision/Assistance  Patient can return home with the following  A little help with walking and/or transfers;A lot of help with bathing/dressing/bathroom;Assistance with cooking/housework;Assistance with feeding;Direct supervision/assist for medications management;Assist for transportation;Help with stairs or ramp for entrance   Equipment Recommendations  Tub/shower seat    Recommendations for Other Services      Precautions / Restrictions Precautions Precautions: Fall Precaution Comments: watch HR (extreme tachy) Required Braces or Orthoses: Splint/Cast Splint/Cast: R hand via OT-refused to wear Restrictions Weight Bearing Restrictions: No Other Position/Activity Restrictions: per ortho MD - RUE WB through elbow or ulnar side of hand       Mobility Bed Mobility Overal bed mobility: Modified Independent                   Transfers Overall transfer level: Needs assistance Equipment used: Rolling walker (2 wheels)   Sit to Stand: Supervision           General transfer comment: pt askingto use RW     Balance             Standing balance-Leahy Scale: Fair                             ADL either performed or assessed with clinical judgement   ADL                                       Functional mobility during ADLs: Min guard;Rolling walker (2 wheels) General ADL Comments: Pt able to assist with donning sweater; assists with ADLs that she wants to perform; using red tubing to hold spoon and feed herself ice cream    Extremity/Trunk Assessment Upper Extremity Assessment Upper Extremity Assessment: RUE deficits/detail RUE Deficits / Details: not wearing splint; does not appear to be in pain; splint repositioned; nsg notified to encourage pt to wear splint as tolerated            Vision       Perception     Praxis      Cognition Arousal/Alertness: Awake/alert Behavior During Therapy: Impulsive, Restless Overall Cognitive Status: History of cognitive impairments - at baseline  Exercises      Shoulder Instructions       General Comments coloring in books    Pertinent Vitals/ Pain       Pain Assessment Pain Assessment: Faces Faces Pain Scale: No hurt  Home Living                                          Prior Functioning/Environment              Frequency  Min 3X/week        Progress Toward Goals  OT Goals(current goals can now be found in the care plan section)  Progress towards OT goals: Progressing toward goals  Acute Rehab OT Goals Patient Stated Goal: to get a peanut butter adn jelly sandwiche OT Goal Formulation: Patient unable to participate in goal setting Time For Goal Achievement: 11/19/21 Potential to Achieve Goals:  Good ADL Goals Pt Will Perform Eating: with set-up;sitting Pt Will Transfer to Toilet: with min guard assist;ambulating;regular height toilet Additional ADL Goal #1: Caregiver will demonstrate skin check and donning/doffing of splint independently  Plan Discharge plan remains appropriate    Co-evaluation                 AM-PAC OT "6 Clicks" Daily Activity     Outcome Measure   Help from another person eating meals?: A Little Help from another person taking care of personal grooming?: A Little Help from another person toileting, which includes using toliet, bedpan, or urinal?: Total Help from another person bathing (including washing, rinsing, drying)?: A Lot Help from another person to put on and taking off regular upper body clothing?: A Little Help from another person to put on and taking off regular lower body clothing?: A Lot 6 Click Score: 14    End of Session    OT Visit Diagnosis: Unsteadiness on feet (R26.81);Other abnormalities of gait and mobility (R26.89);Muscle weakness (generalized) (M62.81);Other symptoms and signs involving cognitive function   Activity Tolerance Patient tolerated treatment well   Patient Left in bed;with call bell/phone within reach;with bed alarm set   Nurse Communication Mobility status;Other (comment)        Time: 7824-2353 OT Time Calculation (min): 20 min  Charges: OT General Charges $OT Visit: 1 Visit OT Treatments $Self Care/Home Management : 8-22 mins  Maurie Boettcher, OT/L   Acute OT Clinical Specialist [place] Pager [phone] Office [phone]   [facility] 11/13/2021, 10:49 AM

## 2021-11-13 NOTE — Progress Notes (Signed)
Mobility Specialist Progress Note    11/13/21 1611  Mobility  Activity Ambulated with assistance in hallway  Level of Assistance Contact guard assist, steadying assist  Assistive Device Front wheel walker  Distance Ambulated (ft) 220 ft  Activity Response Tolerated fair  $Mobility charge 1 Mobility   Pre-Mobility: 74 HR  Pt received in bed and agreeable to go to NS for snack. Had BM in BR. Impulsive with movement. Returned to bed with call bell in reach and bed alarm on.   [facility] Mobility Specialist

## 2021-11-13 NOTE — Progress Notes (Signed)
     Daily Progress Note Intern Pager: [phone]  Patient name: Diamond Bowman Medical record number: [id number] Date of birth: 25-Mar-1966 Age: 56 y.o. Gender: female  Primary Care Provider: Everardo Beals, NP Consultants: Cardiology, orthopedic surgery, electrophysiology Code Status: Full  Pt Overview and Major Events to Date:  6/13: Admitted 6/16: Spontaneously converted to normal sinus rhythm  6/17: Developed maculopapular rash 6/21: rash improving  Assessment and Plan: Diamond Bowman is a 56 year old female presenting with CAP who met sepsis criteria on admission and also noted to be in a-fib with RVR now controlled in NSR but developed extensive maculopapular rash suspected to be related to drug reaction. Pertinent PMH/PSH includes CKD stage 3, hypothyroidism, schizophrenia, intellectual disability, seizure disorder, MDD and tardive dyskinesia.  * Atrial fibrillation (HCC) Remains in NSR.   Cardiology following peripherally. -Toprol XL 33m daily -Continue Eliquis 518mBID -Continue telemetry -Follow-up with cardiology outpatient in 4 weeks  Hyperkalemia Potassium elevated to 5.3 this AM.  Received Lokelma 10g. Overall upward trend in potassium since admission. No specific cause identified at this time.  -AM BMP  Acute maculopapular rash Improving throughout.  -Prednisone 401maily, last day -Triamcinolone cream 1% -Hydroxyzine 56m22mD -Benadryl 12.5mg 6mly -Pepcid 20 mg daily -continue to monitor   FEN/GI: Heart healthy PPx: Eliquis 5 mg twice daily Dispo:Home with home health tomorrow. Barriers include improvement of rash, hyperkalemia.   Subjective:  States she wants breakfast. Would not answer questions about itching/scratching due to being preoccupied with getting breakfast and moving her coloring books.   Objective: Temp:  [98 F (36.7 C)] 98 F (36.7 C) (06/22 0500) Pulse Rate:  [82-85] 82 (06/22 0947) Resp:  [15-18] 18 (06/21 2011) BP:  (102-135)/(45-62) 102/45 (06/22 0947) Weight:  [59.2 kg] 59.2 kg (06/22 0500) Physical Exam: General: Awake, alert, NAD Cardiovascular: RRR, no murmurs auscultated Respiratory: CTA B, normal WOB Skin: Diffuse erythema on face and back, feeding/improved maculopapular rash throughout chest, abdomen, upper extremities, pelvis  Laboratory: Most recent CBC Lab Results  Component Value Date   WBC 21.2 (H) 11/13/2021   HGB 10.2 (L) 11/13/2021   HCT 30.3 (L) 11/13/2021   MCV 96.5 11/13/2021   PLT 143 (L) 11/13/2021   Most recent BMP    Latest Ref Rng & Units 11/13/2021    1:35 AM  BMP  Glucose 70 - 99 mg/dL 93   BUN 6 - 20 mg/dL 22   Creatinine 0.44 - 1.00 mg/dL 1.04   Sodium 135 - 145 mmol/L 136   Potassium 3.5 - 5.1 mmol/L 5.3   Chloride 98 - 111 mmol/L 98   CO2 22 - 32 mmol/L 30   Calcium 8.9 - 10.3 mg/dL 7.9    Imaging/Diagnostic Tests: No results found.  DahbuWells Guiles6/22/2023, 11:00 AM  PGY-1, [facility] pager: [phone] pages welcome Secure chat group [place] [place]

## 2021-11-14 ENCOUNTER — Other Ambulatory Visit (HOSPITAL_COMMUNITY): Payer: Self-pay

## 2021-11-14 DIAGNOSIS — J189 Pneumonia, unspecified organism: Secondary | ICD-10-CM

## 2021-11-14 LAB — BASIC METABOLIC PANEL
Anion gap: 8 (ref 5–15)
BUN: 21 mg/dL — ABNORMAL HIGH (ref 6–20)
CO2: 30 mmol/L (ref 22–32)
Calcium: 8.2 mg/dL — ABNORMAL LOW (ref 8.9–10.3)
Chloride: 100 mmol/L (ref 98–111)
Creatinine, Ser: 0.96 mg/dL (ref 0.44–1.00)
GFR, Estimated: >60 mL/min (ref >60)
Glucose, Bld: 88 mg/dL (ref 70–99)
Potassium: 5.3 mmol/L — ABNORMAL HIGH (ref 3.5–5.1)
Sodium: 138 mmol/L (ref 135–145)

## 2021-11-14 MED ORDER — METOPROLOL SUCCINATE ER 50 MG PO TB24
50.0000 mg | ORAL_TABLET | Freq: Every day | ORAL | 0 refills | Status: DC
  Filled 2021-11-14: qty 30, 30d supply, fill #0

## 2021-11-14 MED ORDER — APIXABAN 5 MG PO TABS
5.0000 mg | ORAL_TABLET | Freq: Two times a day (BID) | ORAL | 0 refills | Status: DC
  Filled 2021-11-14: qty 44, 22d supply, fill #0

## 2021-11-14 MED ORDER — DIPHENHYDRAMINE HCL 12.5 MG/5ML PO ELIX
12.5000 mg | ORAL_SOLUTION | Freq: Every day | ORAL | Status: DC
  Filled 2021-11-14: qty 5

## 2021-11-14 MED ORDER — SODIUM ZIRCONIUM CYCLOSILICATE 5 G PO PACK
5.0000 g | PACK | Freq: Once | ORAL | Status: AC
  Administered 2021-11-14: 5 g via ORAL
  Filled 2021-11-14: qty 1

## 2021-11-14 NOTE — Progress Notes (Signed)
Mobility Specialist Progress Note    11/14/21 1513  Mobility  Activity Ambulated with assistance in hallway  Level of Assistance Contact guard assist, steadying assist  Assistive Device Front wheel walker  Distance Ambulated (ft) 120 ft  Activity Response Tolerated well  $Mobility charge 1 Mobility   Pt received w/ NT needing pericare. Attempted BM, unsuccessful. No complaints. Returned to bed with call bell in reach.   [place] Mobility Specialist

## 2021-11-14 NOTE — Assessment & Plan Note (Signed)
Potassium continues to be elevated to 5.3.  Received Lokelma 10g. Suspect related to Verapamil use previously and now beta blocker use. Not a barrier to discharge. Advise BMP check at follow up with PCP.  -AM BMP

## 2021-12-02 ENCOUNTER — Telehealth (HOSPITAL_COMMUNITY): Payer: Self-pay

## 2021-12-02 ENCOUNTER — Other Ambulatory Visit (HOSPITAL_COMMUNITY): Payer: Self-pay

## 2021-12-02 NOTE — Telephone Encounter (Signed)
Pharmacy Transitions of Care Follow-up Telephone Call  Date of discharge: 11/14/21  Discharge Diagnosis: Atrial Fibrillation   How have you been since you were released from the hospital? Patient has been doing well since discharge. She has no questions or concerns regarding her medications.    Medication changes made at discharge: START taking: Eliquis (apixaban)  metoprolol succinate (TOPROL-XL)   Medication changes verified by the patient? Yes    Medication Accessibility:  Home Pharmacy: [facility]    Was the patient provided with refills on discharged medications? No   Have all prescriptions been transferred from [facility] to home pharmacy? Yes   Is the patient able to afford medications? Has insurance, Medicaid    Medication Review:   APIXABAN (ELIQUIS)  apixaban 5 mg BID on 11/10/21 - Discussed importance of taking medication around the same time everyday  - Advised patient of medications to avoid (NSAIDs, ASA)  - Educated that Tylenol (acetaminophen) will be the preferred analgesic to prevent risk of bleeding  - Emphasized importance of monitoring for signs and symptoms of bleeding (abnormal bruising, prolonged bleeding, nose bleeds, bleeding from gums, discolored urine, black tarry stools)  - Advised patient to alert all providers of anticoagulation therapy prior to starting a new medication or having a procedure   Follow-up Appointments:  [facility] f/u appt confirmed?  Scheduled to see Rex Kras, DO on 12/09/21 @ 3:30 PM.   If their condition worsens, is the pt aware to call PCP or go to the Emergency Dept.? Yes  Final Patient Assessment: Patient is aware to contact cardiology before her appointment for refills. Her appointment with Rex Kras, DO is on 12/09/21 and she is due for refills 12/06/21.

## 2021-12-04 ENCOUNTER — Other Ambulatory Visit: Payer: Self-pay

## 2021-12-05 ENCOUNTER — Other Ambulatory Visit: Payer: Self-pay | Admitting: Cardiology

## 2021-12-05 ENCOUNTER — Other Ambulatory Visit: Payer: Self-pay

## 2021-12-05 DIAGNOSIS — I480 Paroxysmal atrial fibrillation: Secondary | ICD-10-CM

## 2021-12-05 MED ORDER — METOPROLOL SUCCINATE ER 50 MG PO TB24: 50.0000 mg | ORAL_TABLET | Freq: Every day | ORAL | 0 refills | Status: DC

## 2021-12-05 MED ORDER — FUROSEMIDE 20 MG PO TABS: 20.0000 mg | ORAL_TABLET | Freq: Every day | ORAL | 0 refills | Status: AC

## 2021-12-05 MED ORDER — APIXABAN 5 MG PO TABS: 5.0000 mg | ORAL_TABLET | Freq: Two times a day (BID) | ORAL | 0 refills | Status: DC

## 2021-12-05 NOTE — Progress Notes (Signed)
ICD-10-CM   1. Paroxysmal atrial fibrillation (HCC)  I48.0 metoprolol succinate (TOPROL-XL) 50 MG 24 hr tablet    apixaban (ELIQUIS) 5 MG TABS tablet     Meds ordered this encounter  Medications   metoprolol succinate (TOPROL-XL) 50 MG 24 hr tablet    Sig: Take 1 tablet (50 mg total) by mouth daily. Take with or immediately following a meal.    Dispense:  30 tablet    Refill:  0   apixaban (ELIQUIS) 5 MG TABS tablet    Sig: Take 1 tablet (5 mg total) by mouth 2 (two) times daily.    Dispense:  60 tablet    Refill:  0

## 2021-12-09 ENCOUNTER — Encounter: Payer: Self-pay | Admitting: Cardiology

## 2021-12-09 ENCOUNTER — Ambulatory Visit: Payer: Medicaid Other | Admitting: Cardiology

## 2021-12-09 VITALS — BP 138/64 | HR 72 | Temp 97.6°F | Resp 16 | Ht <= 58 in | Wt 121.6 lb

## 2021-12-09 DIAGNOSIS — I480 Paroxysmal atrial fibrillation: Secondary | ICD-10-CM

## 2021-12-09 DIAGNOSIS — E039 Hypothyroidism, unspecified: Secondary | ICD-10-CM

## 2021-12-09 DIAGNOSIS — F209 Schizophrenia, unspecified: Secondary | ICD-10-CM

## 2021-12-09 DIAGNOSIS — F79 Unspecified intellectual disabilities: Secondary | ICD-10-CM

## 2021-12-09 DIAGNOSIS — G40909 Epilepsy, unspecified, not intractable, without status epilepticus: Secondary | ICD-10-CM

## 2021-12-09 DIAGNOSIS — F3181 Bipolar II disorder: Secondary | ICD-10-CM

## 2021-12-09 MED ORDER — ASPIRIN 81 MG PO TBEC: 81.0000 mg | DELAYED_RELEASE_TABLET | Freq: Every day | ORAL | 0 refills | Status: DC

## 2021-12-09 NOTE — Progress Notes (Signed)
ID:  Diamond Bowman, DOB 1965/08/30, MRN [id number]  PCP:  Everardo Beals, NP  Cardiologist:  Rex Kras, DO, [facility] (established care November 04, 2021)  Date: 12/11/21 Last Visit: November 12, 2021  Chief Complaint  Patient presents with   Atrial Fibrillation   Follow-up    4 weeks   HPI  [name] is a 56 y.o. Caucasian female whose past medical history and cardiovascular risk factors include: Documented history of mental retardation, schizophrenia, tardive dyskinesia, history of falls and fractures, thrombocytopenia, seizures, poor historian.  Was hospitalized in June 2023 for shortness of breath, cough, fever she was being treated for severe sepsis secondary to pneumonia and during her hospitalization was having episodes of frequent runs of PSVT.  And eventually went into atrial fibrillation with rapid ventricular rate.  She was started on parenteral Cardizem as well as amiodarone and oral Xarelto and did well initially until she developed a rash.  We discontinued the medications she was then later transitioned to Toprol-XL and Eliquis.  She was also evaluated by cardiac electrophysiology during her hospitalization who recommended that given her low CHA2DS2-VASc score and other relative contraindications of thrombocytopenia and history of falls and fracture to continue anticoagulation for 4 weeks given her recent episode of A-fib and then discontinue and monitor her clinically.  She now presents for follow-up after being on anticoagulation for 4 weeks.  Clinically she is much more awake, alert, and conversing.  She denies angina pectoris or heart failure symptoms.  She continues to have a diffuse rash involving her skin but overall improving.  She is accompanied by her therapeutic provider Meriel Flavors.  The patient's guardian is the mother.   ALLERGIES: Allergies  Allergen Reactions   Amiodarone Rash    Diffuse rash across torso, back, legs arms (notably started on Xarelto,  verapamil and amiodarone at same time - unclear which was culprit)    Rivaroxaban Rash    Diffuse rash across torso, back, legs arms (notably started on Xarelto, verapamil and amiodarone at same time - unclear which was culprit)    Verapamil Rash    Diffuse rash across torso, back, legs arms (notably started on Xarelto, verapamil and amiodarone at same time - unclear which was culprit)    MEDICATION LIST PRIOR TO VISIT: Current Meds  Medication Sig   alendronate (FOSAMAX) 70 MG tablet Take 70 mg by mouth every Thursday. Take with a full glass of water on an empty stomach.   aspirin EC 81 MG tablet Take 1 tablet (81 mg total) by mouth daily. Swallow whole.   cyproheptadine (PERIACTIN) 4 MG tablet Take 4 mg by mouth at bedtime.   divalproex (DEPAKOTE ER) 500 MG 24 hr tablet Take 3 tablets (1,500 mg total) by mouth at bedtime.   docusate sodium (COLACE) 100 MG capsule Take 100 mg by mouth daily.   Emollient (EUCERIN) lotion Apply topically See admin instructions. Apply topically to entire body twice daily   Ferrous Sulfate 140 (45 Fe) MG TBCR Take 1 tablet by mouth daily.   furosemide (LASIX) 20 MG tablet Take 1 tablet (20 mg total) by mouth daily. (Patient taking differently: Take 20 mg by mouth as needed for edema.)   GNP CALCIUM 500 +D3 500-15 MG-MCG TABS Take 1 tablet by mouth daily.   hydrOXYzine (VISTARIL) 50 MG capsule Take 50 mg by mouth 2 (two) times daily.   Krill Oil 1000 MG CAPS Take 1,000 mg by mouth daily.   levothyroxine (SYNTHROID) 75 MCG  tablet Take 75 mcg by mouth daily.   loratadine (CLARITIN) 10 MG tablet Take 10 mg by mouth daily.   metoprolol succinate (TOPROL-XL) 50 MG 24 hr tablet Take 1 tablet (50 mg total) by mouth daily. Take with or immediately following a meal.   mirtazapine (REMERON) 15 MG tablet Take 15 mg by mouth at bedtime.   Multiple Vitamin (MULTIVITAMIN WITH MINERALS) TABS tablet Take 1 tablet by mouth daily.   olanzapine zydis (ZYPREXA) 15 MG  disintegrating tablet Take 1 tablet (15 mg total) by mouth at bedtime.   traZODone (DESYREL) 100 MG tablet Take 300 mg by mouth at bedtime.    [DISCONTINUED] apixaban (ELIQUIS) 5 MG TABS tablet Take 1 tablet (5 mg total) by mouth 2 (two) times daily.     PAST MEDICAL HISTORY: Past Medical History:  Diagnosis Date   Anemia    Bipolar 2 disorder ([place]) 05/14/2012   Chronic kidney disease    stage 3   Chronic kidney disease, stage 3 ([place]) 01/04/2015   Encephalopathy 01/04/2015   Fall    Heart murmur    stable for yrs-from childhood.   Hypothyroidism 05/14/2012   Intellectual disability 05/14/2012   Mental retardation    Monteggia's fracture 03/31/2019   Osteoporosis    Schizophrenia ([place])    Seizures ([place])    last one > 5 yrs   Tardive dyskinesia    Thrombocytopenic disorder ([place]) 05/06/2020   Tinea pedis 11/04/2021   Venous stasis syndrome 11/04/2021    PAST SURGICAL HISTORY: Past Surgical History:  Procedure Laterality Date   I & D EXTREMITY Right 03/13/2019   Procedure: IRRIGATION AND DEBRIDEMENT EXTREMITY;  Surgeon: Verner Mould, MD;  Location: [facility];  Service: Orthopedics;  Laterality: Right;   ORIF ULNAR FRACTURE Right 03/13/2019   Procedure: OPEN REDUCTION INTERNAL FIXATION (ORIF) ULNAR FRACTURE;  Surgeon: Verner Mould, MD;  Location: [facility];  Service: Orthopedics;  Laterality: Right;   RADIAL HEAD ARTHROPLASTY Right 03/13/2019   Procedure: RADIAL HEAD ARTHROPLASTY;  Surgeon: Verner Mould, MD;  Location: [facility];  Service: Orthopedics;  Laterality: Right;    FAMILY HISTORY: The patient family history includes Colon polyps (age of onset: 58) in her mother; Diabetes in her father; Osteoarthritis in her mother.  SOCIAL HISTORY:  The patient  reports that she has quit smoking. Her smoking use included cigarettes. She has never used smokeless tobacco. She reports that she does not drink alcohol and does not use drugs.  REVIEW OF SYSTEMS: Review of  Systems  Cardiovascular:  Negative for chest pain, claudication, dyspnea on exertion, irregular heartbeat, leg swelling, near-syncope, orthopnea, palpitations, paroxysmal nocturnal dyspnea and syncope.  Respiratory:  Negative for shortness of breath.   Hematologic/Lymphatic: Negative for bleeding problem.  Skin:  Positive for rash.  Musculoskeletal:  Negative for muscle cramps and myalgias.  Neurological:  Negative for dizziness and light-headedness.    PHYSICAL EXAM:    12/09/2021    3:32 PM 11/14/2021    1:58 PM 11/14/2021    9:26 AM  Vitals with BMI  Height '4\' 6"'$     Weight 121 lbs 10 oz    BMI 95.6    Systolic 213 086   Diastolic 64 59   Pulse 72 68 80   CONSTITUTIONAL: No acute distress, appears older than stated age, hemodynamically stable SKIN: Skin is warm and dry. No cyanosis. No pallor. No jaundice.  Rash improved, non pruritic (anterior/posterior torso, dorsum/ventral aspect of thighs).  HEAD: Normocephalic  and atraumatic.  EYES: No scleral icterus MOUTH/THROAT: Moist oral membranes.  NECK: No JVD present. No thyromegaly noted. No carotid bruits  CHEST Normal respiratory effort. No intercostal retractions  LUNGS CTAB, kyphosis noted posteriorly CARDIOVASCULAR: Regular, S1S2, no murmurs rubs or gallops. ABDOMINAL: Soft, nontender, nondistended, positive bowel sounds in all 4 quadrants, no apparent ascites.  EXTREMITIES: No pitting edema, warm to touch, 2+ bilateral DP and PT pulses HEMATOLOGIC: No significant bruising   CARDIAC DATABASE: EKG: 12/09/2021: Sinus rhythm, 68 bpm, normal axis, without underlying ischemia injury pattern.  Echocardiogram: 11/04/2021: LVEF 55-60%, no regional wall motion abnormalities, normal diastolic function, right ventricular size and function normal, no significant valvular heart disease.  LABORATORY DATA:    Latest Ref Rng & Units 11/13/2021    1:35 AM 11/12/2021   12:07 PM 11/10/2021    3:16 AM  CBC  WBC 4.0 - 10.5 K/uL 21.2  21.9   11.9   Hemoglobin 12.0 - 15.0 g/dL 10.2  9.1  10.3   Hematocrit 36.0 - 46.0 % 30.3  26.8  30.6   Platelets 150 - 400 K/uL 143  127  103        Latest Ref Rng & Units 11/14/2021    1:39 AM 11/13/2021    1:35 AM 11/12/2021   12:07 PM  CMP  Glucose 70 - 99 mg/dL 88  93  105   BUN 6 - 20 mg/dL '21  22  25   '$ Creatinine 0.44 - 1.00 mg/dL 0.96  1.04  1.32   Sodium 135 - 145 mmol/L 138  136  133   Potassium 3.5 - 5.1 mmol/L 5.3  5.3  4.9   Chloride 98 - 111 mmol/L 100  98  99   CO2 22 - 32 mmol/L '30  30  27   '$ Calcium 8.9 - 10.3 mg/dL 8.2  7.9  7.6   Total Protein 6.5 - 8.1 g/dL   4.9   Total Bilirubin 0.3 - 1.2 mg/dL   0.3   Alkaline Phos 38 - 126 U/L   39   AST 15 - 41 U/L   17   ALT 0 - 44 U/L   17     Lipid Panel  No results found for: "CHOL", "TRIG", "HDL", "CHOLHDL", "VLDL", "LDLCALC", "LDLDIRECT", "LABVLDL"  No components found for: "NTPROBNP" No results for input(s): "PROBNP" in the last 8760 hours. Recent Labs    11/04/21 0826  TSH 1.312    BMP Recent Labs    11/12/21 1207 11/13/21 0135 11/14/21 0139  NA 133* 136 138  K 4.9 5.3* 5.3*  CL 99 98 100  CO2 '27 30 30  '$ GLUCOSE 105* 93 88  BUN 25* 22* 21*  CREATININE 1.32* 1.04* 0.96  CALCIUM 7.6* 7.9* 8.2*  GFRNONAA 47* >60 >60    HEMOGLOBIN A1C Lab Results  Component Value Date   HGBA1C 5.2 11/05/2021   MPG 102.54 11/05/2021    IMPRESSION:    ICD-10-CM   1. Paroxysmal atrial fibrillation (HCC)  I48.0 EKG 12-Lead    aspirin EC 81 MG tablet    2. Schizophrenia, unspecified type ([place])  F20.9     3. Intellectual disability  F79     4. Seizure disorder ([place])  G40.909     5. Bipolar 2 disorder ([place])  F31.81     6. Hypothyroidism, unspecified type  E03.9        RECOMMENDATIONS: Diamond Bowman is a 56 y.o. Caucasian female whose past medical history and cardiac  risk factors include: Documented history of mental retardation, schizophrenia, tardive dyskinesia, history of falls and fractures,  thrombocytopenia, seizures, poor historian.  Paroxysmal atrial fibrillation (HCC) Isolated episode likely precipitated by severe sepsis due to pneumonia during her recent hospitalization in June 2023. Converted to NSR on November 07, 2021.  Patient developed a rash -underlying cause not identified.  But discontinued recently started medications which included amiodarone, Xarelto.  Please see EMR for additional details including pharmacy note.  Patient was evaluated by EP during her recent hospitalization due to A-fib with RVR.  CHA2DS2-VASc SCORE is 1 which correlates to 1.3 % risk of stroke per year (gender).  EKG today showed noted sinus rhythm. Shared decision is to stop oral anticoagulation as she is completed 4 weeks of therapy, low CHA2DS2-VASc score, her recent episode of A-fib likely precipitated by severe sepsis due to pneumonia, also has had history of falls/fractures, and chronic thrombocytopenia. We will transition Eliquis to aspirin 81 mg p.o. daily  Her rash has improved since discharge.  Patient is advised to follow-up with PCP and if needed consider dermatology consultation.  We will defer the management of her other chronic comorbid conditions to her primary care provider.  I like to see the patient on as-needed basis.  Recommendations conveyed to both the patient and her therapeutic provider.    FINAL MEDICATION LIST END OF ENCOUNTER: Meds ordered this encounter  Medications   aspirin EC 81 MG tablet    Sig: Take 1 tablet (81 mg total) by mouth daily. Swallow whole.    Dispense:  90 tablet    Refill:  0    Medications Discontinued During This Encounter  Medication Reason   apixaban (ELIQUIS) 5 MG TABS tablet      Current Outpatient Medications:    alendronate (FOSAMAX) 70 MG tablet, Take 70 mg by mouth every Thursday. Take with a full glass of water on an empty stomach., Disp: , Rfl:    aspirin EC 81 MG tablet, Take 1 tablet (81 mg total) by mouth daily. Swallow  whole., Disp: 90 tablet, Rfl: 0   cyproheptadine (PERIACTIN) 4 MG tablet, Take 4 mg by mouth at bedtime., Disp: , Rfl:    divalproex (DEPAKOTE ER) 500 MG 24 hr tablet, Take 3 tablets (1,500 mg total) by mouth at bedtime., Disp: , Rfl:    docusate sodium (COLACE) 100 MG capsule, Take 100 mg by mouth daily., Disp: , Rfl:    Emollient (EUCERIN) lotion, Apply topically See admin instructions. Apply topically to entire body twice daily, Disp: , Rfl:    Ferrous Sulfate 140 (45 Fe) MG TBCR, Take 1 tablet by mouth daily., Disp: , Rfl:    furosemide (LASIX) 20 MG tablet, Take 1 tablet (20 mg total) by mouth daily. (Patient taking differently: Take 20 mg by mouth as needed for edema.), Disp: 30 tablet, Rfl: 0   GNP CALCIUM 500 +D3 500-15 MG-MCG TABS, Take 1 tablet by mouth daily., Disp: , Rfl:    hydrOXYzine (VISTARIL) 50 MG capsule, Take 50 mg by mouth 2 (two) times daily., Disp: , Rfl:    Krill Oil 1000 MG CAPS, Take 1,000 mg by mouth daily., Disp: , Rfl:    levothyroxine (SYNTHROID) 75 MCG tablet, Take 75 mcg by mouth daily., Disp: , Rfl:    loratadine (CLARITIN) 10 MG tablet, Take 10 mg by mouth daily., Disp: , Rfl:    metoprolol succinate (TOPROL-XL) 50 MG 24 hr tablet, Take 1 tablet (50 mg total) by mouth daily. Take  with or immediately following a meal., Disp: 30 tablet, Rfl: 0   mirtazapine (REMERON) 15 MG tablet, Take 15 mg by mouth at bedtime., Disp: , Rfl:    Multiple Vitamin (MULTIVITAMIN WITH MINERALS) TABS tablet, Take 1 tablet by mouth daily., Disp: , Rfl:    olanzapine zydis (ZYPREXA) 15 MG disintegrating tablet, Take 1 tablet (15 mg total) by mouth at bedtime., Disp: , Rfl:    traZODone (DESYREL) 100 MG tablet, Take 300 mg by mouth at bedtime. , Disp: , Rfl:   Orders Placed This Encounter  Procedures   EKG 12-Lead    There are no Patient Instructions on file for this visit.   --Continue cardiac medications as reconciled in final medication list. --Return if symptoms worsen or fail to  improve. or sooner if needed. --Continue follow-up with your primary care physician regarding the management of your other chronic comorbid conditions.  Patient's questions and concerns were addressed to her satisfaction. She voices understanding of the instructions provided during this encounter.   This note was created using a voice recognition software as a result there may be grammatical errors inadvertently enclosed that do not reflect the nature of this encounter. Every attempt is made to correct such errors.  Rex Kras, [place], [facility]  Pager: [phone] Office: [phone]

## 2022-01-15 ENCOUNTER — Other Ambulatory Visit: Payer: Self-pay | Admitting: Cardiology

## 2022-01-15 DIAGNOSIS — I480 Paroxysmal atrial fibrillation: Secondary | ICD-10-CM

## 2022-02-06 ENCOUNTER — Other Ambulatory Visit: Payer: Self-pay | Admitting: Cardiology

## 2022-02-06 DIAGNOSIS — I480 Paroxysmal atrial fibrillation: Secondary | ICD-10-CM

## 2022-02-07 ENCOUNTER — Other Ambulatory Visit: Payer: Self-pay | Admitting: Cardiology

## 2022-02-07 DIAGNOSIS — I480 Paroxysmal atrial fibrillation: Secondary | ICD-10-CM

## 2022-03-06 ENCOUNTER — Other Ambulatory Visit: Payer: Self-pay | Admitting: Cardiology

## 2022-03-06 DIAGNOSIS — I480 Paroxysmal atrial fibrillation: Secondary | ICD-10-CM

## 2022-03-31 ENCOUNTER — Other Ambulatory Visit: Payer: Self-pay | Admitting: Cardiology

## 2022-03-31 DIAGNOSIS — I480 Paroxysmal atrial fibrillation: Secondary | ICD-10-CM

## 2022-06-30 ENCOUNTER — Other Ambulatory Visit: Payer: Self-pay | Admitting: Cardiology

## 2022-06-30 DIAGNOSIS — I480 Paroxysmal atrial fibrillation: Secondary | ICD-10-CM

## 2022-09-30 ENCOUNTER — Other Ambulatory Visit: Payer: Self-pay | Admitting: Cardiology

## 2022-09-30 DIAGNOSIS — I480 Paroxysmal atrial fibrillation: Secondary | ICD-10-CM

## 2022-12-02 ENCOUNTER — Other Ambulatory Visit: Payer: Self-pay | Admitting: Cardiology

## 2022-12-02 DIAGNOSIS — I480 Paroxysmal atrial fibrillation: Secondary | ICD-10-CM

## 2023-03-03 ENCOUNTER — Emergency Department (HOSPITAL_COMMUNITY): Payer: MEDICAID

## 2023-03-03 ENCOUNTER — Inpatient Hospital Stay (HOSPITAL_COMMUNITY)
Admission: EM | Admit: 2023-03-03 | Discharge: 2023-03-17 | DRG: 493 | Disposition: A | Discharge status: Skilled Nursing Facility | Payer: MEDICAID | Attending: Infectious Diseases | Admitting: Infectious Diseases

## 2023-03-03 ENCOUNTER — Encounter (HOSPITAL_COMMUNITY): Payer: Self-pay

## 2023-03-03 ENCOUNTER — Other Ambulatory Visit: Payer: Self-pay

## 2023-03-03 DIAGNOSIS — F209 Schizophrenia, unspecified: Secondary | ICD-10-CM | POA: Diagnosis present

## 2023-03-03 DIAGNOSIS — M810 Age-related osteoporosis without current pathological fracture: Secondary | ICD-10-CM | POA: Diagnosis present

## 2023-03-03 DIAGNOSIS — Z79890 Hormone replacement therapy (postmenopausal): Secondary | ICD-10-CM

## 2023-03-03 DIAGNOSIS — Z79899 Other long term (current) drug therapy: Secondary | ICD-10-CM

## 2023-03-03 DIAGNOSIS — D696 Thrombocytopenia, unspecified: Secondary | ICD-10-CM | POA: Diagnosis present

## 2023-03-03 DIAGNOSIS — I4891 Unspecified atrial fibrillation: Secondary | ICD-10-CM | POA: Diagnosis present

## 2023-03-03 DIAGNOSIS — S8992XA Unspecified injury of left lower leg, initial encounter: Secondary | ICD-10-CM | POA: Diagnosis not present

## 2023-03-03 DIAGNOSIS — Z5971 Insufficient health insurance coverage: Secondary | ICD-10-CM

## 2023-03-03 DIAGNOSIS — Z888 Allergy status to other drugs, medicaments and biological substances status: Secondary | ICD-10-CM

## 2023-03-03 DIAGNOSIS — D539 Nutritional anemia, unspecified: Secondary | ICD-10-CM | POA: Diagnosis present

## 2023-03-03 DIAGNOSIS — F79 Unspecified intellectual disabilities: Secondary | ICD-10-CM | POA: Diagnosis present

## 2023-03-03 DIAGNOSIS — F3181 Bipolar II disorder: Secondary | ICD-10-CM | POA: Diagnosis present

## 2023-03-03 DIAGNOSIS — G4700 Insomnia, unspecified: Secondary | ICD-10-CM | POA: Diagnosis present

## 2023-03-03 DIAGNOSIS — D509 Iron deficiency anemia, unspecified: Secondary | ICD-10-CM | POA: Diagnosis present

## 2023-03-03 DIAGNOSIS — W010XXA Fall on same level from slipping, tripping and stumbling without subsequent striking against object, initial encounter: Secondary | ICD-10-CM | POA: Diagnosis present

## 2023-03-03 DIAGNOSIS — I129 Hypertensive chronic kidney disease with stage 1 through stage 4 chronic kidney disease, or unspecified chronic kidney disease: Secondary | ICD-10-CM | POA: Diagnosis present

## 2023-03-03 DIAGNOSIS — Z7982 Long term (current) use of aspirin: Secondary | ICD-10-CM

## 2023-03-03 DIAGNOSIS — Y92009 Unspecified place in unspecified non-institutional (private) residence as the place of occurrence of the external cause: Secondary | ICD-10-CM

## 2023-03-03 DIAGNOSIS — Z83719 Family history of colon polyps, unspecified: Secondary | ICD-10-CM

## 2023-03-03 DIAGNOSIS — R6250 Unspecified lack of expected normal physiological development in childhood: Secondary | ICD-10-CM | POA: Diagnosis present

## 2023-03-03 DIAGNOSIS — G40909 Epilepsy, unspecified, not intractable, without status epilepticus: Secondary | ICD-10-CM | POA: Diagnosis present

## 2023-03-03 DIAGNOSIS — S82852A Displaced trimalleolar fracture of left lower leg, initial encounter for closed fracture: Secondary | ICD-10-CM | POA: Diagnosis present

## 2023-03-03 DIAGNOSIS — N1831 Chronic kidney disease, stage 3a: Secondary | ICD-10-CM | POA: Diagnosis present

## 2023-03-03 DIAGNOSIS — Z23 Encounter for immunization: Secondary | ICD-10-CM

## 2023-03-03 DIAGNOSIS — M7989 Other specified soft tissue disorders: Secondary | ICD-10-CM | POA: Diagnosis not present

## 2023-03-03 DIAGNOSIS — Z751 Person awaiting admission to adequate facility elsewhere: Secondary | ICD-10-CM

## 2023-03-03 DIAGNOSIS — Z87891 Personal history of nicotine dependence: Secondary | ICD-10-CM

## 2023-03-03 DIAGNOSIS — Z7983 Long term (current) use of bisphosphonates: Secondary | ICD-10-CM

## 2023-03-03 DIAGNOSIS — Z833 Family history of diabetes mellitus: Secondary | ICD-10-CM | POA: Diagnosis not present

## 2023-03-03 DIAGNOSIS — E039 Hypothyroidism, unspecified: Secondary | ICD-10-CM | POA: Diagnosis present

## 2023-03-03 DIAGNOSIS — S82892A Other fracture of left lower leg, initial encounter for closed fracture: Secondary | ICD-10-CM | POA: Diagnosis not present

## 2023-03-03 LAB — HIV ANTIBODY (ROUTINE TESTING W REFLEX): HIV Screen 4th Generation wRfx: NONREACTIVE

## 2023-03-03 LAB — COMPREHENSIVE METABOLIC PANEL
ALT: 14 U/L (ref 0–44)
AST: 28 U/L (ref 15–41)
Albumin: 2.9 g/dL — ABNORMAL LOW (ref 3.5–5.0)
Alkaline Phosphatase: 39 U/L (ref 38–126)
Anion gap: 8 (ref 5–15)
BUN: 22 mg/dL — ABNORMAL HIGH (ref 6–20)
CO2: 31 mmol/L (ref 22–32)
Calcium: 8.8 mg/dL — ABNORMAL LOW (ref 8.9–10.3)
Chloride: 104 mmol/L (ref 98–111)
Creatinine, Ser: 1.2 mg/dL — ABNORMAL HIGH (ref 0.44–1.00)
GFR, Estimated: 52 mL/min — ABNORMAL LOW (ref >60)
Glucose, Bld: 72 mg/dL (ref 70–99)
Potassium: 4.8 mmol/L (ref 3.5–5.1)
Sodium: 143 mmol/L (ref 135–145)
Total Bilirubin: 0.7 mg/dL (ref 0.3–1.2)
Total Protein: 5.8 g/dL — ABNORMAL LOW (ref 6.5–8.1)

## 2023-03-03 LAB — CBC
HCT: 36.1 % (ref 36.0–46.0)
Hemoglobin: 12 g/dL (ref 12.0–15.0)
MCH: 34.2 pg — ABNORMAL HIGH (ref 26.0–34.0)
MCHC: 33.2 g/dL (ref 30.0–36.0)
MCV: 102.8 fL — ABNORMAL HIGH (ref 80.0–100.0)
Platelets: 41 10*3/uL — ABNORMAL LOW (ref 150–400)
RBC: 3.51 MIL/uL — ABNORMAL LOW (ref 3.87–5.11)
RDW: 13.3 % (ref 11.5–15.5)
WBC: 5.9 10*3/uL (ref 4.0–10.5)
nRBC: 0 % (ref 0.0–0.2)

## 2023-03-03 MED ORDER — TRAZODONE HCL 50 MG PO TABS
300.0000 mg | ORAL_TABLET | Freq: Every day | ORAL | Status: DC
  Administered 2023-03-03 – 2023-03-16 (×13): 300 mg via ORAL
  Filled 2023-03-03 (×13): qty 6

## 2023-03-03 MED ORDER — ENOXAPARIN SODIUM 30 MG/0.3ML IJ SOSY
30.0000 mg | PREFILLED_SYRINGE | INTRAMUSCULAR | Status: DC
  Administered 2023-03-03: 30 mg via SUBCUTANEOUS
  Filled 2023-03-03: qty 0.3

## 2023-03-03 MED ORDER — CYPROHEPTADINE HCL 4 MG PO TABS
4.0000 mg | ORAL_TABLET | Freq: Every day | ORAL | Status: DC
  Administered 2023-03-03 – 2023-03-16 (×13): 4 mg via ORAL
  Filled 2023-03-03 (×15): qty 1

## 2023-03-03 MED ORDER — ASPIRIN 81 MG PO TBEC
81.0000 mg | DELAYED_RELEASE_TABLET | Freq: Every day | ORAL | Status: DC
  Administered 2023-03-04 – 2023-03-17 (×14): 81 mg via ORAL
  Filled 2023-03-03 (×14): qty 1

## 2023-03-03 MED ORDER — ACETAMINOPHEN 325 MG PO TABS
650.0000 mg | ORAL_TABLET | Freq: Four times a day (QID) | ORAL | Status: DC | PRN
  Administered 2023-03-07: 650 mg via ORAL
  Filled 2023-03-03: qty 2

## 2023-03-03 MED ORDER — ACETAMINOPHEN 650 MG RE SUPP: 650.0000 mg | Freq: Four times a day (QID) | RECTAL | Status: DC | PRN

## 2023-03-03 MED ORDER — ADULT MULTIVITAMIN W/MINERALS CH
1.0000 | ORAL_TABLET | Freq: Every day | ORAL | Status: DC
  Administered 2023-03-03 – 2023-03-17 (×15): 1 via ORAL
  Filled 2023-03-03 (×15): qty 1

## 2023-03-03 MED ORDER — OXYCODONE HCL 5 MG PO TABS
5.0000 mg | ORAL_TABLET | ORAL | Status: DC | PRN
  Administered 2023-03-04 – 2023-03-15 (×7): 5 mg via ORAL
  Filled 2023-03-03 (×8): qty 1

## 2023-03-03 MED ORDER — DIVALPROEX SODIUM ER 500 MG PO TB24
1500.0000 mg | ORAL_TABLET | Freq: Every day | ORAL | Status: DC
  Administered 2023-03-03 – 2023-03-16 (×13): 1500 mg via ORAL
  Filled 2023-03-03 (×15): qty 3

## 2023-03-03 MED ORDER — EUCERIN EX LOTN: TOPICAL_LOTION | CUTANEOUS | Status: DC

## 2023-03-03 MED ORDER — FERROUS SULFATE 325 (65 FE) MG PO TABS
325.0000 mg | ORAL_TABLET | Freq: Every day | ORAL | Status: DC
  Administered 2023-03-03 – 2023-03-17 (×15): 325 mg via ORAL
  Filled 2023-03-03 (×15): qty 1

## 2023-03-03 MED ORDER — ACETAMINOPHEN 325 MG PO TABS
650.0000 mg | ORAL_TABLET | Freq: Once | ORAL | Status: AC
  Administered 2023-03-03: 650 mg via ORAL
  Filled 2023-03-03: qty 2

## 2023-03-03 MED ORDER — HYDROXYZINE HCL 50 MG PO TABS
50.0000 mg | ORAL_TABLET | Freq: Two times a day (BID) | ORAL | Status: DC
  Administered 2023-03-03 – 2023-03-17 (×27): 50 mg via ORAL
  Filled 2023-03-03 (×31): qty 1

## 2023-03-03 MED ORDER — OYSTER SHELL CALCIUM/D3 500-5 MG-MCG PO TABS
1.0000 | ORAL_TABLET | Freq: Every day | ORAL | Status: DC
  Administered 2023-03-03 – 2023-03-17 (×15): 1 via ORAL
  Filled 2023-03-03 (×16): qty 1

## 2023-03-03 MED ORDER — LEVOTHYROXINE SODIUM 75 MCG PO TABS
75.0000 ug | ORAL_TABLET | Freq: Every day | ORAL | Status: DC
  Administered 2023-03-05 – 2023-03-17 (×12): 75 ug via ORAL
  Filled 2023-03-03 (×12): qty 1

## 2023-03-03 MED ORDER — MIRTAZAPINE 15 MG PO TABS
15.0000 mg | ORAL_TABLET | Freq: Every day | ORAL | Status: DC
  Administered 2023-03-03 – 2023-03-16 (×13): 15 mg via ORAL
  Filled 2023-03-03 (×14): qty 1

## 2023-03-03 MED ORDER — OLANZAPINE 5 MG PO TBDP
15.0000 mg | ORAL_TABLET | Freq: Every day | ORAL | Status: DC
  Administered 2023-03-03 – 2023-03-16 (×13): 15 mg via ORAL
  Filled 2023-03-03 (×15): qty 3

## 2023-03-03 MED ORDER — METOPROLOL SUCCINATE ER 50 MG PO TB24
50.0000 mg | ORAL_TABLET | Freq: Every day | ORAL | Status: DC
  Administered 2023-03-04 – 2023-03-17 (×14): 50 mg via ORAL
  Filled 2023-03-03 (×14): qty 1

## 2023-03-03 MED ORDER — SENNA 8.6 MG PO TABS
1.0000 | ORAL_TABLET | Freq: Two times a day (BID) | ORAL | Status: DC
  Administered 2023-03-03 – 2023-03-17 (×26): 8.6 mg via ORAL
  Filled 2023-03-03 (×28): qty 1

## 2023-03-03 MED ORDER — ALENDRONATE SODIUM 70 MG PO TABS: 70.0000 mg | ORAL_TABLET | ORAL | Status: DC

## 2023-03-03 MED ORDER — HYDROMORPHONE HCL 1 MG/ML IJ SOLN
0.5000 mg | INTRAMUSCULAR | Status: DC | PRN
  Administered 2023-03-04 (×2): .25 mg via INTRAVENOUS

## 2023-03-03 NOTE — ED Notes (Signed)
Caregiver at bedside. Confirmed that pt tripped and fell and did not hurt her head. Plan of care discussed. Call bell within reach.

## 2023-03-03 NOTE — Plan of Care (Signed)
Problem: Education: Goal: Knowledge of General Education information will improve Description: Including pain rating scale, medication(s)/side effects and non-pharmacologic comfort measures Outcome: Progressing Pt is confused and is A/O to self and place.  She is cognitively impaired.  Pt does not understands why she was admitted into the hospital.  Pt admitted from the ED for LLE pain s/p mech trip and fall where she sustained a left trimalleolar fx.  Pt is NPO after midnight for repair of her LLE.    Problem: Clinical Measurements: Goal: Will remain free from infection Outcome: Progressing S/Sx of infection monitored and assessed q-shift.  Pt has remained afebrile thus far.     Problem: Clinical Measurements: Goal: Respiratory complications will improve Outcome: Progressing Respiratory status monitored and assessed q-shift.  Pt is on room air with PO2 at 100% and respiration rate of 18 breaths per minute.  Pt has not endorsed c/o SOB or DOE.    Problem: Clinical Measurements: Goal: Cardiovascular complication will be avoided Outcome: Progressing Pt's VS WNL.    Problem: Activity: Goal: Risk for activity intolerance will decrease Outcome: Progressing Pt is dependent of all her ADLs.  She cannot get up OOB.   Problem: Nutrition: Goal: Adequate nutrition will be maintained Outcome: Progressing Pt is on a regular diet per MD's orders and can tolerate it w/o s/sx of abdominal pain/ distention or n/v.  She is NPO after midnight for repair of her LLE.     Problem: Safety: Goal: Ability to remain free from injury will improve Outcome: Progressing Pt has remained free from falls thus far.  Instructed pt to utilize RN call light for assistance.  Hourly rounds performed.  Bed alarm implemented to keep pt safe from falls.  Settings activated to third most sensitive mode.  Bed in lowest position, locked with two upper side rails engaged.  Belongings and call light within reach.    Problem:  Skin Integrity: Goal: Risk for impaired skin integrity will decrease Outcome: Progressing Skin integrity monitored and assessed q-shift.  Pt is on q2 hourly turns to prevent further skin impairment.  Tubes and drains assessed for device related pressures sores.  Pt is incontinent of both bowel and bladder.  She is checked q2 hours for bowel incontinence.  Perineal care given promptly after each episode.

## 2023-03-03 NOTE — Progress Notes (Addendum)
TOC consulted for SNF. Per chart review, pt has [facility] [employer] Medicaid; therefore, pt does not have any SNF benefits. CSW notified PA and RNCM via secure chat. RNCM to assist with any home health needs.   CSW attempted to contact [place], caregiver, without success.  Addend @ 12:28 PM CSW spoke with [place] who stated doctor and RN are in the room. [name] stated she will call CSW back.

## 2023-03-03 NOTE — ED Triage Notes (Signed)
BIBM from home d/t L leg pain s/p mech trip and fall earlier today. Home health aid sent pt for eval d/t pt reporting she cannot walk d/t pain.

## 2023-03-03 NOTE — Progress Notes (Addendum)
Pt lives in Group Home managed by [facility] # [phone]. Hillary Bow has taken care of the patient for 19 years. Hillary Bow would like to be notified after surgery on pt status.  Hillary Bow states patients legal guardian is her mother, Aura Dials #[phone], who is unable to come to the hospital but can give Verbal Consent for procedure.

## 2023-03-03 NOTE — ED Notes (Signed)
ED TO INPATIENT HANDOFF REPORT  ED Nurse Name and Phone #: Dahlia Client [phone]  S Name/Age/Gender Diamond Bowman 57 y.o. female Room/Bed: H015C/H015C  Code Status   Code Status: Full Code  Home/SNF/Other Home Patient oriented to: self and place Is this baseline? Yes   Triage Complete: Triage complete  Chief Complaint Left trimalleolar fracture, closed, initial encounter [S82.852A]  Triage Note [facility] from home d/t L leg pain s/p mech trip and fall earlier today. Home health aid sent pt for eval d/t pt reporting she cannot walk d/t pain.   Allergies Allergies  Allergen Reactions   Amiodarone Rash    Diffuse rash across torso, back, legs arms (notably started on Xarelto, verapamil and amiodarone at same time - unclear which was culprit)    Rivaroxaban Rash    Diffuse rash across torso, back, legs arms (notably started on Xarelto, verapamil and amiodarone at same time - unclear which was culprit)    Verapamil Rash    Diffuse rash across torso, back, legs arms (notably started on Xarelto, verapamil and amiodarone at same time - unclear which was culprit)    Level of Care/Admitting Diagnosis ED Disposition     ED Disposition  Admit   Condition  --   Comment  Hospital Area: [facility] [[id number]]  Level of Care: Med-Surg [16]  May admit patient to [facility] or [facility] if equivalent level of care is available:: No  Covid Evaluation: Asymptomatic - no recent exposure (last 10 days) testing not required  Diagnosis: Left trimalleolar fracture, closed, initial encounter [387564]  Admitting Physician: Dickie La [[id number]]  Attending Physician: Dickie La [[id number]]  Certification:: I certify this patient will need inpatient services for at least 2 midnights  Expected Medical Readiness: 03/05/2023          B Medical/Surgery History Past Medical History:  Diagnosis Date   Anemia    Bipolar 2 disorder (HCC) 05/14/2012   Chronic kidney disease    stage  3   Chronic kidney disease, stage 3 (HCC) 01/04/2015   Encephalopathy 01/04/2015   Fall    Heart murmur    stable for yrs-from childhood.   Hypothyroidism 05/14/2012   Intellectual disability 05/14/2012   Mental retardation    Monteggia's fracture 03/31/2019   Osteoporosis    Schizophrenia (HCC)    Seizures (HCC)    last one > 5 yrs   Tardive dyskinesia    Thrombocytopenic disorder (HCC) 05/06/2020   Tinea pedis 11/04/2021   Venous stasis syndrome 11/04/2021   Past Surgical History:  Procedure Laterality Date   I & D EXTREMITY Right 03/13/2019   Procedure: IRRIGATION AND DEBRIDEMENT EXTREMITY;  Surgeon: Ernest Mallick, MD;  Location: [facility] OR;  Service: Orthopedics;  Laterality: Right;   ORIF ULNAR FRACTURE Right 03/13/2019   Procedure: OPEN REDUCTION INTERNAL FIXATION (ORIF) ULNAR FRACTURE;  Surgeon: Ernest Mallick, MD;  Location: [facility] OR;  Service: Orthopedics;  Laterality: Right;   RADIAL HEAD ARTHROPLASTY Right 03/13/2019   Procedure: RADIAL HEAD ARTHROPLASTY;  Surgeon: Ernest Mallick, MD;  Location: [facility] OR;  Service: Orthopedics;  Laterality: Right;     A IV Location/Drains/Wounds Patient Lines/Drains/Airways Status     Active Line/Drains/Airways     Name Placement date Placement time Site Days   Peripheral IV 03/03/23 22 G Left Antecubital 03/03/23  1315  Antecubital  less than 1            Intake/Output Last 24 hours No intake  or output data in the 24 hours ending 03/03/23 1357  Labs/Imaging No results found for this or any previous visit (from the past 48 hour(s)). DG Ankle Complete Left  Result Date: 03/03/2023 CLINICAL DATA:  Pain after fall EXAM: LEFT ANKLE COMPLETE - 3 VIEW COMPARISON:  None Available. FINDINGS: Soft tissue swelling about the ankle. Slightly displaced oblique fracture of the distal fibular metaphysis. Mildly displaced additional fracture of the base of the medial malleolus. No dislocation. Preserved bone mineralization.  There also deformity of the posterior aspect of the distal tibia. Additional area of injury. IMPRESSION: Possible trimalleolar fracture.  Soft tissue swelling Electronically Signed   By: Karen Kays M.D.   On: 03/03/2023 10:35   VAS [place] LOWER EXTREMITY VENOUS (DVT) (7a-7p)  Result Date: 03/03/2023  Lower Venous DVT Study Patient Name:  Diamond Bowman  Date of Exam:   03/03/2023 Medical Rec #: [id number]        Accession #:    [id number] Date of Birth: 1965-06-30         Patient Gender: F Patient Age:   56 years Exam Location:  [facility] Procedure:      VAS [place] LOWER EXTREMITY VENOUS (DVT) Referring Phys: AMJAD ALI --------------------------------------------------------------------------------  Indications: Swelling.  Risk Factors: None identified. Limitations: Poor ultrasound/tissue interface and patient positioning. Comparison Study: No prior studies. Performing Technologist: Chanda Busing RVT  Examination Guidelines: A complete evaluation includes B-mode imaging, spectral Doppler, color Doppler, and power Doppler as needed of all accessible portions of each vessel. Bilateral testing is considered an integral part of a complete examination. Limited examinations for reoccurring indications may be performed as noted. The reflux portion of the exam is performed with the patient in reverse Trendelenburg.  +-----+---------------+---------+-----------+----------+--------------+ RIGHTCompressibilityPhasicitySpontaneityPropertiesThrombus Aging +-----+---------------+---------+-----------+----------+--------------+ CFV  Full           Yes      Yes                                 +-----+---------------+---------+-----------+----------+--------------+   +---------+---------------+---------+-----------+----------+--------------+ LEFT     CompressibilityPhasicitySpontaneityPropertiesThrombus Aging +---------+---------------+---------+-----------+----------+--------------+ CFV      Full            Yes      Yes                                 +---------+---------------+---------+-----------+----------+--------------+ SFJ      Full                                                        +---------+---------------+---------+-----------+----------+--------------+ FV Prox  Full                                                        +---------+---------------+---------+-----------+----------+--------------+ FV Mid   Full                                                        +---------+---------------+---------+-----------+----------+--------------+  FV DistalFull                                                        +---------+---------------+---------+-----------+----------+--------------+ PFV      Full                                                        +---------+---------------+---------+-----------+----------+--------------+ POP      Full           Yes      Yes                                 +---------+---------------+---------+-----------+----------+--------------+ PTV      Full                                                        +---------+---------------+---------+-----------+----------+--------------+ PERO     Full                                                        +---------+---------------+---------+-----------+----------+--------------+    Summary: RIGHT: - No evidence of common femoral vein obstruction.   LEFT: - There is no evidence of deep vein thrombosis in the lower extremity. However, portions of this examination were limited- see technologist comments above.  - No cystic structure found in the popliteal fossa.  *See table(s) above for measurements and observations.    Preliminary     Pending Labs Unresulted Labs (From admission, onward)     Start     Ordered   03/04/23 0500  Basic metabolic panel  Tomorrow morning,   R        03/03/23 1248   03/04/23 0500  CBC  Tomorrow morning,   R        03/03/23 1248    03/03/23 1246  Comprehensive metabolic panel  Once,   R        03/03/23 1248   03/03/23 1246  CBC  Once,   R        03/03/23 1248   03/03/23 1245  HIV Antibody (routine testing w rflx)  (HIV Antibody (Routine testing w reflex) panel)  Once,   R        03/03/23 1248            Vitals/Pain Today's Vitals   03/03/23 0811 03/03/23 0813 03/03/23 0939 03/03/23 1152  BP: (!) 153/73   (!) 148/73  Pulse: 60   66  Resp: 18   18  Temp: 98 F (36.7 C)     TempSrc: Oral     SpO2: 100%   100%  Weight: 42 kg     Height: 4\' 6"  (1.372 m)     PainSc:  7  3      Isolation Precautions  No active isolations  Medications Medications  enoxaparin (LOVENOX) injection 30 mg (has no administration in time range)  acetaminophen (TYLENOL) tablet 650 mg (has no administration in time range)    Or  acetaminophen (TYLENOL) suppository 650 mg (has no administration in time range)  oxyCODONE (Oxy IR/ROXICODONE) immediate release tablet 5 mg (has no administration in time range)  HYDROmorphone (DILAUDID) injection 0.5-1 mg (has no administration in time range)  senna (SENOKOT) tablet 8.6 mg (has no administration in time range)  cyproheptadine (PERIACTIN) 4 MG tablet 4 mg (has no administration in time range)  divalproex (DEPAKOTE ER) 24 hr tablet 1,500 mg (has no administration in time range)  ferrous sulfate tablet 325 mg (has no administration in time range)  aspirin EC tablet 81 mg (has no administration in time range)  Calcium Carb-Cholecalciferol 500-15 MG-MCG TABS 1 tablet (has no administration in time range)  hydrOXYzine (ATARAX) tablet 50 mg (has no administration in time range)  levothyroxine (SYNTHROID) tablet 75 mcg (has no administration in time range)  metoprolol succinate (TOPROL-XL) 24 hr tablet 50 mg (has no administration in time range)  mirtazapine (REMERON) tablet 15 mg (has no administration in time range)  multivitamin with minerals tablet 1 tablet (has no administration in time  range)  OLANZapine zydis (ZYPREXA) disintegrating tablet 15 mg (has no administration in time range)  traZODone (DESYREL) tablet 300 mg (has no administration in time range)  acetaminophen (TYLENOL) tablet 650 mg (650 mg Oral Given 03/03/23 0904)    Mobility Ambulatory at baseline     Focused Assessments L ankle fx; splint in place   R Recommendations: See Admitting Provider Note  Report given to:   Additional Notes: Lives at home with 24/7 caregiver; hx developmental delay; follows commands and can answer some questions

## 2023-03-03 NOTE — Consult Note (Cosign Needed Addendum)
Reason for Consult:Left ankle fx Referring Physician: Vivi Barrack Time called: 1201 Time at bedside: 1241   Diamond Bowman is an 57 y.o. female.  HPI: Geannine was at home and dropped some shoes she was carrying. She then tripped over them and fell. She is developmentally delayed and did not c/o pain but refused to walk after that. She was brought to the ED where x-rays showed an ankle fx and orthopedic surgery was consulted. She lives at home with a caregiver and is mostly independent with ADL's.  Past Medical History:  Diagnosis Date   Anemia    Bipolar 2 disorder (HCC) 05/14/2012   Chronic kidney disease    stage 3   Chronic kidney disease, stage 3 (HCC) 01/04/2015   Encephalopathy 01/04/2015   Fall    Heart murmur    stable for yrs-from childhood.   Hypothyroidism 05/14/2012   Intellectual disability 05/14/2012   Mental retardation    Monteggia's fracture 03/31/2019   Osteoporosis    Schizophrenia (HCC)    Seizures (HCC)    last one > 5 yrs   Tardive dyskinesia    Thrombocytopenic disorder (HCC) 05/06/2020   Tinea pedis 11/04/2021   Venous stasis syndrome 11/04/2021    Past Surgical History:  Procedure Laterality Date   I & D EXTREMITY Right 03/13/2019   Procedure: IRRIGATION AND DEBRIDEMENT EXTREMITY;  Surgeon: Ernest Mallick, MD;  Location: [facility] OR;  Service: Orthopedics;  Laterality: Right;   ORIF ULNAR FRACTURE Right 03/13/2019   Procedure: OPEN REDUCTION INTERNAL FIXATION (ORIF) ULNAR FRACTURE;  Surgeon: Ernest Mallick, MD;  Location: [facility] OR;  Service: Orthopedics;  Laterality: Right;   RADIAL HEAD ARTHROPLASTY Right 03/13/2019   Procedure: RADIAL HEAD ARTHROPLASTY;  Surgeon: Ernest Mallick, MD;  Location: [facility] OR;  Service: Orthopedics;  Laterality: Right;    Family History  Problem Relation Age of Onset   Osteoarthritis Mother    Colon polyps Mother 69   Diabetes Father    Colon cancer Neg Hx    Esophageal cancer Neg Hx    Rectal cancer Neg  Hx    Stomach cancer Neg Hx     Social History:  reports that she has quit smoking. Her smoking use included cigarettes. She has never used smokeless tobacco. She reports that she does not drink alcohol and does not use drugs.  Allergies:  Allergies  Allergen Reactions   Amiodarone Rash    Diffuse rash across torso, back, legs arms (notably started on Xarelto, verapamil and amiodarone at same time - unclear which was culprit)    Rivaroxaban Rash    Diffuse rash across torso, back, legs arms (notably started on Xarelto, verapamil and amiodarone at same time - unclear which was culprit)    Verapamil Rash    Diffuse rash across torso, back, legs arms (notably started on Xarelto, verapamil and amiodarone at same time - unclear which was culprit)    Medications: I have reviewed the patient's current medications.  No results found for this or any previous visit (from the past 48 hour(s)).  DG Ankle Complete Left  Result Date: 03/03/2023 CLINICAL DATA:  Pain after fall EXAM: LEFT ANKLE COMPLETE - 3 VIEW COMPARISON:  None Available. FINDINGS: Soft tissue swelling about the ankle. Slightly displaced oblique fracture of the distal fibular metaphysis. Mildly displaced additional fracture of the base of the medial malleolus. No dislocation. Preserved bone mineralization. There also deformity of the posterior aspect of the distal tibia. Additional area  of injury. IMPRESSION: Possible trimalleolar fracture.  Soft tissue swelling Electronically Signed   By: Karen Kays M.D.   On: 03/03/2023 10:35   VAS [place] LOWER EXTREMITY VENOUS (DVT) (7a-7p)  Result Date: 03/03/2023  Lower Venous DVT Study Patient Name:  Diamond Bowman  Date of Exam:   03/03/2023 Medical Rec #: [id number]        Accession #:    [id number] Date of Birth: 08/11/1965         Patient Gender: F Patient Age:   20 years Exam Location:  [facility] Procedure:      VAS [place] LOWER EXTREMITY VENOUS (DVT) Referring Phys: AMJAD ALI  --------------------------------------------------------------------------------  Indications: Swelling.  Risk Factors: None identified. Limitations: Poor ultrasound/tissue interface and patient positioning. Comparison Study: No prior studies. Performing Technologist: Chanda Busing RVT  Examination Guidelines: A complete evaluation includes B-mode imaging, spectral Doppler, color Doppler, and power Doppler as needed of all accessible portions of each vessel. Bilateral testing is considered an integral part of a complete examination. Limited examinations for reoccurring indications may be performed as noted. The reflux portion of the exam is performed with the patient in reverse Trendelenburg.  +-----+---------------+---------+-----------+----------+--------------+ RIGHTCompressibilityPhasicitySpontaneityPropertiesThrombus Aging +-----+---------------+---------+-----------+----------+--------------+ CFV  Full           Yes      Yes                                 +-----+---------------+---------+-----------+----------+--------------+   +---------+---------------+---------+-----------+----------+--------------+ LEFT     CompressibilityPhasicitySpontaneityPropertiesThrombus Aging +---------+---------------+---------+-----------+----------+--------------+ CFV      Full           Yes      Yes                                 +---------+---------------+---------+-----------+----------+--------------+ SFJ      Full                                                        +---------+---------------+---------+-----------+----------+--------------+ FV Prox  Full                                                        +---------+---------------+---------+-----------+----------+--------------+ FV Mid   Full                                                        +---------+---------------+---------+-----------+----------+--------------+ FV DistalFull                                                         +---------+---------------+---------+-----------+----------+--------------+ PFV      Full                                                        +---------+---------------+---------+-----------+----------+--------------+  POP      Full           Yes      Yes                                 +---------+---------------+---------+-----------+----------+--------------+ PTV      Full                                                        +---------+---------------+---------+-----------+----------+--------------+ PERO     Full                                                        +---------+---------------+---------+-----------+----------+--------------+    Summary: RIGHT: - No evidence of common femoral vein obstruction.   LEFT: - There is no evidence of deep vein thrombosis in the lower extremity. However, portions of this examination were limited- see technologist comments above.  - No cystic structure found in the popliteal fossa.  *See table(s) above for measurements and observations.    Preliminary     Review of Systems  Unable to perform ROS: Psychiatric disorder   Blood pressure (!) 148/73, pulse 66, temperature 98 F (36.7 C), temperature source Oral, resp. rate 18, height 4\' 6"  (1.372 m), weight 42 kg, SpO2 100%. Physical Exam Constitutional:      General: She is not in acute distress.    Appearance: She is well-developed. She is not diaphoretic.  HENT:     Head: Normocephalic and atraumatic.  Eyes:     General: No scleral icterus.       Right eye: No discharge.        Left eye: No discharge.     Conjunctiva/sclera: Conjunctivae normal.  Cardiovascular:     Rate and Rhythm: Normal rate and regular rhythm.  Pulmonary:     Effort: Pulmonary effort is normal. No respiratory distress.  Musculoskeletal:     Cervical back: Normal range of motion.     Comments: LLE No traumatic wounds, ecchymosis, or rash  Short leg splint in place  No  knee effusion  Knee stable to varus/ valgus and anterior/posterior stress  Sens DPN, SPN, TN grossly intact  Motor EHL, ext, flex, evers 0/5 (suspect volitional)  Toes perfused, No significant edema  Skin:    General: Skin is warm and dry.  Neurological:     Mental Status: She is alert.  Psychiatric:        Mood and Affect: Mood normal.        Behavior: Behavior normal.    Assessment/Plan: Left ankle fx -- Tentatively plan ORIF tomorrow with Dr. Carola Frost. Please keep NPO after MN.    Freeman Caldron, PA-C Orthopedic Surgery [phone] 03/03/2023, 12:48 PM

## 2023-03-03 NOTE — Progress Notes (Signed)
Left lower extremity venous duplex has been completed. Preliminary results can be found in CV Proc through chart review.  Results were given to Marita Kansas PA.  03/03/23 9:46 AM Olen Cordial RVT

## 2023-03-03 NOTE — ED Provider Notes (Signed)
[place] EMERGENCY DEPARTMENT AT [facility] Provider Note   CSN: [id number] Arrival date & time: 03/03/23  0805     History  Chief Complaint  Patient presents with   Leg Pain    Diamond Bowman is a 57 y.o. female.    57 year old female presents today for evaluation of a mechanical fall that occurred yesterday.  This was a witnessed fall.  No head injury.  No loss of consciousness.  She complains of left foot pain since the fall.  Her caregiver also states that her left leg is more swollen than usual.  The history is provided by the patient and a caregiver. No language interpreter was used.       Home Medications Prior to Admission medications   Medication Sig Start Date End Date Taking? Authorizing Provider  alendronate (FOSAMAX) 70 MG tablet Take 70 mg by mouth every Thursday. Take with a full glass of water on an empty stomach.    [provider]  cyproheptadine (PERIACTIN) 4 MG tablet Take 4 mg by mouth at bedtime.    [provider]  divalproex (DEPAKOTE ER) 500 MG 24 hr tablet Take 3 tablets (1,500 mg total) by mouth at bedtime. 05/15/12   Rhetta Mura, MD  docusate sodium (COLACE) 100 MG capsule Take 100 mg by mouth daily.    [provider]  Emollient (EUCERIN) lotion Apply topically See admin instructions. Apply topically to entire body twice daily    [provider]  Ferrous Sulfate 140 (45 Fe) MG TBCR Take 1 tablet by mouth daily.    [provider]  furosemide (LASIX) 20 MG tablet Take 1 tablet (20 mg total) by mouth daily. Patient taking differently: Take 20 mg by mouth as needed for edema. 12/05/21   Tolia, Sunit, DO  GNP ASPIRIN 81 MG tablet TAKE ONE TABLET BY MOUTH DAILY. swallow whole 02/09/22   Tolia, Sunit, DO  GNP CALCIUM 500 +D3 500-15 MG-MCG TABS Take 1 tablet by mouth daily. 10/03/21   [provider]  hydrOXYzine (VISTARIL) 50 MG capsule Take 50 mg by mouth 2 (two) times daily. 07/29/21    [provider]  Boris Lown Oil 1000 MG CAPS Take 1,000 mg by mouth daily.    [provider]  levothyroxine (SYNTHROID) 75 MCG tablet Take 75 mcg by mouth daily. 08/18/21   [provider]  loratadine (CLARITIN) 10 MG tablet Take 10 mg by mouth daily.    [provider]  metoprolol succinate (TOPROL-XL) 50 MG 24 hr tablet TAKE ONE TABLET BY MOUTH DAILY take with or immediately following a meal 09/30/22   Tolia, Sunit, DO  mirtazapine (REMERON) 15 MG tablet Take 15 mg by mouth at bedtime.    [provider]  Multiple Vitamin (MULTIVITAMIN WITH MINERALS) TABS tablet Take 1 tablet by mouth daily.    [provider]  olanzapine zydis (ZYPREXA) 15 MG disintegrating tablet Take 1 tablet (15 mg total) by mouth at bedtime. 01/04/15   Calvert Cantor, MD  traZODone (DESYREL) 100 MG tablet Take 300 mg by mouth at bedtime.     [provider]      Allergies    Amiodarone, Rivaroxaban, and Verapamil    Review of Systems   Review of Systems  Constitutional:  Negative for chills and fever.  Musculoskeletal:  Positive for arthralgias.  All other systems reviewed and are negative.   Physical Exam Updated Vital Signs BP (!) 153/73 (BP Location: Right Arm)   Pulse  60   Temp 98 F (36.7 C) (Oral)   Resp 18   Ht 4\' 6"  (1.372 m)   Wt 42 kg   SpO2 100%   BMI 22.33 kg/m  Physical Exam Vitals and nursing note reviewed.  Constitutional:      General: She is not in acute distress.    Appearance: Normal appearance. She is not ill-appearing.  HENT:     Head: Normocephalic and atraumatic.     Nose: Nose normal.  Eyes:     Conjunctiva/sclera: Conjunctivae normal.  Pulmonary:     Effort: Pulmonary effort is normal. No respiratory distress.  Musculoskeletal:        General: No deformity. Normal range of motion.     Comments: Bilateral lower extremities with good range of motion.  All major joints without tenderness to palpation.  Neurovascularly  intact.  There is some tenderness to palpation in the left foot particularly with plantar and dorsiflexion.  Skin:    Findings: No rash.  Neurological:     Mental Status: She is alert.     ED Results / Procedures / Treatments   Labs (all labs ordered are listed, but only abnormal results are displayed) Labs Reviewed - No data to display  EKG None  Radiology No results found.  Procedures Procedures    Medications Ordered in ED Medications  acetaminophen (TYLENOL) tablet 650 mg (650 mg Oral Given 03/03/23 4132)    ED Course/ Medical Decision Making/ A&P                                 Medical Decision Making Amount and/or Complexity of Data Reviewed Radiology: ordered. ECG/medicine tests: ordered.  Risk OTC drugs. Decision regarding hospitalization.   57 year old female with history of developmental delay presents today for concern of left leg pain and swelling.  Caregiver states the fall was witnessed by her.  She states patient tripped over her shoes.  No head injury or loss of consciousness.  Left ankle x-ray and DVT study of left leg ordered.  Left ankle x-ray shows evidence of trimalar fracture.  DVT study negative.  Discussed with Ortho PA Earney Hamburg who recommends short leg with stirrup and follow-up with Dr. Eulah Pont in 1 week.  Caregiver has concern for patient being able to go home since she has to be nonweightbearing and the ability to care for her.  They would prefer to pursue SNF placement.  Discussed with admitting team who will evaluate patient for admission.   Final Clinical Impression(s) / ED Diagnoses Final diagnoses:  Closed trimalleolar fracture of left ankle, initial encounter    Rx / DC Orders ED Discharge Orders     None         Marita Kansas, PA-C 03/03/23 1259    Loetta Rough, MD 03/06/23 1257

## 2023-03-03 NOTE — ED Notes (Signed)
Called ortho tech to inform of order for splint.

## 2023-03-03 NOTE — Progress Notes (Signed)
PT Cancellation Note  Patient Details Name: Diamond Bowman MRN: [id number] DOB: Jul 09, 1965   Cancelled Treatment:    Reason Eval/Treat Not Completed: Patient not medically ready. Pt to have surgery for ankle fx. Will defer until after surgery.    Angelina Ok [facility] 03/03/2023, 12:43 PM Skip Mayer PT Acute [employer] [phone]

## 2023-03-03 NOTE — Progress Notes (Signed)
Pt admitted from the ED for LLE pain s/p mech trip and fall where she sustained a left trimalleolar fx.  She was transported via gurney accompanied by a transporter.  Reviewed AVS with pt and made her aware of her surrounding.  Instructed pt to utilize RN call light for assistance.  Bed alarm implemented to keep pt safe from falls.  Settings activated to third most sensitive mode.  Bed locked, in lowest position with two upper/ one lower side rails engaged.  Belongings and call light within reach.

## 2023-03-03 NOTE — Progress Notes (Addendum)
Per PA, pt is being admitted. Inpatient TOC team to follow for discharge needs.   Addend @ 1:03PM CSW received call back from [facility]. CSW notified Inpatient TOC team will contact to discuss discharge planning. Leiona verbalized understanding.

## 2023-03-03 NOTE — Discharge Summary (Signed)
RNCM consulted regarding home health options.  Pt has [employer] that does not have home health benefits.  RNCM reached out to caregiver, [name] who states that she is unable to manage pt in her deconditioned current state. TOC team will continue to follow and develop disposition plan.

## 2023-03-03 NOTE — Progress Notes (Signed)
Orthopedic Tech Progress Note Patient Details:  Diamond Bowman 03/15/1966 [id number]  Ortho Devices Type of Ortho Device: Post (short leg) splint, Stirrup splint Ortho Device/Splint Location: lle Ortho Device/Splint Interventions: Application, Adjustment, Ordered  I had the pt tech assist holding the leg while I applied the splint. Post Interventions Patient Tolerated: Well Instructions Provided: Care of device, Adjustment of device  Trinna Post 03/03/2023, 11:47 AM

## 2023-03-03 NOTE — H&P (Signed)
Date: 03/03/2023               Patient Name:  Diamond Bowman MRN: [id number]  DOB: [birth year] Age / Sex: 57 y.o., female   PCP: [employer] [place]         Medical Service: Internal Medicine Teaching Service         Attending Physician: Dr. Dickie La, MD    First Contact: Dr. Philomena Doheny, MD Pager: [phone]  Second Contact: Dr. [name] Pager: [phone]       After Hours (After 5p/  First Contact Pager: [phone]  weekends / holidays): Second Contact Pager: [phone]   Chief Complaint: ankle pain  History of Present Illness: Kassie Mends. Mcclean is a 57 y.o. F with PMH bipolar 2 disorder, schizophrenia, intellectual disability, osteoporosis, CKD stage 3, heart murmur, hypothyroidism, osteoporosis, seizures who presents after a mechanical fall at home 10/08 resulting in ankle pain limiting mobility.  History provided by her caregiver and herself. Patient dropped her shoes on the floor yesterday and tripped over them, resulting in a fall. Since the fall she has had L ankle pain and has been refusing to walk on the limb. She has had no other concerns. She is normally ambulatory without limitations.  There is concern that she will need extra assistance while non-ambulatory at least initially as OP and her caregiver would like her to go to a facility if possible. IMTS paged for admission.  Meds:  Alendronate 70 mg once weekly Cyproheptadine 4 mg daily at bedtime Depakote 1500 mg daily at bedtime Colace 100 mg daily Ferrous sulfate 140 mg daily Furosemide 20 mg daily PRN edema ASA 81 mg daily Calcium 500 + D3 15 mcg tablet daily Hydroxyzine 50 mg BID Krill oil 1000 mg daily Levothyroxine 75 mcg daily Loratidine 10 mg daily Metoprolol 50 mg daily Mirtazapine 15 mg daily at bedtime MV 1 tablet daily Olanzapine zydis 15 mg daily at bedtime Trazodone 300 mg daily at bedtime  Allergies: Allergies as of 03/03/2023 - Review Complete 03/03/2023  Allergen  Reaction Noted   Amiodarone Rash 11/10/2021   Rivaroxaban Rash 11/10/2021   Verapamil Rash 11/11/2021   Past Medical History: Past Medical History:  Diagnosis Date   Anemia    Bipolar 2 disorder (HCC) 05/14/2012   Chronic kidney disease    stage 3   Chronic kidney disease, stage 3 (HCC) 01/04/2015   Encephalopathy 01/04/2015   Fall    Heart murmur    stable for yrs-from childhood.   Hypothyroidism 05/14/2012   Intellectual disability 05/14/2012   Mental retardation    Monteggia's fracture 03/31/2019   Osteoporosis    Schizophrenia (HCC)    Seizures (HCC)    last one > 5 yrs   Tardive dyskinesia    Thrombocytopenic disorder (HCC) 05/06/2020   Tinea pedis 11/04/2021   Venous stasis syndrome 11/04/2021   Past Surgical History: Past Surgical History:  Procedure Laterality Date   I & D EXTREMITY Right 03/13/2019   Procedure: IRRIGATION AND DEBRIDEMENT EXTREMITY;  Surgeon: Ernest Mallick, MD;  Location: [facility] OR;  Service: Orthopedics;  Laterality: Right;   ORIF ULNAR FRACTURE Right 03/13/2019   Procedure: OPEN REDUCTION INTERNAL FIXATION (ORIF) ULNAR FRACTURE;  Surgeon: Ernest Mallick, MD;  Location: [facility] OR;  Service: Orthopedics;  Laterality: Right;   RADIAL HEAD ARTHROPLASTY Right 03/13/2019   Procedure: RADIAL HEAD ARTHROPLASTY;  Surgeon: Ernest Mallick, MD;  Location: [facility] OR;  Service:  Orthopedics;  Laterality: Right;   Family History: None provided.  Social History: During the day, patient goes to an adult day care and uses [employer] for transportation. She stays with her caregiver Leiona at night. She is largely independent in ADLs but requires assistance with IADLs.  Review of Systems: A complete ROS was negative except as per HPI.   Physical Exam: Blood pressure (!) 148/73, pulse 66, temperature 98 F (36.7 C), temperature source Oral, resp. rate 18, height 4\' 6"  (1.372 m), weight 42 kg, SpO2 100%. Constitutional:Appears stated age, resting comfortably in  hospital bed. In no acute distress. Cardio:Regular rate and rhythm. No murmurs, rubs, or gallops. Pulm:Clear to auscultation bilaterally. Normal work of breathing on room air. NWG:NFAOZHYQ for extremity edema. LLE in soft splint. Able to wiggle toes on that foot. Skin:Warm and dry. Neuro:Awake, alert. Communicates wants/needs. Spontaneously moves all extremities. Follows commands.  Psych:Pleasant mood and affect.  EKG: Pending.  L ankle x-ray: Possible trimalleolar fracture. Soft tissue swelling.  Assessment & Plan by Problem: Principal Problem:   Left trimalleolar fracture, closed, initial encounter  L probable trimalleolar fracture, closed Osteoporosis After mechanical fall. Patient is also treated for osteoporosis. Appears comfortable on my exam, has received tylenol alone. Has soft cast on LLE. Orthopedic surgery recommendations pending at this time. There is concern per caregiver that she will need at least brief stay at a rehab facility before she can safely return home, which at this time seems to be her greatest barrier to discharge. Plan: -TOC/SW, PT, OT consulted  -Pain management: tylenol 650 mg q6h PRN mild pain, oxycodone 5 mg q4h PRN moderate pain, dilaudid 0.5-1 mg q2h PRN severe pain -Bowel regimen: senna 8.6 mg BID -F/u orthopedic surgery recommendations -Continue home alendronate 70 mg weekly on Thursday's, calcium carbonate-cholecalciferol 500-15 mg-mcg daily, MV one tablet daily  Intellectual disability Bipolar 2 disorder Schizophrenia Insomnia Patient is able to communicate needs and concerns. Medical history paperwork provided by her caregiver does explain that she has difficulty sleeping, has food obsession with rapid eating requiring reminders to eat slowly, has water-seeking behaviors, and some aggression. She has hallucinations  (auditory) that she sometimes reacts to by shaking her fist/hitting in the direction of the voices, respond to them (yelling). OP  regimen includes olanzapine, trazodone, mirtazapine, cyproheptadine, hydroxyzine.  Plan: -Continue home olanzapine 15 mg daily at bedtime, trazodone 300 mg daily at bedtime, mirtazapine 15 mg daily at bedtime, cyproheptadine 4 mg daily at bedtime, hydroxyzine 50 mg BID  Seizure disorder Plan: -Continue home depakote 1500 mg daily at bedtime  Hypothyroidism Plan: -Continue home levothyroxine 75 mcg daily  Hx atrial fibrillation on aspirin Per cardiology note 11/2021, felt to be isolated episode precipitated yb severe sepsis in June 2023. Shared decision making to stop eliquis and transition to ASA. In regular rate and rhythm on my exam. Plan: -Continue home metoprolol 50 mg daily  Hx iron deficiency anemia Plan: -CBC pending -Continue home ferrous sulfate  Dispo: Admit patient to Inpatient with expected length of stay greater than 2 midnights.  Signed: Champ Mungo, DO 03/03/2023, 1:23 PM  After 5pm on weekdays and 1pm on weekends: On Call pager: [phone]

## 2023-03-03 NOTE — Hospital Course (Addendum)
Diamond Bowman. Diamond Bowman is a 57 y.o. F with PMH bipolar 2 disorder, schizophrenia, intellectual disability, osteoporosis, CKD stage 3, heart murmur, hypothyroidism, osteoporosis, seizures who presents after a mechanical fall at home 10/08 resulting in left ankle pain limiting mobility. Patient previously ambulated without much assistance. Patient goes to adult day care and uses [employer] for transportation, stays with her caregiver Leiona at night.   L trimalleolar fracture, closed Osteoporosis On day of admission, patient's LLE was placed in a soft cast and she was given tylenol for pain management. Orthopedic surgery was consulted when imaging showed possible trimalleolar fracture and soft tissue swelling. Patient received preoperative antibiotics and a regional block. Dr. Carola Frost with orthopedics performed a ORIF of L ankle on 10/10 and patient's LLE was placed in a splint. She was initially non-weight bearing for the first 3-5 days post surgery. It was changed to a short cast on 10/17. Her pain was initially managed with PRN Tylenol, oxycodone and dilaudid. Tylenol later scheduled as patient didn't always consistently communicate her pain needs. Dilaudid was discontinued when pain improved. Patient's home alendronate 70 mg weekly (given on Thursdays) was administered on 10/17 with precautions. Her home calcium carbonate-cholecalciferol 500-15 mg-mcg daily, MV one tablet daily was resumed on admission. Patient was discharged with scheduled Tylenol. Repeat imaging was performed 10 days post surgery which revealed unchanged alignment of distal fibular and medial malleolar fractures post ORIF. No hardware complication.PT/OT helped evaluate patient and recommended SNF. SW and case manager assisted with placement    Intellectual disability Bipolar 2 disorder Schizophrenia Insomnia Patient was able to communicate most needs verbally. She was interactive during the admission and was observed making requests for food or  items, and she was also cooperative during daily rounds by following simple prompts. Her home medications for bipolar 2 disorder, schizophrenia, and insomnia were resumed on admission and included olanzapine 15 mg daily at bedtime, trazodone 300 mg daily at bedtime, mirtazapine 15 mg daily at bedtime, cyproheptadine 4 mg daily at bedtime, hydroxyzine 50 mg BID. No medication changes to her home regimen were made during this admission.    Seizure disorder Patient with history of seizure disorder. Did not present with any seizure-like events during hospitalization. Resumed her home depakote 1500 mg daily at bedtime on admission.    Hypothyroidism Chronic. TSH checked 10/12, 2.791 WNL. Resumed home levothyroxine 75 mcg daily on admission.   Hx atrial fibrillation on aspirin Cardiology note 11/2021 alludes to episode of afib potentially due to severe sepsis June 2023. Patient and patient's family shared decision making and stopped Eliquis. Transitioned to daily aspirin 81 mg. Resumed daily aspirin and metoprolol 50 mg daily on admission. Patient with RRR and heart rate is less ranging 98.1-98.5 on day of discharge.   Hx iron deficiency anemia  Chronic. CBC shows stable anemia. Resumed home ferrous sulfate admisison. Iron studies from 10/11 showed Iron level 17, Tsat 7 %, Ferritin 269. Vitamin B12 and folate WNL. Hgb on admission 10.2, post op 9.3. Hgb on day of discharge .   Thrombocytopenia Chronic. Previous work up by hematology, currently stable. Takes depakote for seizures, could be contributing. Platelets were monitored on labs, 102 K/uL 10/18. May need to re-establish with hematology outpatient for follow up. ***  -

## 2023-03-03 NOTE — Evaluation (Signed)
Occupational Therapy Evaluation Patient Details Name: Diamond Bowman MRN: [id number] DOB: 02-25-1966 Today's Date: 03/03/2023   History of Present Illness Pt is 57 year old presented [facility]  [facility]/03/23 for fall with left leg pain. PMH - MDD, BiPolar, Schizophrenia, Mental Retardation, tardive dyskinesia, CKD 3, hypothyroidism   Clinical Impression   PT admitted with s/p fall with pending surgery LLE. Pt currently with functional limitiations due to the deficits listed below (see OT problem list). Pt at baseline requires caregiver (A) and goes to adult day program. Pt at this time fearful of transfer and declines anything more than dangle eob. Caregiver states that when pt becomes "quiet" it means she doesn't want to do a task.  Pt will benefit from skilled OT to increase their independence and safety with adls and balance to allow discharge skilled inpatient follow up therapy, <3 hours/day. Caregiver reports inability to care for pt at home due to neck and back injury. Pt must transfer supervision to d/c home.  .        If plan is discharge home, recommend the following: Two people to help with walking and/or transfers;Two people to help with bathing/dressing/bathroom    Functional Status Assessment  Patient has had a recent decline in their functional status and demonstrates the ability to make significant improvements in function in a reasonable and predictable amount of time.  Equipment Recommendations  Wheelchair (measurements OT);Wheelchair cushion (measurements OT);Other (comment) (if going home might need hoyer for caregiver as she says she cant lift the patient due to neck and back injury)    Recommendations for Other Services       Precautions / Restrictions Precautions Precautions: Fall Restrictions Weight Bearing Restrictions: Yes LLE Weight Bearing: Non weight bearing      Mobility Bed Mobility Overal bed mobility: Needs Assistance Bed Mobility: Rolling, Supine to  Sit, Sit to Supine Rolling: Independent   Supine to sit: Independent Sit to supine: Independent        Transfers                   General transfer comment: declined      Balance Overall balance assessment: Needs assistance Sitting-balance support: No upper extremity supported, Feet supported Sitting balance-Leahy Scale: Good                                     ADL either performed or assessed with clinical judgement   ADL Overall ADL's : Needs assistance/impaired                 Upper Body Dressing : Supervision/safety Upper Body Dressing Details (indicate cue type and reason): able to don coat Lower Body Dressing: Moderate assistance Lower Body Dressing Details (indicate cue type and reason): don sock               General ADL Comments: pt declined sit<>Stand     Vision Baseline Vision/History: 0 No visual deficits Vision Assessment?: No apparent visual deficits     Perception         Praxis         Pertinent Vitals/Pain Pain Assessment Pain Assessment: No/denies pain     Extremity/Trunk Assessment Upper Extremity Assessment Upper Extremity Assessment: Overall WFL for tasks assessed   Lower Extremity Assessment Lower Extremity Assessment: LLE deficits/detail LLE Deficits / Details: pending surgery   Cervical / Trunk Assessment Cervical / Trunk Assessment:  Normal   Communication Communication Communication: Other (comment) (no changes from baseline but does have some difficulty understanding at times)   Cognition Arousal: Alert Behavior During Therapy: WFL for tasks assessed/performed Overall Cognitive Status: History of cognitive impairments - at baseline                                 General Comments: pt very fearful of L LE . pt agreeable only to eob task. pt holding coloring book and fixated on coloring book     General Comments  LLE cast in place pending surgery    Exercises      Shoulder Instructions      Home Living Family/patient expects to be discharged to:: Private residence Living Arrangements: Non-relatives/Friends Dealer) Available Help at Discharge: Personal care attendant Type of Home: House       Home Layout: Two level;Bed/bath upstairs   Alternate Level Stairs-Rails: Right Bathroom Shower/Tub: Chief Strategy Officer: Standard         Additional Comments: adult daycare program during the day      Prior Functioning/Environment Prior Level of Function : Needs assist  Cognitive Assist : ADLs (cognitive)   ADLs (Cognitive): Intermittent cues Physical Assist : Mobility (physical)     Mobility Comments: indep per caregiver present.          OT Problem List: Decreased activity tolerance;Impaired balance (sitting and/or standing);Decreased knowledge of use of DME or AE;Decreased safety awareness;Decreased knowledge of precautions;Pain      OT Treatment/Interventions: Self-care/ADL training;Therapeutic exercise;DME and/or AE instruction;Manual therapy;Modalities;Splinting;Therapeutic activities;Patient/family education;Balance training    OT Goals(Current goals can be found in the care plan section) Acute Rehab OT Goals Patient Stated Goal: to color and asking for lunch OT Goal Formulation: Patient unable to participate in goal setting Time For Goal Achievement: 03/17/23 Potential to Achieve Goals: Good  OT Frequency: Min 1X/week    Co-evaluation              AM-PAC OT "6 Clicks" Daily Activity     Outcome Measure Help from another person eating meals?: A Little Help from another person taking care of personal grooming?: A Little Help from another person toileting, which includes using toliet, bedpan, or urinal?: A Lot Help from another person bathing (including washing, rinsing, drying)?: A Lot Help from another person to put on and taking off regular upper body clothing?: A Little Help from another person to  put on and taking off regular lower body clothing?: A Lot 6 Click Score: 15   End of Session Nurse Communication: Mobility status;Precautions;Weight bearing status  Activity Tolerance: Patient tolerated treatment well Patient left: in bed;with call bell/phone within reach;with family/visitor present  OT Visit Diagnosis: Unsteadiness on feet (R26.81);Muscle weakness (generalized) (M62.81)                Time: 1610-9604 OT Time Calculation (min): 19 min Charges:  OT General Charges $OT Visit: 1 Visit OT Evaluation $OT Eval Moderate Complexity: 1 Mod   Brynn, OTR/L  Acute Rehabilitation Services Office: [phone] .   Mateo Flow 03/03/2023, 1:53 PM

## 2023-03-04 ENCOUNTER — Encounter (HOSPITAL_COMMUNITY)
Admission: EM | Disposition: A | Discharge status: Skilled Nursing Facility | Payer: Self-pay | Source: Home / Self Care | Attending: Internal Medicine

## 2023-03-04 ENCOUNTER — Inpatient Hospital Stay (HOSPITAL_COMMUNITY): Payer: MEDICAID

## 2023-03-04 ENCOUNTER — Other Ambulatory Visit: Payer: Self-pay

## 2023-03-04 DIAGNOSIS — S8992XA Unspecified injury of left lower leg, initial encounter: Secondary | ICD-10-CM

## 2023-03-04 DIAGNOSIS — M810 Age-related osteoporosis without current pathological fracture: Secondary | ICD-10-CM | POA: Diagnosis not present

## 2023-03-04 DIAGNOSIS — E039 Hypothyroidism, unspecified: Secondary | ICD-10-CM | POA: Diagnosis not present

## 2023-03-04 DIAGNOSIS — S82892A Other fracture of left lower leg, initial encounter for closed fracture: Secondary | ICD-10-CM

## 2023-03-04 HISTORY — PX: ORIF ANKLE FRACTURE: SHX5408

## 2023-03-04 LAB — BASIC METABOLIC PANEL
Anion gap: 11 (ref 5–15)
BUN: 21 mg/dL — ABNORMAL HIGH (ref 6–20)
CO2: 28 mmol/L (ref 22–32)
Calcium: 8.7 mg/dL — ABNORMAL LOW (ref 8.9–10.3)
Chloride: 99 mmol/L (ref 98–111)
Creatinine, Ser: 1.32 mg/dL — ABNORMAL HIGH (ref 0.44–1.00)
GFR, Estimated: 47 mL/min — ABNORMAL LOW (ref >60)
Glucose, Bld: 159 mg/dL — ABNORMAL HIGH (ref 70–99)
Potassium: 4.3 mmol/L (ref 3.5–5.1)
Sodium: 138 mmol/L (ref 135–145)

## 2023-03-04 LAB — CBC WITH DIFFERENTIAL/PLATELET
Abs Immature Granulocytes: 0.04 10*3/uL (ref 0.00–0.07)
Basophils Absolute: 0 10*3/uL (ref 0.0–0.1)
Basophils Relative: 0 %
Eosinophils Absolute: 0 10*3/uL (ref 0.0–0.5)
Eosinophils Relative: 0 %
HCT: 30.2 % — ABNORMAL LOW (ref 36.0–46.0)
Hemoglobin: 10.2 g/dL — ABNORMAL LOW (ref 12.0–15.0)
Immature Granulocytes: 1 %
Lymphocytes Relative: 35 %
Lymphs Abs: 2.7 10*3/uL (ref 0.7–4.0)
MCH: 33.7 pg (ref 26.0–34.0)
MCHC: 33.8 g/dL (ref 30.0–36.0)
MCV: 99.7 fL (ref 80.0–100.0)
Monocytes Absolute: 1 10*3/uL (ref 0.1–1.0)
Monocytes Relative: 13 %
Neutro Abs: 3.8 10*3/uL (ref 1.7–7.7)
Neutrophils Relative %: 51 %
Platelets: 39 10*3/uL — ABNORMAL LOW (ref 150–400)
RBC: 3.03 MIL/uL — ABNORMAL LOW (ref 3.87–5.11)
RDW: 13.4 % (ref 11.5–15.5)
WBC: 7.6 10*3/uL (ref 4.0–10.5)
nRBC: 0 % (ref 0.0–0.2)

## 2023-03-04 LAB — IMMATURE PLATELET FRACTION: Immature Platelet Fraction: 6.3 % (ref 1.2–8.6)

## 2023-03-04 LAB — CBC
HCT: 30.8 % — ABNORMAL LOW (ref 36.0–46.0)
Hemoglobin: 10.3 g/dL — ABNORMAL LOW (ref 12.0–15.0)
MCH: 33.4 pg (ref 26.0–34.0)
MCHC: 33.4 g/dL (ref 30.0–36.0)
MCV: 100 fL (ref 80.0–100.0)
Platelets: 38 10*3/uL — ABNORMAL LOW (ref 150–400)
RBC: 3.08 MIL/uL — ABNORMAL LOW (ref 3.87–5.11)
RDW: 13.3 % (ref 11.5–15.5)
WBC: 7.4 10*3/uL (ref 4.0–10.5)
nRBC: 0 % (ref 0.0–0.2)

## 2023-03-04 LAB — TECHNOLOGIST SMEAR REVIEW

## 2023-03-04 LAB — MRSA NEXT GEN BY PCR, NASAL: MRSA by PCR Next Gen: NOT DETECTED

## 2023-03-04 LAB — ABO/RH: ABO/RH(D): O POS

## 2023-03-04 LAB — HEPATITIS C ANTIBODY: HCV Ab: NONREACTIVE

## 2023-03-04 SURGERY — OPEN REDUCTION INTERNAL FIXATION (ORIF) ANKLE FRACTURE
Anesthesia: Monitor Anesthesia Care | Site: Ankle | Laterality: Left

## 2023-03-04 MED ORDER — CEFAZOLIN SODIUM-DEXTROSE 2-4 GM/100ML-% IV SOLN
INTRAVENOUS | Status: AC
  Filled 2023-03-04: qty 100

## 2023-03-04 MED ORDER — FENTANYL CITRATE (PF) 250 MCG/5ML IJ SOLN
INTRAMUSCULAR | Status: AC
  Filled 2023-03-04: qty 5

## 2023-03-04 MED ORDER — MIDAZOLAM HCL 2 MG/2ML IJ SOLN
INTRAMUSCULAR | Status: AC
  Filled 2023-03-04: qty 2

## 2023-03-04 MED ORDER — ACETAMINOPHEN 160 MG/5ML PO SOLN: 1000.0000 mg | Freq: Once | ORAL | Status: DC | PRN

## 2023-03-04 MED ORDER — DEXAMETHASONE SODIUM PHOSPHATE 10 MG/ML IJ SOLN
INTRAMUSCULAR | Status: DC | PRN
  Administered 2023-03-04: 4 mg via INTRAVENOUS

## 2023-03-04 MED ORDER — OXYCODONE HCL 5 MG PO TABS: 5.0000 mg | ORAL_TABLET | Freq: Once | ORAL | Status: DC | PRN

## 2023-03-04 MED ORDER — LACTATED RINGERS IV SOLN
INTRAVENOUS | Status: DC | PRN
Start: 2023-03-04 — End: 2023-03-04

## 2023-03-04 MED ORDER — HYDROMORPHONE HCL 1 MG/ML IJ SOLN
INTRAMUSCULAR | Status: AC
  Filled 2023-03-04: qty 0.5

## 2023-03-04 MED ORDER — ACETAMINOPHEN 10 MG/ML IV SOLN: 1000.0000 mg | Freq: Once | INTRAVENOUS | Status: DC | PRN

## 2023-03-04 MED ORDER — TRANEXAMIC ACID-NACL 1000-0.7 MG/100ML-% IV SOLN
1000.0000 mg | INTRAVENOUS | Status: AC
  Administered 2023-03-04: 1000 mg via INTRAVENOUS
  Filled 2023-03-04 (×2): qty 100

## 2023-03-04 MED ORDER — PROPOFOL 10 MG/ML IV BOLUS
INTRAVENOUS | Status: DC | PRN
  Administered 2023-03-04: 100 mg via INTRAVENOUS

## 2023-03-04 MED ORDER — ROCURONIUM BROMIDE 10 MG/ML (PF) SYRINGE
PREFILLED_SYRINGE | INTRAVENOUS | Status: DC | PRN
  Administered 2023-03-04: 20 mg via INTRAVENOUS
  Administered 2023-03-04: 50 mg via INTRAVENOUS

## 2023-03-04 MED ORDER — PHENYLEPHRINE HCL-NACL 20-0.9 MG/250ML-% IV SOLN
INTRAVENOUS | Status: DC | PRN
  Administered 2023-03-04: 25 ug/min via INTRAVENOUS

## 2023-03-04 MED ORDER — CHLORHEXIDINE GLUCONATE 0.12 % MT SOLN
OROMUCOSAL | Status: AC
  Administered 2023-03-04: 15 mL
  Filled 2023-03-04: qty 15

## 2023-03-04 MED ORDER — FENTANYL CITRATE (PF) 250 MCG/5ML IJ SOLN
INTRAMUSCULAR | Status: DC | PRN
  Administered 2023-03-04: 50 ug via INTRAVENOUS

## 2023-03-04 MED ORDER — SUGAMMADEX SODIUM 200 MG/2ML IV SOLN
INTRAVENOUS | Status: DC | PRN
  Administered 2023-03-04: 200 mg via INTRAVENOUS

## 2023-03-04 MED ORDER — CEFAZOLIN SODIUM-DEXTROSE 2-4 GM/100ML-% IV SOLN
2.0000 g | Freq: Three times a day (TID) | INTRAVENOUS | Status: AC
  Administered 2023-03-04 – 2023-03-05 (×3): 2 g via INTRAVENOUS
  Filled 2023-03-04 (×3): qty 100

## 2023-03-04 MED ORDER — ONDANSETRON HCL 4 MG/2ML IJ SOLN
INTRAMUSCULAR | Status: DC | PRN
  Administered 2023-03-04: 4 mg via INTRAVENOUS

## 2023-03-04 MED ORDER — ACETAMINOPHEN 500 MG PO TABS: 1000.0000 mg | ORAL_TABLET | Freq: Once | ORAL | Status: DC | PRN

## 2023-03-04 MED ORDER — POVIDONE-IODINE 10 % EX SWAB
2.0000 | Freq: Once | CUTANEOUS | Status: AC
  Administered 2023-03-04: 2 via TOPICAL

## 2023-03-04 MED ORDER — CHLORHEXIDINE GLUCONATE 4 % EX SOLN
60.0000 mL | Freq: Once | CUTANEOUS | Status: DC
  Administered 2023-03-04: 4 via TOPICAL
  Filled 2023-03-04: qty 60

## 2023-03-04 MED ORDER — PROPOFOL 10 MG/ML IV BOLUS
INTRAVENOUS | Status: AC
  Filled 2023-03-04: qty 20

## 2023-03-04 MED ORDER — 0.9 % SODIUM CHLORIDE (POUR BTL) OPTIME
TOPICAL | Status: DC | PRN
  Administered 2023-03-04: 1000 mL

## 2023-03-04 MED ORDER — OXYCODONE HCL 5 MG/5ML PO SOLN: 5.0000 mg | Freq: Once | ORAL | Status: DC | PRN

## 2023-03-04 MED ORDER — EPHEDRINE SULFATE (PRESSORS) 50 MG/ML IJ SOLN
INTRAMUSCULAR | Status: DC | PRN
Start: 2023-03-04 — End: 2023-03-04
  Administered 2023-03-04: 10 mg via INTRAVENOUS
  Administered 2023-03-04: 15 mg via INTRAVENOUS

## 2023-03-04 MED ORDER — FENTANYL CITRATE (PF) 100 MCG/2ML IJ SOLN: 25.0000 ug | INTRAMUSCULAR | Status: DC | PRN

## 2023-03-04 MED ORDER — CEFAZOLIN SODIUM-DEXTROSE 2-4 GM/100ML-% IV SOLN
2.0000 g | INTRAVENOUS | Status: AC
  Administered 2023-03-04: 2 g via INTRAVENOUS
  Filled 2023-03-04: qty 100

## 2023-03-04 SURGICAL SUPPLY — 79 items
BAG COUNTER SPONGE SURGICOUNT (BAG) ×2 IMPLANT
BAG SPNG CNTER NS LX DISP (BAG) ×1
BANDAGE ESMARK 6X9 LF (GAUZE/BANDAGES/DRESSINGS) ×2 IMPLANT
BIT DRILL 2.4 AO COUPLING CANN (BIT) IMPLANT
BIT DRILL SHORT 2.0 ZI (BIT) IMPLANT
BIT DRILL SHORT 2.5 (BIT) IMPLANT
BIT DRL SHORT 2.5 (BIT) ×1
BNDG CMPR 5X4 KNIT ELC UNQ LF (GAUZE/BANDAGES/DRESSINGS) ×1
BNDG CMPR 6 X 5 YARDS HK CLSR (GAUZE/BANDAGES/DRESSINGS) ×1
BNDG CMPR 9X6 STRL LF SNTH (GAUZE/BANDAGES/DRESSINGS) ×1
BNDG ELASTIC 4INX 5YD STR LF (GAUZE/BANDAGES/DRESSINGS) IMPLANT
BNDG ELASTIC 4X5.8 VLCR STR LF (GAUZE/BANDAGES/DRESSINGS) IMPLANT
BNDG ELASTIC 6INX 5YD STR LF (GAUZE/BANDAGES/DRESSINGS) IMPLANT
BNDG ELASTIC 6X5.8 VLCR STR LF (GAUZE/BANDAGES/DRESSINGS) IMPLANT
BNDG ESMARK 6X9 LF (GAUZE/BANDAGES/DRESSINGS) ×1
BNDG GAUZE DERMACEA FLUFF 4 (GAUZE/BANDAGES/DRESSINGS) ×4 IMPLANT
BNDG GZE DERMACEA 4 6PLY (GAUZE/BANDAGES/DRESSINGS) ×1
BRUSH SCRUB EZ PLAIN DRY (MISCELLANEOUS) ×4 IMPLANT
COVER MAYO STAND STRL (DRAPES) ×2 IMPLANT
COVER SURGICAL LIGHT HANDLE (MISCELLANEOUS) ×2 IMPLANT
CUFF TOURN SGL QUICK 34 (TOURNIQUET CUFF) ×1
CUFF TRNQT CYL 34X4.125X (TOURNIQUET CUFF) ×2 IMPLANT
DRAPE C-ARM 42X72 X-RAY (DRAPES) ×2 IMPLANT
DRAPE C-ARMOR (DRAPES) ×2 IMPLANT
DRAPE HALF SHEET 40X57 (DRAPES) ×2 IMPLANT
DRAPE U-SHAPE 47X51 STRL (DRAPES) ×2 IMPLANT
DRSG EMULSION OIL 3X3 NADH (GAUZE/BANDAGES/DRESSINGS) IMPLANT
DRSG MEPITEL 4X7.2 (GAUZE/BANDAGES/DRESSINGS) IMPLANT
ELECT REM PT RETURN 9FT ADLT (ELECTROSURGICAL) ×1
ELECTRODE REM PT RTRN 9FT ADLT (ELECTROSURGICAL) ×2 IMPLANT
GAUZE PAD ABD 8X10 STRL (GAUZE/BANDAGES/DRESSINGS) IMPLANT
GAUZE SPONGE 4X4 12PLY STRL (GAUZE/BANDAGES/DRESSINGS) ×4 IMPLANT
GLOVE BIO SURGEON STRL SZ7.5 (GLOVE) ×2 IMPLANT
GLOVE BIO SURGEON STRL SZ8 (GLOVE) ×2 IMPLANT
GLOVE BIOGEL PI IND STRL 7.5 (GLOVE) ×2 IMPLANT
GLOVE BIOGEL PI IND STRL 8 (GLOVE) ×2 IMPLANT
GLOVE SURG ORTHO LTX SZ7.5 (GLOVE) ×4 IMPLANT
GOWN STRL REUS W/ TWL LRG LVL3 (GOWN DISPOSABLE) ×4 IMPLANT
GOWN STRL REUS W/ TWL XL LVL3 (GOWN DISPOSABLE) ×2 IMPLANT
GOWN STRL REUS W/TWL LRG LVL3 (GOWN DISPOSABLE) ×2
GOWN STRL REUS W/TWL XL LVL3 (GOWN DISPOSABLE) ×1
K-WIRE ALPS MXV 1.6X6 ZI (WIRE) ×2
K-WIRE TROC 1.25X150 (WIRE) ×1
KIT BASIN OR (CUSTOM PROCEDURE TRAY) ×2 IMPLANT
KIT TURNOVER KIT B (KITS) ×2 IMPLANT
KWIRE ALPS MXV 1.6X6 ZI (WIRE) IMPLANT
KWIRE TROC 1.25X150 (WIRE) IMPLANT
MANIFOLD NEPTUNE II (INSTRUMENTS) ×2 IMPLANT
NDL HYPO 21X1.5 SAFETY (NEEDLE) IMPLANT
NEEDLE HYPO 21X1.5 SAFETY (NEEDLE) IMPLANT
NS IRRIG 1000ML POUR BTL (IV SOLUTION) ×2 IMPLANT
PACK GENERAL/GYN (CUSTOM PROCEDURE TRAY) ×2 IMPLANT
PACK ORTHO EXTREMITY (CUSTOM PROCEDURE TRAY) ×2 IMPLANT
PAD ARMBOARD 7.5X6 YLW CONV (MISCELLANEOUS) ×4 IMPLANT
PAD CAST 4YDX4 CTTN HI CHSV (CAST SUPPLIES) IMPLANT
PADDING CAST COTTON 4X4 STRL (CAST SUPPLIES) ×1
PADDING CAST COTTON 6X4 STRL (CAST SUPPLIES) IMPLANT
PLATE LAT FIB 6H LT (Plate) IMPLANT
SCREW CANN 4.0X60MM PART THRD (Screw) IMPLANT
SCREW CANN PT 4X36 NS (Screw) IMPLANT
SCREW CANNULATED NS 4MMX36MM (Screw) ×1 IMPLANT
SCREW LOCK 3.5X10 (Screw) IMPLANT
SCREW LOCK MDS 2.710 (Screw) IMPLANT
SCREW LOCK MDS 2.7X14 (Screw) IMPLANT
SCREW NLOCK 2.7X14 (Screw) IMPLANT
SCREW NON-LOCK 3.5X10 (Screw) IMPLANT
SPLINT PLASTER CAST FAST 5X30 (CAST SUPPLIES) IMPLANT
SPONGE T-LAP 18X18 ~~LOC~~+RFID (SPONGE) ×2 IMPLANT
STAPLER VISISTAT 35W (STAPLE) IMPLANT
SUCTION TUBE FRAZIER 10FR DISP (SUCTIONS) ×2 IMPLANT
SUT ETHILON 2 0 FS 18 (SUTURE) ×4 IMPLANT
SUT PDS AB 2-0 CT1 27 (SUTURE) IMPLANT
SUT VIC AB 2-0 CT1 27 (SUTURE) ×2
SUT VIC AB 2-0 CT1 TAPERPNT 27 (SUTURE) ×4 IMPLANT
TOWEL GREEN STERILE (TOWEL DISPOSABLE) ×4 IMPLANT
TOWEL GREEN STERILE FF (TOWEL DISPOSABLE) ×2 IMPLANT
TUBE CONNECTING 12X1/4 (SUCTIONS) ×2 IMPLANT
UNDERPAD 30X36 HEAVY ABSORB (UNDERPADS AND DIAPERS) ×2 IMPLANT
WATER STERILE IRR 1000ML POUR (IV SOLUTION) ×2 IMPLANT

## 2023-03-04 NOTE — Anesthesia Preprocedure Evaluation (Addendum)
Anesthesia Evaluation  Patient identified by MRN, date of birth, ID band Patient awake    Reviewed: Allergy & Precautions, NPO status , Patient's Chart, lab work & pertinent test results, reviewed documented beta blocker date and time   History of Anesthesia Complications (+) history of anesthetic complications  Airway Mallampati: III  TM Distance: >3 FB Neck ROM: Full    Dental  (+) Dental Advisory Given   Pulmonary neg shortness of breath, neg sleep apnea, neg COPD, neg recent URI, former smoker   breath sounds clear to auscultation       Cardiovascular negative cardio ROS  Rhythm:Regular     Neuro/Psych Seizures -,  PSYCHIATRIC DISORDERS   Bipolar Disorder Schizophrenia     GI/Hepatic negative GI ROS, Neg liver ROS,,,  Endo/Other  Hypothyroidism    Renal/GU CRFRenal diseaseLab Results      Component                Value               Date                      NA                       138                 03/04/2023                K                        4.3                 03/04/2023                CO2                      28                  03/04/2023                GLUCOSE                  159 (H)             03/04/2023                BUN                      21 (H)              03/04/2023                CREATININE               1.32 (H)            03/04/2023                CALCIUM                  8.7 (L)             03/04/2023                GFRNONAA                 47 (L)              03/04/2023  Musculoskeletal   Abdominal   Peds  Hematology  (+) Blood dyscrasia, anemia Lab Results      Component                Value               Date                      WBC                      7.4                 03/04/2023                HGB                      10.3 (L)            03/04/2023                HCT                      30.8 (L)            03/04/2023                MCV                       100.0               03/04/2023                PLT                      38 (L)              03/04/2023              Anesthesia Other Findings   Reproductive/Obstetrics                              Anesthesia Physical Anesthesia Plan  ASA: 3  Anesthesia Plan: General   Post-op Pain Management: Ofirmev IV (intra-op)*   Induction: Intravenous  PONV Risk Score and Plan: 3 and Ondansetron, Dexamethasone and Propofol infusion  Airway Management Planned: LMA and Oral ETT  Additional Equipment: None  Intra-op Plan:   Post-operative Plan: Extubation in OR  Informed Consent: I have reviewed the patients History and Physical, chart, labs and discussed the procedure including the risks, benefits and alternatives for the proposed anesthesia with the patient or authorized representative who has indicated his/her understanding and acceptance.     Dental advisory given  Plan Discussed with: CRNA  Anesthesia Plan Comments:          Anesthesia Quick Evaluation

## 2023-03-04 NOTE — Progress Notes (Addendum)
Summary: Diamond Bowman. Diamond Bowman is a 57 y.o. F with PMH bipolar 2 disorder, schizophrenia, intellectual disability, osteoporosis, CKD stage 3, heart murmur, hypothyroidism, osteoporosis, seizures who presents after a mechanical fall at home 10/08 resulting in ankle pain limiting mobility.   Subjective:  Seen after her procedure. Denying any pain or acute concerns. Was asking for ice cream.   Objective:  Vital signs in last 24 hours: afebrile, hypertensive, satting well on RA.  Vitals:   03/03/23 1636 03/03/23 1916 03/04/23 0602 03/04/23 0734  BP: (!) 140/110 (!) 140/65 (!) 145/68 (!) 137/124  Pulse: 72 73 95 94  Resp:  16 16 16   Temp: 98.3 F (36.8 C) 99 F (37.2 C) 98.8 F (37.1 C) 98 F (36.7 C)  TempSrc:  Oral Oral Oral  SpO2: 100% 98% 100% 100%  Weight:      Height:          Latest Ref Rng & Units 03/04/2023    5:02 AM 03/04/2023    5:01 AM 03/03/2023    1:10 PM  CBC  WBC 4.0 - 10.5 K/uL 7.4  7.6  5.9   Hemoglobin 12.0 - 15.0 g/dL 40.9  81.1  91.4   Hematocrit 36.0 - 46.0 % 30.8  30.2  36.1   Platelets 150 - 400 K/uL 38  39  41        Latest Ref Rng & Units 03/04/2023    5:02 AM 03/03/2023    1:10 PM 11/14/2021    1:39 AM  CMP  Glucose 70 - 99 mg/dL 782  72  88   BUN 6 - 20 mg/dL 21  22  21    Creatinine 0.44 - 1.00 mg/dL 9.56  2.13  0.86   Sodium 135 - 145 mmol/L 138  143  138   Potassium 3.5 - 5.1 mmol/L 4.3  4.8  5.3   Chloride 98 - 111 mmol/L 99  104  100   CO2 22 - 32 mmol/L 28  31  30    Calcium 8.9 - 10.3 mg/dL 8.7  8.8  8.2   Total Protein 6.5 - 8.1 g/dL  5.8    Total Bilirubin 0.3 - 1.2 mg/dL  0.7    Alkaline Phos 38 - 126 U/L  39    AST 15 - 41 U/L  28    ALT 0 - 44 U/L  14       Physical Exam Constitutional: Patient is resting, somnolent but awakens to voice CV: Regular rate and rhythm without murmurs on auscultation. No LE edema.  Pulmonary/Respiratory: no adventitious sounds heard. Decreased movements at bases Neuro: alert but somnolent MSK: Left  foot in brace Skin: no lesions on skin Psych: normal mood  Assessment/Plan:  Principal Problem:   Left trimalleolar fracture, closed, initial encounter  L probable trimalleolar fracture, closed Osteoporosis Pt is status post ORIF of L ankle today. She was seen after procedure and appears to be doing well. Per recommendations, she needs to be non-weight bearing for next 3-5 days. Plan for OP follow up for repeat imaging in 10 days and for splint to remain in place for at least 4 weeks. Priority in the coming days will be good pain control. She has prn medications ordered.  Plan: -TOC/SW, PT, OT consulted  -Pain management: tylenol 650 mg q6h PRN mild pain, oxycodone 5 mg q4h PRN moderate pain, dilaudid 0.5-1 mg q2h PRN severe pain -Bowel regimen: senna 8.6 mg BID -F/u orthopedic surgery recommendations -Continue home alendronate 70  mg weekly on Thursday's, calcium carbonate-cholecalciferol 500-15 mg-mcg daily, MV one tablet daily   Elevated BP No hx of HTN. Appears to be 2/2 to problem 1. Plan to monitor for now. Does have prns on board.   Intellectual disability Bipolar 2 disorder Schizophrenia Insomnia Chronic. Will continue home regimen here. Plan: -Continue home olanzapine 15 mg daily at bedtime, trazodone 300 mg daily at bedtime, mirtazapine 15 mg daily at bedtime, cyproheptadine 4 mg daily at bedtime, hydroxyzine 50 mg BID   Seizure disorder Chronic. -Continue home depakote 1500 mg daily at bedtime   Hypothyroidism Chronic. TSH last checked in 2023 was normal.  Plan: -Continue home levothyroxine 75 mcg daily   Hx atrial fibrillation on aspirin Chronic. Continues to be in NSR. Per cardiology note 11/2021, felt to be isolated episode precipitated by severe sepsis in June 2023. Shared decision making to stop eliquis and transition to ASA per cardiology July 2023. In regular rate and rhythm on my exam. Plan: -Continue home metoprolol 50 mg daily   Hx iron deficiency  anemia Chronic. CBC shows stable anemia. Will trend after surgery and get iron studies to replete any iron deficiency.  -Trend CBC, iron studies tomorrow -Continue home ferrous sulfate  Thrombocytopenia Appears to be chronic in nature. She has received work up for this by hematology and ITP vs platelet dysfunction was being considered. Last note by them recommended continued surveillance. Currently it is stable. She may need to re-establish with them OP to continue work up. Depakote is known to cause thrombocytopenia and pt is on this medication. Will continue to look into this problem and follow up the ordered labs.   Prior to Admission Living Arrangement: Home Anticipated Discharge Location: SNF Barriers to Discharge: Medical stability Dispo: Anticipated discharge in approximately 1-2 day(s).   [name], MD [name]. [facility] Internal Medicine Residency, PGY-3  After 5pm on weekdays and 1pm on weekends: On Call pager [phone]

## 2023-03-04 NOTE — Op Note (Signed)
03/04/2023  1:55 PM  PATIENT:  Diamond Bowman  [birth year] female   MEDICAL RECORD NUMBER: [id number]  PRE-OPERATIVE DIAGNOSIS:  LEFT ANKLE FRACTURE  POST-OPERATIVE DIAGNOSIS:  LEFT ANKLE FRACTURE  PROCEDURE:   OPEN REDUCTION INTERNAL FIXATION OF LEFT TRIMALLEOLAR ANKLE FRACTURE WITH FIXATION OF THE POSTERIOR LIP MANUAL APPLICATION OF STRESS ANKLE SYNDESMOSIS UNDER FLUOROSCOPY  SURGEON:  Doralee Albino. Carola Frost, M.D.  ASSISTANT:  Montez Morita, PA-C.  ANESTHESIA:  General.  COMPLICATIONS:  None.  TOURNIQUET: None.  ESTIMATED BLOOD LOSS:  50 mL.  DISPOSITION:  To PACU.  CONDITION:  Stable.  DELAY START OF DVT PROPHYLAXIS BECAUSE OF BLEEDING RISK: NO   BRIEF SUMMARY AND INDICATIONS FOR PROCEDURE:  The patient is a 57 y.o. who sustained a fracture of the left ankle, treated with reduction at the time of presentation to the Emergency Department. We discussed with the patient's mother the risks and benefits of surgery including the possibility of infection, DVT, PE, nerve injury, vessel injury, loss of motion, arthritis, symptomatic hardware, heart attack, stroke and need for further surgery, among others. After acknowledging these risks, consent was provided to proceed.  SUMMARY OF PROCEDURE:  The patient was taken to the operating room after administration of a regional block and preoperative antibiotics.  The left lower extremity was prepped and draped in the usual sterile fashion.  A tourniquet was placed about the thigh but never inflated during the procedure.  A timeout was held, and then an incision was made directly over the lateral malleolus with careful dissection to avoid injury to the superficial peroneal nerve.  The periosteum was left intact as we continued deep dissection.  The fracture site was identified and curettage and lavage used to remove hematoma.  With the assistance of distal manipulation and placement of tenaculums, we were able to obtain an anatomic reduction with   interdigitation of the primary fracture fragments which was during application of a lateral malleolus plate.  We used the Zimmer 2.0 system and placed standard fixation in the shaft and distal lateral malleolus, confirming plate position with x-ray and then continuing with a standard fixation as well as a locked fixation.  Final images showed appropriate reductional replacement, trajectory, and length. Bone quality was noted to be fair to poor.  Next, attention was turned to the medial side.  Here, pins were used to secure reduction and then use cannulated drill to place a long cortical screw.  Excellent fixation with compression was obtained.  The C-arm was then brought back in and AP, lateral and mortise views showed restoration of ankle alignment reduction.    The posterior malleolar fracture fragment was then fixed because it was an attachment site of the syndesmotic ligaments, and additional fixation was deemed advisable given the patient's mental capacity which increases the chance of noncompliance with weight bearing restrictions. The guide pin for a cannulated screw was placed using fluoroscopic guidance from anteromedial to posterolateral in the subchondral bone, overdrilling the near side. This produced compression, and the screw appeared to be of appropriate length and trajectory on multiple images.   I then performed an external rotation stress view of the ankle under live fluoroscopy.  No syndesmotic widening nor widening of the medial clear space was identified to suggest a syndesmotic injury.  Wounds were irrigated once more and then closed in standard layered fashion using 2-0 Vicryl and 3-0 nylon.  A sterile gently compressive dressing was applied, and then a posterior and stirrup splint with the ankle extended just above  neutral.  PROGNOSIS: The patient will be nonweightbearing in the splint with ice and elevation over the next 3 to 5 days.  We will plan to see her back in the office in  10 days for x-rays. As long as the splint remains well fitting we will try to leave it in place for 4 weeks. Alternatively we will switch her into a cast when appropriate. Removal of sutures and transition to a Cam boot with unrestricted range of motion of the ankle at that time.  Weightbearing at 6 weeks.

## 2023-03-04 NOTE — Anesthesia Procedure Notes (Signed)
Procedure Name: Intubation Date/Time: 03/04/2023 11:45 AM  Performed by: Sandie Ano, CRNAPre-anesthesia Checklist: Patient identified, Emergency Drugs available, Suction available and Patient being monitored Patient Re-evaluated:Patient Re-evaluated prior to induction Oxygen Delivery Method: Circle System Utilized Preoxygenation: Pre-oxygenation with 100% oxygen Induction Type: IV induction Ventilation: Mask ventilation without difficulty Laryngoscope Size: Mac and 3 Grade View: Grade II Tube type: Oral Tube size: 7.0 mm Number of attempts: 1 Airway Equipment and Method: Stylet and Oral airway Placement Confirmation: ETT inserted through vocal cords under direct vision, positive ETCO2 and breath sounds checked- equal and bilateral Secured at: 23 cm Tube secured with: Tape Dental Injury: Teeth and Oropharynx as per pre-operative assessment

## 2023-03-04 NOTE — Progress Notes (Addendum)
I have seen and examined the patient, I agree with the findings below by Montez Morita, PA-C, and I have directed the plan for treatment as noted.  The risks and benefits of surgery for left ankle repair were discussed, including the possibility of infection, nerve injury, vessel injury, wound breakdown, arthritis, symptomatic hardware, DVT/ PE, loss of motion, malunion, nonunion, and need for further surgery among others.  These risks were acknowledged and consent provided to proceed.  Diamond Palmer, MD 03/04/2023 10:58 AM      [facility]  I spoke with pts mother and legal guardian, Diamond Bowman.  I explained why surgery is necessary as well as risks and benefits.  Consent to be confirmed by floor RN  We discussed that ideally the pt should be non-weightbearing for 6 weeks but she does not think the pt can be compliant with that.  As such we will probably put the patient in a short leg cast post op depending on her swelling.  Alternatively she could be put in a long leg cast but I do not think this will be tolerated at all   Pre-mature and excessive weightbearing could lead to fixation failure and need for re-operation   Of note patients platelets this am are 38,000.  Do not anticipate significant blood loss. Do not feel this will delay surgery   Diamond Latin, PA-C [phone] (C) 03/04/2023, 9:10 AM  [facility] [address] [place] [address] [phone] (F)       Patient ID: Diamond Bowman, female   DOB: [birth year], 57 y.o.   MRN: [id number]

## 2023-03-04 NOTE — TOC CAGE-AID Note (Signed)
Transition of Care [facility]) - CAGE-AID Screening   Patient Details  Name: Diamond Bowman MRN: [id number] Date of Birth: 1965/11/09  Transition of Care [facility]) CM/SW Contact:    Katha Hamming, RN Phone Number: 03/04/2023, 10:36 PM    CAGE-AID Screening: Substance Abuse Screening unable to be completed due to: : Patient unable to participate

## 2023-03-04 NOTE — Progress Notes (Signed)
Contacted MD re: platelets. Hospitalist states surgery team to address.

## 2023-03-04 NOTE — Progress Notes (Signed)
Surgical consent obtained via telephone from mother, Aura Dials. Pt with cognitive impairment. Two nurse witnessed.

## 2023-03-04 NOTE — Progress Notes (Signed)
CCC Pre-op Review  Pre-op checklist:  incomplete  NPO:  yes  Labs:   10/10 @ 0502  H/H  10.3 / 30.8  PLTS 38 * --> spoke with Ilene Qua RN who is to f/u with IM provider [phone]  Consent:  not done  H&P:  only by IM admit  Vitals:   @0602  98.8 p95 r16 145/68 (89) 100% ra  O2 requirements:   ra  MAR/PTA review:   IV:   22g L AC  Floor nurse name:   Fransisca Connors RN  Additional info:   HAS [facility] DO -> depakote given at HS  LOVENOX  appears to have been held

## 2023-03-04 NOTE — Transfer of Care (Signed)
Immediate Anesthesia Transfer of Care Note  Patient: Diamond Bowman  Procedure(s) Performed: OPEN REDUCTION INTERNAL FIXATION (ORIF) ANKLE FRACTURE (Left: Ankle)  Patient Location: PACU  Anesthesia Type:General  Level of Consciousness: awake  Airway & Oxygen Therapy: Patient Spontanous Breathing  Post-op Assessment: Report given to RN and Post -op Vital signs reviewed and stable  Post vital signs: Reviewed and stable  Last Vitals:  Vitals Value Taken Time  BP 155/74 03/04/23 1337  Temp    Pulse 90 03/04/23 1337  Resp 12 03/04/23 1339  SpO2 97 % 03/04/23 1337  Vitals shown include unfiled device data.  Last Pain:  Vitals:   03/04/23 0734  TempSrc: Oral  PainSc:          Complications: No notable events documented.

## 2023-03-05 DIAGNOSIS — S82852A Displaced trimalleolar fracture of left lower leg, initial encounter for closed fracture: Secondary | ICD-10-CM

## 2023-03-05 LAB — BASIC METABOLIC PANEL
Anion gap: 13 (ref 5–15)
BUN: 12 mg/dL (ref 6–20)
CO2: 31 mmol/L (ref 22–32)
Calcium: 8.5 mg/dL — ABNORMAL LOW (ref 8.9–10.3)
Chloride: 100 mmol/L (ref 98–111)
Creatinine, Ser: 1.19 mg/dL — ABNORMAL HIGH (ref 0.44–1.00)
GFR, Estimated: 53 mL/min — ABNORMAL LOW (ref >60)
Glucose, Bld: 138 mg/dL — ABNORMAL HIGH (ref 70–99)
Potassium: 4 mmol/L (ref 3.5–5.1)
Sodium: 144 mmol/L (ref 135–145)

## 2023-03-05 LAB — FERRITIN: Ferritin: 269 ng/mL (ref 11–307)

## 2023-03-05 LAB — CBC
HCT: 27.4 % — ABNORMAL LOW (ref 36.0–46.0)
Hemoglobin: 9.3 g/dL — ABNORMAL LOW (ref 12.0–15.0)
MCH: 34.7 pg — ABNORMAL HIGH (ref 26.0–34.0)
MCHC: 33.9 g/dL (ref 30.0–36.0)
MCV: 102.2 fL — ABNORMAL HIGH (ref 80.0–100.0)
Platelets: 75 10*3/uL — ABNORMAL LOW (ref 150–400)
RBC: 2.68 MIL/uL — ABNORMAL LOW (ref 3.87–5.11)
RDW: 13.9 % (ref 11.5–15.5)
WBC: 12.1 10*3/uL — ABNORMAL HIGH (ref 4.0–10.5)
nRBC: 0 % (ref 0.0–0.2)

## 2023-03-05 LAB — BPAM PLATELET PHERESIS
Blood Product Expiration Date: 202410112359
ISSUE DATE / TIME: 202410101149
Unit Type and Rh: 6200

## 2023-03-05 LAB — PREPARE PLATELET PHERESIS: Unit division: 0

## 2023-03-05 LAB — IRON AND TIBC
Iron: 17 ug/dL — ABNORMAL LOW (ref 28–170)
Saturation Ratios: 7 % — ABNORMAL LOW (ref 10.4–31.8)
TIBC: 230 ug/dL — ABNORMAL LOW (ref 250–450)
UIBC: 213 ug/dL

## 2023-03-05 MED ORDER — ENSURE ENLIVE PO LIQD
237.0000 mL | Freq: Two times a day (BID) | ORAL | Status: DC
  Administered 2023-03-06 – 2023-03-17 (×14): 237 mL via ORAL

## 2023-03-05 NOTE — Plan of Care (Signed)
  Problem: Clinical Measurements: Goal: Will remain free from infection Outcome: Progressing Goal: Diagnostic test results will improve Outcome: Progressing Goal: Respiratory complications will improve Outcome: Progressing Goal: Cardiovascular complication will be avoided Outcome: Progressing   Problem: Activity: Goal: Risk for activity intolerance will decrease Outcome: Progressing   Problem: Nutrition: Goal: Adequate nutrition will be maintained Outcome: Progressing   Problem: Elimination: Goal: Will not experience complications related to bowel motility Outcome: Progressing Goal: Will not experience complications related to urinary retention Outcome: Progressing   Problem: Pain Managment: Goal: General experience of comfort will improve Outcome: Progressing   Problem: Safety: Goal: Ability to remain free from injury will improve Outcome: Progressing

## 2023-03-05 NOTE — Evaluation (Signed)
Occupational Therapy RE-Evaluation Patient Details Name: Diamond Bowman MRN: [id number] DOB: 1965/08/25 Today's Date: 03/05/2023   History of Present Illness Pt is 57 year old presented to [facility] hospital on  03/03/23 for fall with left leg pain, found to have L ankle trimalleolar fx. Pt underwent ORIF on 10/10. PMH - MDD, BiPolar, Schizophrenia, Mental Retardation, tardive dyskinesia, CKD 3, hypothyroidism   Clinical Impression   Pt now resistive to mobility to EOB and OOB, seems fearful of falling. Pt needs increased time and encouragement to perform tasks independently but can complete bed level grooming with setup and cues for thoroughness. She presented with strong tremors today making it harder for her to self feed. Contacted family/PCA to discuss TOC but the PCA did not seem like a lift would work if Dc'ing home. OT to continue to progress pt as able. Patient would benefit from post acute skilled rehab facility with <3 hours of therapy and 24/7 support       If plan is discharge home, recommend the following: Two people to help with walking and/or transfers;Two people to help with bathing/dressing/bathroom;Direct supervision/assist for financial management;Supervision due to cognitive status;Direct supervision/assist for medications management    Functional Status Assessment  Patient has had a recent decline in their functional status and demonstrates the ability to make significant improvements in function in a reasonable and predictable amount of time.  Equipment Recommendations  Wheelchair (measurements OT);Wheelchair cushion (measurements OT);Other (comment);Hoyer lift;BSC/3in1 (Drop arm commode for w/c accessibility)    Recommendations for Other Services       Precautions / Restrictions Precautions Precautions: Fall Required Braces or Orthoses: Splint/Cast Splint/Cast - Date Prophylactic Dressing Applied (if applicable): 03/04/23 Restrictions Weight Bearing Restrictions:  Yes LLE Weight Bearing: Non weight bearing      Mobility Bed Mobility               General bed mobility comments: Resistant to bed mobilty; fearful    Transfers                   General transfer comment: declined      Balance                                           ADL either performed or assessed with clinical judgement   ADL   Eating/Feeding: Set up;Bed level Eating/Feeding Details (indicate cue type and reason): Needs straws as her tremors make her spill her drinks Grooming: Bed level;Set up Grooming Details (indicate cue type and reason): needs encouragement to initiate independently Upper Body Bathing: Minimal assistance;Bed level                             General ADL Comments: Declined any mobility     Vision         Perception         Praxis         Pertinent Vitals/Pain Pain Assessment Pain Assessment: PAINAD Breathing: normal Negative Vocalization: none Facial Expression: smiling or inexpressive Body Language: tense, distressed pacing, fidgeting Consolability: no need to console PAINAD Score: 1 Pain Intervention(s): Monitored during session     Extremity/Trunk Assessment Upper Extremity Assessment Upper Extremity Assessment: Difficult to assess due to impaired cognition   Lower Extremity Assessment Lower Extremity Assessment: Difficult to assess due to impaired cognition LLE Deficits / Details:  pt moves BLE against gravity, does not consistently follow commands to participate in formal assessment   Cervical / Trunk Assessment Cervical / Trunk Assessment: Kyphotic   Communication Communication Communication: Difficulty communicating thoughts/reduced clarity of speech Cueing Techniques: Verbal cues;Tactile cues   Cognition Arousal: Alert Behavior During Therapy: Anxious Overall Cognitive Status: History of cognitive impairments - at baseline                                  General Comments: refusing mobility to EOB, perseverates on drinks     General Comments  VSS on RA, contacted pt's family and PCA to help determine TOC and DC plans    Exercises     Shoulder Instructions      Home Living Family/patient expects to be discharged to:: Unsure Living Arrangements: Other (Comment) Available Help at Discharge: Personal care attendant Type of Home: House       Home Layout: Two level;Bed/bath upstairs Alternate Level Stairs-Number of Steps: 1 flight Alternate Level Stairs-Rails: Right Bathroom Shower/Tub: Chief Strategy Officer: Standard         Additional Comments: pt is unable to report history      Prior Functioning/Environment Prior Level of Function : Needs assist             Mobility Comments: independent with mobility per OT noted on 10/9 ADLs Comments: aide assists with ADLs/iADLs        OT Problem List: Decreased activity tolerance;Impaired balance (sitting and/or standing);Decreased knowledge of use of DME or AE;Decreased safety awareness;Decreased knowledge of precautions;Pain      OT Treatment/Interventions: Self-care/ADL training;Therapeutic exercise;DME and/or AE instruction;Manual therapy;Modalities;Splinting;Therapeutic activities;Patient/family education;Balance training    OT Goals(Current goals can be found in the care plan section) Acute Rehab OT Goals Patient Stated Goal: Wants coffee OT Goal Formulation: Patient unable to participate in goal setting Time For Goal Achievement: 03/17/23 Potential to Achieve Goals: Good ADL Goals Pt Will Perform Grooming: sitting;with min assist Additional ADL Goal #3: pt will tolerate 5 mins EOB sitting with CGA to promote confidence and in preparation to complete seated ADLs  OT Frequency: Min 1X/week    Co-evaluation              AM-PAC OT "6 Clicks" Daily Activity     Outcome Measure Help from another person eating meals?: A Little Help from another  person taking care of personal grooming?: A Little Help from another person toileting, which includes using toliet, bedpan, or urinal?: A Lot Help from another person bathing (including washing, rinsing, drying)?: A Lot Help from another person to put on and taking off regular upper body clothing?: A Little Help from another person to put on and taking off regular lower body clothing?: A Lot 6 Click Score: 15   End of Session Nurse Communication: Mobility status;Precautions;Weight bearing status (LLE NWB, resists mobility)  Activity Tolerance: Other (comment) (Tx limited by fear) Patient left:    OT Visit Diagnosis: Unsteadiness on feet (R26.81);Muscle weakness (generalized) (M62.81)                Time: 1610-9604 OT Time Calculation (min): 15 min Charges:  OT General Charges $OT Visit: 1 Visit OT Evaluation $OT Eval Moderate Complexity: 1 Mod  03/05/2023  AB, OTR/L  Acute Rehabilitation Services  Office: [phone]   Diamond Bowman 03/05/2023, 9:57 AM

## 2023-03-05 NOTE — [place] FL2 (Addendum)
[place] MEDICAID FL2 LEVEL OF CARE FORM     IDENTIFICATION  Patient Name: Diamond Bowman Birthdate: 1966-01-08 Sex: female Admission Date (Current Location): 03/03/2023  [facility] and [place] Number:  Producer, television/film/video and Address:  [facility], [address], [place], [place] [address]      Provider Number: [id number]  Attending Physician Name and Address:  Dickie La, MD  Relative Name and Phone Number:  Legal Sandi Mealy (Mother)  [phone]    Current Level of Care: Hospital Recommended Level of Care: Skilled Nursing Facility Prior Approval Number:    Date Approved/Denied: Approval: 03/08/2023  Expired: 04/07/2023 PASRR Number: [id number]  Discharge Plan: SNF    Current Diagnoses: Patient Active Problem List   Diagnosis Date Noted   Left trimalleolar fracture, closed, initial encounter 03/03/2023   Community acquired pneumonia    Inclusion cyst 11/11/2021   Hyperkalemia 11/10/2021   Acute maculopapular rash 11/08/2021   Closed nondisplaced fracture of distal phalanx of right index finger 11/05/2021   Atrial fibrillation (HCC) 11/04/2021   Thrombocytopenic disorder (HCC) 05/06/2020   Chronic kidney disease, stage 3 (HCC) 01/04/2015   Schizophrenia (HCC) 12/28/2014   Intellectual disability 05/14/2012   Seizure disorder (HCC) 05/14/2012   Bipolar 2 disorder (HCC) 05/14/2012   Hypothyroidism 05/14/2012   Osteoporosis 05/14/2012    Orientation RESPIRATION BLADDER Height & Weight     Self  Normal Incontinent Weight: 92 lb 9.5 oz (42 kg) Height:  4\' 6"  (137.2 cm)  BEHAVIORAL SYMPTOMS/MOOD NEUROLOGICAL BOWEL NUTRITION STATUS     (seizure disorder) Incontinent Diet (see discharge summary)  AMBULATORY STATUS COMMUNICATION OF NEEDS Skin   Extensive Assist Verbally (Slurred/Dysarthria) Other (Comment), Surgical wounds (closed, left ankle)                       Personal Care Assistance Level of Assistance   Bathing, Dressing, Feeding Bathing Assistance: Maximum assistance Feeding assistance: Limited assistance Dressing Assistance: Maximum assistance     Functional Limitations Info  Sight, Hearing, Speech Sight Info: Adequate Hearing Info: Adequate Speech Info: Impaired (Slurred/Dysarthria)    SPECIAL CARE FACTORS FREQUENCY  PT (By licensed PT), OT (By licensed OT)     PT Frequency: 5x/week OT Frequency: 5x/week            Contractures Contractures Info: Not present    Additional Factors Info  Code Status, Allergies, Psychotropic Code Status Info: FULL Allergies Info: Amiodarone  Rivaroxaban  Verapamil Psychotropic Info: traZODone (DESYREL) tablet 300 mg, OLANZapine zydis (ZYPREXA) disintegrating tablet 15 mg         Current Medications (03/05/2023):  This is the current hospital active medication list Current Facility-Administered Medications  Medication Dose Route Frequency Provider Last Rate Last Admin   acetaminophen (TYLENOL) tablet 650 mg  650 mg Oral Q6H PRN Montez Morita, PA-C       Or   acetaminophen (TYLENOL) suppository 650 mg  650 mg Rectal Q6H PRN Montez Morita, PA-C       aspirin EC tablet 81 mg  81 mg Oral Daily Montez Morita, PA-C   81 mg at 03/05/23 1002   calcium-vitamin D (OSCAL WITH D) 500-5 MG-MCG per tablet 1 tablet  1 tablet Oral Daily Montez Morita, PA-C   1 tablet at 03/05/23 1001   cyproheptadine (PERIACTIN) 4 MG tablet 4 mg  4 mg Oral QHS Montez Morita, PA-C   4 mg at 03/04/23 2131   divalproex (DEPAKOTE ER) 24 hr tablet 1,500  mg  1,500 mg Oral QHS Montez Morita, PA-C   1,500 mg at 03/04/23 2131   ferrous sulfate tablet 325 mg  325 mg Oral Daily Montez Morita, PA-C   325 mg at 03/05/23 1002   HYDROmorphone (DILAUDID) injection 0.5-1 mg  0.5-1 mg Intravenous Q2H PRN Montez Morita, PA-C   0.25 mg at 03/04/23 1259   hydrOXYzine (ATARAX) tablet 50 mg  50 mg Oral BID Montez Morita, PA-C   50 mg at 03/05/23 1002   levothyroxine (SYNTHROID) tablet 75 mcg  75 mcg Oral  Daily Montez Morita, PA-C   75 mcg at 03/05/23 0554   metoprolol succinate (TOPROL-XL) 24 hr tablet 50 mg  50 mg Oral Daily Montez Morita, PA-C   50 mg at 03/05/23 1002   mirtazapine (REMERON) tablet 15 mg  15 mg Oral QHS Montez Morita, PA-C   15 mg at 03/04/23 2131   multivitamin with minerals tablet 1 tablet  1 tablet Oral Daily Montez Morita, PA-C   1 tablet at 03/05/23 1002   OLANZapine zydis (ZYPREXA) disintegrating tablet 15 mg  15 mg Oral QHS Montez Morita, PA-C   15 mg at 03/04/23 2132   oxyCODONE (Oxy IR/ROXICODONE) immediate release tablet 5 mg  5 mg Oral Q4H PRN Montez Morita, PA-C   5 mg at 03/04/23 2131   senna (SENOKOT) tablet 8.6 mg  1 tablet Oral BID Montez Morita, PA-C   8.6 mg at 03/05/23 1001   traZODone (DESYREL) tablet 300 mg  300 mg Oral QHS Montez Morita, PA-C   300 mg at 03/04/23 2130     Discharge Medications: Please see discharge summary for a list of discharge medications.  Relevant Imaging Results:  Relevant Lab Results:   Additional Information SSN:[id number]  Leandria Thier A [place], [place]

## 2023-03-05 NOTE — Progress Notes (Addendum)
Summary: Diamond Bowman is a 57 y.o. F with PMH bipolar 2 disorder, schizophrenia, intellectual disability, osteoporosis, CKD stage 3, heart murmur, hypothyroidism, osteoporosis, seizures who presents after a mechanical fall at home 10/08 resulting in ankle pain limiting mobility.   Subjective:  Patient resting in bed comfortably, in no acute distress. Able to verbalize comments and answer some questions appropriately, at times difficult to understand. Patient did endorse some pain on left foot, did not complain of acute pain elsewhere. She was eating some of her breakfast and requested a diet coke/pepsi. Called Aura Dials (patient's mother) by phone, verified patient identity with DOB, to provide an update.   Objective:  Vital signs in last 24 hours: afebrile, hypertensive, satting well on RA.  Vitals:   03/04/23 2004 03/05/23 0009 03/05/23 0343 03/05/23 0757  BP: 114/76 118/74 128/62 (!) 145/90  Pulse: 87 94 99 99  Resp: 18 17 19 16   Temp: 97.8 F (36.6 C) (!) 97.5 F (36.4 C) 98.5 F (36.9 C) 98 F (36.7 C)  TempSrc: Oral Oral Oral Oral  SpO2: 97% 100% 100% 100%  Weight:      Height:          Latest Ref Rng & Units 03/05/2023    9:55 AM 03/04/2023    5:02 AM 03/04/2023    5:01 AM  CBC  WBC 4.0 - 10.5 K/uL 12.1  7.4  7.6   Hemoglobin 12.0 - 15.0 g/dL 9.3  16.1  09.6   Hematocrit 36.0 - 46.0 % 27.4  30.8  30.2   Platelets 150 - 400 K/uL 75  38  39        Latest Ref Rng & Units 03/05/2023    9:55 AM 03/04/2023    5:02 AM 03/03/2023    1:10 PM  CMP  Glucose 70 - 99 mg/dL 045  409  72   BUN 6 - 20 mg/dL 12  21  22    Creatinine 0.44 - 1.00 mg/dL 8.11  9.14  7.82   Sodium 135 - 145 mmol/L 144  138  143   Potassium 3.5 - 5.1 mmol/L 4.0  4.3  4.8   Chloride 98 - 111 mmol/L 100  99  104   CO2 22 - 32 mmol/L 31  28  31    Calcium 8.9 - 10.3 mg/dL 8.5  8.7  8.8   Total Protein 6.5 - 8.1 g/dL   5.8   Total Bilirubin 0.3 - 1.2 mg/dL   0.7   Alkaline Phos 38 - 126 U/L    39   AST 15 - 41 U/L   28   ALT 0 - 44 U/L   14    Hep C negative MRSA negative  Physical Exam Constitutional: Patient resting in bed, awake, coloring books in hand, eating breakfast Pulmonary/Respiratory: normal respiratory effort on room air Neuro: alert and verbalizing answers and comments appropriately MSK: Left foot in short splint, able to wiggle toes on command, no visible bleeding or discharge, reports sensation of left toes Psych: normal mood  Assessment/Plan:  Principal Problem:   Left trimalleolar fracture, closed, initial encounter  L trimalleolar fracture, closed Osteoporosis Pt is status post ORIF of L ankle performed 10/10 by Dr. Carola Frost. Appears to be doing well. Expressed some pain on left foot with splint in place. Was observed spontaneously moving her LLE. Eating and drinking without issue. Per chart review has gotten 1x oxy 5 mg and 2x dilaudid 0.25 mg for pain over the last  24 hours.   Will be discharged to SNF/acute rehab, appreciate TOC/SW help with this. PT/OT to continue following. Will monitor pain management as she awaits placement.   Plan: -TOC/SW, PT, OT consulted  -Pain management: tylenol 650 mg q6h PRN mild pain, oxycodone 5 mg q4h PRN moderate pain, dilaudid 0.5-1 mg q2h PRN severe pain -Bowel regimen: senna 8.6 mg BID -F/u orthopedic surgery recommendations: non-weight bearing for next 3-5 days, convert to short leg cast in 1 week, OP follow up for repeat imaging in 10 days -Continue home alendronate 70 mg weekly Thursday), calcium carbonate-cholecalciferol 500-15 mg-mcg daily, MV one tablet daily   Elevated BP No history of high blood pressure. Blood pressures 140s-150s/70s-80s after surgery yesterday. Today BP 110s-140s/60s-90s. Will monitor. On metoprolol 50 mg daily for Afib.   Intellectual disability Bipolar 2 disorder Schizophrenia Insomnia Chronic. Will continue home regimen here. Plan: -Continue home olanzapine 15 mg daily at bedtime,  trazodone 300 mg daily at bedtime, mirtazapine 15 mg daily at bedtime, cyproheptadine 4 mg daily at bedtime, hydroxyzine 50 mg BID   Seizure disorder Chronic. Will monitor.  -Continue home depakote 1500 mg daily at bedtime   Hypothyroidism Chronic. TSH last checked in 2023 was normal.  Plan: -Continue home levothyroxine 75 mcg daily   Hx atrial fibrillation on aspirin Chronic. EKG on admission showing NSR. Will monitor.  -Continue home metoprolol 50 mg daily   Hx iron deficiency anemia Chronic. CBC shows stable anemia. Labs from today pending, will follow up as needed.  - Trend CBC - Follow up iron studies - Continue home ferrous sulfate  Thrombocytopenia Chronic. Will trend on daily labs. Will transfuse as needed.   Prior to Admission Living Arrangement: Home Anticipated Discharge Location: SNF Barriers to Discharge: Medical stability Dispo: Anticipated discharge in approximately 1-2 day(s).

## 2023-03-05 NOTE — Progress Notes (Signed)
Name: Diamond Bowman. Tuner DOB: 04-May-1966  Please be advised that the above-named patient will require a short-term nursing home stay -- anticipated 30 days or less for rehabilitation and strengthening. The plan is for return home.

## 2023-03-05 NOTE — TOC Transition Note (Deleted)
Transition of Care [facility]) - CM/SW Discharge Note   Patient Details  Name: Diamond Bowman MRN: [id number] Date of Birth: Oct 23, 1965  Transition [facility] CM/SW Contact:  Ladaisha Portillo A [place], Theresia Majors Phone Number: 03/05/2023, 3:09 PM   Clinical Narrative:     CSW reached out to pt's legal guardian, mother, Aura Dials, on the phone and left voicemail with contact information to reach out to CSW.   Pt is recommended SNF at discharge. SNF work up to be completed.   TOC will continue to follow.    Final next level of care: Home w Home Health Services Barriers to Discharge: Insurance Authorization, SNF Pending bed offer   Patient Goals and CMS Choice      Discharge Placement                         Discharge Plan and Services Additional resources added to the After Visit Summary for   In-house Referral: Clinical Social Work                                   Social Determinants of Health (SDOH) Interventions SDOH Screenings   Food Insecurity: Patient Unable To Answer (03/03/2023)  Housing: Patient Unable To Answer (03/03/2023)  Transportation Needs: Patient Unable To Answer (03/03/2023)  Utilities: Patient Unable To Answer (03/03/2023)  Tobacco Use: Medium Risk (03/03/2023)     Readmission Risk Interventions     No data to display

## 2023-03-05 NOTE — Evaluation (Signed)
Physical Therapy Evaluation Patient Details Name: Diamond Bowman MRN: [id number] DOB: February 27, 1966 Today's Date: 03/05/2023  History of Present Illness  Pt is 57 year old presented to [facility] on  03/03/23 for fall with left leg pain, found to have L ankle trimalleolar fx. Pt underwent ORIF on 10/10. PMH - MDD, BiPolar, Schizophrenia, Mental Retardation, tardive dyskinesia, CKD 3, hypothyroidism  Clinical Impression  Pt presents to PT with deficits in functional mobility, gait, balance, endurance. Pt is resistant to mobilizing this session, may have anxiety related to recent fall as she reports "I am not able to walk" during session. Pt requires totalA to achieve sitting, however mobilizes back to supine unassisted. Pt remains at a high risk for falls/injury due to NWB through LLE. PT will attempt to follow up as time allows to progress mobility. Pt would benefit from short term inpatient PT services at this time. If this is not an option pt will need max caregiver services and DME recommended below.        If plan is discharge home, recommend the following: Two people to help with walking and/or transfers;Two people to help with bathing/dressing/bathroom;Assistance with cooking/housework;Assistance with feeding;Assist for transportation;Help with stairs or ramp for entrance   Can travel by private vehicle   No    Equipment Recommendations Wheelchair (measurements PT);Wheelchair cushion (measurements PT);Hospital bed;Hoyer lift  Recommendations for Other Services       Functional Status Assessment Patient has had a recent decline in their functional status and demonstrates the ability to make significant improvements in function in a reasonable and predictable amount of time.     Precautions / Restrictions Precautions Precautions: Fall Required Braces or Orthoses: Splint/Cast Splint/Cast - Date Prophylactic Dressing Applied (if applicable): 03/04/23 Restrictions Weight Bearing  Restrictions: Yes LLE Weight Bearing: Non weight bearing      Mobility  Bed Mobility Overal bed mobility: Needs Assistance Bed Mobility: Supine to Sit, Sit to Supine     Supine to sit: Total assist Sit to supine: Contact guard assist   General bed mobility comments: pt resistant to supine to sit, quickly returns herself to supine without physical assistance after ~10 seconds sitting    Transfers Overall transfer level:  (pt refuses attempts)                      Ambulation/Gait                  Stairs            Wheelchair Mobility     Tilt Bed    Modified Rankin (Stroke Patients Only)       Balance Overall balance assessment: Needs assistance Sitting-balance support: No upper extremity supported, Feet supported Sitting balance-Leahy Scale: Fair                                       Pertinent Vitals/Pain Pain Assessment Pain Assessment: PAINAD Breathing: normal Negative Vocalization: none Facial Expression: smiling or inexpressive Body Language: tense, distressed pacing, fidgeting Consolability: no need to console PAINAD Score: 1 Pain Intervention(s): Monitored during session    Home Living Family/patient expects to be discharged to:: Unsure   Available Help at Discharge: Personal care attendant Type of Home: House             Additional Comments: pt is unable to report history    Prior Function Prior Level  of Function : Needs assist             Mobility Comments: independent with mobility per OT noted on 10/9       Extremity/Trunk Assessment   Upper Extremity Assessment Upper Extremity Assessment: Difficult to assess due to impaired cognition    Lower Extremity Assessment Lower Extremity Assessment: Difficult to assess due to impaired cognition LLE Deficits / Details: pt moves BLE against gravity, does not consistently follow commands to participate in formal assessment    Cervical / Trunk  Assessment Cervical / Trunk Assessment: Kyphotic  Communication   Communication Communication: Difficulty communicating thoughts/reduced clarity of speech Cueing Techniques: Verbal cues;Gestural cues;Tactile cues  Cognition Arousal: Alert Behavior During Therapy: Anxious Overall Cognitive Status: History of cognitive impairments - at baseline                                 General Comments: pt appears to have some anxiety related to mobilizing, resistant to bed mobility despite max encouragement from PT. Pt reports she cannot walk.        General Comments General comments (skin integrity, edema, etc.): VSS on RA    Exercises     Assessment/Plan    PT Assessment Patient needs continued PT services  PT Problem List Decreased strength;Decreased activity tolerance;Decreased balance;Decreased mobility;Decreased knowledge of use of DME;Decreased safety awareness;Decreased knowledge of precautions;Pain       PT Treatment Interventions DME instruction;Gait training;Stair training;Functional mobility training;Therapeutic activities;Therapeutic exercise;Balance training;Neuromuscular re-education;Cognitive remediation;Patient/family education;Wheelchair mobility training    PT Goals (Current goals can be found in the Care Plan section)  Acute Rehab PT Goals Patient Stated Goal: pt does not state, PT goal to progress to transfer training with NWB LLE PT Goal Formulation: With patient Time For Goal Achievement: 03/19/23 Potential to Achieve Goals: Fair    Frequency Min 1X/week     Co-evaluation               AM-PAC PT "6 Clicks" Mobility  Outcome Measure Help needed turning from your back to your side while in a flat bed without using bedrails?: A Lot Help needed moving from lying on your back to sitting on the side of a flat bed without using bedrails?: A Lot Help needed moving to and from a bed to a chair (including a wheelchair)?: Total Help needed standing  up from a chair using your arms (e.g., wheelchair or bedside chair)?: Total Help needed to walk in hospital room?: Total Help needed climbing 3-5 steps with a railing? : Total 6 Click Score: 8    End of Session   Activity Tolerance: Treatment limited secondary to agitation;Other (comment) (resistant to mobility) Patient left: in bed;with call bell/phone within reach;with bed alarm set Nurse Communication: Mobility status;Need for lift equipment PT Visit Diagnosis: Other abnormalities of gait and mobility (R26.89);History of falling (Z91.81);Pain Pain - Right/Left: Left Pain - part of body: Ankle and joints of foot    Time: 7829-5621 PT Time Calculation (min) (ACUTE ONLY): 28 min   Charges:   PT Evaluation $PT Eval Moderate Complexity: 1 Mod   PT General Charges $$ ACUTE PT VISIT: 1 Visit         Arlyss Gandy, PT, DPT Acute Rehabilitation Office [phone]   Arlyss Gandy 03/05/2023, 9:35 AM

## 2023-03-05 NOTE — Evaluation (Signed)
Speech Language Pathology Evaluation Patient Details Name: Diamond Bowman MRN: [id number] DOB: December 23, 1965 Today's Date: 03/05/2023 Time: 4098-1191 SLP Time Calculation (min) (ACUTE ONLY): 8 min  Problem List:  Patient Active Problem List   Diagnosis Date Noted   Left trimalleolar fracture, closed, initial encounter 03/03/2023   Community acquired pneumonia    Inclusion cyst 11/11/2021   Hyperkalemia 11/10/2021   Acute maculopapular rash 11/08/2021   Closed nondisplaced fracture of distal phalanx of right index finger 11/05/2021   Atrial fibrillation (HCC) 11/04/2021   Thrombocytopenic disorder (HCC) 05/06/2020   Chronic kidney disease, stage 3 (HCC) 01/04/2015   Schizophrenia (HCC) 12/28/2014   Intellectual disability 05/14/2012   Seizure disorder (HCC) 05/14/2012   Bipolar 2 disorder (HCC) 05/14/2012   Hypothyroidism 05/14/2012   Osteoporosis 05/14/2012   Past Medical History:  Past Medical History:  Diagnosis Date   Anemia    Bipolar 2 disorder (HCC) 05/14/2012   Chronic kidney disease    stage 3   Chronic kidney disease, stage 3 (HCC) 01/04/2015   Encephalopathy 01/04/2015   Fall    Heart murmur    stable for yrs-from childhood.   Hypothyroidism 05/14/2012   Intellectual disability 05/14/2012   Mental retardation    Monteggia's fracture 03/31/2019   Osteoporosis    Schizophrenia (HCC)    Seizures (HCC)    last one > 5 yrs   Tardive dyskinesia    Thrombocytopenic disorder (HCC) 05/06/2020   Tinea pedis 11/04/2021   Venous stasis syndrome 11/04/2021   Past Surgical History:  Past Surgical History:  Procedure Laterality Date   I & D EXTREMITY Right 03/13/2019   Procedure: IRRIGATION AND DEBRIDEMENT EXTREMITY;  Surgeon: Ernest Mallick, MD;  Location: [facility] OR;  Service: Orthopedics;  Laterality: Right;   ORIF ULNAR FRACTURE Right 03/13/2019   Procedure: OPEN REDUCTION INTERNAL FIXATION (ORIF) ULNAR FRACTURE;  Surgeon: Ernest Mallick, MD;  Location: [facility]  OR;  Service: Orthopedics;  Laterality: Right;   RADIAL HEAD ARTHROPLASTY Right 03/13/2019   Procedure: RADIAL HEAD ARTHROPLASTY;  Surgeon: Ernest Mallick, MD;  Location: [facility] OR;  Service: Orthopedics;  Laterality: Right;   HPI:  Diamond Bowman is a 57 yo female presenting to ED 10/9 after a mechanical fall and L leg pain. Found to have L ankle trimalleolar fx and underwent ORIF 10/10. PMH includes bipolar, schizophrenia, tardive dyskinesia, intellectual disability, CKD 3, hypothyroidism   Assessment / Plan / Recommendation Clinical Impression  Pt presents with a history of intellectual disability at baseline. She participated with [facility] performance during a functional task of ordering food. Pt does not demonstrate any acute deficits related to language or cognition. Her speech is characterized by articulation deficits which affect intelligibility, although suspect this is not an acute change. No further ST services warranted at this time. Will s/o.    SLP Assessment  SLP Recommendation/Assessment: Patient does not need any further Speech Lanaguage Pathology Services SLP Visit Diagnosis: Cognitive communication deficit (R41.841)    Recommendations for follow up therapy are one component of a multi-disciplinary discharge planning process, led by the attending physician.  Recommendations may be updated based on patient status, additional functional criteria and insurance authorization.    Follow Up Recommendations  Skilled nursing-short term rehab (<3 hours/day)    Assistance Recommended at Discharge  Frequent or constant Supervision/Assistance  Functional Status Assessment Patient has not had a recent decline in their functional status  Frequency and Duration  SLP Evaluation Cognition  Overall Cognitive Status: History of cognitive impairments - at baseline       Comprehension  Auditory Comprehension Overall Auditory Comprehension: Impaired at baseline    Expression  Expression Primary Mode of Expression: Verbal Verbal Expression Overall Verbal Expression: Impaired at baseline Naming: No impairment   Oral / Motor  Oral Motor/Sensory Function Overall Oral Motor/Sensory Function: Within functional limits Motor Speech Overall Motor Speech: Impaired Respiration: Within functional limits Phonation: Normal Resonance: Within functional limits Articulation: Impaired Level of Impairment: Conversation Intelligibility: Intelligibility reduced Sentence: 50-74% accurate            Gwynneth Aliment, M.A., CF-SLP Speech Language Pathology, Acute Rehabilitation Services  Secure Chat preferred [phone]  03/05/2023, 2:32 PM

## 2023-03-05 NOTE — Discharge Instructions (Addendum)
Dear Ms Cooperman, It was a pleasure taking care of you at [facility]. You were admitted for fall and treated for left trimalleolar fracture . We are discharging you home now that you are doing better. Please follow the following instructions.    You were seen for left ankle pain and left trimalleolar fracture after a fall at home on 10/08. Your fracture surgically treated by an open reduction and internal fixation (ORIF) on 10/10. This means your orthopedic surgeon, Dr. Carola Frost, realigned the broken bone and stabilized it.    - Your splint was converted to a short leg cast on 10/17. You may bear weight on your left leg. You will need outpatient follow up with orthopedic surgery for repeat imaging 10 days after surgery.Orthopedic surgery will reach out to you to arrange the appointment.   - We will provide a short course of pain medication to help manage your left leg pain. If you need additional pain medications, please reach out to your primary care provider.   - You will continue to work with physical therapy and occupational therapy at your skilled nursing facility.   - We did not make any changes to your home medications, which were continued when you were admitted to the hospital.   - Please schedule a hospitalization follow up appointment with your primary care provider within the next 10-14 days so they can review your current health needs at that time.   - Please call 911 or go to an emergency room to get evaluated if you have any uncontrolled bleeding, recurrent fall, fever, nausea/vomiting that prevents you from keeping down food, or uncontrolled pain.   Take care,  Dr. Kathleen Lime, MD                 Orthopaedic Trauma Service Discharge Instructions   General Discharge Instructions   WEIGHT BEARING STATUS: Nonweightbearing left leg  RANGE OF MOTION/ACTIVITY: no ankle motion, ok to move knee   Bone health: known osteoporosis, continue per PCP  Review the  following resource for additional information regarding bone health  [email or web address][employer]cy  Wound Care: do not remove splint. Keep splint dry, do not get wet    Diet: as you were eating previously.  Can use over the counter stool softeners and bowel preparations, such as Miralax, to help with bowel movements.  Narcotics can be constipating.  Be sure to drink plenty of fluids  PAIN MEDICATION USE AND EXPECTATIONS  You have likely been given narcotic medications to help control your pain.  After a traumatic event that results in an fracture (broken bone) with or without surgery, it is ok to use narcotic pain medications to help control one's pain.  We understand that everyone responds to pain differently and each individual patient will be evaluated on a regular basis for the continued need for narcotic medications. Ideally, narcotic medication use should last no more than 6-8 weeks (coinciding with fracture healing).   As a patient it is your responsibility as well to monitor narcotic medication use and report the amount and frequency you use these medications when you come to your office visit.   We would also advise that if you are using narcotic medications, you should take a dose prior to therapy to maximize you participation.  IF YOU ARE ON NARCOTIC MEDICATIONS IT IS NOT PERMISSIBLE TO OPERATE A MOTOR VEHICLE (MOTORCYCLE/CAR/TRUCK/MOPED) OR HEAVY MACHINERY DO NOT MIX NARCOTICS WITH OTHER CNS (CENTRAL NERVOUS SYSTEM) DEPRESSANTS SUCH AS ALCOHOL   POST-OPERATIVE OPIOID  TAPER INSTRUCTIONS: It is important to wean off of your opioid medication as soon as possible. If you do not need pain medication after your surgery it is ok to stop day one. Opioids include: Codeine, Hydrocodone(Norco, Vicodin), Oxycodone(Percocet, oxycontin) and hydromorphone amongst others.  Long term and even short term use of opiods can cause: Increased pain  response Dependence Constipation Depression Respiratory depression And more.  Withdrawal symptoms can include Flu like symptoms Nausea, vomiting And more Techniques to manage these symptoms Hydrate well Eat regular healthy meals Stay active Use relaxation techniques(deep breathing, meditating, yoga) Do Not substitute Alcohol to help with tapering If you have been on opioids for less than two weeks and do not have pain than it is ok to stop all together.  Plan to wean off of opioids This plan should start within one week post op of your fracture surgery  Maintain the same interval or time between taking each dose and first decrease the dose.  Cut the total daily intake of opioids by one tablet each day Next start to increase the time between doses. The last dose that should be eliminated is the evening dose.    STOP SMOKING OR USING NICOTINE PRODUCTS!!!!  As discussed nicotine severely impairs your body's ability to heal surgical and traumatic wounds but also impairs bone healing.  Wounds and bone heal by forming microscopic blood vessels (angiogenesis) and nicotine is a vasoconstrictor (essentially, shrinks blood vessels).  Therefore, if vasoconstriction occurs to these microscopic blood vessels they essentially disappear and are unable to deliver necessary nutrients to the healing tissue.  This is one modifiable factor that you can do to dramatically increase your chances of healing your injury.    (This means no smoking, no nicotine gum, patches, etc)  DO NOT USE NONSTEROIDAL ANTI-INFLAMMATORY DRUGS (NSAID'S)  Using products such as Advil (ibuprofen), Aleve (naproxen), Motrin (ibuprofen) for additional pain control during fracture healing can delay and/or prevent the healing response.  If you would like to take over the counter (OTC) medication, Tylenol (acetaminophen) is ok.  However, some narcotic medications that are given for pain control contain acetaminophen as well. Therefore,  you should not exceed more than 4000 mg of tylenol in a day if you do not have liver disease.  Also note that there are may OTC medicines, such as cold medicines and allergy medicines that my contain tylenol as well.  If you have any questions about medications and/or interactions please ask your doctor/PA or your pharmacist.      ICE AND ELEVATE INJURED/OPERATIVE EXTREMITY  Using ice and elevating the injured extremity above your heart can help with swelling and pain control.  Icing in a pulsatile fashion, such as 20 minutes on and 20 minutes off, can be followed.    Do not place ice directly on skin. Make sure there is a barrier between to skin and the ice pack.    Using frozen items such as frozen peas works well as the conform nicely to the are that needs to be iced.  USE AN ACE WRAP OR TED HOSE FOR SWELLING CONTROL  In addition to icing and elevation, Ace wraps or TED hose are used to help limit and resolve swelling.  It is recommended to use Ace wraps or TED hose until you are informed to stop.    When using Ace Wraps start the wrapping distally (farthest away from the body) and wrap proximally (closer to the body)   Example: If you had surgery on your  leg or thing and you do not have a splint on, start the ace wrap at the toes and work your way up to the thigh        If you had surgery on your upper extremity and do not have a splint on, start the ace wrap at your fingers and work your way up to the upper arm  IF YOU ARE IN A SPLINT OR CAST DO NOT REMOVE IT FOR ANY REASON   If your splint gets wet for any reason please contact the office immediately. You may shower in your splint or cast as long as you keep it dry.  This can be done by wrapping in a cast cover or garbage back (or similar)  Do Not stick any thing down your splint or cast such as pencils, money, or hangers to try and scratch yourself with.  If you feel itchy take benadryl as prescribed on the bottle for itching  IF YOU ARE IN  A CAM BOOT (BLACK BOOT)  You may remove boot periodically. Perform daily dressing changes as noted below.  Wash the liner of the boot regularly and wear a sock when wearing the boot. It is recommended that you sleep in the boot until told otherwise    Call office for the following: Temperature greater than 101F Persistent nausea and vomiting Severe uncontrolled pain Redness, tenderness, or signs of infection (pain, swelling, redness, odor or green/yellow discharge around the site) Difficulty breathing, headache or visual disturbances Hives Persistent dizziness or light-headedness Extreme fatigue Any other questions or concerns you may have after discharge  In an emergency, call 911 or go to an Emergency Department at a nearby hospital  HELPFUL INFORMATION  If you had a block, it will wear off between 8-24 hrs postop typically.  This is period when your pain may go from nearly zero to the pain you would have had postop without the block.  This is an abrupt transition but nothing dangerous is happening.  You may take an extra dose of narcotic when this happens.  You should wean off your narcotic medicines as soon as you are able.  Most patients will be off or using minimal narcotics before their first postop appointment.   We suggest you use the pain medication the first night prior to going to bed, in order to ease any pain when the anesthesia wears off. You should avoid taking pain medications on an empty stomach as it will make you nauseous.  Do not drink alcoholic beverages or take illicit drugs when taking pain medications.  In most states it is against the law to drive while you are in a splint or sling.  And certainly against the law to drive while taking narcotics.  You may return to work/school in the next couple of days when you feel up to it.   Pain medication may make you constipated.  Below are a few solutions to try in this order: Decrease the amount of pain medication if  you aren't having pain. Drink lots of decaffeinated fluids. Drink prune juice and/or each dried prunes  If the first 3 don't work start with additional solutions Take Colace - an over-the-counter stool softener Take Senokot - an over-the-counter laxative Take Miralax - a stronger over-the-counter laxative

## 2023-03-05 NOTE — Progress Notes (Addendum)
Orthopaedic Trauma Service Progress Note  Patient ID: Diamond Bowman MRN: [id number] DOB/AGE: [birth year] 56 y.o.  Subjective:  Ortho issues stable Looks comfortable  Able to communicate with pt a little but difficult to understand   No changes with ortho care   ROS As above  Objective:   VITALS:   Vitals:   03/04/23 2004 03/05/23 0009 03/05/23 0343 03/05/23 0757  BP: 114/76 118/74 128/62 (!) 145/90  Pulse: 87 94 99 99  Resp: 18 17 19 16   Temp: 97.8 F (36.6 C) (!) 97.5 F (36.4 C) 98.5 F (36.9 C) 98 F (36.7 C)  TempSrc: Oral Oral Oral Oral  SpO2: 97% 100% 100% 100%  Weight:      Height:        Estimated body mass index is 22.33 kg/m as calculated from the following:   Height as of this encounter: 4\' 6"  (1.372 m).   Weight as of this encounter: 42 kg.   Intake/Output      10/10 0701 10/11 0700 10/11 0701 10/12 0700   P.O. 240    I.V. (mL/kg) 900 (21.4)    Blood 294    IV Piggyback 200    Total Intake(mL/kg) 1634 (38.9)    Urine (mL/kg/hr) 350 (0.3)    Emesis/NG output     Stool     Blood 100    Total Output 450    Net +1184         Urine Occurrence 2 x      LABS  No results found for this or any previous visit (from the past 24 hour(s)).   PHYSICAL EXAM:   Gen: awake, sitting up in bed coloring  Lungs: unlabored Ext:       Left Lower Extremity  Dressing/splint is clean, dry and intact  Extremity is warm  Compartments are soft  No pain out of proportion with passive stretching of his toes or ankle  Difficulty to assess motor or sensory functions   + DP pulse   Assessment/Plan: 1 Day Post-Op   Principal Problem:   Left trimalleolar fracture, closed, initial encounter   Anti-infectives (From admission, onward)    Start     Dose/Rate Route Frequency Ordered Stop   03/04/23 1615  ceFAZolin (ANCEF) IVPB 2g/100 mL premix        2 g 200 mL/hr over 30 Minutes  Intravenous Every 8 hours 03/04/23 1519 03/05/23 0624   03/04/23 0931  ceFAZolin (ANCEF) 2-4 GM/100ML-% IVPB  Status:  Discontinued       Note to Pharmacy: [facility] [place]: cabinet override      03/04/23 0931 03/04/23 1044   03/04/23 0915  ceFAZolin (ANCEF) IVPB 2g/100 mL premix        2 g 200 mL/hr over 30 Minutes Intravenous On call to O.R. 03/04/23 0815 03/04/23 1145     .  POD/HD#: 1  57 y/o female with closed left ankle fracture s/p ORIF   Weightbearing: ideally she should be nonweightbearing on her left leg however this will be challenging due to her intellectual disability  Convert to short leg cast in 1 week   ROM: No ankle motion as she is splinted Insicional and dressing care: Dressings left intact until follow-up Pain management: Multimodal ID: periop abx  Impediments to Fracture Healing: osteoporosis, fragility fracture, atypical  antipsychotic med use  Bone Health/Optimization: continue as per PCP  Orthopedic device(s): Splint  VTE prophylaxis: currently on asa, continue this   Dispo: ortho issues stable  Follow - up plan: 1 week  Contact information:  Myrene Galas MD, Montez Morita PA-C   Mearl Latin, PA-C [phone] (C) 03/05/2023, 11:13 AM  [facility] [address] [place] [address] [phone] [phone] (F)    After 5pm and on the weekends please log on to Amion, go to orthopaedics and the look under the [facility] Call for the provider(s) on call. You can also call our office at [phone] and then follow the prompts to be connected to the call team.  Patient ID: Diamond Bowman, female   DOB: 09/08/1965, 57 y.o.   MRN: [id number]

## 2023-03-05 NOTE — Plan of Care (Signed)
  Problem: Clinical Measurements: Goal: Ability to maintain clinical measurements within normal limits will improve Outcome: Progressing Goal: Will remain free from infection Outcome: Progressing Goal: Diagnostic test results will improve Outcome: Progressing Goal: Respiratory complications will improve Outcome: Progressing Goal: Cardiovascular complication will be avoided Outcome: Progressing   Problem: Activity: Goal: Risk for activity intolerance will decrease Outcome: Progressing   Problem: Nutrition: Goal: Adequate nutrition will be maintained Outcome: Progressing   Problem: Coping: Goal: Level of anxiety will decrease Outcome: Progressing   Problem: Elimination: Goal: Will not experience complications related to bowel motility Outcome: Progressing Goal: Will not experience complications related to urinary retention Outcome: Progressing   Problem: Pain Managment: Goal: General experience of comfort will improve Outcome: Progressing   Problem: Safety: Goal: Ability to remain free from injury will improve Outcome: Progressing   Problem: Skin Integrity: Goal: Risk for impaired skin integrity will decrease Outcome: Progressing   Problem: Education: Goal: Knowledge of General Education information will improve Description: Including pain rating scale, medication(s)/side effects and non-pharmacologic comfort measures Outcome: Not Progressing  Patient have cognitive impairment, will continue to educate patient to reinforce general education.  Problem: Health Behavior/Discharge Planning: Goal: Ability to manage health-related needs will improve Outcome: Not Progressing  Patient with cognitive impairment, will continue to educate patient to reinforce general education in relation to health management.

## 2023-03-05 NOTE — TOC Initial Note (Signed)
Transition of Care [facility]) - Initial/Assessment Note    Patient Details  Name: Diamond Bowman MRN: [id number] Date of Birth: Oct 05, 1965  [facility]) CM/SW Contact:    Olman Yono A [place], Theresia Majors Phone Number: 03/05/2023, 3:29 PM  Clinical Narrative:                  CSW reached out to pt's legal guardian, mother, Aura Dials, on the phone and left voicemail with contact information to reach out to CSW.    Pt is recommended SNF at discharge. SNF work up to be completed.    TOC will continue to follow.     Expected Discharge Plan: Skilled Nursing Facility Barriers to Discharge: Insurance Authorization, SNF Pending bed offer   Patient Goals and CMS Choice            Expected Discharge Plan and Services In-house Referral: Clinical Social Work     Living arrangements for the past 2 months: Single Family Home                                      Prior Living Arrangements/Services Living arrangements for the past 2 months: Single Family Home Lives with:: Parents          Need for Family Participation in Patient Care: Yes (Comment) Care giver support system in place?: Yes (comment)   Criminal Activity/Legal Involvement Pertinent to Current Situation/Hospitalization: No - Comment as needed  Activities of Daily Living   ADL Screening (condition at time of admission) Independently performs ADLs?: No Does the patient have a NEW difficulty with bathing/dressing/toileting/self-feeding that is expected to last >3 days?: Yes (Initiates electronic notice to provider for possible OT consult) Does the patient have a NEW difficulty with getting in/out of bed, walking, or climbing stairs that is expected to last >3 days?: Yes (Initiates electronic notice to provider for possible PT consult) Does the patient have a NEW difficulty with communication that is expected to last >3 days?: Yes (Initiates electronic notice to provider for possible SLP consult) Is the patient  deaf or have difficulty hearing?: Yes Does the patient have difficulty seeing, even when wearing glasses/contacts?: No Does the patient have difficulty concentrating, remembering, or making decisions?: Yes  Permission Sought/Granted                  Emotional Assessment Appearance:: Appears stated age Attitude/Demeanor/Rapport: Unable to Assess Affect (typically observed): Unable to Assess Orientation: : Oriented to Self Alcohol / Substance Use: Not Applicable Psych Involvement: No (comment)  Admission diagnosis:  Closed trimalleolar fracture of left ankle, initial encounter [S82.852A] Left trimalleolar fracture, closed, initial encounter [W09.811B] Patient Active Problem List   Diagnosis Date Noted   Left trimalleolar fracture, closed, initial encounter 03/03/2023   Community acquired pneumonia    Inclusion cyst 11/11/2021   Hyperkalemia 11/10/2021   Acute maculopapular rash 11/08/2021   Closed nondisplaced fracture of distal phalanx of right index finger 11/05/2021   Atrial fibrillation (HCC) 11/04/2021   Thrombocytopenic disorder (HCC) 05/06/2020   Chronic kidney disease, stage 3 (HCC) 01/04/2015   Schizophrenia (HCC) 12/28/2014   Intellectual disability 05/14/2012   Seizure disorder (HCC) 05/14/2012   Bipolar 2 disorder (HCC) 05/14/2012   Hypothyroidism 05/14/2012   Osteoporosis 05/14/2012   PCP:  [facility] Pharmacy:   [facility] - [place], [place] - [address] [place]  [address] [place] [address] Phone: [phone] Fax: [phone]  [facility] [address] [place] [address] Phone: [phone] Fax: [phone]     Social Determinants of Health (SDOH) Social History: SDOH Screenings   Food Insecurity: Patient Unable To Answer (03/03/2023)  Housing: Patient Unable To Answer (03/03/2023)  Transportation Needs: Patient Unable To Answer (03/03/2023)  Utilities: Patient Unable  To Answer (03/03/2023)  Tobacco Use: Medium Risk (03/03/2023)   SDOH Interventions:     Readmission Risk Interventions     No data to display

## 2023-03-06 DIAGNOSIS — M810 Age-related osteoporosis without current pathological fracture: Secondary | ICD-10-CM | POA: Diagnosis not present

## 2023-03-06 DIAGNOSIS — S82852A Displaced trimalleolar fracture of left lower leg, initial encounter for closed fracture: Secondary | ICD-10-CM | POA: Diagnosis not present

## 2023-03-06 LAB — CBC
HCT: 27.8 % — ABNORMAL LOW (ref 36.0–46.0)
Hemoglobin: 9.3 g/dL — ABNORMAL LOW (ref 12.0–15.0)
MCH: 34.1 pg — ABNORMAL HIGH (ref 26.0–34.0)
MCHC: 33.5 g/dL (ref 30.0–36.0)
MCV: 101.8 fL — ABNORMAL HIGH (ref 80.0–100.0)
Platelets: 79 10*3/uL — ABNORMAL LOW (ref 150–400)
RBC: 2.73 MIL/uL — ABNORMAL LOW (ref 3.87–5.11)
RDW: 14.4 % (ref 11.5–15.5)
WBC: 12.2 10*3/uL — ABNORMAL HIGH (ref 4.0–10.5)
nRBC: 0.2 % (ref 0.0–0.2)

## 2023-03-06 LAB — BASIC METABOLIC PANEL
Anion gap: 9 (ref 5–15)
BUN: 9 mg/dL (ref 6–20)
CO2: 31 mmol/L (ref 22–32)
Calcium: 8.9 mg/dL (ref 8.9–10.3)
Chloride: 102 mmol/L (ref 98–111)
Creatinine, Ser: 1.33 mg/dL — ABNORMAL HIGH (ref 0.44–1.00)
GFR, Estimated: 46 mL/min — ABNORMAL LOW (ref >60)
Glucose, Bld: 100 mg/dL — ABNORMAL HIGH (ref 70–99)
Potassium: 4.9 mmol/L (ref 3.5–5.1)
Sodium: 142 mmol/L (ref 135–145)

## 2023-03-06 LAB — TSH: TSH: 2.791 u[IU]/mL (ref 0.350–4.500)

## 2023-03-06 NOTE — Progress Notes (Signed)
Summary: Diamond Bowman is a 57 y.o. F with PMH bipolar 2 disorder, schizophrenia, intellectual disability, osteoporosis, CKD stage 3, heart murmur, hypothyroidism, osteoporosis, seizures who presents after a mechanical fall at home 10/08 resulting in left ankle pain limiting mobility.   Subjective:  No overnight events. Patient resting comfortably in bed, talked about balloon and drawings near her window. States her LLE is sore. Denied other complaints. Able to eat some of her breakfast without any issue. Requested chocolate ice cream.   Objective:  Vital signs in last 24 hours: afebrile, hypertensive, satting well on RA.  Vitals:   03/05/23 1604 03/05/23 2007 03/05/23 2030 03/06/23 0552  BP: (!) 146/65 (!) 162/72 (!) 158/68 (!) 119/104  Pulse: 85 88  93  Resp: 16 18  18   Temp: 98.9 F (37.2 C) 99.1 F (37.3 C)  99.6 F (37.6 C)  TempSrc: Oral Oral  Oral  SpO2: 99% 100%  95%  Weight:      Height:          Latest Ref Rng & Units 03/05/2023    9:55 AM 03/04/2023    5:02 AM 03/04/2023    5:01 AM  CBC  WBC 4.0 - 10.5 K/uL 12.1  7.4  7.6   Hemoglobin 12.0 - 15.0 g/dL 9.3  13.0  86.5   Hematocrit 36.0 - 46.0 % 27.4  30.8  30.2   Platelets 150 - 400 K/uL 75  38  39        Latest Ref Rng & Units 03/05/2023    9:55 AM 03/04/2023    5:02 AM 03/03/2023    1:10 PM  CMP  Glucose 70 - 99 mg/dL 784  696  72   BUN 6 - 20 mg/dL 12  21  22    Creatinine 0.44 - 1.00 mg/dL 2.95  2.84  1.32   Sodium 135 - 145 mmol/L 144  138  143   Potassium 3.5 - 5.1 mmol/L 4.0  4.3  4.8   Chloride 98 - 111 mmol/L 100  99  104   CO2 22 - 32 mmol/L 31  28  31    Calcium 8.9 - 10.3 mg/dL 8.5  8.7  8.8   Total Protein 6.5 - 8.1 g/dL   5.8   Total Bilirubin 0.3 - 1.2 mg/dL   0.7   Alkaline Phos 38 - 126 U/L   39   AST 15 - 41 U/L   28   ALT 0 - 44 U/L   14    Physical Exam Constitutional: Patient resting in bed, in no acute distress.  Pulmonary/Respiratory: normal respiratory effort on room  air. Neuro: alert and verbalizing answers and comments appropriately. MSK: Left foot in short splint, wiggled toes on command, BLE warm to touch, no visible bleeding or discharge. Psych: normal mood.  Assessment/Plan:  Principal Problem:   Left trimalleolar fracture, closed, initial encounter  L trimalleolar fracture, closed Osteoporosis Pt is status post ORIF of L ankle performed 10/10 by Dr. Carola Frost. Difficult to gauge pain based on patient's ability to express herself. Eating and drinking ok, does not appear to be in distress in bed. Does endorse soreness on LLE. Will try to get a visual representation of pain to better assess how she's doing.  Will be discharged to SNF/acute rehab, appreciate TOC/SW help with this. PT/OT to continue following. Will monitor pain management as she awaits placement.   Plan: -Pain management: tylenol 650 mg q6h PRN mild pain, oxycodone 5 mg q4h  PRN moderate pain, dilaudid 0.5-1 mg q2h PRN severe pain -Bowel regimen: senna 8.6 mg BID -F/u orthopedic surgery recommendations: non-weight bearing for next 3-5 days, convert to short leg cast in 1 week, OP follow up for repeat imaging in 10 days -Continue home alendronate 70 mg weekly Thursday), calcium carbonate-cholecalciferol 500-15 mg-mcg daily, MV one tablet daily   Elevated BP No history of high blood pressure. Blood pressures 140s-150s/70s-80s after surgery yesterday. Today BP 110s-140s/60s-90s. Will monitor. On metoprolol 50 mg daily for Afib.   Intellectual disability Bipolar 2 disorder Schizophrenia Insomnia Chronic. Will continue home regimen here. Plan: -Continue home olanzapine 15 mg daily at bedtime, trazodone 300 mg daily at bedtime, mirtazapine 15 mg daily at bedtime, cyproheptadine 4 mg daily at bedtime, hydroxyzine 50 mg BID   Seizure disorder Chronic. Will monitor.  -Continue home depakote 1500 mg daily at bedtime   Hypothyroidism Chronic. TSH last checked in 2023 was normal. TSH 10/12  2.791 WNL.  Plan: -Continue home levothyroxine 75 mcg daily   Hx atrial fibrillation on aspirin Chronic. EKG on admission showing NSR. Will monitor.  -Continue home metoprolol 50 mg daily   Hx iron deficiency anemia Chronic. CBC shows stable anemia. Labs from today pending, will follow up as needed.  - Trend CBC - Follow up iron studies - Continue home ferrous sulfate  Thrombocytopenia Chronic. Will trend on daily labs. Will transfuse as needed.   Prior to Admission Living Arrangement: Home Anticipated Discharge Location: SNF Barriers to Discharge: Medical stability Dispo: Anticipated discharge in approximately 1-2 day(s).

## 2023-03-07 DIAGNOSIS — S82852A Displaced trimalleolar fracture of left lower leg, initial encounter for closed fracture: Secondary | ICD-10-CM | POA: Diagnosis not present

## 2023-03-07 DIAGNOSIS — M810 Age-related osteoporosis without current pathological fracture: Secondary | ICD-10-CM | POA: Diagnosis not present

## 2023-03-07 LAB — CBC WITH DIFFERENTIAL/PLATELET
Abs Immature Granulocytes: 0.04 10*3/uL (ref 0.00–0.07)
Basophils Absolute: 0 10*3/uL (ref 0.0–0.1)
Basophils Relative: 0 %
Eosinophils Absolute: 0 10*3/uL (ref 0.0–0.5)
Eosinophils Relative: 0 %
HCT: 26.4 % — ABNORMAL LOW (ref 36.0–46.0)
Hemoglobin: 8.8 g/dL — ABNORMAL LOW (ref 12.0–15.0)
Immature Granulocytes: 1 %
Lymphocytes Relative: 38 %
Lymphs Abs: 2.6 10*3/uL (ref 0.7–4.0)
MCH: 34.2 pg — ABNORMAL HIGH (ref 26.0–34.0)
MCHC: 33.3 g/dL (ref 30.0–36.0)
MCV: 102.7 fL — ABNORMAL HIGH (ref 80.0–100.0)
Monocytes Absolute: 1 10*3/uL (ref 0.1–1.0)
Monocytes Relative: 14 %
Neutro Abs: 3.2 10*3/uL (ref 1.7–7.7)
Neutrophils Relative %: 47 %
Platelets: 59 10*3/uL — ABNORMAL LOW (ref 150–400)
RBC: 2.57 MIL/uL — ABNORMAL LOW (ref 3.87–5.11)
RDW: 14.1 % (ref 11.5–15.5)
WBC: 6.9 10*3/uL (ref 4.0–10.5)
nRBC: 0 % (ref 0.0–0.2)

## 2023-03-07 NOTE — Progress Notes (Signed)
Summary: Diamond Bowman is a 57 y.o. F with PMH bipolar 2 disorder, schizophrenia, intellectual disability, osteoporosis, CKD stage 3, heart murmur, hypothyroidism, osteoporosis, seizures who presents after a mechanical fall at home 10/08 resulting in left ankle pain limiting mobility.   Subjective:  No overnight events. Patient resting comfortably in bed, talked about balloon and drawings near her window. States her LLE is sore. Denied other complaints. Able to eat some of her breakfast without any issue. Requested chocolate ice cream.   Objective:  Vital signs in last 24 hours: afebrile, normotensive, satting well on RA.  Vitals:   03/06/23 1700 03/06/23 2111 03/07/23 0536 03/07/23 0700  BP: (!) 144/46 (!) 136/106 (!) 124/53 119/72  Pulse: 93 91 92 90  Resp: 18 18 18 17   Temp: 98.2 F (36.8 C) 99.5 F (37.5 C) 98.1 F (36.7 C) 98.2 F (36.8 C)  TempSrc: Oral Oral Oral Oral  SpO2: 93% 96% 93% 94%  Weight:      Height:          Latest Ref Rng & Units 03/07/2023    8:42 AM 03/06/2023    6:07 AM 03/05/2023    9:55 AM  CBC  WBC 4.0 - 10.5 K/uL 6.9  12.2  12.1   Hemoglobin 12.0 - 15.0 g/dL 8.8  9.3  9.3   Hematocrit 36.0 - 46.0 % 26.4  27.8  27.4   Platelets 150 - 400 K/uL 59  79  75        Latest Ref Rng & Units 03/06/2023    6:07 AM 03/05/2023    9:55 AM 03/04/2023    5:02 AM  CMP  Glucose 70 - 99 mg/dL 403  474  259   BUN 6 - 20 mg/dL 9  12  21    Creatinine 0.44 - 1.00 mg/dL 5.63  8.75  6.43   Sodium 135 - 145 mmol/L 142  144  138   Potassium 3.5 - 5.1 mmol/L 4.9  4.0  4.3   Chloride 98 - 111 mmol/L 102  100  99   CO2 22 - 32 mmol/L 31  31  28    Calcium 8.9 - 10.3 mg/dL 8.9  8.5  8.7    Physical Exam Constitutional: Patient resting in bed, in no acute distress.  Pulmonary/Respiratory: normal respiratory effort on room air. Neuro: alert and verbalizing answers and comments appropriately. MSK: Left foot in wrap and splint. Able to move feet and toes.  Psych:  normal mood.  Assessment/Plan:  Principal Problem:   Left trimalleolar fracture, closed, initial encounter  L trimalleolar fracture, closed Osteoporosis Pt is status post ORIF of L ankle performed 10/10 by Dr. Carola Frost. Difficult to gauge pain based on patient's ability to express herself. Eating and drinking ok, does not appear to be in distress in bed. Does endorse soreness on LLE. Will try to get a visual representation of pain to better assess how she's doing.  Will be discharged to SNF/acute rehab, appreciate TOC/SW help with this. PT/OT to continue following. Will monitor pain management as she awaits placement.   Plan: -Pain management: tylenol 650 mg q6h PRN mild pain, oxycodone 5 mg q4h PRN moderate pain, dilaudid 0.5-1 mg q2h PRN severe pain -Bowel regimen: senna 8.6 mg BID -F/u orthopedic surgery recommendations: non-weight bearing for next 3-5 days, convert to short leg cast in 1 week, OP follow up for repeat imaging in 10 days -Continue home alendronate 70 mg weekly Thursday), calcium carbonate-cholecalciferol 500-15 mg-mcg daily, MV  one tablet daily   Elevated BP Resolved today.   Intellectual disability Bipolar 2 disorder Schizophrenia Insomnia Chronic. Will continue home regimen here. Plan: -Continue home olanzapine 15 mg daily at bedtime, trazodone 300 mg daily at bedtime, mirtazapine 15 mg daily at bedtime, cyproheptadine 4 mg daily at bedtime, hydroxyzine 50 mg BID   Seizure disorder Chronic. Will monitor.  -Continue home depakote 1500 mg daily at bedtime   Hypothyroidism Chronic. TSH last checked in 2023 was normal. TSH 10/12 2.791 WNL.  Plan: -Continue home levothyroxine 75 mcg daily   Hx atrial fibrillation on aspirin Chronic. EKG on admission showing NSR. Will monitor.  -Continue home metoprolol 50 mg daily   Hx iron deficiency anemia Chronic. CBC shows stable anemia. Iron level checked at 17, Tsat 7 %. Ferritin 269 consistent with iron deficiency  anemia. Will add B12 and folate given her anemia is microcytic despite iron deficiency.  - Trend CBC - Follow up iron studies - Continue home ferrous sulfate  Thrombocytopenia Chronic. Will trend on daily labs. Will transfuse as needed.   Prior to Admission Living Arrangement: Home Anticipated Discharge Location: SNF Barriers to Discharge: Medical stability Dispo: Anticipated discharge in approximately 1-2 day(s).

## 2023-03-08 DIAGNOSIS — M810 Age-related osteoporosis without current pathological fracture: Secondary | ICD-10-CM | POA: Diagnosis not present

## 2023-03-08 DIAGNOSIS — S82852A Displaced trimalleolar fracture of left lower leg, initial encounter for closed fracture: Secondary | ICD-10-CM | POA: Diagnosis not present

## 2023-03-08 NOTE — TOC Progression Note (Signed)
Transition of Care [facility]) - Progression Note    Patient Details  Name: Diamond Bowman MRN: [id number] Date of Birth: Oct 18, 1965  [facility] CM/SW Contact  Mega Kinkade A [place], [place] Phone Number: 03/08/2023, 11:53 AM  Clinical Narrative:     CSW was contacted by pt's mother and legal guardian, Aura Dials. CSW informed her of SNF work up and bed offers.   She stated that she would follow up with pt's care taker, [name] and make decision for facility today.   She gave CSW permission to speak and coordinate with caretaker, and said that pt has been living with caretaker for the last 19 years.   Pt able to discharge once decision for facility has been made and authorization has been completed by facility.   TOC will continue to follow.  Expected Discharge Plan: Skilled Nursing Facility Barriers to Discharge: English as a second language teacher, SNF Pending bed offer  Expected Discharge Plan and Services In-house Referral: Clinical Social Work     Living arrangements for the past 2 months: Single Family Home                                       Social Determinants of Health (SDOH) Interventions SDOH Screenings   Food Insecurity: Patient Unable To Answer (03/03/2023)  Housing: Patient Unable To Answer (03/03/2023)  Transportation Needs: Patient Unable To Answer (03/03/2023)  Utilities: Patient Unable To Answer (03/03/2023)  Tobacco Use: Medium Risk (03/03/2023)    Readmission Risk Interventions     No data to display

## 2023-03-08 NOTE — Progress Notes (Signed)
Summary: Diamond Bowman. Sahagian is a 57 y.o. F with PMH bipolar 2 disorder, schizophrenia, intellectual disability, osteoporosis, CKD stage 3, heart murmur, hypothyroidism, osteoporosis, seizures who presents after a mechanical fall at home 10/08 resulting in left ankle pain limiting mobility.   Subjective:  No overnight events. Patient resting comfortably in bed after bathing with nursing assistance. Patient talked about her coloring books. Did not respond directly to any pain in left ankle. Did not follow simple prompts during exam, difficult to assess any changes compared to previous exams but does not appear to be in any distress. Will follow up with SW regarding placement.   Objective:  Vital signs in last 24 hours: afebrile, normotensive, satting well on RA.  Vitals:   03/07/23 0700 03/07/23 1700 03/07/23 2037 03/08/23 0613  BP: 119/72 118/65 126/79 (!) 146/45  Pulse: 90 92 88 77  Resp: 17 16 18 18   Temp: 98.2 F (36.8 C) 99.9 F (37.7 C) 98.4 F (36.9 C) 97.6 F (36.4 C)  TempSrc: Oral Oral Oral Oral  SpO2: 94% (!) 86% 98% 100%  Weight:      Height:       Lab holiday 10/14     Latest Ref Rng & Units 03/07/2023    8:42 AM 03/06/2023    6:07 AM 03/05/2023    9:55 AM  CBC  WBC 4.0 - 10.5 K/uL 6.9  12.2  12.1   Hemoglobin 12.0 - 15.0 g/dL 8.8  9.3  9.3   Hematocrit 36.0 - 46.0 % 26.4  27.8  27.4   Platelets 150 - 400 K/uL 59  79  75        Latest Ref Rng & Units 03/06/2023    6:07 AM 03/05/2023    9:55 AM 03/04/2023    5:02 AM  CMP  Glucose 70 - 99 mg/dL 191  478  295   BUN 6 - 20 mg/dL 9  12  21    Creatinine 0.44 - 1.00 mg/dL 6.21  3.08  6.57   Sodium 135 - 145 mmol/L 142  144  138   Potassium 3.5 - 5.1 mmol/L 4.9  4.0  4.3   Chloride 98 - 111 mmol/L 102  100  99   CO2 22 - 32 mmol/L 31  31  28    Calcium 8.9 - 10.3 mg/dL 8.9  8.5  8.7    Physical Exam Constitutional: Patient resting in bed in no acute distress.  Pulmonary/Respiratory: Normal respiratory effort  on room air.  Neuro: Alert, having some difficulty answering simple questions appropriately, changed topics or made comments about something else (e.g., coloring books).  MSK: Left foot in wrap and splint. No blood or drainage observed.    Assessment/Plan:  Principal Problem:   Left trimalleolar fracture, closed, initial encounter  L trimalleolar fracture, closed Osteoporosis Pt is status post ORIF of L ankle performed 10/10 by Dr. Carola Frost. Difficult to gauge pain based on patient's ability to express herself. Eating and drinking ok, does not appear to be in distress in bed. Not endorsing any pain or soreness today. Has been receiving Tylenol and Oxy PRN for pain.   Will be discharged to SNF/acute rehab, appreciate TOC/SW help with this. PT/OT to continue following. Will monitor pain management as she awaits placement.   Plan: -Pain management: tylenol 650 mg q6h PRN mild pain, oxycodone 5 mg q4h PRN moderate pain, dilaudid 0.5-1 mg q2h PRN severe pain -Bowel regimen: senna 8.6 mg BID -F/u orthopedic surgery recommendations: non-weight bearing  for next 3-5 days, convert to short leg cast in 1 week, OP follow up for repeat imaging in 10 days -Continue home alendronate 70 mg weekly Thursday), calcium carbonate-cholecalciferol 500-15 mg-mcg daily, MV one tablet daily   Elevated BP Resolved today.   Intellectual disability Bipolar 2 disorder Schizophrenia Insomnia Chronic. Will continue home regimen here. Plan: -Continue home olanzapine 15 mg daily at bedtime, trazodone 300 mg daily at bedtime, mirtazapine 15 mg daily at bedtime, cyproheptadine 4 mg daily at bedtime, hydroxyzine 50 mg BID   Seizure disorder Chronic. Will monitor.  -Continue home depakote 1500 mg daily at bedtime.   Hypothyroidism Chronic. TSH last checked in 2023 was normal. TSH 10/12 2.791 WNL.  Plan: -Continue home levothyroxine 75 mcg daily.   Hx atrial fibrillation on aspirin Chronic. EKG on admission showing  NSR. Will monitor.  -Continue home metoprolol 50 mg daily.   Hx iron deficiency anemia Chronic. CBC shows stable anemia. Iron level checked at 17, Tsat 7 %. Ferritin 269 consistent with iron deficiency anemia. Will add B12 and folate given her anemia is microcytic despite iron deficiency.  - Trend CBC, lab holiday today.  - Continue home ferrous sulfate.  Thrombocytopenia Chronic. Will trend on labs, lab holiday today. Will transfuse as needed.   Prior to Admission Living Arrangement: Home Anticipated Discharge Location: SNF Barriers to Discharge: Medical stability Dispo: Anticipated discharge in approximately 1-2 day(s).   Sandor Arboleda Colbert Coyer, MD Internal Medicine Teaching Service, PGY-1 Please contact the on call pager after 5 pm and on weekends at [phone].

## 2023-03-08 NOTE — Progress Notes (Signed)
Physical Therapy Treatment Patient Details Name: Diamond Bowman MRN: [id number] DOB: 22-Nov-1965 Today's Date: 03/08/2023   History of Present Illness Pt is 58 year old presented to [facility] hospital on  03/03/23 for fall with left leg pain, found to have L ankle trimalleolar fx. Pt underwent ORIF on 10/10. PMH - MDD, BiPolar, Schizophrenia, Mental Retardation, tardive dyskinesia, CKD 3, hypothyroidism    PT Comments  Pt is resistant to mobilizing again this session. Pt reports she cannot stand, may be anxious about mobilizing after recent fall. Pt demonstrates good strength of all extremities, however immediately returns to supine after being assisted into sitting. Pt remains at a high risk for falls due to mobility deficits and WB precautions. PT will continue to follow in an effort to progress mobility as the pt is willing.    If plan is discharge home, recommend the following: Two people to help with walking and/or transfers;Two people to help with bathing/dressing/bathroom;Assistance with cooking/housework;Assistance with feeding;Direct supervision/assist for medications management;Direct supervision/assist for financial management;Assist for transportation;Help with stairs or ramp for entrance;Supervision due to cognitive status   Can travel by private vehicle     No  Equipment Recommendations  Wheelchair (measurements PT);Wheelchair cushion (measurements PT);Hospital bed;Hoyer lift    Recommendations for Other Services       Precautions / Restrictions Precautions Precautions: Fall Restrictions Weight Bearing Restrictions: Yes LLE Weight Bearing: Non weight bearing     Mobility  Bed Mobility Overal bed mobility: Needs Assistance Bed Mobility: Supine to Sit, Sit to Supine     Supine to sit: Max assist, HOB elevated, Used rails Sit to supine: Contact guard assist   General bed mobility comments: pt mobilizes LE to edge of bed, requires maxA to bring trunk into sitting. Pt with a  strong poserior lean at trunk. Pt quickly returns to supine after only momentarily sitting at edge of bed    Transfers                        Ambulation/Gait                   Stairs             Wheelchair Mobility     Tilt Bed    Modified Rankin (Stroke Patients Only)       Balance Overall balance assessment:  (unable to assess, pt does not remain in sitting long enough)                                          Cognition Arousal: Alert Behavior During Therapy: Anxious Overall Cognitive Status: History of cognitive impairments - at baseline                                 General Comments: pt is resistant to mobilizing, poor understanding of the need to mobilize at this time. PT senses some anxiety around potentially falling again        Exercises      General Comments General comments (skin integrity, edema, etc.): VSS on RA      Pertinent Vitals/Pain Pain Assessment Pain Assessment: Faces Faces Pain Scale: Hurts little more Pain Location: L foot Pain Descriptors / Indicators: Grimacing Pain Intervention(s): Monitored during session    Home Living  Prior Function            PT Goals (current goals can now be found in the care plan section) Acute Rehab PT Goals Patient Stated Goal: pt does not state, PT goal to progress to transfer training with NWB LLE Progress towards PT goals: Not progressing toward goals - comment (resistant to mobilizing)    Frequency    Min 1X/week      PT Plan      Co-evaluation              AM-PAC PT "6 Clicks" Mobility   Outcome Measure  Help needed turning from your back to your side while in a flat bed without using bedrails?: A Lot Help needed moving from lying on your back to sitting on the side of a flat bed without using bedrails?: A Lot Help needed moving to and from a bed to a chair (including a wheelchair)?:  Total Help needed standing up from a chair using your arms (e.g., wheelchair or bedside chair)?: Total Help needed to walk in hospital room?: Total Help needed climbing 3-5 steps with a railing? : Total 6 Click Score: 8    End of Session   Activity Tolerance: Other (comment) (pt resistant to mobilizing) Patient left: in bed;with call bell/phone within reach;with bed alarm set Nurse Communication: Mobility status;Need for lift equipment PT Visit Diagnosis: Other abnormalities of gait and mobility (R26.89);History of falling (Z91.81);Pain Pain - Right/Left: Left Pain - part of body: Ankle and joints of foot     Time: 1914-7829 PT Time Calculation (min) (ACUTE ONLY): 15 min  Charges:    $Therapeutic Activity: 8-22 mins PT General Charges $$ ACUTE PT VISIT: 1 Visit                     Arlyss Gandy, PT, DPT Acute Rehabilitation Office [phone]    Arlyss Gandy 03/08/2023, 3:01 PM

## 2023-03-09 ENCOUNTER — Encounter (HOSPITAL_COMMUNITY): Payer: Self-pay | Admitting: Orthopedic Surgery

## 2023-03-09 DIAGNOSIS — S82852A Displaced trimalleolar fracture of left lower leg, initial encounter for closed fracture: Secondary | ICD-10-CM | POA: Diagnosis not present

## 2023-03-09 LAB — CBC
HCT: 25.7 % — ABNORMAL LOW (ref 36.0–46.0)
Hemoglobin: 8.6 g/dL — ABNORMAL LOW (ref 12.0–15.0)
MCH: 33.1 pg (ref 26.0–34.0)
MCHC: 33.5 g/dL (ref 30.0–36.0)
MCV: 98.8 fL (ref 80.0–100.0)
Platelets: UNDETERMINED 10*3/uL (ref 150–400)
RBC: 2.6 MIL/uL — ABNORMAL LOW (ref 3.87–5.11)
RDW: 13.4 % (ref 11.5–15.5)
WBC: 5.8 10*3/uL (ref 4.0–10.5)
nRBC: 0.3 % — ABNORMAL HIGH (ref 0.0–0.2)

## 2023-03-09 LAB — BASIC METABOLIC PANEL
Anion gap: 9 (ref 5–15)
BUN: 22 mg/dL — ABNORMAL HIGH (ref 6–20)
CO2: 30 mmol/L (ref 22–32)
Calcium: 8.4 mg/dL — ABNORMAL LOW (ref 8.9–10.3)
Chloride: 99 mmol/L (ref 98–111)
Creatinine, Ser: 0.95 mg/dL (ref 0.44–1.00)
GFR, Estimated: >60 mL/min (ref >60)
Glucose, Bld: 123 mg/dL — ABNORMAL HIGH (ref 70–99)
Potassium: 4.2 mmol/L (ref 3.5–5.1)
Sodium: 138 mmol/L (ref 135–145)

## 2023-03-09 MED ORDER — ENOXAPARIN SODIUM 30 MG/0.3ML IJ SOSY
30.0000 mg | PREFILLED_SYRINGE | INTRAMUSCULAR | Status: DC
  Administered 2023-03-09 – 2023-03-17 (×9): 30 mg via SUBCUTANEOUS
  Filled 2023-03-09 (×9): qty 0.3

## 2023-03-09 NOTE — TOC Progression Note (Signed)
Transition of Care [facility]) - Progression Note    Patient Details  Name: Diamond Bowman MRN: [id number] Date of Birth: September 20, 1965  [facility] CM/SW Contact  Diamond Bowman, [place] Phone Number: 03/09/2023, 11:36 AM  Clinical Narrative:     CSW contacted pt's guardian, Diamond Bowman, regarding decision for SNF placement. She stated she wanted the care giver to assist with making decision and had not discussed final decision with the caregiver.   CSW reached out to the caregiver, Diamond Bowman, there was no answer. CSW left voicemail with contact information to reach back out ot CSW.   TOC will continue to follow.    Expected Discharge Plan: Skilled Nursing Facility Barriers to Discharge: English as a second language teacher, SNF Pending bed offer  Expected Discharge Plan and Services In-house Referral: Clinical Social Work     Living arrangements for the past 2 months: Single Family Home                                       Social Determinants of Health (SDOH) Interventions SDOH Screenings   Food Insecurity: Patient Unable To Answer (03/03/2023)  Housing: Patient Unable To Answer (03/03/2023)  Transportation Needs: Patient Unable To Answer (03/03/2023)  Utilities: Patient Unable To Answer (03/03/2023)  Tobacco Use: Medium Risk (03/03/2023)    Readmission Risk Interventions     No data to display

## 2023-03-09 NOTE — Progress Notes (Signed)
Orthopaedic Trauma Service Progress Note  Patient ID: Diamond Bowman MRN: [id number] DOB/AGE: 1966/03/03 57 y.o.  Subjective:  Ortho issues stable Does not appear to be in any discomfort She was sleeping when I entered room this am    ROS As above  Objective:   VITALS:   Vitals:   03/08/23 2008 03/08/23 2300 03/09/23 0500 03/09/23 0728  BP: (!) 116/57 131/64 136/84 (!) 112/58  Pulse: 90 82 88 92  Resp: 17  18 18   Temp: 99.2 F (37.3 C)  99 F (37.2 C) 98.1 F (36.7 C)  TempSrc: Oral  Oral Oral  SpO2: 92% 97% 96% 94%  Weight:      Height:        Estimated body mass index is 22.33 kg/m as calculated from the following:   Height as of this encounter: 4\' 6"  (1.372 m).   Weight as of this encounter: 42 kg.   Intake/Output      10/14 0701 10/15 0700 10/15 0701 10/16 0700   P.O. 120    Total Intake(mL/kg) 120 (2.9)    Net +120         Urine Occurrence 1 x    Stool Occurrence 1 x    Emesis Occurrence 0 x      LABS  No results found for this or any previous visit (from the past 24 hour(s)).   PHYSICAL EXAM:   Gen: sleeping, NAD  Lungs: unlabored Ext:       Left Lower Extremity  Dressing/splint is clean, dry and intact  Splint is fitting well   No areas of excessive pressure noted. no pressure sores noted as well             Extremity is warm         Good perfusion distally              + DP pulse  Assessment/Plan: 5 Days Post-Op   Principal Problem:   Left trimalleolar fracture, closed, initial encounter   Anti-infectives (From admission, onward)    Start     Dose/Rate Route Frequency Ordered Stop   03/04/23 1615  ceFAZolin (ANCEF) IVPB 2g/100 mL premix        2 g 200 mL/hr over 30 Minutes Intravenous Every 8 hours 03/04/23 1519 03/05/23 0624   03/04/23 0931  ceFAZolin (ANCEF) 2-4 GM/100ML-% IVPB  Status:  Discontinued       Note to Pharmacy: [facility], [place]: cabinet  override      03/04/23 0931 03/04/23 1044   03/04/23 0915  ceFAZolin (ANCEF) IVPB 2g/100 mL premix        2 g 200 mL/hr over 30 Minutes Intravenous On call to O.R. 03/04/23 0815 03/04/23 1145     .  POD/HD#: 59  57 y/o female with closed left ankle fracture s/p ORIF   Weightbearing: ideally she should be nonweightbearing on her left leg however this will be challenging due to her intellectual disability   Depending on when she discharges may convert her to short leg cast prior to dc and then follow up in 10-14 days.  Will likely keep her splinted for 4-6 weeks    ROM: No ankle motion as she is splinted Insicional and dressing care: Dressings left intact until follow-up Pain management: Multimodal ID: periop abx  Impediments  to Fracture Healing: osteoporosis, fragility fracture, atypical antipsychotic med use  Bone Health/Optimization: continue as per PCP   Orthopedic device(s): Splint   VTE prophylaxis: currently on asa, continue this    Dispo: ortho issues stable   Follow - up plan: 1 week   Contact information:  Myrene Galas MD, Montez Morita PA-C   Mearl Latin, PA-C [phone] (C) 03/09/2023, 10:10 AM  [facility] [address] [place] [address] [phone] Val [phone] (F)    After 5pm and on the weekends please log on to Amion, go to orthopaedics and the look under the [facility] Call for the provider(s) on call. You can also call our office at [phone] and then follow the prompts to be connected to the call team.  Patient ID: Diamond Bowman, female   DOB: June 06, 1965, 57 y.o.   MRN: [id number]

## 2023-03-09 NOTE — Plan of Care (Signed)
Problem: Education: Goal: Knowledge of General Education information will improve Description: Including pain rating scale, medication(s)/side effects and non-pharmacologic comfort measures Outcome: Progressing Pt is confused and is A/O to self and place.  She is cognitively impaired.  Pt does not understands why she was admitted into the hospital.  Pt admitted from the ED for LLE pain s/p mech trip and fall where she sustained a left trimalleolar fx.  She had an ORIF of her LLE on 03/04/2023.  Pt is awaiting d/c to rehab facility.    Problem: Clinical Measurements: Goal: Will remain free from infection Outcome: Progressing S/Sx of infection monitored and assessed q-shift.  Pt has remained afebrile thus far.     Problem: Clinical Measurements: Goal: Respiratory complications will improve Outcome: Progressing Respiratory status monitored and assessed q-shift.  Pt is on room air with PO2 at 94-96% and respiration rate of 18 breaths per minute.  Pt has not endorsed c/o SOB or DOE.    Problem: Clinical Measurements: Goal: Cardiovascular complication will be avoided Outcome: Progressing Pt's VS WNL.     Problem: Activity: Goal: Risk for activity intolerance will decrease Outcome: Progressing Pt is dependent of all her ADLs.  She cannot get up OOB w/o the assistance of RN staff.   Problem: Nutrition: Goal: Adequate nutrition will be maintained Outcome: Progressing Pt is on a regular diet per MD's orders and can tolerate it w/o s/sx of abdominal pain/ distention or n/v.  Today she is withdrawn and has shown no interest in eating.  Medical team is aware.    Problem: Safety: Goal: Ability to remain free from injury will improve Outcome: Progressing Pt has remained free from falls thus far.  Instructed pt to utilize RN call light for assistance.  Hourly rounds performed.  Bed alarm implemented to keep pt safe from falls.  Settings activated to third most sensitive mode.  Bed in lowest  position, locked with two upper side rails engaged.  Belongings and call light within reach.    Problem: Skin Integrity: Goal: Risk for impaired skin integrity will decrease Outcome: Progressing Skin integrity monitored and assessed q-shift.  Pt is on q2 hourly turns to prevent further skin impairment.  Tubes and drains assessed for device related pressures sores.  Pt is incontinent of both bowel and bladder.  She is checked q2 hours for bowel incontinence.  Perineal care given promptly after each episode.  Pt has an external female catheter (purewick) in place to capture her urine. Management of external female catheter (purewick) performed per [facility] policy procedure and guidelines.

## 2023-03-09 NOTE — Plan of Care (Signed)
  Problem: Nutrition: Goal: Adequate nutrition will be maintained Outcome: Progressing   Problem: Elimination: Goal: Will not experience complications related to bowel motility Outcome: Progressing   Problem: Safety: Goal: Ability to remain free from injury will improve Outcome: Progressing   Problem: Skin Integrity: Goal: Risk for impaired skin integrity will decrease Outcome: Progressing   

## 2023-03-09 NOTE — Progress Notes (Signed)
Summary: Diamond Bowman is a 57 y.o. F with PMH bipolar 2 disorder, schizophrenia, intellectual disability, osteoporosis, CKD stage 3, heart murmur, hypothyroidism, osteoporosis, seizures who presents after a mechanical fall at home 10/08 resulting in left ankle pain limiting mobility.   Subjective:  No overnight events. Patient sleeping in bed, easily woken up with verbal and tactile prompts. Patient was less vocal than previous exams but was responding accurately to simple questions regarding comfort. Patient did not indicate any pain. Per [name], the patient's nurse, patient has been making less requests today than usual. Did indicate that she wanted more coffee and accepted chocolate ice cream when it was offered. Did not appear to be in any acute distress.   Objective:  Vital signs in last 24 hours: afebrile, normotensive, satting well on RA.  Vitals:   03/08/23 2008 03/08/23 2300 03/09/23 0500 03/09/23 0728  BP: (!) 116/57 131/64 136/84 (!) 112/58  Pulse: 90 82 88 92  Resp: 17  18 18   Temp: 99.2 F (37.3 C)  99 F (37.2 C) 98.1 F (36.7 C)  TempSrc: Oral  Oral Oral  SpO2: 92% 97% 96% 94%  Weight:      Height:       Lab holiday 10/14, labs for 10/15 in process  Physical Exam Constitutional: Patient resting in bed in no acute distress.  Pulmonary/Respiratory: Normal respiratory effort on room air.  Neuro: Alert, answering simple yes/no and what questions.   MSK: Left foot in wrap and splint. No blood or drainage observed. Patient able to lift leg up with verbal prompt.   Assessment/Plan:  Principal Problem:   Left trimalleolar fracture, closed, initial encounter  L trimalleolar fracture, closed Osteoporosis Pt is status post ORIF of L ankle performed 10/10 by Dr. Carola Frost. Difficult to gauge pain based on patient's ability to express herself. Eating and drinking ok, does not appear to be in distress in bed. Not endorsing any pain or soreness today. Has been receiving  Tylenol and Oxy PRN for pain.   Will be discharged to SNF/acute rehab, appreciate TOC/SW help with this. PT/OT to continue following. Will monitor pain management as she awaits placement. Will follow up on labs for today. Of note, if patient still here by Thursday will have orthopedics change to short cast.   Plan: -Pain management: tylenol 650 mg q6h PRN mild pain, oxycodone 5 mg q4h PRN moderate pain, dilaudid 0.5-1 mg q2h PRN severe pain -Bowel regimen: senna 8.6 mg BID -F/u orthopedic surgery recommendations: non-weight bearing for next 3-5 days, convert to short leg cast in 1 week, OP follow up for repeat imaging in 10 days -Continue home alendronate 70 mg weekly Thursday), calcium carbonate-cholecalciferol 500-15 mg-mcg daily, MV one tablet daily   Elevated BP Intermittently elevated, may be due to pain or discomfort. Will continue to monitor.   Intellectual disability Bipolar 2 disorder Schizophrenia Insomnia Chronic. Will continue home regimen here. Plan: -Continue home olanzapine 15 mg daily at bedtime, trazodone 300 mg daily at bedtime, mirtazapine 15 mg daily at bedtime, cyproheptadine 4 mg daily at bedtime, hydroxyzine 50 mg BID   Seizure disorder Chronic. Will monitor.  -Continue home depakote 1500 mg daily at bedtime.   Hypothyroidism Chronic. TSH last checked in 2023 was normal. TSH 10/12 2.791 WNL.  Plan: -Continue home levothyroxine 75 mcg daily.   Hx atrial fibrillation on aspirin Chronic. EKG on admission showing NSR. Will monitor.  -Continue home metoprolol 50 mg daily.   Hx iron deficiency anemia Chronic. CBC  shows stable anemia. Iron level checked at 17, Tsat 7 %. Ferritin 269 consistent with iron deficiency anemia. Will add B12 and folate given her anemia is microcytic despite iron deficiency.  - Trend CBC, f/u on today's labs.  - Continue home ferrous sulfate.  Thrombocytopenia Chronic. Will trend on labs, f/u platelet count from today. Will transfuse  as needed.   Prior to Admission Living Arrangement: Home Anticipated Discharge Location: SNF Barriers to Discharge: Medical stability Dispo: Anticipated discharge in approximately 1-2 day(s).   Faythe Heitzenrater Colbert Coyer, MD Internal Medicine Teaching Service, PGY-1 Please contact the on call pager after 5 pm and on weekends at [phone].

## 2023-03-09 NOTE — TOC Progression Note (Addendum)
Transition of Care [facility]) - Progression Note    Patient Details  Name: Diamond Bowman MRN: [id number] Date of Birth: Oct 10, 1965  Transition of Care [facility]) CM/SW Contact  Erroll Wilbourne A [place], [place] Phone Number: 03/09/2023, 2:42 PM  Clinical Narrative:     Update 10/15 1552 CSW was notified by [facility] that there are currently no beds available at [facility] or [facility]. Bed option available at [facility]. CSW contacted pt's care giver, who guardian stated she wanted to make the final decision on placement. She currently declined bed offer to [facility]. CSW to send out referral to other possible facilities in effort to find placement.   CSW was contacted by pt's care giver Diamond Bowman, she chose [facility] for SNF.   CSW reached out to facility to confirm bed availability and request to start insurance authorization as pt is medically stable.   TOC will continue to follow.    Expected Discharge Plan: Skilled Nursing Facility Barriers to Discharge: English as a second language teacher, SNF Pending bed offer  Expected Discharge Plan and Services In-house Referral: Clinical Social Work     Living arrangements for the past 2 months: Single Family Home                                       Social Determinants of Health (SDOH) Interventions SDOH Screenings   Food Insecurity: Patient Unable To Answer (03/03/2023)  Housing: Patient Unable To Answer (03/03/2023)  Transportation Needs: Patient Unable To Answer (03/03/2023)  Utilities: Patient Unable To Answer (03/03/2023)  Tobacco Use: Medium Risk (03/03/2023)    Readmission Risk Interventions     No data to display

## 2023-03-09 NOTE — Progress Notes (Signed)
Occupational Therapy Treatment Patient Details Name: Diamond Bowman MRN: [id number] DOB: 1965-12-06 Today's Date: 03/09/2023   History of present illness Pt is 57 year old presented to [facility] hospital on  03/03/23 for fall with left leg pain, found to have L ankle trimalleolar fx. Pt underwent ORIF on 10/10. PMH - MDD, BiPolar, Schizophrenia, Mental Retardation, tardive dyskinesia, CKD 3, hypothyroidism   OT comments  Pt alert but not responded upon entry, after a few minutes Pt able to sit up in bed with max encouragement, refused to eat any of her food. Pt agreeable to sit on EOB and transfer to recliner. Pt impulsive to stand, not able to follow verbal cues to wait for assistance and not able to follow NWB precautions for LLE after repeated reminders. Pt was able to transfer to recliner with HHA but took one full step on LLE, attempted to take weight off with use of gait belt. Pt refused use of RW. Pt left in chair with chair alarm and call bell, Pt not able to take sips of her drinks with straw due to severity of tremors, required increased assistance for feeding today. Pt DC plan still appropriate, to be seen acutely to progress as able.       If plan is discharge home, recommend the following:  Two people to help with walking and/or transfers;Two people to help with bathing/dressing/bathroom;Direct supervision/assist for financial management;Supervision due to cognitive status;Direct supervision/assist for medications management   Equipment Recommendations  Wheelchair (measurements OT);Wheelchair cushion (measurements OT);Other (comment);Hoyer lift;BSC/3in1    Recommendations for Other Services      Precautions / Restrictions Precautions Precautions: Fall Required Braces or Orthoses: Splint/Cast Restrictions Weight Bearing Restrictions: Yes LLE Weight Bearing: Non weight bearing       Mobility Bed Mobility Overal bed mobility: Needs Assistance Bed Mobility: Supine to Sit      Supine to sit: Min assist, HOB elevated, Used rails     General bed mobility comments: min A to EOB    Transfers Overall transfer level: Needs assistance Equipment used: 1 person hand held assist Transfers: Sit to/from Stand, Bed to chair/wheelchair/BSC Sit to Stand: Min assist     Step pivot transfers: Min assist     General transfer comment: Pt min A for power to STS and transfer, not able to follow NWB precautions at all, refuses to use RW, impulsive to use HHA and take steps to recliner despite verbal cueing.     Balance Overall balance assessment: Needs assistance Sitting-balance support: No upper extremity supported, Feet supported Sitting balance-Leahy Scale: Fair Sitting balance - Comments: EOB, recliner   Standing balance support: Single extremity supported, During functional activity Standing balance-Leahy Scale: Fair Standing balance comment: fair standing balance but not able to maintain NWB precautions or use DME safely.                           ADL either performed or assessed with clinical judgement   ADL Overall ADL's : Needs assistance/impaired Eating/Feeding: Minimal assistance;Sitting               Upper Body Dressing : Minimal assistance;Sitting       Toilet Transfer: Moderate assistance;Stand-pivot             General ADL Comments: Pt min A for feeding, tremors affecting participation today. Pt impulsive to stand, not able to follow NWB precautions after repeated reminders. min A for dressing in gown    Extremity/Trunk  Assessment Upper Extremity Assessment Upper Extremity Assessment: Generalized weakness            Vision       Perception     Praxis      Cognition Arousal: Obtunded Behavior During Therapy: Flat affect Overall Cognitive Status: History of cognitive impairments - at baseline                                 General Comments: Pt only replied occassionally, not able to follow cues  consistently, impulsive to stand, not able to follow NWB precautions, decreased ability to communicate needs due to poor clarity of speech.        Exercises      Shoulder Instructions       General Comments      Pertinent Vitals/ Pain       Pain Assessment Pain Assessment: No/denies pain  Home Living                                          Prior Functioning/Environment              Frequency  Min 1X/week        Progress Toward Goals  OT Goals(current goals can now be found in the care plan section)  Progress towards OT goals: Progressing toward goals  Acute Rehab OT Goals OT Goal Formulation: Patient unable to participate in goal setting Time For Goal Achievement: 03/17/23 Potential to Achieve Goals: Good ADL Goals Pt Will Perform Grooming: sitting;with min assist Pt Will Transfer to Toilet: with min assist;stand pivot transfer;bedside commode Additional ADL Goal #1: pt will demonstrate basic transfer with weight bearing precautions 100% using RW Additional ADL Goal #2: pt will demonstrate basic transfer to w/c min (A) Additional ADL Goal #3: pt will tolerate 5 mins EOB sitting with CGA to promote confidence and in preparation to complete seated ADLs  Plan      Co-evaluation                 AM-PAC OT "6 Clicks" Daily Activity     Outcome Measure   Help from another person eating meals?: A Little Help from another person taking care of personal grooming?: A Little Help from another person toileting, which includes using toliet, bedpan, or urinal?: A Lot Help from another person bathing (including washing, rinsing, drying)?: A Lot Help from another person to put on and taking off regular upper body clothing?: A Little Help from another person to put on and taking off regular lower body clothing?: A Lot 6 Click Score: 15    End of Session Equipment Utilized During Treatment: Gait belt  OT Visit Diagnosis: Unsteadiness on feet  (R26.81);Muscle weakness (generalized) (M62.81)   Activity Tolerance Patient tolerated treatment well   Patient Left in chair;with call bell/phone within reach;with chair alarm set   Nurse Communication Mobility status        Time: 1340-1400 OT Time Calculation (min): 20 min  Charges: OT General Charges $OT Visit: 1 Visit OT Treatments $Therapeutic Activity: 8-22 mins  [address] OTR/L   Alexis Goodell 03/09/2023, 2:16 PM

## 2023-03-10 DIAGNOSIS — S82852A Displaced trimalleolar fracture of left lower leg, initial encounter for closed fracture: Secondary | ICD-10-CM | POA: Diagnosis not present

## 2023-03-10 DIAGNOSIS — M810 Age-related osteoporosis without current pathological fracture: Secondary | ICD-10-CM | POA: Diagnosis not present

## 2023-03-10 LAB — CBC
HCT: 25.2 % — ABNORMAL LOW (ref 36.0–46.0)
Hemoglobin: 8.4 g/dL — ABNORMAL LOW (ref 12.0–15.0)
MCH: 33.2 pg (ref 26.0–34.0)
MCHC: 33.3 g/dL (ref 30.0–36.0)
MCV: 99.6 fL (ref 80.0–100.0)
Platelets: 95 10*3/uL — ABNORMAL LOW (ref 150–400)
RBC: 2.53 MIL/uL — ABNORMAL LOW (ref 3.87–5.11)
RDW: 13.8 % (ref 11.5–15.5)
WBC: 9.7 10*3/uL (ref 4.0–10.5)
nRBC: 0.3 % — ABNORMAL HIGH (ref 0.0–0.2)

## 2023-03-10 MED ORDER — ACETAMINOPHEN 650 MG RE SUPP: 650.0000 mg | Freq: Four times a day (QID) | RECTAL | Status: DC

## 2023-03-10 MED ORDER — ACETAMINOPHEN 325 MG PO TABS
650.0000 mg | ORAL_TABLET | Freq: Four times a day (QID) | ORAL | Status: DC
  Administered 2023-03-10 – 2023-03-17 (×16): 650 mg via ORAL
  Filled 2023-03-10 (×18): qty 2

## 2023-03-10 NOTE — Plan of Care (Signed)
Patient alert/oriented X2. Patient oriented to place and self. Patient complained of increased  pain in L leg and compliant with medication administration. Patient ate 100% of her meals . Cast remains intact on L leg and VSS. Will continue to monitor.   Problem: Education: Goal: Knowledge of General Education information will improve Description: Including pain rating scale, medication(s)/side effects and non-pharmacologic comfort measures Outcome: Progressing   Problem: Health Behavior/Discharge Planning: Goal: Ability to manage health-related needs will improve Outcome: Progressing   Problem: Clinical Measurements: Goal: Ability to maintain clinical measurements within normal limits will improve Outcome: Progressing   Problem: Clinical Measurements: Goal: Will remain free from infection Outcome: Progressing   Problem: Clinical Measurements: Goal: Diagnostic test results will improve Outcome: Progressing   Problem: Clinical Measurements: Goal: Respiratory complications will improve Outcome: Progressing   Problem: Clinical Measurements: Goal: Cardiovascular complication will be avoided Outcome: Progressing   Problem: Activity: Goal: Risk for activity intolerance will decrease Outcome: Progressing   Problem: Nutrition: Goal: Adequate nutrition will be maintained Outcome: Progressing   Problem: Coping: Goal: Level of anxiety will decrease Outcome: Progressing   Problem: Elimination: Goal: Will not experience complications related to bowel motility Outcome: Progressing   Problem: Elimination: Goal: Will not experience complications related to urinary retention Outcome: Progressing   Problem: Pain Managment: Goal: General experience of comfort will improve Outcome: Progressing   Problem: Safety: Goal: Ability to remain free from injury will improve Outcome: Progressing   Problem: Skin Integrity: Goal: Risk for impaired skin integrity will decrease Outcome:  Progressing

## 2023-03-10 NOTE — Progress Notes (Addendum)
Summary: Diamond Bowman is a 57 y.o. F with PMH bipolar 2 disorder, schizophrenia, intellectual disability, osteoporosis, CKD stage 3, heart murmur, hypothyroidism, osteoporosis, seizures who presents after a mechanical fall at home 10/08 resulting in left ankle pain limiting mobility.   Subjective:  No overnight events reported. Patient is more interactive this morning compared to yesterday. She is making spontaneous comments and requests. Patient giving inconsistent answers to pain/soreness/discomfort of lower left extremity. Did not indicate any other acute pain or concern at this time.   Objective:  Vital signs in last 24 hours: afebrile, normotensive, satting well on RA.  Vitals:   03/09/23 1608 03/09/23 1900 03/10/23 0500 03/10/23 0747  BP: (!) 146/80 120/72 (!) 147/98 (!) 120/56  Pulse: 94 95 89 95  Resp:  18 19 16   Temp: 98 F (36.7 C) 98.4 F (36.9 C) 97.6 F (36.4 C) 98.7 F (37.1 C)  TempSrc:  Oral Oral Oral  SpO2: 98% 96% 92% 95%  Weight:      Height:          Latest Ref Rng & Units 03/10/2023    8:26 AM 03/09/2023   12:41 PM 03/07/2023    8:42 AM  CBC  WBC 4.0 - 10.5 K/uL 9.7  5.8  6.9   Hemoglobin 12.0 - 15.0 g/dL 8.4  8.6  8.8   Hematocrit 36.0 - 46.0 % 25.2  25.7  26.4   Platelets 150 - 400 K/uL 95  PLATELET CLUMPS NOTED ON SMEAR, UNABLE TO ESTIMATE  59        Latest Ref Rng & Units 03/09/2023   12:41 PM 03/06/2023    6:07 AM 03/05/2023    9:55 AM  BMP  Glucose 70 - 99 mg/dL 161  096  045   BUN 6 - 20 mg/dL 22  9  12    Creatinine 0.44 - 1.00 mg/dL 4.09  8.11  9.14   Sodium 135 - 145 mmol/L 138  142  144   Potassium 3.5 - 5.1 mmol/L 4.2  4.9  4.0   Chloride 98 - 111 mmol/L 99  102  100   CO2 22 - 32 mmol/L 30  31  31    Calcium 8.9 - 10.3 mg/dL 8.4  8.9  8.5      Physical Exam Constitutional: Patient resting in bed in no acute distress.  CV: RRR, no r/m/g, no LE edema.  Pulmonary/Respiratory: Normal respiratory effort on room air.    Abdominal: Soft, non-tender, non-distended.  Neuro: Alert, following simple verbal prompts and able to verbally answer simple questions.   MSK: Moving both lower extremities spontaneously when prompted. Left foot in wrap and splint, did not wiggle toes when prompted but able to sense touch when asked. No blood or drainage observed.   Assessment/Plan:  Principal Problem:   Left trimalleolar fracture, closed, initial encounter  L trimalleolar fracture, closed Osteoporosis Pt is status post ORIF of L ankle performed 10/10 by Dr. Carola Frost. Continues to be difficult to determine if patient is in pain as she sometimes indicates that she has discomfort and other times changes topic to make a comment or request. Eating and drinking ok.   Will be discharged to SNF/acute rehab, appreciate TOC/SW help with this. PT/OT to continue following. Per ortho recommendations patient should be weight bearing now. If patient remains here by Thursday will reach out to have ortho convert patient's splint to a short leg cast.   Plan: - Scheduled Tylenol 650 mg q6H. Continue oxycodone  5 mg q4h PRN moderate pain, dilaudid 0.5-1 mg q2h PRN severe pain -Bowel regimen: senna BID -F/u orthopedic surgery recommendations: non-weight bearing for next 3-5 days, convert to short leg cast in 1 week, OP follow up for repeat imaging in 10 days -Continue home alendronate 70 mg weekly Thursday), calcium carbonate-cholecalciferol 500-15 mg-mcg daily, MV one tablet daily   Elevated BP BP continue to be intermittently elevated, last BP 120/56. May be intermittently elevated due to pain or discomfort. Continue to monitor.   Intellectual disability Bipolar 2 disorder Schizophrenia Insomnia Chronic. Will continue home regimen here. Plan: -Continue home olanzapine 15 mg daily at bedtime, trazodone 300 mg daily at bedtime, mirtazapine 15 mg daily at bedtime, cyproheptadine 4 mg daily at bedtime, hydroxyzine 50 mg BID   Seizure  disorder Chronic. Will monitor.  -Continue home depakote 1500 mg daily at bedtime.   Hypothyroidism Chronic. TSH last checked in 2023 was normal. TSH 10/12 2.791 WNL.  Plan: -Continue home levothyroxine 75 mcg daily.   Hx atrial fibrillation on aspirin Chronic. EKG on admission showing NSR. Will monitor.  -Continue home metoprolol 50 mg daily.   Hx iron deficiency anemia Chronic. CBC shows stable anemia. Iron level checked at 17, Tsat 7 %. Ferritin 269 consistent with iron deficiency anemia. Will add B12 and folate given her anemia is microcytic despite iron deficiency. Hgb today 8.4. - Monitor labs   - Continue home ferrous sulfate.  Thrombocytopenia Chronic. Will trend on labs, 95 K/uL today. No indication for transfusion.  Prior to Admission Living Arrangement: Home Anticipated Discharge Location: SNF Barriers to Discharge: Medical stability Dispo: Anticipated discharge in approximately 1-2 day(s).   [name], MD Internal Medicine Teaching Service, PGY-1 Please contact the on call pager after 5 pm and on weekends at [phone].

## 2023-03-10 NOTE — TOC Progression Note (Addendum)
Transition of Care [facility]) - Progression Note    Patient Details  Name: Diamond Bowman MRN: [id number] Date of Birth: 1965/10/09  Transition of Care [facility]) CM/SW Contact  Ziara Thelander A [place], [place] Phone Number: 03/10/2023, 3:09 PM  Clinical Narrative:     Update 1632 CSW was contacted by [name], she stated that she was leaning towards [facility] and would let CSW know decision tomorrow after reviewing facility.   CSW contacted pt's care giver, [name], to update her on bed offers, with permission from guardian to continue to have care giver lead with decision for SNF placement. CSW left voicemail with contact information to reach back out to CSW.   Possible bed offer a bed at [facility], an affiliated facility with [facility] and [facility]. CSW will follow up with facility and care giver for permission to send referral and possible acceptance of bed offer.   TOC will continue to follow.   Expected Discharge Plan: Skilled Nursing Facility Barriers to Discharge: English as a second language teacher, SNF Pending bed offer  Expected Discharge Plan and Services In-house Referral: Clinical Social Work     Living arrangements for the past 2 months: Single Family Home                                       Social Determinants of Health (SDOH) Interventions SDOH Screenings   Food Insecurity: Patient Unable To Answer (03/03/2023)  Housing: Patient Unable To Answer (03/03/2023)  Transportation Needs: Patient Unable To Answer (03/03/2023)  Utilities: Patient Unable To Answer (03/03/2023)  Tobacco Use: Medium Risk (03/03/2023)    Readmission Risk Interventions     No data to display

## 2023-03-11 ENCOUNTER — Encounter (HOSPITAL_COMMUNITY): Payer: Self-pay | Admitting: Orthopedic Surgery

## 2023-03-11 DIAGNOSIS — S82852A Displaced trimalleolar fracture of left lower leg, initial encounter for closed fracture: Secondary | ICD-10-CM | POA: Diagnosis not present

## 2023-03-11 DIAGNOSIS — M810 Age-related osteoporosis without current pathological fracture: Secondary | ICD-10-CM | POA: Diagnosis not present

## 2023-03-11 MED ORDER — ALENDRONATE SODIUM 70 MG PO TABS
70.0000 mg | ORAL_TABLET | Freq: Every day | ORAL | Status: DC
  Administered 2023-03-11: 70 mg via ORAL

## 2023-03-11 MED ORDER — INFLUENZA VIRUS VACC SPLIT PF (FLUZONE) 0.5 ML IM SUSY
0.5000 mL | PREFILLED_SYRINGE | INTRAMUSCULAR | Status: AC
  Administered 2023-03-12: 0.5 mL via INTRAMUSCULAR
  Filled 2023-03-11: qty 0.5

## 2023-03-11 MED ORDER — ALENDRONATE SODIUM 70 MG PO TABS
70.0000 mg | ORAL_TABLET | Freq: Every day | ORAL | Status: DC
  Filled 2023-03-11: qty 7
  Filled 2023-03-11: qty 1

## 2023-03-11 NOTE — Progress Notes (Signed)
Orthopedic Tech Progress Note Patient Details:  Diamond Bowman [birth year] [id number]  Casting Type of Cast: Short leg cast Cast Location: LLE Cast Material: Fiberglass Cast Intervention: Application  Post Interventions Patient Tolerated: Well Instructions Provided: Care of device, Adjustment of device   Tonye Pearson 03/11/2023, 12:55 PM

## 2023-03-11 NOTE — Progress Notes (Signed)
Orthopedic Tech Progress Note Patient Details:  YARISA LYNAM 11/05/1965 [id number]  Ortho Devices Type of Ortho Device: Stirrup splint, Short leg splint Splint Material: Fiberglass Ortho Device/Splint Location: LLE Ortho Device/Splint Interventions: Removal   Post Interventions Patient Tolerated: Well Instructions Provided: Care of device, Adjustment of device  Tonye Pearson 03/11/2023, 12:59 PM

## 2023-03-11 NOTE — Progress Notes (Deleted)
Orthopedic Tech Progress Note Patient Details:  MALEIAH DULA 03-09-1966 [id number]  Ortho Devices Type of Ortho Device: Post (short) splint, Stirrup splint Splint Material: Fiberglass Ortho Device/Splint Location: LLE Ortho Device/Splint Interventions: Ordered, Application   Post Interventions Patient Tolerated: Well Instructions Provided: Care of device, Adjustment of device  Tonye Pearson 03/11/2023, 12:52 PM

## 2023-03-11 NOTE — Progress Notes (Addendum)
Summary: Diamond Bowman. Scobey is a 57 y.o. F with PMH bipolar 2 disorder, schizophrenia, intellectual disability, osteoporosis, CKD stage 3, heart murmur, hypothyroidism, osteoporosis, seizures who presents after a mechanical fall at home 10/08 resulting in left ankle pain limiting mobility.   Subjective:  Patient resting comfortably in bed. Patient is not indicating any pain in left lower extremity. She is alert and making comments. Able to answer some simple questions. Made a request to speak with her mother. Ate some of her breakfast. No other complaints. Let patient know orthopedic team would be changing her splint for a short cast today. Asked patient's nurse Gilman Buttner for help calling patient's mother.   Objective:  Vital signs in last 24 hours: afebrile, normotensive, satting well on RA.  Vitals:   03/10/23 1631 03/10/23 1933 03/11/23 0523 03/11/23 0829  BP: (!) 137/56 (!) 119/23 138/65 (!) 109/93  Pulse: 88 83 77 76  Resp: 18 18 14 17   Temp: 98.2 F (36.8 C) 98.3 F (36.8 C) 98.7 F (37.1 C) 98.3 F (36.8 C)  TempSrc: Oral Oral  Oral  SpO2: 96% 97% 96% 99%  Weight:      Height:          Latest Ref Rng & Units 03/10/2023    8:26 AM 03/09/2023   12:41 PM 03/07/2023    8:42 AM  CBC  WBC 4.0 - 10.5 K/uL 9.7  5.8  6.9   Hemoglobin 12.0 - 15.0 g/dL 8.4  8.6  8.8   Hematocrit 36.0 - 46.0 % 25.2  25.7  26.4   Platelets 150 - 400 K/uL 95  PLATELET CLUMPS NOTED ON SMEAR, UNABLE TO ESTIMATE  57     Lab holiday today 10/17  Physical Exam Constitutional: Patient resting in bed in no acute distress.  Pulmonary/Respiratory: Normal respiratory effort on room air.   Abdominal: Soft, non-tender, non-distended.  Neuro: Following simple prompts and answering simple questions with choices or yes/no questions. No acute deficits observed.  MSK: Moving both lower extremities spontaneously, left foot in wrap and splint, no blood or drainage observed.   Assessment/Plan:  Principal Problem:    Left trimalleolar fracture, closed, initial encounter  L trimalleolar fracture, closed Osteoporosis Pt is status post ORIF of L ankle performed 10/10 by Dr. Carola Frost. Continues to be difficult to determine if patient is in pain as she sometimes indicates that she has discomfort and other times changes topic to make a comment or request. Still eating and drinking ok.   Will be discharged to SNF/acute rehab, appreciate TOC/SW help with this. PT/OT to continue following. Per ortho recommendations patient can be weight bearing now. Ortho to convert patient's splint to a short leg cast.   Plan: - Scheduled Tylenol 650 mg q6H. Continue oxycodone 5 mg q4h PRN moderate pain.  -Bowel regimen: senna BID -F/u orthopedic surgery recommendations: patient can now be weight-bearing; discussed with orthopedics team, agreed on converting short leg cast today; OP follow up for repeat imaging in 10 days post surgery.  -Patient given home alendronate 70 mg which she takes weekly at home with precautions including taking on empty stomach with water and remaining seated up 30 minutes after.  - Continue home calcium carbonate-cholecalciferol 500-15 mg-mcg daily, MV one tablet daily   Elevated BP BP continue to be intermittently elevated, last BP 109/93. May be intermittently elevated due to pain or discomfort. Continue to monitor.   Intellectual disability Bipolar 2 disorder Schizophrenia Insomnia Chronic. Will continue home regimen here. Plan: -Continue  home olanzapine 15 mg daily at bedtime, trazodone 300 mg daily at bedtime, mirtazapine 15 mg daily at bedtime, cyproheptadine 4 mg daily at bedtime, hydroxyzine 50 mg BID   Seizure disorder Chronic. Will monitor.  -Continue home depakote 1500 mg daily at bedtime.   Hypothyroidism Chronic. TSH last checked in 2023 was normal. TSH 10/12 2.791 WNL.  Plan: -Continue home levothyroxine 75 mcg daily.   Hx atrial fibrillation on aspirin Chronic. EKG on admission  showing NSR. Will monitor.  -Continue home metoprolol 50 mg daily.   Hx iron deficiency anemia Chronic. CBC shows stable anemia. Iron level checked at 17, Tsat 7 %. Ferritin 269 consistent with iron deficiency anemia. Will add B12 and folate given her anemia is microcytic despite iron deficiency. Hgb today 8.4. - Monitor labs   - Continue home ferrous sulfate.  Thrombocytopenia Chronic. Will trend on labs, 95 K/uL yesterday. No indication for transfusion.  Prior to Admission Living Arrangement: Home Anticipated Discharge Location: SNF Barriers to Discharge: Medical stability Dispo: Anticipated discharge in approximately 1-2 day(s).   Shatiqua Heroux Colbert Coyer, MD Internal Medicine Teaching Service, PGY-1 Please contact the on call pager after 5 pm and on weekends at [phone].

## 2023-03-11 NOTE — Anesthesia Postprocedure Evaluation (Signed)
Anesthesia Post Note  Patient: Diamond Bowman  Procedure(s) Performed: OPEN REDUCTION INTERNAL FIXATION (ORIF) ANKLE FRACTURE (Left: Ankle)     Patient location during evaluation: PACU Anesthesia Type: General Level of consciousness: awake and alert Pain management: pain level controlled Vital Signs Assessment: post-procedure vital signs reviewed and stable Respiratory status: spontaneous breathing, nonlabored ventilation and respiratory function stable Cardiovascular status: blood pressure returned to baseline and stable Postop Assessment: no apparent nausea or vomiting Anesthetic complications: no   No notable events documented.  Last Vitals:  Vitals:   03/11/23 0523 03/11/23 0829  BP: 138/65 (!) 109/93  Pulse: 77 76  Resp: 14 17  Temp: 37.1 C 36.8 C  SpO2: 96% 99%    Last Pain:  Vitals:   03/11/23 0829  TempSrc: Oral  PainSc:                  Marquez Ceesay

## 2023-03-11 NOTE — Progress Notes (Signed)
Occupational Therapy Treatment Patient Details Name: Diamond Bowman MRN: [id number] DOB: 11/15/1965 Today's Date: 03/11/2023   History of present illness Pt is 57 year old presented to [facility] hospital on  03/03/23 for fall with left leg pain, found to have L ankle trimalleolar fx. Pt underwent ORIF on 10/10. PMH - MDD, BiPolar, Schizophrenia, Mental Retardation, tardive dyskinesia, CKD 3, hypothyroidism   OT comments  Pt alert today, actively participated, co-treat with PT to maximize participation and ensure safety with transfers. Pt completed bed mobility with min A to EOB, difficulty following commands and problem solving to sit up without assistance. Pt instructed on precautions and use of RW, mod A to stand while maintaining precautions, although when transferring Pt does not maintain precautions and WBs through LLE. Pt mod A for LB ADLs, difficulty sequencing and problem solving to complete task without physical assistance.  Pt would benefit from continued acute therapy to maximize participation, DC plan still appropriate.       If plan is discharge home, recommend the following:  Two people to help with walking and/or transfers;Two people to help with bathing/dressing/bathroom;Direct supervision/assist for financial management;Supervision due to cognitive status;Direct supervision/assist for medications management   Equipment Recommendations  Wheelchair (measurements OT);Wheelchair cushion (measurements OT);Other (comment);Hoyer lift;BSC/3in1    Recommendations for Other Services      Precautions / Restrictions Precautions Precautions: Fall Required Braces or Orthoses: Splint/Cast Splint/Cast - Date Prophylactic Dressing Applied (if applicable): 03/04/23 Restrictions Weight Bearing Restrictions: Yes LLE Weight Bearing: Non weight bearing       Mobility Bed Mobility Overal bed mobility: Needs Assistance Bed Mobility: Supine to Sit     Supine to sit: Min assist, HOB elevated,  Used rails     General bed mobility comments: min A to EOB    Transfers Overall transfer level: Needs assistance Equipment used: Rolling walker (2 wheels) Transfers: Sit to/from Stand, Bed to chair/wheelchair/BSC Sit to Stand: Mod assist     Step pivot transfers: Min assist     General transfer comment: mod A to power STS while maintaining precautions, min A when not as Pt has difficulty following NWB precautions     Balance Overall balance assessment: Needs assistance Sitting-balance support: No upper extremity supported, Feet supported Sitting balance-Leahy Scale: Fair Sitting balance - Comments: EOB, recliner   Standing balance support: Single extremity supported, During functional activity Standing balance-Leahy Scale: Fair Standing balance comment: fair standing balance but not able to maintain NWB precautions or use DME safely.                           ADL either performed or assessed with clinical judgement   ADL Overall ADL's : Needs assistance/impaired Eating/Feeding: Set up;Sitting   Grooming: Set up;Sitting           Upper Body Dressing : Minimal assistance;Sitting   Lower Body Dressing: Moderate assistance;Sit to/from stand   Toilet Transfer: Moderate assistance;Rolling walker (2 wheels);BSC/3in1             General ADL Comments: Pt min-mod for transfer to chair, difficulty maintaining NWB precautions. mod A for LB dressing, donning pants, difficulty sequencing and problem solving    Extremity/Trunk Assessment Upper Extremity Assessment Upper Extremity Assessment: Overall WFL for tasks assessed            Vision       Perception     Praxis      Cognition Arousal: Alert Behavior During Therapy: Flat  affect Overall Cognitive Status: History of cognitive impairments - at baseline                                 General Comments: Pt follows commands inconsistently, fully alert today.        Exercises       Shoulder Instructions       General Comments      Pertinent Vitals/ Pain       Pain Assessment Pain Assessment: Faces Faces Pain Scale: Hurts a little bit Pain Location: L foot Pain Descriptors / Indicators: Grimacing Pain Intervention(s): Monitored during session  Home Living                                          Prior Functioning/Environment              Frequency  Min 1X/week        Progress Toward Goals  OT Goals(current goals can now be found in the care plan section)  Progress towards OT goals: Progressing toward goals  Acute Rehab OT Goals Patient Stated Goal: not able to participate in goal setting OT Goal Formulation: Patient unable to participate in goal setting Time For Goal Achievement: 03/17/23 Potential to Achieve Goals: Good ADL Goals Pt Will Perform Grooming: sitting;with min assist Pt Will Transfer to Toilet: with min assist;stand pivot transfer;bedside commode Additional ADL Goal #1: pt will demonstrate basic transfer with weight bearing precautions 100% using RW Additional ADL Goal #2: pt will demonstrate basic transfer to w/c min (A) Additional ADL Goal #3: pt will tolerate 5 mins EOB sitting with CGA to promote confidence and in preparation to complete seated ADLs  Plan      Co-evaluation    PT/OT/SLP Co-Evaluation/Treatment: Yes Reason for Co-Treatment: Necessary to address cognition/behavior during functional activity;For patient/therapist safety;To address functional/ADL transfers   OT goals addressed during session: ADL's and self-care;Proper use of Adaptive equipment and DME      AM-PAC OT "6 Clicks" Daily Activity     Outcome Measure   Help from another person eating meals?: A Little Help from another person taking care of personal grooming?: A Little Help from another person toileting, which includes using toliet, bedpan, or urinal?: A Lot Help from another person bathing (including washing,  rinsing, drying)?: A Lot Help from another person to put on and taking off regular upper body clothing?: A Little Help from another person to put on and taking off regular lower body clothing?: A Lot 6 Click Score: 15    End of Session Equipment Utilized During Treatment: Gait belt;Rolling walker (2 wheels)  OT Visit Diagnosis: Unsteadiness on feet (R26.81);Muscle weakness (generalized) (M62.81)   Activity Tolerance Patient tolerated treatment well   Patient Left in chair;with call bell/phone within reach;with chair alarm set   Nurse Communication Mobility status        Time: 1610-9604 OT Time Calculation (min): 29 min  Charges: OT General Charges $OT Visit: 1 Visit OT Treatments $Self Care/Home Management : 8-22 mins  [place], OTR/L   Alexis Goodell 03/11/2023, 3:45 PM

## 2023-03-11 NOTE — Progress Notes (Signed)
PHARMACIST - PHYSICIAN COMMUNICATION  CONCERNING: P&T Medication Policy Regarding Oral Bisphosphonates  RECOMMENDATION: Your order for alendronate (Fosamax)as been discontinued at this time.  If the patient's post-hospital medical condition warrants safe use of this class of drugs, please resume the pre-hospital regimen upon discharge.  DESCRIPTION:  Alendronate (Fosamax), can cause severe esophageal erosions in patients who are unable to remain upright at least 30 minutes after taking this medication.   Since brief interruptions in therapy are thought to have minimal impact on bone mineral density, the Pharmacy & Therapeutics Committee has established that bisphosphonate orders should be routinely discontinued during hospitalization.   To override this safety policy and permit administration of  Fosamax, while in the hospital, prescribers must write "DO NOT HOLD" in the comments section when placing the order for this class of medications.  Hulen Luster, PharmD, CPP

## 2023-03-11 NOTE — Plan of Care (Signed)

## 2023-03-11 NOTE — Progress Notes (Signed)
Physical Therapy Treatment Patient Details Name: Diamond Bowman MRN: [id number] DOB: 05-28-1965 Today's Date: 03/11/2023     History of Present Illness Pt is 57 year old presented to [facility] hospital on  03/03/23 for fall with left leg pain, found to have L ankle trimalleolar fx. Pt underwent ORIF on 10/10. PMH - MDD, BiPolar, Schizophrenia, Mental Retardation, tardive dyskinesia, CKD 3, hypothyroidism      PT Comments  Pt received in supine, RN clearance for session and pt agreeable to therapy session. Improved initiation this date for bed mobility but pt still requiring increased time/encouragement to perform. PTA/OT co-treatment due to pt multidisciplinary therapy needs and poor awareness of LLE NWB precautions and safety. Pt needing up to modA +2 for transfers while maintaining LLE NWB (needs assist to prevent WB), and pt noncompliant while taking ~3 pivotal steps to chair on her R side. Pt able to perform lateral scoot along EOB without WB through LLE, plan to trial lateral scoot to drop arm wheelchair next session and WC mobility if pt able to follow commands. Pt continues to benefit from PT services to progress toward functional mobility goals.    PT/OT/SLP Co-Evaluation/Treatment Yes  Reason for Co-Treatment Necessary to address cognition/behavior during functional activity;For patient/therapist safety;To address functional/ADL transfers  PT goals addressed during session Mobility/safety with mobility;Balance;Proper use of DME;Strengthening/ROM  Subjective Data  Patient Stated Goal pt does not state, supervising PT goal to progress to transfer training with NWB LLE  Precautions  Precautions Fall  Precaution Comments intellectual disability  Required Braces or Orthoses Splint/Cast  Splint/Cast LLE short leg cast placed by ortho tech  Splint/Cast - Date Prophylactic Dressing Applied (if applicable) 03/11/23  Restrictions  Weight Bearing Restrictions Yes  LLE Weight Bearing NWB  Pain  Assessment  Pain Assessment Faces  Faces Pain Scale 2  Pain Location L foot  Pain Descriptors / Indicators Grimacing  Pain Intervention(s) Monitored during session;Limited activity within patient's tolerance;Repositioned;Other (comment) (LLE elevated on yellow bone foam)  Cognition  Arousal Alert  Behavior During Therapy Flat affect  Overall Cognitive Status History of cognitive impairments - at baseline  General Comments Pt follows commands inconsistently, fully alert today. Dysarthric speech at baseline, pt often tangential.  Bed Mobility  Overal bed mobility Needs Assistance  Bed Mobility Supine to Sit  Supine to sit Min assist;HOB elevated;Used rails  General bed mobility comments min A to EOB, mod cues to encourage her to initiate  Transfers  Overall transfer level Needs assistance  Equipment used Rolling walker (2 wheels)  Transfers Sit to/from Stand;Bed to chair/wheelchair/BSC  Sit to Stand Mod assist;+2 physical assistance  Bed to/from chair/wheelchair/BSC transfer type: Step pivot  Step pivot transfers Min assist;+2 safety/equipment  General transfer comment mod A (+2 assist) to power STS while maintaining precautions, PTA keeping LLE advanced to prevent WB. x2 transfers to stand from EOB and x1 rep from chair. min A +2 to pivot with RW to her R side and pt with difficulty following NWB precautions while pivoting, not allowing PTA to keep LLE elevated. CGA for lateral scooting a couple reps toward her R side while maintaining LLE NWB  Ambulation/Gait  General Gait Details defer, pt unable to maintain LLE NWB precs during dynamic tasks 2/2 intellectual disability.  Balance  Overall balance assessment Needs assistance  Sitting-balance support No upper extremity supported;Feet supported  Sitting balance-Leahy Scale Fair  Sitting balance - Comments static sitting; poor dynamic seated balance at times  Standing balance support Single extremity supported;During functional  activity   Standing balance-Leahy Scale Poor  Standing balance comment fair when noncompliant with precs; poor with LLE NWB  General Comments  General comments (skin integrity, edema, etc.) pt requesting bath and states she thinks she needs new diaper brief, no new briefs seen in room so RN notified in case they have a supply and also so they can assist her with bathing. BP 139/75 (91) sitting in chair, HR 93 bpm sitting in recliner; LLE elevated at rest  Exercises  Exercises Other exercises  Other Exercises  Other Exercises seated LLE AROM: LAQ, hip flexion x10 reps ea  Other Exercises STS x 2 for RLE and BUE strengthening (with assist to keep LLE elevated/NWB)  PT - End of Session  Equipment Utilized During Treatment Gait belt  Activity Tolerance Patient tolerated treatment well;Other (comment) (intellectual disability limiting some command follow)  Patient left in chair;with call bell/phone within reach;with chair alarm set  Nurse Communication Mobility status;Other (comment) (pt has dietary requests)   PT - Assessment/Plan  PT Visit Diagnosis Other abnormalities of gait and mobility (R26.89);History of falling (Z91.81);Pain  Pain - Right/Left Left  Pain - part of body Ankle and joints of foot  PT Frequency (ACUTE ONLY) Min 1X/week  Follow Up Recommendations Skilled nursing-short term rehab (<3 hours/day)  Can patient physically be transported by private vehicle No  Patient can return home with the following Two people to help with walking and/or transfers;Two people to help with bathing/dressing/bathroom;Assistance with cooking/housework;Assistance with feeding;Direct supervision/assist for medications management;Direct supervision/assist for financial management;Assist for transportation;Help with stairs or ramp for entrance;Supervision due to cognitive status  PT equipment Wheelchair (measurements PT);Wheelchair cushion (measurements PT);Hospital bed;Hoyer lift;Other (comment) (drop arm WC with  elevating leg rests; likely to need smaller/youth size (height 68ft 6"))  AM-PAC PT "6 Clicks" Mobility Outcome Measure (Version 2)  Help needed turning from your back to your side while in a flat bed without using bedrails? 3  Help needed moving from lying on your back to sitting on the side of a flat bed without using bedrails? 2  Help needed moving to and from a bed to a chair (including a wheelchair)? 2 (lateral scoot tf)  Help needed standing up from a chair using your arms (e.g., wheelchair or bedside chair)? 2  Help needed to walk in hospital room? 1  Help needed climbing 3-5 steps with a railing?  1  6 Click Score 11  Consider Recommendation of Discharge To: CIR/SNF/LTACH  Progressive Mobility  What is the highest level of mobility based on the progressive mobility assessment? Level 3 (Stands with assist) - Balance while standing  and cannot march in place (+2 safety)  Mobility Referral No  Activity Transferred from bed to chair (+2 assist)  PT Goal Progression  Progress towards PT goals Progressing toward goals  Acute Rehab PT Goals  PT Goal Formulation With patient  Time For Goal Achievement 03/19/23  PT Time Calculation  PT Start Time (ACUTE ONLY) 1435  PT Stop Time (ACUTE ONLY) 1501  PT Time Calculation (min) (ACUTE ONLY) 26 min  PT General Charges  $$ ACUTE PT VISIT 1 Visit  PT Treatments  $Therapeutic Activity 8-22 mins

## 2023-03-12 DIAGNOSIS — M810 Age-related osteoporosis without current pathological fracture: Secondary | ICD-10-CM | POA: Diagnosis not present

## 2023-03-12 DIAGNOSIS — S82852A Displaced trimalleolar fracture of left lower leg, initial encounter for closed fracture: Secondary | ICD-10-CM | POA: Diagnosis not present

## 2023-03-12 LAB — CBC
HCT: 25.8 % — ABNORMAL LOW (ref 36.0–46.0)
Hemoglobin: 8.3 g/dL — ABNORMAL LOW (ref 12.0–15.0)
MCH: 33.1 pg (ref 26.0–34.0)
MCHC: 32.2 g/dL (ref 30.0–36.0)
MCV: 102.8 fL — ABNORMAL HIGH (ref 80.0–100.0)
Platelets: 102 10*3/uL — ABNORMAL LOW (ref 150–400)
RBC: 2.51 MIL/uL — ABNORMAL LOW (ref 3.87–5.11)
RDW: 14.1 % (ref 11.5–15.5)
WBC: 7.3 10*3/uL (ref 4.0–10.5)
nRBC: 0.8 % — ABNORMAL HIGH (ref 0.0–0.2)

## 2023-03-12 NOTE — Plan of Care (Signed)
[facility] Tonsillectomy/Adenoidectomy, Postoperative PEDS care plan entered in error.

## 2023-03-12 NOTE — Progress Notes (Signed)
Physical Therapy Treatment Patient Details Name: Diamond Bowman MRN: [id number] DOB: 1966/01/25 Today's Date: 03/12/2023   History of Present Illness Pt is 57 year old presented to [facility] hospital on  03/03/23 for fall with left leg pain, found to have L ankle trimalleolar fx. Pt underwent ORIF on 10/10. PMH - MDD, Bipolar, Schizophrenia, Intellectual Disability, Tardive Dyskinesia, CKD 3, Hypothyroidism.    PT Comments  Pt received in supine, agreeable to therapy session with emphasis on transfer training with heavy encouragement. Pt needing up to CGA for safety with bed mobility and up to minA for lateral scoot transfer from bed to drop arm chair which was brought to her room for session. Pt remains noncompliant with LLE NWB status during bed mobility intermittently and would need +2 to manually ensure L NWB status for standing, so instead emphasis on instruction on lateral scoot transfers, pt with good compliance scooting to chair and maintains NWB status. Plan to bring [facility] with removable arm/leg rests next session, would be helpful to have pt caregiver present for education depending on post-acute plan. Pt continues to benefit from PT services to progress toward functional mobility goals.      If plan is discharge home, recommend the following: Two people to help with walking and/or transfers;Two people to help with bathing/dressing/bathroom;Assistance with cooking/housework;Assistance with feeding;Direct supervision/assist for medications management;Direct supervision/assist for financial management;Assist for transportation;Help with stairs or ramp for entrance;Supervision due to cognitive status   Can travel by private vehicle     No (would need wheelchair Zenaida Niece or PTAR)  Geophysical data processor (measurements PT);Wheelchair cushion (measurements PT);Hospital bed;Hoyer lift;Other (comment) (drop arm WC and BSC with elevating leg rests; likely to need smaller/youth size (height 63ft 6"))     Recommendations for Other Services       Precautions / Restrictions Precautions Precautions: Fall Required Braces or Orthoses: Splint/Cast Splint/Cast: LLE short leg cast placed by ortho tech Splint/Cast - Date Prophylactic Dressing Applied (if applicable): 03/11/23 Restrictions Weight Bearing Restrictions: Yes LLE Weight Bearing: Non weight bearing     Mobility  Bed Mobility Overal bed mobility: Needs Assistance Bed Mobility: Supine to Sit Rolling: Independent   Supine to sit: HOB elevated, Used rails, Supervision     General bed mobility comments: cues not to push through LLE when bridging hips to EOB, pt ignoring cues    Transfers Overall transfer level: Needs assistance Equipment used: None Transfers: Bed to chair/wheelchair/BSC            Lateral/Scoot Transfers: Min assist, From elevated surface General transfer comment: Lateral scoot to drop arm chair on her R side, bed pad assist with scooting. Mechanical lift pad placed under her for RN ease of return transfer, pillow and bed pad over lift pad to prevent pressure on her skin while in chair; lap belt alarm in place and activated at end of session.    Ambulation/Gait                   Stairs             Wheelchair Mobility     Tilt Bed    Modified Rankin (Stroke Patients Only)       Balance Overall balance assessment: Needs assistance Sitting-balance support: No upper extremity supported, Feet supported Sitting balance-Leahy Scale: Fair Sitting balance - Comments: EOB       Standing balance comment: defer today, worked on lateral scoot transfer safety  Cognition Arousal: Alert Behavior During Therapy: Flat affect, Impulsive Overall Cognitive Status: History of cognitive impairments - at baseline                                 General Comments: Pt follows commands with encouragement and increased time, fully alert today.  Pt often making unrelated requests.        Exercises Other Exercises Other Exercises: supine LLE AROM: SLR x5 reps    General Comments General comments (skin integrity, edema, etc.): LLE elevated on yellow bone foam/blanket while up in chair, chair alarm around waist (posey lap belt, removable) pt shown how to remove velcro and nods yes in understanding.      Pertinent Vitals/Pain Pain Assessment Pain Assessment: Faces Faces Pain Scale: Hurts a little bit Pain Location: L foot Pain Descriptors / Indicators: Grimacing Pain Intervention(s): Monitored during session, Repositioned    Home Living                          Prior Function            PT Goals (current goals can now be found in the care plan section) Acute Rehab PT Goals Patient Stated Goal: pt does not state, supervising PT goal to progress to transfer training with NWB LLE PT Goal Formulation: With patient Time For Goal Achievement: 03/19/23 Progress towards PT goals: Progressing toward goals    Frequency    Min 1X/week      PT Plan      Co-evaluation              AM-PAC PT "6 Clicks" Mobility   Outcome Measure  Help needed turning from your back to your side while in a flat bed without using bedrails?: None Help needed moving from lying on your back to sitting on the side of a flat bed without using bedrails?: A Little Help needed moving to and from a bed to a chair (including a wheelchair)?: A Little Help needed standing up from a chair using your arms (e.g., wheelchair or bedside chair)?: A Lot (+2 assist due to NWB) Help needed to walk in hospital room?: Total Help needed climbing 3-5 steps with a railing? : Total 6 Click Score: 14    End of Session Equipment Utilized During Treatment: Gait belt Activity Tolerance: Other (comment);Patient tolerated treatment well (intellectual disability limiting some command follow) Patient left: in chair;with call bell/phone within reach;with  chair alarm set Nurse Communication: Mobility status;Other (comment) (safety lap belt in place in chair, pt has drop arm chair for return transfer, lift pad in place under her if pt unable to safely scoot back to bed.) PT Visit Diagnosis: Other abnormalities of gait and mobility (R26.89);History of falling (Z91.81);Pain Pain - Right/Left: Left Pain - part of body: Ankle and joints of foot     Time: 1209-1237 PT Time Calculation (min) (ACUTE ONLY): 28 min  Charges:    $Therapeutic Activity: 23-37 mins PT General Charges $$ ACUTE PT VISIT: 1 Visit                     Evvie Behrmann P., PTA Acute Rehabilitation Services Secure Chat Preferred 9a-5:30pm Office: [phone]    Dorathy Kinsman [facility] 03/12/2023, 1:28 PM

## 2023-03-12 NOTE — Plan of Care (Signed)
Problem: Education: Goal: Knowledge of General Education information will improve Description: Including pain rating scale, medication(s)/side effects and non-pharmacologic comfort measures Outcome: Progressing Pt is confused and is A/O to self and place.  She is cognitively impaired.  Pt does not understands why she was admitted into the hospital.  Pt admitted from the ED for LLE pain s/p mech trip and fall where she sustained a left trimalleolar fx.  She had an ORIF of her LLE on 03/04/2023.  Pt is awaiting d/c to rehab facility.    Problem: Clinical Measurements: Goal: Will remain free from infection Outcome: Progressing S/Sx of infection monitored and assessed q-shift.  Pt has remained afebrile thus far.     Problem: Clinical Measurements: Goal: Respiratory complications will improve Outcome: Progressing Respiratory status monitored and assessed q-shift.  Pt is on room air with PO2 at 99-100% and respiration rate of 16-19 breaths per minute.  Pt has not endorsed c/o SOB or DOE.    Problem: Clinical Measurements: Goal: Cardiovascular complication will be avoided Outcome: Progressing Pt's VS WNL.     Problem: Activity: Goal: Risk for activity intolerance will decrease Outcome: Progressing Pt is dependent of all her ADLs.  She cannot get up OOB w/o the assistance of RN staff.   Problem: Nutrition: Goal: Adequate nutrition will be maintained Outcome: Progressing Pt is on a regular diet per MD's orders and can tolerate it w/o s/sx of abdominal pain/ distention or n/v.     Problem: Safety: Goal: Ability to remain free from injury will improve Outcome: Progressing Pt has remained free from falls thus far.  Instructed pt to utilize RN call light for assistance.  Hourly rounds performed.  Bed alarm implemented to keep pt safe from falls.  Settings activated to third most sensitive mode.  Bed in lowest position, locked with two upper side rails engaged.  Belongings and call light within  reach.    Problem: Skin Integrity: Goal: Risk for impaired skin integrity will decrease Outcome: Progressing Skin integrity monitored and assessed q-shift.  Pt is on q2 hourly turns to prevent further skin impairment.  Tubes and drains assessed for device related pressures sores.  Pt is incontinent of both bowel and bladder.  She is checked q2 hours for bowel incontinence.  Perineal care given promptly after each episode.  Pt has an external female catheter (purewick) in place to capture her urine. Management of external female catheter (purewick) performed per [facility] policy procedure and guidelines.

## 2023-03-12 NOTE — Progress Notes (Signed)
Orthopedic Tech Progress Note Patient Details:  Diamond Bowman 03/05/1966 [id number]  Previous short leg cast removed and reapplied so it encompassed more of her forefoot per request from Montez Morita,  PA-C. Pt communicated that it was not causing any discomfort once the cast was applied.   Casting Type of Cast: Short leg cast Cast Location: LLE Cast Material: Fiberglass Cast Intervention: Re-application  Post Interventions Patient Tolerated: Well Instructions Provided: Adjustment of device, Care of device   [name] Carmine Savoy 03/12/2023, 2:42 PM

## 2023-03-12 NOTE — Progress Notes (Signed)
Summary: Kassie Mends. Roulette is a 57 y.o. F with PMH bipolar 2 disorder, schizophrenia, intellectual disability, osteoporosis, CKD stage 3, heart murmur, hypothyroidism, osteoporosis, seizures who presents after a mechanical fall at home 10/08 resulting in left ankle pain limiting mobility.   Subjective:  Patient resting comfortably in bed in no acute distress, eating breakfast. Requested coffee as well as water in a cup with a lid. Denied any pain.   Called patient's mother, Rhunette Croft, to provide an update. Discussed change to short cast by orthopedics yesterday.   Objective:  Vital signs in last 24 hours: afebrile, normotensive, satting well on RA.  Vitals:   03/11/23 1957 03/11/23 2057 03/12/23 0500 03/12/23 0837  BP: (!) 116/33 134/63 (!) 129/99 93/73  Pulse: 75  78 85  Resp: 16  19 16   Temp: (!) 97.5 F (36.4 C)  98 F (36.7 C) 98.4 F (36.9 C)  TempSrc: Oral  Oral Oral  SpO2: 98%  99% 100%  Weight:      Height:          Latest Ref Rng & Units 03/10/2023    8:26 AM 03/09/2023   12:41 PM 03/07/2023    8:42 AM  CBC  WBC 4.0 - 10.5 K/uL 9.7  5.8  6.9   Hemoglobin 12.0 - 15.0 g/dL 8.4  8.6  8.8   Hematocrit 36.0 - 46.0 % 25.2  25.7  26.4   Platelets 150 - 400 K/uL 95  PLATELET CLUMPS NOTED ON SMEAR, UNABLE TO ESTIMATE  57     CBC 10/17 pending   Physical Exam Constitutional: Patient sitting up in bed in no acute distress, making comments, requests and following verbal prompts to participate in the exam.  CV: RRR, no r/m/g, no LE edema.  Pulmonary/Respiratory: Normal respiratory effort on room air.  Abdominal: Soft, non-tender, non-distended. Neuro: Alert, no acute deficits noted on exam.  MSK: Left foot in short cast, patient able to move both lower extremities spontaneously without expressing pain.   Assessment/Plan:  Principal Problem:   Left trimalleolar fracture, closed, initial encounter  L trimalleolar fracture, closed Osteoporosis Pt is status post ORIF of  L ankle performed 10/10 by Dr. Carola Frost. Continues to be difficult to determine if patient is in pain as she sometimes indicates that she has discomfort and other times changes topic to make a comment or request. No issues with eating or drinking.   Will be discharged to SNF/acute rehab, appreciate TOC/SW help with this. PT/OT to continue following. Ortho converted splint to short cast 10/17.   Plan: - Scheduled Tylenol 650 mg q6H. Continue oxycodone 5 mg q4h PRN moderate pain.  -Bowel regimen: senna BID -F/u orthopedic surgery recommendations: patient can now be weight-bearing; OP follow up for repeat imaging in 10 days post surgery.   - Continue home calcium carbonate-cholecalciferol 500-15 mg-mcg daily, MV one tablet daily   Elevated BP BP improved, last BP 93/73. May be intermittently elevated due to pain or discomfort. Continue to monitor.   Intellectual disability Bipolar 2 disorder Schizophrenia Insomnia Chronic. Will continue home regimen here. Plan: -Continue home olanzapine 15 mg daily at bedtime, trazodone 300 mg daily at bedtime, mirtazapine 15 mg daily at bedtime, cyproheptadine 4 mg daily at bedtime, hydroxyzine 50 mg BID   Seizure disorder Chronic. Will monitor.  -Continue home depakote 1500 mg daily at bedtime.   Hypothyroidism Chronic. TSH last checked in 2023 was normal. TSH 10/12 2.791 WNL.  Plan: -Continue home levothyroxine 75 mcg daily.  Hx atrial fibrillation on aspirin Chronic. EKG on admission showing NSR. Will monitor.  -Continue home metoprolol 50 mg daily.   Hx iron deficiency anemia Chronic. CBC shows stable anemia. Iron level checked at 17, Tsat 7 %. Ferritin 269 consistent with iron deficiency anemia. Will add B12 and folate given her anemia is microcytic despite iron deficiency. Hgb today 8.4. - Monitor labs   - Continue home ferrous sulfate.  Thrombocytopenia Chronic. Will trend on labs, 95 K/uL 10/16. No indication for transfusion.  Prior to  Admission Living Arrangement: Home Anticipated Discharge Location: SNF Barriers to Discharge: Medical stability Dispo: Anticipated discharge in approximately 1-2 day(s).   Prudence Heiny Colbert Coyer, MD Internal Medicine Teaching Service, PGY-1 Please contact the on call pager after 5 pm and on weekends at [phone].

## 2023-03-13 DIAGNOSIS — S82852A Displaced trimalleolar fracture of left lower leg, initial encounter for closed fracture: Secondary | ICD-10-CM | POA: Diagnosis not present

## 2023-03-13 DIAGNOSIS — M810 Age-related osteoporosis without current pathological fracture: Secondary | ICD-10-CM | POA: Diagnosis not present

## 2023-03-13 NOTE — Progress Notes (Signed)
Summary: Diamond Bowman. Sneddon is a 57 y.o. F with PMH bipolar 2 disorder, schizophrenia, intellectual disability, osteoporosis, CKD stage 3, heart murmur, hypothyroidism, osteoporosis, seizures who presents after a mechanical fall at home 10/08 resulting in left ankle pain limiting mobility.   Subjective:  No overnight events reported. Patient about to start eating breakfast. Denied any pain or discomfort. Able to answer simple questions and follow simple verbal prompts. Made requests for help with breakfast items, notified nursing.   Objective:  Vitals:   03/12/23 1540 03/12/23 2013 03/13/23 0549 03/13/23 0710  BP: (!) 141/43 (!) 102/49 (!) 120/56 133/67  Pulse: 79 76 82 60  Resp: 16 18 18 17   Temp: 98.5 F (36.9 C) 98.5 F (36.9 C) 98.7 F (37.1 C) 98.4 F (36.9 C)  TempSrc: Oral Oral Oral Oral  SpO2: 100% 98% 100% 94%  Weight:      Height:          Latest Ref Rng & Units 03/12/2023   10:15 AM 03/10/2023    8:26 AM 03/09/2023   12:41 PM  CBC  WBC 4.0 - 10.5 K/uL 7.3  9.7  5.8   Hemoglobin 12.0 - 15.0 g/dL 8.3  8.4  8.6   Hematocrit 36.0 - 46.0 % 25.8  25.2  25.7   Platelets 150 - 400 K/uL 102  95  PLATELET CLUMPS NOTED ON SMEAR, UNABLE TO ESTIMATE     Lab holiday 10/19  Physical Exam Constitutional: Resting comfortably in no acute distress, cooperative.  CV: RRR, no r/m/g, no LE edema Pulmonary/Respiratory: Normal respiratory effort on room air Abdominal: Soft, non-tender, non-distended Neuro: Alert, no acute deficits observed MSK: Patient moving both lower extremities spontaneously, left foot in short cast, no edema/bleeding/discharge noted  Assessment/Plan:  Principal Problem:   Left trimalleolar fracture, closed, initial encounter  L trimalleolar fracture, closed Osteoporosis Pt is status post ORIF of L ankle performed 10/10 by Dr. Carola Frost. Ortho converted splint to short cast 10/17.   Patient is able to express if LE is sore and pain medication is provided at  that time. Pain assessment is not always consistent on exam, as patient may change the subject or not answer the question directly.   Patient remains medically stable for discharge pending SNF placement. Appreciate TOC/SW help with this. PT/OT to continue following.   Plan: - Scheduled Tylenol 650 mg q6H. Continue oxycodone 5 mg q4h PRN moderate pain.  -Bowel regimen: senna BID -F/u orthopedic surgery recommendations: patient is weight-bearing; OP follow up for repeat imaging in 10 days post surgery (~10/20 if inpatient, 10/21 if outpatient)  - Continue home calcium carbonate-cholecalciferol 500-15 mg-mcg daily, MV one tablet daily   Elevated BP BP improved, last BP 133/67. May be intermittently elevated due to pain or discomfort. Continue to monitor.   Intellectual disability Bipolar 2 disorder Schizophrenia Insomnia Chronic. Will continue home regimen here. Plan: -Continue home olanzapine 15 mg daily at bedtime, trazodone 300 mg daily at bedtime, mirtazapine 15 mg daily at bedtime, cyproheptadine 4 mg daily at bedtime, hydroxyzine 50 mg BID   Seizure disorder Chronic. Will monitor.  -Continue home depakote 1500 mg daily at bedtime.   Hypothyroidism Chronic. TSH last checked in 2023 was normal. TSH 10/12 2.791 WNL.  Plan: -Continue home levothyroxine 75 mcg daily.   Hx atrial fibrillation on aspirin Chronic. EKG on admission showing NSR. Will monitor.  -Continue home metoprolol 50 mg daily.   Hx iron deficiency anemia Chronic. CBC shows stable anemia. Iron level checked at  17, Tsat 7 %. Ferritin 269 consistent with iron deficiency anemia. Will add B12 and folate given her anemia is microcytic despite iron deficiency. Hgb today 8.4. - Monitor labs   - Continue home ferrous sulfate.  Thrombocytopenia Chronic. Will trend on labs, 102 K/uL 10/18. No indication for transfusion.  Prior to Admission Living Arrangement: Home Anticipated Discharge Location: SNF Barriers to  Discharge: SNF placement Dispo: Anticipated discharge in approximately 1-2 day(s).   Nesta Kimple Colbert Coyer, MD Internal Medicine Teaching Service, PGY-1 Please contact the on call pager after 5 pm and on weekends at [phone].

## 2023-03-14 DIAGNOSIS — S82852A Displaced trimalleolar fracture of left lower leg, initial encounter for closed fracture: Secondary | ICD-10-CM | POA: Diagnosis not present

## 2023-03-14 LAB — CBC
HCT: 26.7 % — ABNORMAL LOW (ref 36.0–46.0)
Hemoglobin: 8.4 g/dL — ABNORMAL LOW (ref 12.0–15.0)
MCH: 32.6 pg (ref 26.0–34.0)
MCHC: 31.5 g/dL (ref 30.0–36.0)
MCV: 103.5 fL — ABNORMAL HIGH (ref 80.0–100.0)
Platelets: 109 10*3/uL — ABNORMAL LOW (ref 150–400)
RBC: 2.58 MIL/uL — ABNORMAL LOW (ref 3.87–5.11)
RDW: 14.3 % (ref 11.5–15.5)
WBC: 5 10*3/uL (ref 4.0–10.5)
nRBC: 0.6 % — ABNORMAL HIGH (ref 0.0–0.2)

## 2023-03-14 NOTE — Progress Notes (Signed)
Summary: Diamond Bowman is a 57 y.o. F with PMH bipolar 2 disorder, schizophrenia, intellectual disability, osteoporosis, CKD stage 3, heart murmur, hypothyroidism, osteoporosis, seizures who presents after a mechanical fall at home 10/08 resulting in left ankle pain limiting mobility.   Subjective:  No overnight events reported. Patient was sleeping comfortably this morning, awakened easily with verbal prompting. She made requests for coffee and a banana and told her breakfast would be brought in soon. Patient denied any pain or soreness in her left foot. She was alert and cooperative.   Objective:  Vitals:   03/13/23 1700 03/13/23 2043 03/14/23 0603 03/14/23 0700  BP: 136/63 (!) 141/115 (!) 83/36 (!) 132/56  Pulse: 67 76 75 67  Resp: 18 18 18 17   Temp: 98.5 F (36.9 C) 98.4 F (36.9 C) 98.5 F (36.9 C) 97.8 F (36.6 C)  TempSrc: Oral Oral Oral Oral  SpO2: 99% 94% 99% 100%  Weight:      Height:          Latest Ref Rng & Units 03/12/2023   10:15 AM 03/10/2023    8:26 AM 03/09/2023   12:41 PM  CBC  WBC 4.0 - 10.5 K/uL 7.3  9.7  5.8   Hemoglobin 12.0 - 15.0 g/dL 8.3  8.4  8.6   Hematocrit 36.0 - 46.0 % 25.8  25.2  25.7   Platelets 150 - 400 K/uL 102  95  PLATELET CLUMPS NOTED ON SMEAR, UNABLE TO ESTIMATE     Lab holiday   Physical Exam Constitutional: Resting comfortably in bed in no acute distress.  CV: RRR, no r/m/g, no LE edema.  Pulmonary/Respiratory: Normal respiratory effort on room air, did not appreciate crackles or wheezing on auscultation.  Abdominal: Soft, non-tender, non-distended.  Neuro: Alert and oriented, no acute deficits noted.  MSK: Left lower extremity in short cast, able to move LE spontaneously, no pain or discomfort noted, no overt discharge or bleeding.   Assessment/Plan:  Principal Problem:   Left trimalleolar fracture, closed, initial encounter  L trimalleolar fracture, closed Osteoporosis Pt is status post ORIF of L ankle performed  10/10 by Dr. Carola Frost. Ortho converted splint to short cast 10/17.   Patient is able to express if LE is sore and pain medication is provided at that time. Pain assessment is not always consistent on exam, as patient may change the subject or not answer the question directly.   Patient remains medically stable for discharge pending SNF placement. Appreciate TOC/SW help with this. PT/OT to continue following. Messaged orthopedic surgery regarding repeat imaging as it is 10 days post surgery.   Plan: - Scheduled Tylenol 650 mg q6H. Continue oxycodone 5 mg q4h PRN moderate pain.  - Bowel regimen: senna BID - F/u orthopedic surgery for repeat imaging in 10 days post surgery   - Continue home calcium carbonate-cholecalciferol 500-15 mg-mcg daily, MV one tablet daily   Elevated BP BP improved, last BP 133/67. May be intermittently elevated due to pain or discomfort. Continue to monitor.   Intellectual disability Bipolar 2 disorder Schizophrenia Insomnia Chronic. Will continue home regimen here. Plan: -Continue home olanzapine 15 mg daily at bedtime, trazodone 300 mg daily at bedtime, mirtazapine 15 mg daily at bedtime, cyproheptadine 4 mg daily at bedtime, hydroxyzine 50 mg BID   Seizure disorder Chronic. Will monitor.  -Continue home depakote 1500 mg daily at bedtime.   Hypothyroidism Chronic. TSH last checked in 2023 was normal. TSH 10/12 2.791 WNL.  Plan: -Continue home levothyroxine 75  mcg daily.   Hx atrial fibrillation on aspirin Chronic. EKG on admission showing NSR. RRR on CV exam. Will monitor.  -Continue home metoprolol 50 mg daily.   Hx iron deficiency anemia Chronic. CBC shows stable anemia. Iron level checked at 17, Tsat 7 %. Ferritin 269 consistent with iron deficiency anemia. Will add B12 and folate given her anemia is microcytic despite iron deficiency. Hgb today 8.3 03/12/23, lab holiday today. - Monitor labs as needed. - Continue home ferrous  sulfate.  Thrombocytopenia Chronic. Will trend on labs as needed, 102 K/uL 10/18. No indication for transfusion.  Prior to Admission Living Arrangement: Home Anticipated Discharge Location: SNF Barriers to Discharge: SNF placement Dispo: Anticipated discharge in approximately 1-2 day(s).   Lively Haberman Colbert Coyer, MD Internal Medicine Teaching Service, PGY-1 Please contact the on call pager after 5 pm and on weekends at [phone].

## 2023-03-14 NOTE — Plan of Care (Signed)
Diamond Bowman has not complained of pain today. Her PO intake has improved. She did allow staff to provide personal care and bathing today. She has declined her nutritional supplements at this time.  Problem: Education: Goal: Knowledge of General Education information will improve Description: Including pain rating scale, medication(s)/side effects and non-pharmacologic comfort measures Outcome: Progressing   Problem: Health Behavior/Discharge Planning: Goal: Ability to manage health-related needs will improve Outcome: Progressing   Problem: Clinical Measurements: Goal: Ability to maintain clinical measurements within normal limits will improve Outcome: Progressing Goal: Will remain free from infection Outcome: Progressing Goal: Diagnostic test results will improve Outcome: Progressing Goal: Respiratory complications will improve Outcome: Progressing Goal: Cardiovascular complication will be avoided Outcome: Progressing   Problem: Activity: Goal: Risk for activity intolerance will decrease Outcome: Progressing   Problem: Nutrition: Goal: Adequate nutrition will be maintained Outcome: Progressing   Problem: Coping: Goal: Level of anxiety will decrease Outcome: Progressing   Problem: Elimination: Goal: Will not experience complications related to bowel motility Outcome: Progressing Goal: Will not experience complications related to urinary retention Outcome: Progressing   Problem: Pain Managment: Goal: General experience of comfort will improve Outcome: Progressing   Problem: Safety: Goal: Ability to remain free from injury will improve Outcome: Progressing   Problem: Skin Integrity: Goal: Risk for impaired skin integrity will decrease Outcome: Progressing

## 2023-03-15 ENCOUNTER — Inpatient Hospital Stay (HOSPITAL_COMMUNITY): Payer: MEDICAID

## 2023-03-15 DIAGNOSIS — S82852A Displaced trimalleolar fracture of left lower leg, initial encounter for closed fracture: Secondary | ICD-10-CM | POA: Diagnosis not present

## 2023-03-15 DIAGNOSIS — M810 Age-related osteoporosis without current pathological fracture: Secondary | ICD-10-CM | POA: Diagnosis not present

## 2023-03-15 MED ORDER — GERHARDT'S BUTT CREAM
TOPICAL_CREAM | Freq: Three times a day (TID) | CUTANEOUS | Status: DC | PRN
  Filled 2023-03-15 (×2): qty 1

## 2023-03-15 MED ORDER — POLYVINYL ALCOHOL 1.4 % OP SOLN: 1.0000 [drp] | OPHTHALMIC | Status: DC | PRN

## 2023-03-15 NOTE — Progress Notes (Addendum)
Physical Therapy Treatment Patient Details Name: Diamond Bowman MRN: [id number] DOB: 14-Oct-1965 Today's Date: 03/15/2023   History of Present Illness Pt is 57 year old presented to [facility] hospital on  03/03/23 for fall with left leg pain, found to have L ankle trimalleolar fx. Pt underwent ORIF on 10/10. LLE Short leg cast reapplied 10/18. PMH - MDD, Bipolar, Schizophrenia, Intellectual Disability, Tardive Dyskinesia, CKD 3, Hypothyroidism.    PT Comments  Pt received in supine, agreeable to therapy session with encouragement, clearance for session per RN. Pt needing up to minA to perform lateral scooting and mod cues for LLE NWB during bed mobility as pt with cognitive deficit and has difficulty recalling not to using LLE when bridging in bed to scoot. Pt with skin breakdown on bottom, needs frequent skin checks and to be rolled/repositioned Q2H. Good effort in seated BLE HEP. Pt continues to benefit from PT services to progress toward functional mobility goals.     If plan is discharge home, recommend the following: Two people to help with walking and/or transfers;Two people to help with bathing/dressing/bathroom;Assistance with cooking/housework;Assistance with feeding;Direct supervision/assist for medications management;Direct supervision/assist for financial management;Assist for transportation;Help with stairs or ramp for entrance;Supervision due to cognitive status   Can travel by private vehicle     No (would need wheelchair Zenaida Niece or PTAR)  Geophysical data processor (measurements PT);Wheelchair cushion (measurements PT);Hospital bed;Hoyer lift;Other (comment) (drop arm WC and BSC with elevating leg rests; likely to need smaller/youth size (height 60ft 6"))    Recommendations for Other Services       Precautions / Restrictions Precautions Precautions: Fall Precaution Comments: intellectual disability Required Braces or Orthoses: Splint/Cast Splint/Cast: LLE short leg cast  placed by ortho tech Splint/Cast - Date Prophylactic Dressing Applied (if applicable): 03/12/23 Restrictions Weight Bearing Restrictions: Yes LLE Weight Bearing: Non weight bearing     Mobility  Bed Mobility Overal bed mobility: Needs Assistance Bed Mobility: Supine to Sit Rolling: Independent   Supine to sit: HOB elevated, Used rails, Contact guard, Min assist     General bed mobility comments: cues not to push through LLE when bridging hips to EOB, pt ignoring cues, CGA to minA to keep LLE from pushing into bed and for trunk stability while scooting    Transfers Overall transfer level: Needs assistance Equipment used: None Transfers: Bed to chair/wheelchair/BSC            Lateral/Scoot Transfers: Min assist, From elevated surface General transfer comment: Lateral scoot to drop arm chair on her L side, bed pad assist with scooting. Lap belt alarm in place and activated at end of session, pt shown how to remove velcro if needing to get up, pt cued to use call bell however for her safety due to LLE NWB precs.    Ambulation/Gait               General Gait Details: defer, pt unable to maintain LLE NWB precs during dynamic tasks 2/2 intellectual disability.   Stairs             Wheelchair Mobility     Tilt Bed    Modified Rankin (Stroke Patients Only)       Balance Overall balance assessment: Needs assistance Sitting-balance support: No upper extremity supported, Feet supported Sitting balance-Leahy Scale: Fair Sitting balance - Comments: EOB       Standing balance comment: defer today, worked on lateral scoot transfer safety  Cognition Arousal: Alert Behavior During Therapy: Flat affect, Impulsive Overall Cognitive Status: History of cognitive impairments - at baseline                                 General Comments: Pt follows commands with encouragement and increased time, fully alert  today. Pt often making unrelated requests. Pt with difficulty complying with LLE NWB precs unless PTA constantly holding limb during bed mobility/scooting        Exercises Other Exercises Other Exercises: seated BLE A/AAROM: hip flexion, LAQ x10 reps ea (verbal + tactile cues due to cognitive deficit) Other Exercises: single leg bridging hips in bed x3 reps (PTA holding LLE elevated to prevent WB) Attempted to have pt perform chair push-ups with BUE and single leg but pt unable to understand despite visual demo/multimodal cues.   General Comments General comments (skin integrity, edema, etc.): pt with reddened area on bottom and area of open skin, RN notified she needs foam dressing (called front desk to notify after session and NT answered, notified to pass along to RN)      Pertinent Vitals/Pain Pain Assessment Pain Assessment: PAINAD Breathing: normal Negative Vocalization: none Facial Expression: facial grimacing Body Language: tense, distressed pacing, fidgeting Consolability: no need to console PAINAD Score: 3 Pain Location: L foot/LLE Pain Descriptors / Indicators: Grimacing Pain Intervention(s): Limited activity within patient's tolerance, Monitored during session, Repositioned    Home Living                          Prior Function            PT Goals (current goals can now be found in the care plan section) Acute Rehab PT Goals Patient Stated Goal: pt does not state, supervising PT goal to progress to transfer training with NWB LLE PT Goal Formulation: With patient Time For Goal Achievement: 03/19/23 Progress towards PT goals: Progressing toward goals    Frequency    Min 1X/week      PT Plan      Co-evaluation              AM-PAC PT "6 Clicks" Mobility   Outcome Measure  Help needed turning from your back to your side while in a flat bed without using bedrails?: None Help needed moving from lying on your back to sitting on the side of a  flat bed without using bedrails?: A Little Help needed moving to and from a bed to a chair (including a wheelchair)?: A Little Help needed standing up from a chair using your arms (e.g., wheelchair or bedside chair)?: A Lot (+2 assist due to NWB) Help needed to walk in hospital room?: Total Help needed climbing 3-5 steps with a railing? : Total 6 Click Score: 14    End of Session Equipment Utilized During Treatment: Gait belt Activity Tolerance: Other (comment);Patient tolerated treatment well (intellectual disability limiting some command follow) Patient left: in chair;with call bell/phone within reach;with chair alarm set Nurse Communication: Mobility status;Other (comment) (safety lap belt in place in chair, pt has drop arm chair for return transfer, lift pad in room if she has difficulty scooting) PT Visit Diagnosis: Other abnormalities of gait and mobility (R26.89);History of falling (Z91.81);Pain Pain - Right/Left: Left Pain - part of body: Ankle and joints of foot     Time: 1610-9604 PT Time Calculation (min) (ACUTE ONLY): 19 min  Charges:    $  Therapeutic Activity: 8-22 mins PT General Charges $$ ACUTE PT VISIT: 1 Visit                     Tamra Koos P., PTA Acute Rehabilitation Services Secure Chat Preferred 9a-5:30pm Office: [phone]    Dorathy Kinsman [facility] 03/15/2023, 4:55 PM

## 2023-03-15 NOTE — TOC Progression Note (Addendum)
Transition of Care [facility]) - Progression Note    Patient Details  Name: Diamond Bowman MRN: [id number] Date of Birth: [birth year]  [facility] CM/SW Contact  Shontae Rosiles A [place], [place] Phone Number: 03/15/2023, 11:36 AM  Clinical Narrative:     Update 10/21 1458 CSW reached out to [place] in admissions regarding bed offer to [facility]. There was no answer. CSW left voicemail with contact information to reach back out to CSW.   CSW  contacted [place] in admissions regarding possible bed offer for [facility]. She stated she would reach back out to CSW once decision on bed offer has been made. Provider notified.   TOC will continue to follow.    Expected Discharge Plan: Skilled Nursing Facility Barriers to Discharge: English as a second language teacher, SNF Pending bed offer  Expected Discharge Plan and Services In-house Referral: Clinical Social Work     Living arrangements for the past 2 months: Single Family Home                                       Social Determinants of Health (SDOH) Interventions SDOH Screenings   Food Insecurity: Patient Unable To Answer (03/03/2023)  Housing: Patient Unable To Answer (03/03/2023)  Transportation Needs: Patient Unable To Answer (03/03/2023)  Utilities: Patient Unable To Answer (03/03/2023)  Tobacco Use: Medium Risk (03/03/2023)    Readmission Risk Interventions     No data to display

## 2023-03-15 NOTE — Progress Notes (Signed)
Orthopaedic Trauma Service   Post op xrays look good Follow up with ortho in 3 weeks for cast removal  Skin checks around cast q shift NWB L leg   Mearl Latin, PA-C [phone] (C) 03/15/2023, 11:12 AM  [facility] [address] [place] [address] [phone] Collier Bullock (F)      Patient ID: Diamond Bowman, female   DOB: 1966-01-27, 57 y.o.   MRN: [id number]

## 2023-03-15 NOTE — Progress Notes (Addendum)
HD#12 Subjective:   Summary: Diamond Bowman is a 57 year old female with extensive psychiatric history of schizophrenia , bipolar 2 disorder ,CKD stage 3, osteoporosis ans seizures who presented with after a fall and found to have Left trimalleolar fracture s/p open reduction internal fixation of left trimalleolar ankle fracture with fixation of the posterior lip manual application of stress ankle syndesmosis under fluoroscopy.  Patient continually asked for food this morning. Nurse reported its not new and that pt is been getting all her meals on time. No active issues. Patient is pending SNF. Spoke to social work [place] [facility] who reassured [place] that they will inform us as soon as a bed becomes available.   Overnight Events: No overnight events . Patient however expressed concerns for itchy left eye. Objective:  Vital signs in last 24 hours: Vitals:   03/14/23 1554 03/14/23 2040 03/15/23 0601 03/15/23 0750  BP: 103/60 (!) 92/56 100/77 128/85  Pulse:  70 78 73  Resp: 18 18 18 16   Temp: 98.1 F (36.7 C) 98.7 F (37.1 C) 98.5 F (36.9 C) 97.7 F (36.5 C)  TempSrc: Oral Oral Oral Oral  SpO2:  100% 100% 100%  Weight:      Height:       Supplemental O2: Room Air SpO2: 100 %   Physical Exam:  Constitutional: Laying in bed, ,unclear speech, not in any acute distress Cardiovascular: regular rate and rhythm, no m/r/g Pulmonary/Chest: normal work of breathing on room air, lungs clear to auscultation bilaterally Abdominal: soft, non-tender, non-distended MSK: normal bulk and tone Neuro: Patient with difficult to understand speech but this appears to be her baseline.  Filed Weights   03/03/23 0811 03/04/23 0944  Weight: 42 kg 42 kg    No intake or output data in the 24 hours ending 03/15/23 1326 Net IO Since Admission: 784 mL [03/15/23 1326]  Pertinent Labs:    Latest Ref Rng & Units 03/14/2023   12:45 PM 03/12/2023   10:15 AM 03/10/2023    8:26 AM  CBC  WBC 4.0 - 10.5 K/uL 5.0   7.3  9.7   Hemoglobin 12.0 - 15.0 g/dL 8.4  8.3  8.4   Hematocrit 36.0 - 46.0 % 26.7  25.8  25.2   Platelets 150 - 400 K/uL 109  102  95        Latest Ref Rng & Units 03/09/2023   12:41 PM 03/06/2023    6:07 AM 03/05/2023    9:55 AM  CMP  Glucose 70 - 99 mg/dL 782  956  213   BUN 6 - 20 mg/dL 22  9  12    Creatinine 0.44 - 1.00 mg/dL 0.86  5.78  4.69   Sodium 135 - 145 mmol/L 138  142  144   Potassium 3.5 - 5.1 mmol/L 4.2  4.9  4.0   Chloride 98 - 111 mmol/L 99  102  100   CO2 22 - 32 mmol/L 30  31  31    Calcium 8.9 - 10.3 mg/dL 8.4  8.9  8.5     Imaging: DG Ankle Complete Left  Result Date: 03/15/2023 CLINICAL DATA:  Follow-up ankle fracture. EXAM: LEFT ANKLE COMPLETE - 3+ VIEW COMPARISON:  03/04/2023 FINDINGS: Lateral plate and screw fixation of distal fibular fracture. Unchanged fracture alignment. No periprosthetic lucency. Screw traverses the medial malleolus and distal tibia. Medial malleolar fracture line is only faintly visible. Fractures are in unchanged alignment. Overlying splint material in place that limits osseous and  soft tissue fine detail. IMPRESSION: Unchanged alignment of distal fibular and medial malleolar fractures post ORIF. No hardware complication. Electronically Signed   By: Narda Rutherford M.D.   On: 03/15/2023 10:24    Assessment/Plan:   Principal Problem:   Left trimalleolar fracture, closed, initial encounter   Patient Summary: Diamond Bowman is a 57 y.o. with a pertinent PMH of  schizophrenia , bipolar 2 disorder ,CKD stage 3, osteoporosis ans seizures, who presented with mechanical fall  and admitted for Left trimalleolar fracture.    #Left trimalleolar fracture #Osteoporosis S/P open reduction internal fixation of left trimalleolar ankle fracture with fixation of the posterior lip manual application of stress ankle syndesmosis under fluoroscopy.Ortho is following. Post op xrays looks good. Cast is still in place.  - Ortho recs appreciated -  Follow up with ortho in 3 weeks for cast removal  - Request skin checks around cast q Shift  #Itchy Left eye  Complained of itchy left eye,unsure if its new or not.Poor historian due to intellectual disability - Start liquifilm tears   #Elevated BP No hx of HTN. Appears to be 2/2 to problem 1. Plan to monitor for now. Does have prns on board.    #Intellectual disability #Bipolar 2 disorder #Schizophrenia #Insomnia -Continue home regimen (olanzapine 15 mg daily at bedtime, trazodone 300 mg daily at bedtime, mirtazapine 15 mg daily at bedtime, cyproheptadine 4 mg daily at bedtime, hydroxyzine 50 mg BID)   #Seizure disorder -Continue home depakote 1500 mg daily at bedtime   #Hypothyroidism -Continue home levothyroxine 75 mcg daily   #Hx atrial fibrillation on aspirin Chronic. -Continue home metoprolol 50 mg daily   #Hx of Anemia  #Thrombocytopenia  Chronic Anemia. CBC shows stable anemia. Hgb is 8.4 today. Ph has a hx of chronic thrombocytopenia. Platelet 109 yesterday .No new labs today .Continue to trend CBC.   Diet: Normal IVF: None,None VTE: None Code: Full PT/OT recs: None, none.  Dispo: Anticipated discharge to Skilled nursing facility .Pending acceptance  Kathleen Lime, MD Internal Medicine Resident PGY-1 Please contact the on call pager after 5 pm and on weekends at [phone]

## 2023-03-16 DIAGNOSIS — S82852A Displaced trimalleolar fracture of left lower leg, initial encounter for closed fracture: Secondary | ICD-10-CM | POA: Diagnosis not present

## 2023-03-16 LAB — CBC
HCT: 27 % — ABNORMAL LOW (ref 36.0–46.0)
Hemoglobin: 8.4 g/dL — ABNORMAL LOW (ref 12.0–15.0)
MCH: 32.6 pg (ref 26.0–34.0)
MCHC: 31.1 g/dL (ref 30.0–36.0)
MCV: 104.7 fL — ABNORMAL HIGH (ref 80.0–100.0)
Platelets: 108 10*3/uL — ABNORMAL LOW (ref 150–400)
RBC: 2.58 MIL/uL — ABNORMAL LOW (ref 3.87–5.11)
RDW: 14.6 % (ref 11.5–15.5)
WBC: 5.8 10*3/uL (ref 4.0–10.5)
nRBC: 0.3 % — ABNORMAL HIGH (ref 0.0–0.2)

## 2023-03-16 LAB — VITAMIN B12: Vitamin B-12: 466 pg/mL (ref 180–914)

## 2023-03-16 LAB — FOLATE: Folate: 29 ng/mL (ref >5.9)

## 2023-03-16 NOTE — Progress Notes (Signed)
HD#13 Subjective:   Summary: Ms Diamond Bowman is a 57 year old female with extensive psychiatric history of schizophrenia , bipolar 2 disorder ,CKD stage 3, osteoporosis ans seizures who presented with after a fall and found to have Left trimalleolar fracture s/p open reduction internal fixation of left trimalleolar ankle fracture with fixation of the posterior lip manual application of stress ankle syndesmosis under fluoroscopy.   Overnight Events: No overnight Event.  Patient remains stable today . Folate and B-12 levels normal. Macrocytic Anemia etiology  unclear at this time,Pt is asymptomatic. Pending SNF placement. Objective:  Vital signs in last 24 hours: Vitals:   03/15/23 0750 03/15/23 2028 03/16/23 0558 03/16/23 0758  BP: 128/85 110/70 115/75 114/72  Pulse: 73 70 77 79  Resp: 16 14 15    Temp: 97.7 F (36.5 C) 98 F (36.7 C) 98.1 F (36.7 C) 98.4 F (36.9 C)  TempSrc: Oral Oral Oral   SpO2: 100% 100% 100% 100%  Weight:      Height:       Supplemental O2: Room Air SpO2: 100 %   Physical Exam:  Constitutional: Laying in bed, ,unclear speech, not in any acute distress Cardiovascular: regular rate and rhythm, no m/r/g Pulmonary/Chest: normal work of breathing on room air, lungs clear to auscultation bilaterally Abdominal: soft, non-tender, non-distended MSK: normal bulk and tone Neuro: Patient with difficult to understand speech but this appears to be her baseline.  Filed Weights   03/03/23 0811 03/04/23 0944  Weight: 42 kg 42 kg    No intake or output data in the 24 hours ending 03/16/23 0851 Net IO Since Admission: 784 mL [03/16/23 0851]  Pertinent Labs:    Latest Ref Rng & Units 03/16/2023    6:43 AM 03/14/2023   12:45 PM 03/12/2023   10:15 AM  CBC  WBC 4.0 - 10.5 K/uL 5.8  5.0  7.3   Hemoglobin 12.0 - 15.0 g/dL 8.4  8.4  8.3   Hematocrit 36.0 - 46.0 % 27.0  26.7  25.8   Platelets 150 - 400 K/uL 108  109  102        Latest Ref Rng & Units 03/09/2023    12:41 PM 03/06/2023    6:07 AM 03/05/2023    9:55 AM  CMP  Glucose 70 - 99 mg/dL 409  811  914   BUN 6 - 20 mg/dL 22  9  12    Creatinine 0.44 - 1.00 mg/dL 7.82  9.56  2.13   Sodium 135 - 145 mmol/L 138  142  144   Potassium 3.5 - 5.1 mmol/L 4.2  4.9  4.0   Chloride 98 - 111 mmol/L 99  102  100   CO2 22 - 32 mmol/L 30  31  31    Calcium 8.9 - 10.3 mg/dL 8.4  8.9  8.5     Imaging: No results found.  Assessment/Plan:   Principal Problem:   Left trimalleolar fracture, closed, initial encounter   Patient Summary: Diamond Bowman is a 58 y.o. with a pertinent PMH of  schizophrenia , bipolar 2 disorder ,CKD stage 3, osteoporosis ans seizures, who presented with mechanical fall  and admitted for Left trimalleolar fracture.    #Left trimalleolar fracture  #Osteoporosis S/P open reduction internal fixation of left trimalleolar ankle fracture with fixation of the posterior lip manual application of stress ankle syndesmosis under fluoroscopy.Ortho is following. Post op xrays looks good. Cast is still in place. - Pending SNF placement. F/U with Social  work   - Ortho recs appreciated - Follow up with ortho in 3 weeks for cast removal  - Request skin checks around cast q Shift  #Itchy Left eye  Complained of itchy left eye,unsure if its new or not.Poor historian due to intellectual disability - Start liquifilm tears   #Elevated BP No hx of HTN. Appears to be 2/2 to problem 1. Plan to monitor for now. Does have prns on board.    #Intellectual disability  #Bipolar 2 disorder #Schizophrenia #Insomnia -Continue home regimen (olanzapine 15 mg daily at bedtime, trazodone 300 mg daily at bedtime, mirtazapine 15 mg daily at bedtime, cyproheptadine 4 mg daily at bedtime, hydroxyzine 50 mg BID)   #Seizure disorder -Continue home depakote 1500 mg daily at bedtime   #Hypothyroidism -Continue home levothyroxine 75 mcg daily   #Hx atrial fibrillation on aspirin Chronic. -Continue home  metoprolol 50 mg daily   #Hx of Anemia  #Thrombocytopenia  Chronic Anemia. CBC shows stable anemia. Hgb is 8.4 today. Ph has a hx of chronic thrombocytopenia. Platelet 109 yesterday .No new labs today .Continue to trend CBC.   Diet: Normal IVF: None,None VTE: None Code: Full PT/OT recs: None, none.  Dispo: Anticipated discharge to Skilled nursing facility .Pending acceptance  Diamond Lime, MD Internal Medicine Resident PGY-1 Please contact the on call pager after 5 pm and on weekends at [phone]

## 2023-03-16 NOTE — Progress Notes (Signed)
Occupational Therapy Treatment Patient Details Name: Diamond Bowman MRN: [id number] DOB: [birth year] Today's Date: 03/16/2023   History of present illness Pt is 57 year old presented to [facility] hospital on  03/03/23 for fall with left leg pain, found to have L ankle trimalleolar fx. Pt underwent ORIF on 10/10. LLE Short leg cast reapplied 10/18. PMH - MDD, Bipolar, Schizophrenia, Intellectual Disability, Tardive Dyskinesia, CKD 3, Hypothyroidism.   OT comments  Pt eating in complete supine position upon entry to room, having difficulty due to laying completely flat. Assisted Pt to upright position with increased time due to agitation when interrupting mealtime. Pt requires set up assistance to open packets of sauce and other items, pouring drink into cup, positioning items in ergonomic positions, etc. Pt tremors and decreased problem solving limit independence with feeding. Pt refused OOB activities. Pt would benefit from continued skilled therapy to maximize participation and independence with ADLs, DC plan to postacute still appropriate.       If plan is discharge home, recommend the following:  Two people to help with walking and/or transfers;Two people to help with bathing/dressing/bathroom;Direct supervision/assist for financial management;Supervision due to cognitive status;Direct supervision/assist for medications management   Equipment Recommendations  Wheelchair (measurements OT);Wheelchair cushion (measurements OT);Other (comment);Hoyer lift;BSC/3in1    Recommendations for Other Services      Precautions / Restrictions Precautions Precautions: Fall Precaution Comments: intellectual disability Required Braces or Orthoses: Splint/Cast Splint/Cast: LLE short leg cast placed by ortho tech Splint/Cast - Date Prophylactic Dressing Applied (if applicable): 03/11/23 Restrictions Weight Bearing Restrictions: Yes LLE Weight Bearing: Non weight bearing       Mobility Bed Mobility Overal  bed mobility: Needs Assistance Bed Mobility: Supine to Sit           General bed mobility comments: scooting back in bed min A for repositioning to complete feeding in upright position.    Transfers Overall transfer level: Needs assistance Equipment used: None               General transfer comment: declined today     Balance Overall balance assessment: Needs assistance Sitting-balance support: No upper extremity supported, Feet supported Sitting balance-Leahy Scale: Fair                                     ADL either performed or assessed with clinical judgement   ADL Overall ADL's : Needs assistance/impaired Eating/Feeding: Set up;Sitting   Grooming: Set up;Sitting                                 General ADL Comments: Pt had mild difficulty with feeding today, tremors make it difficult to open/close lids, rip open dressing packets, difficult to eat with spoon.    Extremity/Trunk Assessment Upper Extremity Assessment Upper Extremity Assessment: Generalized weakness            Vision       Perception     Praxis      Cognition Arousal: Alert Behavior During Therapy: Flat affect, Impulsive Overall Cognitive Status: History of cognitive impairments - at baseline                                 General Comments: able to follow commands inconsistently, impulsive, requires tactile/continued verbal cueing for safety awareness.  Exercises      Shoulder Instructions       General Comments      Pertinent Vitals/ Pain       Pain Assessment Pain Assessment: No/denies pain  Home Living                                          Prior Functioning/Environment              Frequency  Min 1X/week        Progress Toward Goals  OT Goals(current goals can now be found in the care plan section)  Progress towards OT goals: Progressing toward goals  Acute Rehab OT  Goals Patient Stated Goal: not able to participate in goal setting OT Goal Formulation: Patient unable to participate in goal setting Time For Goal Achievement: 03/17/23 Potential to Achieve Goals: Good ADL Goals Pt Will Perform Grooming: sitting;with min assist Pt Will Transfer to Toilet: with min assist;stand pivot transfer;bedside commode Additional ADL Goal #1: pt will demonstrate basic transfer with weight bearing precautions 100% using RW Additional ADL Goal #2: pt will demonstrate basic transfer to w/c min (A) Additional ADL Goal #3: pt will tolerate 5 mins EOB sitting with CGA to promote confidence and in preparation to complete seated ADLs  Plan      Co-evaluation                 AM-PAC OT "6 Clicks" Daily Activity     Outcome Measure   Help from another person eating meals?: A Little Help from another person taking care of personal grooming?: A Little Help from another person toileting, which includes using toliet, bedpan, or urinal?: A Lot Help from another person bathing (including washing, rinsing, drying)?: A Lot Help from another person to put on and taking off regular upper body clothing?: A Little Help from another person to put on and taking off regular lower body clothing?: A Lot 6 Click Score: 15    End of Session    OT Visit Diagnosis: Unsteadiness on feet (R26.81);Muscle weakness (generalized) (M62.81)   Activity Tolerance Patient tolerated treatment well   Patient Left in bed;with call bell/phone within reach;with bed alarm set   Nurse Communication Mobility status        Time: 6283-1517 OT Time Calculation (min): 14 min  Charges: OT General Charges $OT Visit: 1 Visit OT Treatments $Self Care/Home Management : 8-22 mins  [place], OTR/L   Diamond Bowman 03/16/2023, 4:36 PM

## 2023-03-16 NOTE — TOC Progression Note (Signed)
Transition of Care [facility]) - Progression Note    Patient Details  Name: ADAYSHA TREVOR MRN: [id number] Date of Birth: 1965/08/05  Transition of Care [facility]) CM/SW Contact  Zaryia Markel A [place], [place] Phone Number: 03/16/2023, 2:24 PM  Clinical Narrative:     CSW was contacted by Alphonzo Lemmings in admissions at [facility]. She stated that they can extend bed offer to pt, but would need a 30 day LOG. CSW informed TOC leadership of request for any placement at [facility] for pt for admission to their facility. CSW to assist with placement once leadership has informed CSW of decision regarding LOG.  TOC will continue to follow.  Expected Discharge Plan: Skilled Nursing Facility Barriers to Discharge: English as a second language teacher, SNF Pending bed offer  Expected Discharge Plan and Services In-house Referral: Clinical Social Work     Living arrangements for the past 2 months: Single Family Home                                       Social Determinants of Health (SDOH) Interventions SDOH Screenings   Food Insecurity: Patient Unable To Answer (03/03/2023)  Housing: Patient Unable To Answer (03/03/2023)  Transportation Needs: Patient Unable To Answer (03/03/2023)  Utilities: Patient Unable To Answer (03/03/2023)  Tobacco Use: Medium Risk (03/03/2023)    Readmission Risk Interventions     No data to display

## 2023-03-17 DIAGNOSIS — F3181 Bipolar II disorder: Secondary | ICD-10-CM | POA: Diagnosis not present

## 2023-03-17 DIAGNOSIS — S82852A Displaced trimalleolar fracture of left lower leg, initial encounter for closed fracture: Secondary | ICD-10-CM | POA: Diagnosis not present

## 2023-03-17 MED ORDER — ZINC OXIDE 40 % EX OINT: TOPICAL_OINTMENT | Freq: Three times a day (TID) | CUTANEOUS | Status: DC

## 2023-03-17 MED ORDER — POLYVINYL ALCOHOL 1.4 % OP SOLN: 1.0000 [drp] | OPHTHALMIC | Status: DC | PRN

## 2023-03-17 MED ORDER — GERHARDT'S BUTT CREAM: 1.0000 | TOPICAL_CREAM | Freq: Three times a day (TID) | CUTANEOUS | Status: DC

## 2023-03-17 MED ORDER — ZINC OXIDE 40 % EX OINT
TOPICAL_OINTMENT | Freq: Three times a day (TID) | CUTANEOUS | Status: DC
  Filled 2023-03-17: qty 57

## 2023-03-17 NOTE — Discharge Summary (Signed)
Name: Diamond Bowman MRN: [id number] DOB: May 16, 1966 57 y.o. PCP: [facility]  Date of Admission: 03/03/2023  8:05 AM Date of Discharge: 03/17/2023 Attending Physician: Dr.  Vernia Buff ,MD  Discharge Diagnosis: Principal Problem:   Left trimalleolar fracture, closed, initial encounter    Discharge Medications: Allergies as of 03/17/2023       Reactions   Amiodarone Rash   Diffuse rash across torso, back, legs arms (notably started on Xarelto, verapamil and amiodarone at same time - unclear which was culprit)   Rivaroxaban Rash   Diffuse rash across torso, back, legs arms (notably started on Xarelto, verapamil and amiodarone at same time - unclear which was culprit)   Verapamil Rash   Diffuse rash across torso, back, legs arms (notably started on Xarelto, verapamil and amiodarone at same time - unclear which was culprit)        Medication List     TAKE these medications    alendronate 70 MG tablet Commonly known as: FOSAMAX Take 70 mg by mouth every Thursday. Take with a full glass of water on an empty stomach.   cyproheptadine 4 MG tablet Commonly known as: PERIACTIN Take 4 mg by mouth at bedtime.   divalproex 500 MG 24 hr tablet Commonly known as: DEPAKOTE ER Take 3 tablets (1,500 mg total) by mouth at bedtime.   docusate sodium 100 MG capsule Commonly known as: COLACE Take 100 mg by mouth daily.   eucerin lotion Apply 1 Application topically See admin instructions. Apply topically to entire body twice daily   Ferrous Sulfate 140 (45 Fe) MG Tbcr Take 1 tablet by mouth daily.   furosemide 20 MG tablet Commonly known as: LASIX Take 1 tablet (20 mg total) by mouth daily. What changed:  when to take this reasons to take this   Gerhardt's butt cream Crea Apply 1 Application topically 3 (three) times daily.   GNP Aspirin 81 MG tablet Generic drug: aspirin EC TAKE ONE TABLET BY MOUTH DAILY. swallow whole What changed: See the new  instructions.   GNP Calcium 500 +D3 500-15 MG-MCG Tabs Generic drug: Calcium Carb-Cholecalciferol Take 1 tablet by mouth daily.   hydrOXYzine 50 MG capsule Commonly known as: VISTARIL Take 50 mg by mouth 2 (two) times daily. Take 1 tablet in the morning and take 1 tablet at 2pm   KRILL OIL PO Take 100 mg by mouth daily.   levothyroxine 75 MCG tablet Commonly known as: SYNTHROID Take 75 mcg by mouth daily.   liver oil-zinc oxide 40 % ointment Commonly known as: DESITIN Apply topically 3 (three) times daily.   loratadine 10 MG tablet Commonly known as: CLARITIN Take 10 mg by mouth daily.   metoprolol succinate 50 MG 24 hr tablet Commonly known as: TOPROL-XL TAKE ONE TABLET BY MOUTH DAILY take with or immediately following a meal What changed: See the new instructions.   mirtazapine 15 MG tablet Commonly known as: REMERON Take 15 mg by mouth at bedtime.   multivitamin with minerals Tabs tablet Take 1 tablet by mouth daily.   OLANZapine zydis 15 MG disintegrating tablet Commonly known as: ZYPREXA Take 1 tablet (15 mg total) by mouth at bedtime.   polyvinyl alcohol 1.4 % ophthalmic solution Commonly known as: LIQUIFILM TEARS Place 1 drop into both eyes as needed for dry eyes.   traZODone 100 MG tablet Commonly known as: DESYREL Take 300 mg by mouth at bedtime.  Discharge Care Instructions  (From admission, onward)           Start     Ordered   03/17/23 0000  Discharge wound care:       Comments: Irritation in the perianal area addressed with Desitin ointment   03/17/23 1436            Disposition and follow-up:   Diamond Bowman was discharged from [facility] in Stable condition.  At the hospital follow up visit please address:  1.  Follow-up:  A. L trimalleolar fracture, closed     Osteoporosis - Ensure follow up with orthopedic surgery - Monitor for signs and symptoms of ankle pain - Monitor for healing  signs    B.Intellectual disability Bipolar 2 disorder Schizophrenia Insomnia - Patient on high risk medications, monitor for side effects   C.Seizure disorder  - continued on prior to admission medications   D.Hx atrial fibrillation on aspirin  - Patient not longer on DOAC after shared decision making  - Continue on 81 mg ASA daily   E.Macrocytic anemia  Normal folate and B12  Continue monitoring     2.  Labs / imaging needed at time of follow-up: None  3.  Pending labs/ test needing follow-up: None  4.  Medication Changes  Added barrier cream for her perianal area    Follow-up Appointments:  Contact information for follow-up providers     Myrene Galas, MD. Schedule an appointment as soon as possible for a visit in 3 week(s).   Specialty: Orthopedic Surgery Contact information: [address] [place] [address] [phone]              Contact information for after-discharge care     Destination     [facility] .   Service: Skilled Nursing Contact information: [address] [place] [address] [phone]                     Hospital Course by problem list:  L trimalleolar fracture, closed Osteoporosis On day of admission, patient's LLE was placed in a soft cast and she was given tylenol for pain management. Orthopedic surgery was consulted when imaging showed possible trimalleolar fracture and soft tissue swelling. Patient received preoperative antibiotics and a regional block. Dr. Carola Frost with orthopedics performed a ORIF of L ankle on 10/10 and patient's LLE was placed in a splint. She was initially non-weight bearing for the first 3-5 days post surgery. It was changed to a short cast on 10/17. Her pain was initially managed with PRN Tylenol, oxycodone and dilaudid. Tylenol later scheduled as patient didn't always consistently communicate her pain needs. Dilaudid was  discontinued when pain improved. Patient's home alendronate 70 mg weekly (given on Thursdays) was administered on 10/17 with precautions. Her home calcium carbonate-cholecalciferol 500-15 mg-mcg daily, MV one tablet daily was resumed on admission. Patient was discharged with scheduled Tylenol and a short course of oxy 5 mg q6H for moderate pain. Repeat imaging was performed 10 days post surgery which revealed unchanged alignment of distal fibular and medial malleolar fractures post ORIF. No hardware complication.PT/OT helped evaluate patient and recommended SNF. SW and case manager assisted with placement.   Intellectual disability Bipolar 2 disorder Schizophrenia Insomnia Patient was able to communicate most needs verbally. She was interactive during the admission and was observed making requests for food or items, and she was also cooperative during daily rounds by following  simple prompts. Her home medications for bipolar 2 disorder, schizophrenia, and insomnia were resumed on admission and included olanzapine 15 mg daily at bedtime, trazodone 300 mg daily at bedtime, mirtazapine 15 mg daily at bedtime, cyproheptadine 4 mg daily at bedtime, hydroxyzine 50 mg BID. No medication changes to her home regimen were made during this admission.    Seizure disorder Patient with history of seizure disorder. Did not present with any seizure-like events during hospitalization. Resumed her home depakote 1500 mg daily at bedtime on admission.    Hypothyroidism Chronic. TSH checked 10/12, 2.791 WNL. Resumed home levothyroxine 75 mcg daily on admission.   Hx atrial fibrillation on aspirin Cardiology note 11/2021 alludes to episode of afib potentially due to severe sepsis June 2023. Patient and patient's family shared decision making and stopped Eliquis. Transitioned to daily aspirin 81 mg. Resumed daily aspirin and metoprolol 50 mg daily on admission. Patient with RRR and heart rate is less ranging 98.1-98.5 on day  of discharge.  Macrocytic anemia Stable during this admission. No evidence of B12 or folate deficiencies.  Discharge Subjective: Patient laying in bed and watching eat while eating her breakfast. She didn't seem to be in any acute distress. No new major changes in ter,Diamond of her mentation and intellectual disability. Patient nods head when we explained plan of placement to SNF.   Discharge Exam:   BP 136/69   Pulse 72   Temp 98.3 F (36.8 C)   Resp 20   Ht 4\' 6"  (1.372 m)   Wt 42 kg   LMP  (LMP Unknown)   SpO2 100%   BMI 22.33 kg/m   Physical Exam:   Constitutional: Laying in bed, not in acute distress Cardiovascular: regular rate and rhythm, no m/r/g Pulmonary/Chest: normal work of breathing on room air, lungs clear to auscultation bilaterally Abdominal: soft, non-tender, non-distended Neuro: Intellectually disable . Mumbling speech   Pertinent Labs, Studies, and Procedures:     Latest Ref Rng & Units 03/16/2023    6:43 AM 03/14/2023   12:45 PM 03/12/2023   10:15 AM  CBC  WBC 4.0 - 10.5 K/uL 5.8  5.0  7.3   Hemoglobin 12.0 - 15.0 g/dL 8.4  8.4  8.3   Hematocrit 36.0 - 46.0 % 27.0  26.7  25.8   Platelets 150 - 400 K/uL 108  109  102        Latest Ref Rng & Units 03/09/2023   12:41 PM 03/06/2023    6:07 AM 03/05/2023    9:55 AM  CMP  Glucose 70 - 99 mg/dL 563  875  643   BUN 6 - 20 mg/dL 22  9  12    Creatinine 0.44 - 1.00 mg/dL 3.29  5.18  8.41   Sodium 135 - 145 mmol/L 138  142  144   Potassium 3.5 - 5.1 mmol/L 4.2  4.9  4.0   Chloride 98 - 111 mmol/L 99  102  100   CO2 22 - 32 mmol/L 30  31  31    Calcium 8.9 - 10.3 mg/dL 8.4  8.9  8.5     DG Ankle Complete Left  Result Date: 03/04/2023 CLINICAL DATA:  Fracture, postop. EXAM: LEFT ANKLE COMPLETE - 3+ VIEW COMPARISON:  Preoperative exam. FINDINGS: Lateral plate and multi screw fixation of distal fibular fracture. Screw traverses medial malleolar fracture. Screw traverses posterior malleolar fracture.  Improved fracture alignment from preoperative imaging. Overlying splint material limits osseous and soft tissue fine detail. IMPRESSION: Status post  ORIF of trimalleolar fracture. Improved fracture alignment from preoperative imaging. Electronically Signed   By: Narda Rutherford M.D.   On: 03/04/2023 17:07   DG Ankle Complete Left  Result Date: 03/04/2023 CLINICAL DATA:  Elective surgery. EXAM: LEFT ANKLE COMPLETE - 3+ VIEW COMPARISON:  Preoperative imaging. FINDINGS: Six fluoroscopic spot views of the left ankle obtained in the operating room. Lateral plate and multi screw fixation of distal fibular fracture. Screw traverses the medial malleolus. Screw traverses the posterior malleolar fracture. Fluoroscopy time 51.7 seconds. Dose 1.65 mGy. IMPRESSION: Intraoperative fluoroscopy during ORIF of distal tibia and fibula fractures. Electronically Signed   By: Narda Rutherford M.D.   On: 03/04/2023 17:02   DG C-Arm 1-60 Min-No Report  Result Date: 03/04/2023 Fluoroscopy was utilized by the requesting physician.  No radiographic interpretation.   DG C-Arm 1-60 Min-No Report  Result Date: 03/04/2023 Fluoroscopy was utilized by the requesting physician.  No radiographic interpretation.   DG Ankle Complete Left  Result Date: 03/03/2023 CLINICAL DATA:  Pain after fall EXAM: LEFT ANKLE COMPLETE - 3 VIEW COMPARISON:  None Available. FINDINGS: Soft tissue swelling about the ankle. Slightly displaced oblique fracture of the distal fibular metaphysis. Mildly displaced additional fracture of the base of the medial malleolus. No dislocation. Preserved bone mineralization. There also deformity of the posterior aspect of the distal tibia. Additional area of injury. IMPRESSION: Possible trimalleolar fracture.  Soft tissue swelling Electronically Signed   By: Karen Kays M.D.   On: 03/03/2023 10:35     Discharge Instructions:  Discharge Instructions     Call MD for:  difficulty breathing, headache or visual  disturbances   Complete by: As directed    Call MD for:  extreme fatigue   Complete by: As directed    Call MD for:  persistant dizziness or light-headedness   Complete by: As directed    Call MD for:  persistant nausea and vomiting   Complete by: As directed    Call MD for:  redness, tenderness, or signs of infection (pain, swelling, redness, odor or green/yellow discharge around incision site)   Complete by: As directed    Call MD for:  severe uncontrolled pain   Complete by: As directed    Call MD for:  temperature >100.4   Complete by: As directed    Diet - low sodium heart healthy   Complete by: As directed    Discharge instructions   Complete by: As directed    Dear Diamond Bowman, It was a pleasure taking care of you at [facility]. You were admitted for fall and treated for left trimalleolar fracture . We are discharging you home now that you are doing better. Please follow the following instructions.    You were seen for left ankle pain and left trimalleolar fracture after a fall at home on 10/08. Your fracture surgically treated by an open reduction and internal fixation (ORIF) on 10/10. This means your orthopedic surgeon, Dr. Carola Frost, realigned the broken bone and stabilized it.    - Your splint was converted to a short leg cast on 10/17. You may bear weight on your left leg. You will need outpatient follow up with orthopedic surgery for repeat imaging 10 days after surgery.Orthopedic surgery will reach out to you to arrange the appointment.   - We will provide a short course of pain medication to help manage your left leg pain. If you need additional pain medications, please reach out to your primary care provider.   -  You will continue to work with physical therapy and occupational therapy at your skilled nursing facility.   - We did not make any changes to your home medications, which were continued when you were admitted to the hospital.   - Please schedule a  hospitalization follow up appointment with your primary care provider within the next 10-14 days so they can review your current health needs at that time.   - Please call 911 or go to an emergency room to get evaluated if you have any uncontrolled bleeding, recurrent fall, fever, nausea/vomiting that prevents you from keeping down food, or uncontrolled pain.   Take care,  Dr. Kathleen Lime, MD   Discharge wound care:   Complete by: As directed    Irritation in the perianal area addressed with Desitin ointment   Increase activity slowly   Complete by: As directed        Signed: Kathleen Lime, MD Morene Crocker, MD Internal Medicine Teaching Service Pager [phone]

## 2023-03-17 NOTE — Progress Notes (Signed)
Physical Therapy Treatment Patient Details Name: Diamond Bowman MRN: [id number] DOB: 10/05/1965 Today's Date: 03/17/2023   History of Present Illness Pt is 57 year old presented to [facility] hospital on  03/03/23 for fall with left leg pain, found to have L ankle trimalleolar fx. Pt underwent ORIF on 10/10. LLE Short leg cast reapplied 10/18. PMH - MDD, Bipolar, Schizophrenia, Intellectual Disability, Tardive Dyskinesia, CKD 3, Hypothyroidism.    PT Comments  Pt received in supine, RN clearance for session and pt agreeable but anxious and needs dense multimodal cues for bed mobility and lateral seated scooting along EOB. Pt tangential and with difficulty attending to task this date. Worked on seated balance, encouraged pt to attempt standing on single leg for strengthening/balance but pt defers due to LLE pain and difficulty with NWB on LLE. Pt needs frequent reminders and tactile/verbal cues to avoid LLE weight bearing during bed mobility. Currently needing up to minA for lateral scooting with bed pad assist and she is likely safer to be scooting OOB to wheelchair/drop arm recliner vs hoyer at this time, unit staff notified/aware. RN notified she requests pain meds. PTA set up her tray for lunch and bed in chair posture. Pt continues to benefit from PT services to progress toward functional mobility goals.    If plan is discharge home, recommend the following: Two people to help with walking and/or transfers;Two people to help with bathing/dressing/bathroom;Assistance with cooking/housework;Assistance with feeding;Direct supervision/assist for medications management;Direct supervision/assist for financial management;Assist for transportation;Help with stairs or ramp for entrance;Supervision due to cognitive status   Can travel by private vehicle     No  Equipment Recommendations  Wheelchair (measurements PT);Wheelchair cushion (measurements PT);Hospital bed;Hoyer lift;Other (comment) (removable arm and  leg rests; smaller size)    Recommendations for Other Services       Precautions / Restrictions Precautions Precautions: Fall Precaution Comments: intellectual disability Required Braces or Orthoses: Splint/Cast Splint/Cast: LLE short leg cast placed by ortho tech Splint/Cast - Date Prophylactic Dressing Applied (if applicable): 03/11/23 Restrictions Weight Bearing Restrictions: Yes LLE Weight Bearing: Non weight bearing Other Position/Activity Restrictions: non-compliant during bed mobility     Mobility  Bed Mobility Overal bed mobility: Needs Assistance Bed Mobility: Supine to Sit, Sit to Supine Rolling: Modified independent (Device/Increase time), Used rails   Supine to sit: Min assist Sit to supine: Contact guard assist   General bed mobility comments: Increased time and encouragement to initiate bed mobility today, pt guarding LLE more today due to pain, RN notified. minA for trunk raising to sit EOB    Transfers Overall transfer level: Needs assistance Equipment used: None Transfers: Bed to chair/wheelchair/BSC            Lateral/Scoot Transfers: Min assist General transfer comment: Increased time/cues to initiate and perform. Worked on lateral scooting EOB multiple reps toward her L side. Defer OOB transfer to chair today as per chart review, pt to be discharged and case mgmt calling [facility] for transport. Pt food tray arrived during session so pt positioned in bed chair posture to eat more upright, pt tends to R lean so pillow placed under R elbow to promote neutral posture.    Ambulation/Gait                   Stairs             Wheelchair Mobility     Tilt Bed    Modified Rankin (Stroke Patients Only)       Balance Overall  balance assessment: Needs assistance Sitting-balance support: No upper extremity supported, Feet supported Sitting balance-Leahy Scale: Fair Sitting balance - Comments: EOB       Standing balance comment: defer  today, worked on lateral scoot transfer safety. Encouraged pt to attempt STS with single leg but she defers due to c/o LLE pain                            Cognition Arousal: Alert Behavior During Therapy: Flat affect, Impulsive Overall Cognitive Status: History of cognitive impairments - at baseline                                 General Comments: able to follow commands inconsistently, impulsive, requires tactile/continued verbal cueing for safety awareness. non-compliant with LLE NWB during bed mobility.        Exercises Other Exercises Other Exercises: seated BLE A/AAROM: hip flexion, LAQ x10 reps ea (verbal + tactile cues due to cognitive deficit)    General Comments General comments (skin integrity, edema, etc.): New bed pad placed under pt due to soiling and PTA assisted with hygiene when pt rolls for new bed pad. Briefs in room and placed on pt as she is incontinent and likely to DC soon.      Pertinent Vitals/Pain Pain Assessment Pain Assessment: Faces Faces Pain Scale: Hurts even more Pain Location: L foot/LLE Pain Descriptors / Indicators: Grimacing, Guarding Pain Intervention(s): Limited activity within patient's tolerance, Monitored during session, Repositioned, Patient requesting pain meds-RN notified, Other (comment) (LLE elevated on yellow bone foam)    Home Living                          Prior Function            PT Goals (current goals can now be found in the care plan section) Acute Rehab PT Goals Patient Stated Goal: pt does not state, supervising PT goal to progress to transfer training with NWB LLE PT Goal Formulation: With patient Time For Goal Achievement: 03/19/23 Progress towards PT goals: Progressing toward goals    Frequency    Min 1X/week      PT Plan      Co-evaluation              AM-PAC PT "6 Clicks" Mobility   Outcome Measure  Help needed turning from your back to your side while in a  flat bed without using bedrails?: A Little Help needed moving from lying on your back to sitting on the side of a flat bed without using bedrails?: A Little Help needed moving to and from a bed to a chair (including a wheelchair)?: A Little Help needed standing up from a chair using your arms (e.g., wheelchair or bedside chair)?: A Lot Help needed to walk in hospital room?: Total Help needed climbing 3-5 steps with a railing? : Total 6 Click Score: 13    End of Session   Activity Tolerance: Patient limited by pain Patient left: in bed;with call bell/phone within reach;with bed alarm set;Other (comment) (bed chair posture set up to eat) Nurse Communication: Mobility status;Patient requests pain meds PT Visit Diagnosis: Other abnormalities of gait and mobility (R26.89);History of falling (Z91.81);Pain Pain - Right/Left: Left Pain - part of body: Ankle and joints of foot     Time: 1430-1457 PT Time Calculation (min) (ACUTE ONLY):  27 min  Charges:    $Therapeutic Exercise: 8-22 mins $Therapeutic Activity: 8-22 mins PT General Charges $$ ACUTE PT VISIT: 1 Visit                     Panagiota Perfetti P., PTA Acute Rehabilitation Services Secure Chat Preferred 9a-5:30pm Office: [phone]    Dorathy Kinsman [facility] 03/17/2023, 4:07 PM

## 2023-03-17 NOTE — Progress Notes (Signed)
Subjective:   Summary: Diamond Bowman is a 57 year old female with extensive psychiatric history of schizophrenia , bipolar 2 disorder ,CKD stage 3, osteoporosis ans seizures who presented with after a fall and found to have Left trimalleolar fracture s/p open reduction internal fixation of left trimalleolar ankle fracture with fixation of the posterior lip manual application of stress ankle syndesmosis under fluoroscopy.   Overnight Events: No overnight event  Patient remain stable. Had a lab holiday. Social work is still looking for a facility for her. Her surgical site remain clean, dry and intact. Will follow up with case worker on pending SNF placement.  Objective:  Vital signs in last 24 hours: Vitals:   03/16/23 1609 03/16/23 1900 03/17/23 0454 03/17/23 0741  BP: (!) 131/57 (!) 139/42 (!) 149/41 136/69  Pulse: 85 75 86 72  Resp:  19 20   Temp: 98.1 F (36.7 C) 98.3 F (36.8 C) 98.5 F (36.9 C) 98.3 F (36.8 C)  TempSrc:  Oral Oral   SpO2: 97% 100% 98% 100%  Weight:      Height:       Supplemental O2: Room Air SpO2: 100 %   Physical Exam:  Constitutional: Laying in bed, not in acute distress Cardiovascular: regular rate and rhythm, no m/r/g Pulmonary/Chest: normal work of breathing on room air, lungs clear to auscultation bilaterally Abdominal: soft, non-tender, non-distended Neuro: Intellectually disable . Mumbling speech   Filed Weights   03/03/23 0811 03/04/23 0944  Weight: 42 kg 42 kg    No intake or output data in the 24 hours ending 03/17/23 1308 Net IO Since Admission: 784 mL [03/17/23 1308]  Pertinent Labs:    Latest Ref Rng & Units 03/16/2023    6:43 AM 03/14/2023   12:45 PM 03/12/2023   10:15 AM  CBC  WBC 4.0 - 10.5 K/uL 5.8  5.0  7.3   Hemoglobin 12.0 - 15.0 g/dL 8.4  8.4  8.3   Hematocrit 36.0 - 46.0 % 27.0  26.7  25.8   Platelets 150 - 400 K/uL 108  109  102        Latest Ref Rng & Units 03/09/2023   12:41 PM 03/06/2023    6:07 AM  03/05/2023    9:55 AM  CMP  Glucose 70 - 99 mg/dL 253  664  403   BUN 6 - 20 mg/dL 22  9  12    Creatinine 0.44 - 1.00 mg/dL 4.74  2.59  5.63   Sodium 135 - 145 mmol/L 138  142  144   Potassium 3.5 - 5.1 mmol/L 4.2  4.9  4.0   Chloride 98 - 111 mmol/L 99  102  100   CO2 22 - 32 mmol/L 30  31  31    Calcium 8.9 - 10.3 mg/dL 8.4  8.9  8.5     Imaging: No results found.  Assessment/Plan:   Principal Problem:   Left trimalleolar fracture, closed, initial encounter   Patient Summary: Diamond Bowman is a 57 y.o. with a pertinent PMH of  schizophrenia , bipolar 2 disorder ,CKD stage 3, osteoporosis ans seizures, who presented with mechanical fall  and admitted for Left trimalleolar fracture.   #Left trimalleolar fracture  #Osteoporosis S/P open reduction internal fixation of left trimalleolar ankle fracture with fixation of the posterior lip manual application of stress ankle syndesmosis under fluoroscopy.Ortho is following. Post op xrays looks good. Cast is still in place.Repeat imaging on 10/21 showed unchanged alignment of distal  fibular and medial malleolar fractures post ORIF. No hardware complication. Surgical site looks dry ,clean and intact. - Pending SNF placement. F/U with Social work   - Follow up with ortho in 3 weeks for cast removal  - Request skin checks around cast q Shift  #Elevated BP BP today is 149/41.No hx of HTN. Appears to be 2/2 to problem 1. Plan to monitor for now. Does have prns on board.    #Intellectual disability  #Bipolar 2 disorder #Schizophrenia #Insomnia -Continue home regimen (olanzapine 15 mg daily at bedtime, trazodone 300 mg daily at bedtime, mirtazapine 15 mg daily at bedtime, cyproheptadine 4 mg daily at bedtime, hydroxyzine 50 mg BID).   #Seizure disorder -Continue home depakote 1500 mg daily at bedtime   #Hypothyroidism -Continue home levothyroxine 75 mcg daily   #Hx atrial fibrillation on aspirin Chronic. -Continue home metoprolol 50 mg  daily   #Hx of Anemia  #Thrombocytopenia  Chronic Anemia. CBC shows stable anemia. Hgb is 8.4 today. Ph has a hx of chronic thrombocytopenia. Platelet 109 yesterday .No new labs today .Continue to trend CBC.   Diet: Normal IVF: None,None VTE: None Code: Full PT/OT recs: None, none.  Dispo: Anticipated discharge to Skilled nursing facility .Pending acceptance  Kathleen Lime, MD Internal Medicine Resident PGY-1 Please contact the on call pager after 5 pm and on weekends at [phone]

## 2023-03-17 NOTE — Progress Notes (Signed)
Report called to [facility] at [facility]

## 2023-03-17 NOTE — TOC Transition Note (Signed)
Transition of Care [facility]) - CM/SW Discharge Note   Patient Details  Name: Diamond Bowman MRN: [id number] Date of Birth: [birth year]  [facility] CM/SW Contact:  Taylor Levick A [place], LCSWA Phone Number: 03/17/2023, 2:37 PM   Clinical Narrative:     Patient will DC to: [facility]  Anticipated DC date: 03/17/23  Family notified: Aura Dials, Guardian  Transport by: Sharin Mons      Per MD patient ready for DC to [facility]. RN, patient, patient's family, and facility notified of DC. Discharge Summary and FL2 sent to facility. RN to call report prior to discharge [phone]). DC packet on chart. Ambulance transport requested for patient.     CSW will sign off for now as social work intervention is no longer needed. Please consult [place] again if new needs arise.   Final next level of care: Skilled Nursing Facility Barriers to Discharge: Barriers Resolved   Patient Goals and CMS Choice      Discharge Placement                Patient chooses bed at:  [facility]) Patient to be transferred to facility by: [facility] Name of family member notified: Aura Dials Patient and family notified of of transfer: 03/17/23  Discharge Plan and Services Additional resources added to the After Visit Summary for   In-house Referral: Clinical Social Work                                   Social Determinants of Health (SDOH) Interventions SDOH Screenings   Food Insecurity: Patient Unable To Answer (03/03/2023)  Housing: Patient Unable To Answer (03/03/2023)  Transportation Needs: Patient Unable To Answer (03/03/2023)  Utilities: Patient Unable To Answer (03/03/2023)  Tobacco Use: Medium Risk (03/03/2023)     Readmission Risk Interventions     No data to display

## 2023-06-14 ENCOUNTER — Ambulatory Visit: Payer: MEDICAID | Attending: Internal Medicine | Admitting: Physical Therapy

## 2023-06-14 ENCOUNTER — Encounter: Payer: Self-pay | Admitting: Physical Therapy

## 2023-06-14 ENCOUNTER — Ambulatory Visit: Payer: MEDICAID | Admitting: Occupational Therapy

## 2023-06-14 ENCOUNTER — Other Ambulatory Visit: Payer: Self-pay

## 2023-06-14 VITALS — BP 122/55 | HR 64

## 2023-06-14 DIAGNOSIS — M6281 Muscle weakness (generalized): Secondary | ICD-10-CM | POA: Diagnosis present

## 2023-06-14 DIAGNOSIS — R278 Other lack of coordination: Secondary | ICD-10-CM

## 2023-06-14 DIAGNOSIS — R269 Unspecified abnormalities of gait and mobility: Secondary | ICD-10-CM | POA: Insufficient documentation

## 2023-06-14 NOTE — Therapy (Unsigned)
OUTPATIENT OCCUPATIONAL THERAPY NEURO EVALUATION  Patient Name: Diamond Bowman MRN: [id number] DOB:1965/10/05, 58 y.o., female Today's Date: 06/14/2023  PCP: [facility]  REFERRING PROVIDER: Waldon Reining, DO   END OF SESSION:  OT End of Session - 06/14/23 1344     Visit Number 1    Number of Visits 1    Authorization Type [employer] 2025    OT Start Time [phone]    OT Stop Time 1015    OT Time Calculation (min) 42 min    Activity Tolerance Patient tolerated treatment well    Behavior During Therapy [facility] for tasks assessed/performed             Past Medical History:  Diagnosis Date   Anemia    Bipolar 2 disorder (HCC) 05/14/2012   Chronic kidney disease    stage 3   Chronic kidney disease, stage 3 (HCC) 01/04/2015   Encephalopathy 01/04/2015   Fall    Heart murmur    stable for yrs-from childhood.   Hypothyroidism 05/14/2012   Intellectual disability 05/14/2012   Mental retardation    Monteggia's fracture 03/31/2019   Osteoporosis    Schizophrenia (HCC)    Seizures (HCC)    last one > 5 yrs   Tardive dyskinesia    Thrombocytopenic disorder (HCC) 05/06/2020   Tinea pedis 11/04/2021   Venous stasis syndrome 11/04/2021   Past Surgical History:  Procedure Laterality Date   I & D EXTREMITY Right 03/13/2019   Procedure: IRRIGATION AND DEBRIDEMENT EXTREMITY;  Surgeon: Ernest Mallick, MD;  Location: [facility] OR;  Service: Orthopedics;  Laterality: Right;   ORIF ANKLE FRACTURE Left 03/04/2023   Procedure: OPEN REDUCTION INTERNAL FIXATION (ORIF) ANKLE FRACTURE;  Surgeon: Myrene Galas, MD;  Location: [facility] OR;  Service: Orthopedics;  Laterality: Left;   ORIF ULNAR FRACTURE Right 03/13/2019   Procedure: OPEN REDUCTION INTERNAL FIXATION (ORIF) ULNAR FRACTURE;  Surgeon: Ernest Mallick, MD;  Location: [facility] OR;  Service: Orthopedics;  Laterality: Right;   RADIAL HEAD ARTHROPLASTY Right 03/13/2019   Procedure: RADIAL HEAD ARTHROPLASTY;   Surgeon: Ernest Mallick, MD;  Location: [facility] OR;  Service: Orthopedics;  Laterality: Right;   Patient Active Problem List   Diagnosis Date Noted   Left trimalleolar fracture, closed, initial encounter 03/03/2023   Community acquired pneumonia    Inclusion cyst 11/11/2021   Hyperkalemia 11/10/2021   Acute maculopapular rash 11/08/2021   Closed nondisplaced fracture of distal phalanx of right index finger 11/05/2021   Atrial fibrillation (HCC) 11/04/2021   Thrombocytopenic disorder (HCC) 05/06/2020   Chronic kidney disease, stage 3 (HCC) 01/04/2015   Schizophrenia (HCC) 12/28/2014   Intellectual disability 05/14/2012   Seizure disorder (HCC) 05/14/2012   Bipolar 2 disorder (HCC) 05/14/2012   Hypothyroidism 05/14/2012   Osteoporosis 05/14/2012    ONSET DATE: 06/08/2023 (referral date)  REFERRING DIAG:  S82.852S (ICD-10-CM) - Displaced trimalleolar fracture of left lower leg, sequela  M26.81 (ICD-10-CM) - Anterior soft tissue impingement  R27.8 (ICD-10-CM) - Other lack of coordination    THERAPY DIAG:  Muscle weakness (generalized)  Other lack of coordination  Rationale for Evaluation and Treatment: Rehabilitation  SUBJECTIVE:   SUBJECTIVE STATEMENT: OT updated medication list per caregiver report.  Pt's caregiver reported pt had surgery on ankle after fall in October. Pt attended rehab center at [facility]. Pt returned home on May 30, 2023.   Pt's caregiver reported pt saw ortho MD last week who stated pt can discontinue walker.  Pt's caregiver reported pt refuses to give up walker at this time. Per caregiver, pt has history of preference for holding onto objects.   Per caregiver, pt is "more back to baseline" and requires supervision assistance for showering, walking, dressing, and toileting primarily for safety.  Per caregiver, pt attends daily Day Program from 8 AM to 4 PM.  Pt accompanied by: self and live-in therapeutic provider [name])  PERTINENT  HISTORY: Per 03/17/23 PTA Progress Notes: "03/03/23 for fall with left leg pain, found to have L ankle trimalleolar fx. Pt underwent ORIF on 10/10. LLE Short leg cast reapplied 10/18. PMH - MDD, Bipolar, Schizophrenia, Intellectual Disability, Tardive Dyskinesia, CKD 3, Hypothyroidism." Hx of Seizure disorder.  Per 06/08/23 Referral: "strength ADL training"   PRECAUTIONS: Fall, hx of seizure disorder, intellectual disability  WEIGHT BEARING RESTRICTIONS: No  PAIN:  Are you having pain? No  FALLS: Has patient fallen in last 6 months? Yes. Number of falls 1  (03/03/23 for fall with left leg pain, found to have L ankle trimalleolar fx. Pt underwent ORIF on 10/10.)  LIVING ENVIRONMENT: Lives with: with live-in caregiver Lives in: House/apartment Stairs: Yes: External: 1 (approx. 4-inches) steps; none Has following equipment at home: Dan Humphreys - 2 wheeled, Wheelchair (manual), and Tour manager  PLOF: supervision assistance for showering. Ind with walking, dressing.  PATIENT GOALS: Per caregiver, "to wean away from walker. She will pick it up and carry it around."  OBJECTIVE:  Note: Objective measures were completed at Evaluation unless otherwise noted.  HAND DOMINANCE: Right  ADLs: Subjective information provided by Caregiver: Overall ADLs: ind with some supervision for safety Transfers/ambulation related to ADLs: Eating: ind, wiped mouth ind with napkin Grooming: assistance required, same as baseline - Pt can ind brush hair with downward strokes (x2 strokes) UB Dressing: Pt ind removed jacket and manipulated zipper. Pt ind for UB dressing. LB Dressing: Pt ind for pants, shoes, socks. Pt wears orthopedic shoes with Velcro.  Toileting: Pt ind for wiping, managing clothing. Supervision to monitor how much toilet tissue is used. Sometimes incidents of incontinence and therefore pt wears adult diapers.  Pt sometimes goes to restroom ind. Per caregiver, episodes of incontinence or walking to  restroom "Depends. It's one of her behaviors." Bathing: Supervision for safety, has shower chair though pt does not use Tub Shower transfers: supervision Equipment: Shower seat with back and Walk in shower  IADLs: Subjective information provided by Caregiver:  Overall IADLs: dependent, same as baseline Light housekeeping: Pt wipes off tables, sweep floor, laundry - place/remove clothes in washer/dryer (caregiver starts washer/dryer), folding clothes, making bed. Pt's caregiver reported pt is not participating as readily in tasks since returning from the hospital, pt will say "I can't get it." Pt having difficulty with making bed and laundry.  Handwriting: Pt made large circular drawing strokes across pages in coloring book using standard pen.  MOBILITY STATUS:  ambulates with 2-wheeled walker  POSTURE COMMENTS:  rounded shoulders and forward head Sitting balance:  ind  ACTIVITY TOLERANCE: Activity tolerance: Pt reported increased fatigue since returning from hospital. Pt's caregiver reported "she's a lot weaker than what she was."  FUNCTIONAL OUTCOME MEASURES: PSFS: 3.7    UPPER EXTREMITY ROM:    Active ROM Right eval Left eval  Shoulder flexion [facility]  Shoulder abduction    Shoulder adduction    Shoulder extension    Shoulder internal rotation    Shoulder external rotation    Elbow flexion    Elbow extension  Wrist flexion    Wrist extension    Wrist ulnar deviation    Wrist radial deviation    Wrist pronation    Wrist supination    (Blank rows = not tested)   HAND FUNCTION: Grip strength: Right: 10 lbs; Left: 16 lbs  COORDINATION: Box and Blocks:  Right 21blocks, Left 21blocks  Pt's caregiver reported pt's coordination is "off some" d/t difficulty picking up crayons for drawing tasks. OT noted pt dropped pen 1x today.  SENSATION: Not tested  EDEMA: none noted  MUSCLE TONE: RUE: Within functional limits and LUE: Within functional limits  Some ataxic  movements "shaking" of B hands with volitional movements.  COGNITION: Overall cognitive status: History of cognitive impairments - at baseline Hx of intellectual disability. Pt followed 1-step and 2-step directions today. Pt's caregiver provided additional subjective information. Pt often asked about food and lunch though pt was easily redirected to a task PRN. Pt answered questions with repeated verbal instructions.  VISION: Not tested. Pt navigated obstacles effectively in environment.  PERCEPTION: Not tested  PRAXIS: Not tested  OBSERVATIONS: Pt ambulated ind with 2-wheeled walker. Pt demo'd forward head posture and rounded shoulders. Pt was pleasant and caregiver provided subjective information on behalf of pt secondary to hx of intellectual disability. Pt appeared well-kept. Pt frequently donned/doffed jacket throughout therapy session. Pt's caregiver reported this behavior was typical. Pt came with coloring book and seemed to enjoy making marks on page.                                                                                                                            TREATMENT DATE:  Eval only  PATIENT EDUCATION: Education details: OT role, POC Person educated: Patient and Engineer, structural  - live-in therapeutic provider [name]) Education method: Explanation Education comprehension: verbalized understanding  HOME EXERCISE PROGRAM: N/A   GOALS: Goals reviewed with patient? Yes - OT reviewed general goals with pt and pt's caregiver, including designing an HEP program, fatigue management, IADL laundry and making bed tasks, BUE strengthening, BUE FM coordination, and PSFS items. Pt's caregiver agreed with goals.   However, per PT: During PT eval (which took place after OT eval), pt's caregiver instead requested to pursue home health options (see PT eval notes for further details). Therefore, per caregiver request, pt and caregiver to seek home health OT/PT options instead. Therefore,  OT did not finalize specific goals.   ASSESSMENT:  CLINICAL IMPRESSION: Patient is a 58 y.o. female who was seen today for occupational therapy evaluation for displaced trimalleolar fracture of LLE, anterior soft tissue impingement, other lack of coordination. Hx includes ankle trimalleolar fx with subsequent ORIF in 02/2023, MDD, bipolar, schizophrenia, intellectual disability, tardive dyskinesia, CKD 3, hypothyroidism, hx of seizure. Patient currently presents below baseline level of functioning demonstrating functional deficits and impairments as noted below.   Per 06/14/23 PT eval notes, "Due to patient and caregiver preference and likely improved carryover in familiar home environment, do not  advise outpatient PT services at this time but recommend Home Health PT. Caregivers concerns for increased behavioral challenges in outpatient setting with fatigue also make HH most appropriate at this time."   Following discussion with PT and per caregiver request during PT eval (see PT notes for additional details), OT concurs for advising [facility] OT services d/t behavioral considerations/concerns, pt's preference for familiar settings, and likelihood of improved carryover for daily functional tasks in familiar home environment.  PERFORMANCE DEFICITS: in functional skills including ADLs, IADLs, coordination, dexterity, tone, ROM, strength, Fine motor control, Gross motor control, mobility, balance, body mechanics, endurance, continence, decreased knowledge of precautions, decreased knowledge of use of DME, and UE functional use, cognitive skills including attention, energy/drive, learn, memory, problem solving, safety awareness, and sequencing, and psychosocial skills including environmental adaptation and routines and behaviors.   IMPAIRMENTS: are limiting patient from ADLs, IADLs, leisure, and social participation.   CO-MORBIDITIES: may have co-morbidities  that affects occupational performance. Patient will  benefit from skilled OT to address above impairments and improve overall function.  MODIFICATION OR ASSISTANCE TO COMPLETE EVALUATION: Min-Moderate modification of tasks or assist with assess necessary to complete an evaluation.  OT OCCUPATIONAL PROFILE AND HISTORY: Problem focused assessment: Including review of records relating to presenting problem.  CLINICAL DECISION MAKING: LOW - limited treatment options, no task modification necessary  REHAB POTENTIAL: Good  EVALUATION COMPLEXITY: Low    PLAN:  OT FREQUENCY: 1x/week, OT DURATION: 8 weeks OT initially recommended 1x/week for 8 weeks at OT eval and pt's caregiver agreed. At end of OT eval, pt's caregiver asked about clinic hours and began to express concerns d/t pt's day program taking place from 8 AM to 4 PM. Per caregiver, "it's bad for everyone when [pt] misses her Day Program" d/t pt's familiar routine with Day Program.  Per PT: During PT eval (which took place after OT eval), pt's caregiver instead requested to pursue home health options (see PT eval notes for further details). Therefore, per caregiver request, pt and caregiver to seek home health OT/PT options instead.   PLANNED INTERVENTIONS: 97168 OT Re-evaluation, 97535 self care/ADL training, 03474 therapeutic exercise, 97530 therapeutic activity, 97112 neuromuscular re-education, 97140 manual therapy, 97760 Orthotics management and training, 25956 Splinting (initial encounter), [phone] Subsequent splinting/medication, passive range of motion, functional mobility training, energy conservation, patient/family education, and DME and/or AE instructions  RECOMMENDED OTHER SERVICES: Outpt PT eval completed today. Pt's caregiver requested to pursue home health options for PT/OT.   CONSULTED AND AGREED WITH PLAN OF CARE: Patient and family member/caregiver  PLAN FOR NEXT SESSION:   N/A - per caregiver request, pt and caregiver to seek home health OT/PT options.      For all  possible CPT codes, reference the Planned Interventions line above.     Check all conditions that are expected to impact treatment: {Conditions expected to impact treatment:Cognitive Impairment or Intellectual disability and Presence of Medical Equipment   If treatment provided at initial evaluation, no treatment charged due to lack of authorization.        Wynetta Emery, OT 06/14/2023, 3:12 PM

## 2023-06-14 NOTE — Therapy (Unsigned)
OUTPATIENT PHYSICAL THERAPY NEURO EVALUATION   Patient Name: Diamond Bowman MRN: [id number] DOB:Feb 18, 1966, 58 y.o., female Today's Date: 06/14/2023   PCP: [facility] - between providers but still being seen here REFERRING PROVIDER: Waldon Reining, DO  END OF SESSION:  PT End of Session - 06/14/23 1024     Visit Number 1    Number of Visits 1    Date for PT Re-Evaluation 06/13/22    Authorization Type [employer]    PT Start Time 1020    PT Stop Time 1050    PT Time Calculation (min) 30 min    Equipment Utilized During Treatment Other (comment)   seated evaluation   Activity Tolerance Patient tolerated treatment well    Behavior During Therapy Restless             Past Medical History:  Diagnosis Date   Anemia    Bipolar 2 disorder (HCC) 05/14/2012   Chronic kidney disease    stage 3   Chronic kidney disease, stage 3 (HCC) 01/04/2015   Encephalopathy 01/04/2015   Fall    Heart murmur    stable for yrs-from childhood.   Hypothyroidism 05/14/2012   Intellectual disability 05/14/2012   Mental retardation    Monteggia's fracture 03/31/2019   Osteoporosis    Schizophrenia (HCC)    Seizures (HCC)    last one > 5 yrs   Tardive dyskinesia    Thrombocytopenic disorder (HCC) 05/06/2020   Tinea pedis 11/04/2021   Venous stasis syndrome 11/04/2021   Past Surgical History:  Procedure Laterality Date   I & D EXTREMITY Right 03/13/2019   Procedure: IRRIGATION AND DEBRIDEMENT EXTREMITY;  Surgeon: Ernest Mallick, MD;  Location: [facility];  Service: Orthopedics;  Laterality: Right;   ORIF ANKLE FRACTURE Left 03/04/2023   Procedure: OPEN REDUCTION INTERNAL FIXATION (ORIF) ANKLE FRACTURE;  Surgeon: Myrene Galas, MD;  Location: [facility];  Service: Orthopedics;  Laterality: Left;   ORIF ULNAR FRACTURE Right 03/13/2019   Procedure: OPEN REDUCTION INTERNAL FIXATION (ORIF) ULNAR FRACTURE;  Surgeon: Ernest Mallick, MD;  Location: [facility];  Service: Orthopedics;   Laterality: Right;   RADIAL HEAD ARTHROPLASTY Right 03/13/2019   Procedure: RADIAL HEAD ARTHROPLASTY;  Surgeon: Ernest Mallick, MD;  Location: [facility];  Service: Orthopedics;  Laterality: Right;   Patient Active Problem List   Diagnosis Date Noted   Left trimalleolar fracture, closed, initial encounter 03/03/2023   Community acquired pneumonia    Inclusion cyst 11/11/2021   Hyperkalemia 11/10/2021   Acute maculopapular rash 11/08/2021   Closed nondisplaced fracture of distal phalanx of right index finger 11/05/2021   Atrial fibrillation (HCC) 11/04/2021   Thrombocytopenic disorder (HCC) 05/06/2020   Chronic kidney disease, stage 3 (HCC) 01/04/2015   Schizophrenia (HCC) 12/28/2014   Intellectual disability 05/14/2012   Seizure disorder (HCC) 05/14/2012   Bipolar 2 disorder (HCC) 05/14/2012   Hypothyroidism 05/14/2012   Osteoporosis 05/14/2012    ONSET DATE: 06/08/2023 (referral date)   REFERRING DIAG: S82.852S (ICD-10-CM) - Displaced trimalleolar fracture of left lower leg, sequela M62.81 (ICD-10-CM) - Muscle weakness (generalized) R27.8 (ICD-10-CM) - Other lack of coordination  THERAPY DIAG:  Abnormality of gait and mobility  Rationale for Evaluation and Treatment: Rehabilitation  SUBJECTIVE:  SUBJECTIVE STATEMENT: Subjective provided primarily by therapeutic provider.   Patient had a fall and fracture of her L ankle back in October. Patient was placed in [facility] following hospital stay and came back January 5. Patient used to be very helpful around the house, would wipe down table and make bed. Since returning from rehab patient is not doing anything at home. Caregiver expresses concern that patient gets overwhelmed by appointments outside of the home and tends to increase  behavioral health issues. Reporting they were hoping for home health physical therapy if possible as patient does best in familiar setting. Patient has also started walking with 2WW since injury and won't get rid of it though apparently was cleared by ortho. Family reports that they feel like patient needs strengthening.  Pt accompanied by: self and [name] - caregiver, therapuetic provider  PERTINENT HISTORY: developmental delay, schizophrenia, tardive dyskinesia, history of falls and fractures, thrombocytopenia, seizures, poor historian.  PAIN:  Are you having pain?  Unclear - does not appear to be in any at start of session  PRECAUTIONS: Fall and Other: no precautions for L leg at this time per caregiver  RED FLAGS: None   WEIGHT BEARING RESTRICTIONS: No  FALLS: Has patient fallen in last 6 months? Yes. Per OT eval: "Number of falls 1  (03/03/23 for fall with left leg pain, found to have L ankle trimalleolar fx. Pt underwent ORIF on 10/10.)"   LIVING ENVIRONMENT: Lives with: with live-in caregiver Lives in: House/apartment Stairs: Yes: External: 1 (approx. 4-inches) steps; none Has following equipment at home: Dan Humphreys - 2 wheeled, Wheelchair (manual), and Tour manager   PLOF: supervision assistance for showering. Ind with walking, dressing.   PATIENT GOALS: Per caregiver, "to get away from walker and build strength."   OBJECTIVE:  Note: Objective measures were completed at Evaluation unless otherwise noted.  DIAGNOSTIC FINDINGS:   03/15/2023 X-Ray Left Ankle: IMPRESSION: Unchanged alignment of distal fibular and medial malleolar fractures post ORIF. No hardware complication.  COGNITION: Overall cognitive status: Impaired and caregiver provides primary subjective, patient repsonds to simple one step commands throughout session with occasional clarification    TRANSFERS: Assistive device utilized: Environmental consultant - 2 wheeled  Sit to stand: SBA Stand to sit: SBA Chair to chair:  SBA Floor: Not assessed   GAIT: Gait pattern: step through pattern and trunk flexed Distance walked: various clinic distances Assistive device utilized: Walker - 2 wheeled Level of assistance: SBA Comments: requires cueing for direction and safety intermittent from caregiver   FUNCTIONAL TESTS:  Not performed as had difficulty following commands for functional testing  PATIENT SURVEYS:  Not indicated                                                                                                                               TREATMENT DATE:     Initial Eval only  PATIENT EDUCATION: Education details: examination findings and recommendations moving forward Person educated: Patient  and Caregiver   Education method: Explanation Education comprehension: verbalized understanding  HOME EXERCISE PROGRAM: Not indicated on today's eval as recommending HH for carryover to home environment   GOALS: Not indicated for outpatient setting at this time  ASSESSMENT:  CLINICAL IMPRESSION: Patient is a 58 y.o. female who was seen today for physical therapy evaluation and treatment for gait impairments secondary to ORIF of L ankle in October following a fall. PMH significant for developmental delay, schizophrenia, tardive dyskinesia, history of falls and fractures, thrombocytopenia, seizures, and is a poor historian. Patient represents increased risk for falls and poor safety with AD. Due to patient and caregiver preference and likely improved carryover in familiar home environment, do not advise outpatient PT services at this time but recommend Home Health PT. Caregivers concerns for increased behavioral challenges in outpatient setting with fatigue also make HH most appropriate at this time.   OBJECTIVE IMPAIRMENTS: Abnormal gait, decreased balance, decreased strength, and improper use of AD .   ACTIVITY LIMITATIONS: carrying, lifting, bending, and locomotion level  PARTICIPATION LIMITATIONS:  cleaning and community activity  PERSONAL FACTORS: Behavior pattern and 3+ comorbidities: see above  are also affecting patient's functional outcome.   CLINICAL DECISION MAKING: Evolving/moderate complexity  EVALUATION COMPLEXITY: Moderate  PLAN:  Recommend Home health physical therapy at this time   Carmelia Bake, PT 06/14/2023, 12:01 PM

## 2023-07-15 NOTE — Progress Notes (Unsigned)
[facility] CONSULT NOTE  Patient Care Team: [facility], [place] as PCP - General  Addendum Repeat lab showed lower platelet 49 vs last record. Lower level had occurred in the past. Recommend short term follow up in about 4 weeks with repeat labs and visit.  ASSESSMENT & PLAN 58 y.o.female with history of bipolar disorder, CKD3, thrombocytopenia, seizure on depakote referred to Hematology for thrombocytopenia.  The etiology is not entirely clear at this time but previously has been stable for many years. No clear clinical changes per caregiver. We discussed repeat testing and if no concerning findings then can follow up as needed. If worsening, will continue follow up and further testing if needed.  Patient has been on Depakote for many years. It is possible it may have been contributing though has been stably low for years.  There is no red flag sign on exam today.  Labs as below. Follow up in about 6 weeks.   Orders Placed This Encounter  Procedures   CBC with Differential (Cancer Center Only)    Standing Status:   Future    Number of Occurrences:   1    Expiration Date:   07/15/2024   CMP (Cancer Center only)    Standing Status:   Future    Number of Occurrences:   1    Expiration Date:   07/15/2024   Lactate dehydrogenase    Standing Status:   Future    Number of Occurrences:   1    Expiration Date:   07/15/2024   Vitamin B12    Standing Status:   Future    Number of Occurrences:   1    Expiration Date:   07/15/2024   Folate    Standing Status:   Future    Number of Occurrences:   1    Expiration Date:   07/15/2024   Ferritin    Standing Status:   Future    Number of Occurrences:   1    Expiration Date:   07/15/2024   Protime-INR    Standing Status:   Future    Number of Occurrences:   1    Expiration Date:   07/15/2024   Melven Sartorius, MD 07/16/2023 9:55 PM  CHIEF COMPLAINTS/PURPOSE OF CONSULTATION:  thrombocytopenia  HISTORY OF PRESENTING  ILLNESS:  Diamond Bowman 58 y.o. female is here because of thrombocytopenia. She is with [facility] Care provider for 20 years. At baseline can answer some questions, required 27/care. Most history from [place].  Records from [facility] health showed chronic moderate thromboctyopenia dated back to 2009 as below. There has been also chronic anemia with macrocytosis as well during this time frame.  Report no change in her appetite. She lost weight while in rehe[place] d/t broken ankle and regained weight and eating fine now. She didn't like the food. No spontaneous, vaginal bleeding, hematuria, bloody stool, melena, bloody nose. Report she always bruise easily. She has take aspirin 81 mg daily after afib about a year ago.   There has been no new medication in the last years. She has been on Depakote for years. No change.  No recently fever, infection, antibiotics, alcohol use.        MEDICAL HISTORY:  Past Medical History:  Diagnosis Date   Anemia    Bipolar 2 disorder (HCC) 05/14/2012   Chronic kidney disease    stage 3   Chronic kidney disease, stage 3 (HCC) 01/04/2015   Encephalopathy 01/04/2015  Fall    Heart murmur    stable for yrs-from childhood.   Hypothyroidism 05/14/2012   Intellectual disability 05/14/2012   Mental retardation    Monteggia's fracture 03/31/2019   Osteoporosis    Schizophrenia (HCC)    Seizures (HCC)    last one > 5 yrs   Tardive dyskinesia    Thrombocytopenic disorder (HCC) 05/06/2020   Tinea pedis 11/04/2021   Venous stasis syndrome 11/04/2021    SURGICAL HISTORY: Past Surgical History:  Procedure Laterality Date   I & D EXTREMITY Right 03/13/2019   Procedure: IRRIGATION AND DEBRIDEMENT EXTREMITY;  Surgeon: Ernest Mallick, MD;  Location: [facility];  Service: Orthopedics;  Laterality: Right;   ORIF ANKLE FRACTURE Left 03/04/2023   Procedure: OPEN REDUCTION INTERNAL FIXATION (ORIF) ANKLE FRACTURE;  Surgeon: Myrene Galas, MD;  Location: [facility];   Service: Orthopedics;  Laterality: Left;   ORIF ULNAR FRACTURE Right 03/13/2019   Procedure: OPEN REDUCTION INTERNAL FIXATION (ORIF) ULNAR FRACTURE;  Surgeon: Ernest Mallick, MD;  Location: [facility];  Service: Orthopedics;  Laterality: Right;   RADIAL HEAD ARTHROPLASTY Right 03/13/2019   Procedure: RADIAL HEAD ARTHROPLASTY;  Surgeon: Ernest Mallick, MD;  [facility]: [facility];  Service: Orthopedics;  Laterality: Right;    SOCIAL HISTORY: Social History   Socioeconomic History   Marital status: Single    Spouse name: Not on file   Number of children: Not on file   Years of education: Not on file   Highest education level: Not on file  Occupational History   Not on file  Tobacco Use   Smoking status: Former    Types: Cigarettes   Smokeless tobacco: Never  Vaping Use   Vaping status: Never Used  Substance and Sexual Activity   Alcohol use: No   Drug use: No   Sexual activity: Not on file  Other Topics Concern   Not on file  Social History Narrative   Patient has been living at her current arrangements with Mrs. Hillary Bow at telephone number [phone] Mother can be reached at [phone] is pretty much total care-she is incontinent of urine and stool.   Social Drivers of Corporate investment banker Strain: Not on file  Food Insecurity: Patient Unable To Answer (03/03/2023)   Hunger Vital Sign    Worried About Running Out of Food in the Last Year: Patient unable to answer    Ran Out of Food in the Last Year: Patient unable to answer  Transportation Needs: Patient Unable To Answer (03/03/2023)   PRAPARE - Administrator, Civil Service (Medical): Patient unable to answer    Lack of Transportation (Non-Medical): Patient unable to answer  Physical Activity: Not on file  Stress: Not on file  Social Connections: Not on file  Intimate Partner Violence: Patient Unable To Answer (03/03/2023)   Humiliation, Afraid, Rape, and Kick questionnaire    Fear of Current or  Ex-Partner: Patient unable to answer    Emotionally Abused: Patient unable to answer    Physically Abused: Patient unable to answer    Sexually Abused: Patient unable to answer    FAMILY HISTORY: Family History  Problem Relation Age of Onset   Osteoarthritis Mother    Colon polyps Mother 76   Diabetes Father    Colon cancer Neg Hx    Esophageal cancer Neg Hx    Rectal cancer Neg Hx    Stomach cancer Neg Hx     ALLERGIES:  is allergic  to amiodarone, rivaroxaban, and verapamil.  MEDICATIONS:  Current Outpatient Medications  Medication Sig Dispense Refill   alendronate (FOSAMAX) 70 MG tablet Take 70 mg by mouth every Thursday. Take with a full glass of water on an empty stomach.     cyproheptadine (PERIACTIN) 4 MG tablet Take 4 mg by mouth at bedtime.     divalproex (DEPAKOTE ER) 500 MG 24 hr tablet Take 3 tablets (1,500 mg total) by mouth at bedtime.     docusate sodium (COLACE) 100 MG capsule Take 100 mg by mouth daily.     Emollient (EUCERIN) lotion Apply 1 Application topically See admin instructions. Apply topically to entire body twice daily     Ferrous Sulfate 140 (45 Fe) MG TBCR Take 1 tablet by mouth daily.     furosemide (LASIX) 20 MG tablet Take 1 tablet (20 mg total) by mouth daily. (Patient taking differently: Take 20 mg by mouth as needed for edema.) 30 tablet 0   GNP ASPIRIN 81 MG tablet TAKE ONE TABLET BY MOUTH DAILY. swallow whole (Patient taking differently: Take 81 mg by mouth daily.) 90 tablet 0   GNP CALCIUM 500 +D3 500-15 MG-MCG TABS Take 1 tablet by mouth daily.     hydrOXYzine (VISTARIL) 50 MG capsule Take 50 mg by mouth 2 (two) times daily. Take 1 tablet in the morning and take 1 tablet at 2pm     KRILL OIL PO Take 100 mg by mouth daily.     levothyroxine (SYNTHROID) 75 MCG tablet Take 75 mcg by mouth daily.     loratadine (CLARITIN) 10 MG tablet Take 10 mg by mouth daily.     metoprolol succinate (TOPROL-XL) 50 MG 24 hr tablet TAKE ONE TABLET BY MOUTH DAILY  take with or immediately following a meal (Patient taking differently: Take 50 mg by mouth daily.) 90 tablet 0   mirtazapine (REMERON) 15 MG tablet Take 15 mg by mouth at bedtime.     Multiple Vitamin (MULTIVITAMIN WITH MINERALS) TABS tablet Take 1 tablet by mouth daily.     olanzapine zydis (ZYPREXA) 15 MG disintegrating tablet Take 1 tablet (15 mg total) by mouth at bedtime.     traZODone (DESYREL) 100 MG tablet Take 300 mg by mouth at bedtime.      No current facility-administered medications for this visit.    REVIEW OF SYSTEMS:   All relevant systems were reviewed with the patient and are negative.  PHYSICAL EXAMINATION:  Vitals:   07/16/23 1431  BP: (!) 134/54  Pulse: 62  Resp: 16  Temp: 97.9 F (36.6 C)  SpO2: 100%   Filed Weights   07/16/23 1431  Weight: 100 lb 12.8 oz (45.7 kg)    GENERAL: alert, no distress and comfortable SKIN: skin color normal and no bruising or petechiae on exposed skin EYES: normal, sclera clear NECK: No palpable mass LYMPH:  no palpable cervical lymphadenopathy  LUNGS: clear to auscultation and percussion with normal breathing effort HEART: regular rate & rhythm and no murmurs  ABDOMEN: abdomen soft, non-tender and nondistended. Musculoskeletal: no edema  LABORATORY DATA:  I have reviewed the data as listed  RADIOGRAPHIC STUDIES: I have personally reviewed the radiological images as listed and agreed with the findings in the report. No results found.

## 2023-07-16 ENCOUNTER — Inpatient Hospital Stay: Payer: Medicaid Other

## 2023-07-16 VITALS — BP 134/54 | HR 62 | Temp 97.9°F | Resp 16 | Ht <= 58 in | Wt 100.8 lb

## 2023-07-16 DIAGNOSIS — D649 Anemia, unspecified: Secondary | ICD-10-CM

## 2023-07-16 DIAGNOSIS — D539 Nutritional anemia, unspecified: Secondary | ICD-10-CM

## 2023-07-16 DIAGNOSIS — Z87891 Personal history of nicotine dependence: Secondary | ICD-10-CM | POA: Diagnosis not present

## 2023-07-16 DIAGNOSIS — Z7982 Long term (current) use of aspirin: Secondary | ICD-10-CM | POA: Insufficient documentation

## 2023-07-16 DIAGNOSIS — D696 Thrombocytopenia, unspecified: Secondary | ICD-10-CM

## 2023-07-16 DIAGNOSIS — Z79899 Other long term (current) drug therapy: Secondary | ICD-10-CM | POA: Insufficient documentation

## 2023-07-16 LAB — CBC WITH DIFFERENTIAL (CANCER CENTER ONLY)
Abs Immature Granulocytes: 0.01 10*3/uL (ref 0.00–0.07)
Basophils Absolute: 0 10*3/uL (ref 0.0–0.1)
Basophils Relative: 0 %
Eosinophils Absolute: 0.1 10*3/uL (ref 0.0–0.5)
Eosinophils Relative: 2 %
HCT: 37.8 % (ref 36.0–46.0)
Hemoglobin: 11.9 g/dL — ABNORMAL LOW (ref 12.0–15.0)
Immature Granulocytes: 0 %
Lymphocytes Relative: 55 %
Lymphs Abs: 2.9 10*3/uL (ref 0.7–4.0)
MCH: 30.5 pg (ref 26.0–34.0)
MCHC: 31.5 g/dL (ref 30.0–36.0)
MCV: 96.9 fL (ref 80.0–100.0)
Monocytes Absolute: 0.6 10*3/uL (ref 0.1–1.0)
Monocytes Relative: 11 %
Neutro Abs: 1.7 10*3/uL (ref 1.7–7.7)
Neutrophils Relative %: 32 %
Platelet Count: 49 10*3/uL — ABNORMAL LOW (ref 150–400)
RBC: 3.9 MIL/uL (ref 3.87–5.11)
RDW: 18.1 % — ABNORMAL HIGH (ref 11.5–15.5)
WBC Count: 5.4 10*3/uL (ref 4.0–10.5)
nRBC: 0 % (ref 0.0–0.2)

## 2023-07-16 LAB — CMP (CANCER CENTER ONLY)
ALT: 7 U/L (ref 0–44)
AST: 17 U/L (ref 15–41)
Albumin: 3.6 g/dL (ref 3.5–5.0)
Alkaline Phosphatase: 52 U/L (ref 38–126)
Anion gap: 2 — ABNORMAL LOW (ref 5–15)
BUN: 29 mg/dL — ABNORMAL HIGH (ref 6–20)
CO2: 37 mmol/L — ABNORMAL HIGH (ref 22–32)
Calcium: 9 mg/dL (ref 8.9–10.3)
Chloride: 99 mmol/L (ref 98–111)
Creatinine: 1.16 mg/dL — ABNORMAL HIGH (ref 0.44–1.00)
GFR, Estimated: 54 mL/min — ABNORMAL LOW (ref >60)
Glucose, Bld: 81 mg/dL (ref 70–99)
Potassium: 5 mmol/L (ref 3.5–5.1)
Sodium: 138 mmol/L (ref 135–145)
Total Bilirubin: 0.2 mg/dL (ref 0.0–1.2)
Total Protein: 6.5 g/dL (ref 6.5–8.1)

## 2023-07-16 LAB — FOLATE: Folate: 29.6 ng/mL (ref >5.9)

## 2023-07-16 LAB — FERRITIN: Ferritin: 192 ng/mL (ref 11–307)

## 2023-07-16 LAB — PROTIME-INR
INR: 1 (ref 0.8–1.2)
Prothrombin Time: 12.9 s (ref 11.4–15.2)

## 2023-07-16 LAB — VITAMIN B12: Vitamin B-12: 611 pg/mL (ref 180–914)

## 2023-07-16 LAB — LACTATE DEHYDROGENASE: LDH: 197 U/L — ABNORMAL HIGH (ref 98–192)

## 2023-07-19 ENCOUNTER — Telehealth: Payer: Self-pay | Admitting: *Deleted

## 2023-07-19 ENCOUNTER — Telehealth: Payer: Self-pay

## 2023-07-19 NOTE — Telephone Encounter (Signed)
-----   Message from Melven Sartorius sent at 07/19/2023 10:24 AM EST ----- Diamond Bowman please let caregiver know the platelet is slightly lower than before. I would like to follow up in about 4-5 week with lab before visit to check for stability. Orders are in. Thanks.

## 2023-07-19 NOTE — Telephone Encounter (Signed)
 Notified of message below

## 2023-07-19 NOTE — Telephone Encounter (Signed)
 Diamond Bowman

## 2023-08-11 NOTE — Assessment & Plan Note (Deleted)
 Repeat labs showed

## 2023-08-11 NOTE — Progress Notes (Deleted)
 [facility] OFFICE PROGRESS NOTE  Patient Care Team: [facility], [place] as PCP - General  58 y.o.female with history of bipolar disorder, CKD3, thrombocytopenia, seizure on depakote referred to Hematology for thrombocytopenia.   The etiology is not entirely clear at this time but previously has been stable for many years. No clear clinical changes per caregiver. We discussed repeat testing and if no concerning findings then can follow up as needed. If worsening, will continue follow up and further testing if needed.  Patient has been on Depakote for many years. It is possible it may have been contributing though has been stably low for years. Repeat labs in feb showed platelets of 49. Assessment & Plan Thrombocytopenia (HCC) Repeat labs showed   No orders of the defined types were placed in this encounter.    Melven Sartorius, MD  INTERVAL HISTORY: she returns for treatment follow-up due to recently found lower platelet level with chronic thrombocytopenia.    PHYSICAL EXAMINATION:  There were no vitals filed for this visit.  Relevant data reviewed during this visit included ***

## 2023-08-12 ENCOUNTER — Telehealth: Payer: Self-pay

## 2023-08-12 NOTE — Telephone Encounter (Signed)
 Completed - Patient was able to re schedule appt per provider's request.

## 2023-08-13 ENCOUNTER — Inpatient Hospital Stay

## 2023-08-13 ENCOUNTER — Other Ambulatory Visit: Payer: Medicaid Other

## 2023-08-13 ENCOUNTER — Ambulatory Visit: Payer: Medicaid Other

## 2023-08-13 ENCOUNTER — Inpatient Hospital Stay (HOSPITAL_BASED_OUTPATIENT_CLINIC_OR_DEPARTMENT_OTHER)

## 2023-08-13 VITALS — BP 150/56 | HR 62 | Temp 97.2°F | Resp 16 | Wt 99.7 lb

## 2023-08-13 DIAGNOSIS — D696 Thrombocytopenia, unspecified: Secondary | ICD-10-CM

## 2023-08-13 DIAGNOSIS — D649 Anemia, unspecified: Secondary | ICD-10-CM | POA: Insufficient documentation

## 2023-08-13 LAB — LACTATE DEHYDROGENASE: LDH: 196 U/L — ABNORMAL HIGH (ref 98–192)

## 2023-08-13 LAB — CBC WITH DIFFERENTIAL (CANCER CENTER ONLY)
Abs Immature Granulocytes: 0.02 10*3/uL (ref 0.00–0.07)
Basophils Absolute: 0 10*3/uL (ref 0.0–0.1)
Basophils Relative: 1 %
Eosinophils Absolute: 0.1 10*3/uL (ref 0.0–0.5)
Eosinophils Relative: 2 %
HCT: 35.7 % — ABNORMAL LOW (ref 36.0–46.0)
Hemoglobin: 11.7 g/dL — ABNORMAL LOW (ref 12.0–15.0)
Immature Granulocytes: 0 %
Lymphocytes Relative: 62 %
Lymphs Abs: 3 10*3/uL (ref 0.7–4.0)
MCH: 32.3 pg (ref 26.0–34.0)
MCHC: 32.8 g/dL (ref 30.0–36.0)
MCV: 98.6 fL (ref 80.0–100.0)
Monocytes Absolute: 0.5 10*3/uL (ref 0.1–1.0)
Monocytes Relative: 10 %
Neutro Abs: 1.2 10*3/uL — ABNORMAL LOW (ref 1.7–7.7)
Neutrophils Relative %: 25 %
Platelet Count: 36 10*3/uL — ABNORMAL LOW (ref 150–400)
RBC: 3.62 MIL/uL — ABNORMAL LOW (ref 3.87–5.11)
RDW: 17.3 % — ABNORMAL HIGH (ref 11.5–15.5)
WBC Count: 4.8 10*3/uL (ref 4.0–10.5)
nRBC: 0 % (ref 0.0–0.2)

## 2023-08-13 NOTE — Progress Notes (Signed)
 [facility] OFFICE PROGRESS NOTE  Patient Care Team: [facility], [place] as PCP - General  58 y.o.female with history of bipolar disorder, CKD3, thrombocytopenia, seizure on depakote  referred to Hematology for thrombocytopenia.   The etiology is not entirely clear at this time but previously has been stable for many years. No clear clinical changes per caregiver. We discussed repeat testing and if no concerning findings then can follow up as needed. If worsening, will continue follow up and further testing if needed.  Patient has been on Depakote  for many years. It is possible it may have been contributing though has been stably low for years. Repeat labs in feb showed platelets of 49. Assessment & Plan Thrombocytopenia (HCC) Labs today Short term follow up to monitor platelet and CBC.     Lowanda Ruddy, MD  INTERVAL HISTORY: She is with [facility]ter Care provider for 20 years. At baseline can answer some questions, required 24/7care. Most history from [place].  Repeat labs showed worsening thrombocytopenia with platelet of 36 today. No bleeding, bruising, bloody urine or stool, nosebleeding. No change.   She scratches herself until bleeds.  Records from [facility] health showed chronic moderate thromboctyopenia dated back to 2009 as below. There has been also chronic anemia with macrocytosis as well during this time frame.    There has been no new medication in the last years. She has been on Depakote  for years. No change.      PHYSICAL EXAMINATION:  VSS  GENERAL: alert, no distress and comfortable SKIN: skin color normal and no bruising or petechiae or jaundice on exposed skin EYES: normal, sclera clear LYMPH:  no palpable cervical lymphadenopathy  LUNGS: clear to auscultation and percussion with normal breathing effort ABDOMEN: abdomen soft, non-tender and nondistended.   Relevant data reviewed during this visit included previous labs.

## 2023-08-16 ENCOUNTER — Other Ambulatory Visit: Payer: Self-pay

## 2023-08-16 DIAGNOSIS — D696 Thrombocytopenia, unspecified: Secondary | ICD-10-CM

## 2023-10-07 NOTE — Progress Notes (Unsigned)
 [facility] OFFICE PROGRESS NOTE  Patient Care Team: [facility], [place] as PCP - General  Assessment & Plan   No orders of the defined types were placed in this encounter.    Lowanda Ruddy, MD  INTERVAL HISTORY: Patient returns for follow-up. Last labs showed worsening thrombocytopenia. She was otherwise without clinical change.  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  PHYSICAL EXAMINATION: ECOG PERFORMANCE STATUS: {CHL ONC ECOG PS:[id number]}  There were no vitals filed for this visit. There were no vitals filed for this visit.  GENERAL: alert, no distress and comfortable SKIN: skin color normal and no bruising or petechiae or jaundice on exposed skin EYES: normal, sclera clear OROPHARYNX: no exudate  NECK: No palpable mass LYMPH:  no palpable cervical, axillary lymphadenopathy  LUNGS: clear to auscultation and percussion with normal breathing effort HEART: regular rate & rhythm  ABDOMEN: abdomen soft, non-tender and nondistended. Musculoskeletal: no edema NEURO: no focal motor/sensory deficits  Relevant data reviewed during this visit included ***

## 2023-10-08 ENCOUNTER — Inpatient Hospital Stay (HOSPITAL_BASED_OUTPATIENT_CLINIC_OR_DEPARTMENT_OTHER)

## 2023-10-08 ENCOUNTER — Inpatient Hospital Stay

## 2023-10-08 VITALS — BP 122/68 | HR 96 | Temp 97.3°F | Resp 16 | Wt 100.1 lb

## 2023-10-08 DIAGNOSIS — D696 Thrombocytopenia, unspecified: Secondary | ICD-10-CM

## 2023-10-08 DIAGNOSIS — D649 Anemia, unspecified: Secondary | ICD-10-CM | POA: Diagnosis present

## 2023-10-08 LAB — CBC WITH DIFFERENTIAL (CANCER CENTER ONLY)
Abs Immature Granulocytes: 0.02 10*3/uL (ref 0.00–0.07)
Basophils Absolute: 0 10*3/uL (ref 0.0–0.1)
Basophils Relative: 1 %
Eosinophils Absolute: 0 10*3/uL (ref 0.0–0.5)
Eosinophils Relative: 1 %
HCT: 36.3 % (ref 36.0–46.0)
Hemoglobin: 12.1 g/dL (ref 12.0–15.0)
Immature Granulocytes: 0 %
Lymphocytes Relative: 47 %
Lymphs Abs: 2.8 10*3/uL (ref 0.7–4.0)
MCH: 33.3 pg (ref 26.0–34.0)
MCHC: 33.3 g/dL (ref 30.0–36.0)
MCV: 100 fL (ref 80.0–100.0)
Monocytes Absolute: 0.7 10*3/uL (ref 0.1–1.0)
Monocytes Relative: 12 %
Neutro Abs: 2.3 10*3/uL (ref 1.7–7.7)
Neutrophils Relative %: 39 %
Platelet Count: 49 10*3/uL — ABNORMAL LOW (ref 150–400)
RBC: 3.63 MIL/uL — ABNORMAL LOW (ref 3.87–5.11)
RDW: 15.1 % (ref 11.5–15.5)
WBC Count: 5.8 10*3/uL (ref 4.0–10.5)
nRBC: 0 % (ref 0.0–0.2)

## 2023-10-08 LAB — CMP (CANCER CENTER ONLY)
ALT: 12 U/L (ref 0–44)
AST: 24 U/L (ref 15–41)
Albumin: 3.4 g/dL — ABNORMAL LOW (ref 3.5–5.0)
Alkaline Phosphatase: 52 U/L (ref 38–126)
Anion gap: 7 (ref 5–15)
BUN: 31 mg/dL — ABNORMAL HIGH (ref 6–20)
CO2: 36 mmol/L — ABNORMAL HIGH (ref 22–32)
Calcium: 8.9 mg/dL (ref 8.9–10.3)
Chloride: 96 mmol/L — ABNORMAL LOW (ref 98–111)
Creatinine: 1.19 mg/dL — ABNORMAL HIGH (ref 0.44–1.00)
GFR, Estimated: 52 mL/min — ABNORMAL LOW (ref >60)
Glucose, Bld: 84 mg/dL (ref 70–99)
Potassium: 4.7 mmol/L (ref 3.5–5.1)
Sodium: 139 mmol/L (ref 135–145)
Total Bilirubin: 0.2 mg/dL (ref 0.0–1.2)
Total Protein: 7 g/dL (ref 6.5–8.1)

## 2023-10-08 LAB — SAMPLE TO BLOOD BANK

## 2023-10-08 LAB — LACTATE DEHYDROGENASE: LDH: 209 U/L — ABNORMAL HIGH (ref 98–192)

## 2023-10-08 NOTE — Assessment & Plan Note (Addendum)
 CBC, CMP and LDH in six weeks.

## 2023-10-26 NOTE — Assessment & Plan Note (Addendum)
 Labs today Short term follow up to monitor platelet and CBC.

## 2023-11-18 NOTE — Progress Notes (Deleted)
 [facility] OFFICE PROGRESS NOTE  Patient Care Team: [facility], [place] as PCP - General  58 y.o.female with history of bipolar disorder, CKD3, thrombocytopenia, seizure on depakote  referred to Hematology for thrombocytopenia.   The etiology is not entirely clear at this time but previously has been stable for many years. Now worsening. No clear clinical changes per caregiver. No warning signs. We discussed continue follow up.  Patient has been on Depakote  for many years but no dose change.    Repeat labs in feb showed platelets of 49. Will continue monitor. Assessment & Plan   No orders of the defined types were placed in this encounter.    Diamond Diamond Chihuahua, MD  INTERVAL HISTORY: Patient returns for follow-up.  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    PHYSICAL EXAMINATION: ECOG PERFORMANCE STATUS: {CHL ONC ECOG PS:[id number]}  There were no vitals filed for this visit. There were no vitals filed for this visit.  Relevant data reviewed during this visit included ***

## 2023-11-19 ENCOUNTER — Inpatient Hospital Stay: Payer: MEDICAID

## 2023-12-16 NOTE — Progress Notes (Unsigned)
 [facility] OFFICE PROGRESS NOTE  Patient Care Team: [facility], [place] as PCP - General  58 y.o.female with history of bipolar disorder, CKD3, thrombocytopenia, seizure on depakote  referred to Hematology for thrombocytopenia.   The etiology is not entirely clear at this time but previously has been stable for many years. Now worsening. Now new new clinical changes of weight loss, decrease energy per caregiver.  We discussed bone marrow biopsy with new change and then follow up.  Patient has been on Depakote  for many years but no dose change.    Repeat labs in feb showed platelets of 49. Will continue monitor.  She is able to participate group activities. She has intellectual disability and requiring assistance in medical decision.  Follow up with me after biopsy on 8/22 at 3PM. Assessment & Plan Thrombocytopenia (HCC) Worsening with weight loss Will repeat BM biopsy Weight loss Will proceed with bone marrow biopsy with worsening thrombocytopenia  Orders Placed This Encounter  Procedures   CT BONE MARROW BIOPSY & ASPIRATION    Standing Status:   Future    Expected Date:   01/06/2024    Expiration Date:   12/16/2024    Reason for Exam (SYMPTOM  OR DIAGNOSIS REQUIRED):   worsening thrombocytopenia and weight loss.    Is patient pregnant?:   No    Preferred imaging location?:   [facility]    Radiology Contrast Protocol - do NOT remove file path:   [email or web address]     Pauletta JAYSON Chihuahua, MD  INTERVAL HISTORY: Patient returns for follow-up. Report of losing weight. Report not wanting to eat breakfast and lunch a few times. Caregiver reports she gets tired sometimes in the evening.   No mass, bleeding, stomach pain.  Report some scratching.  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    PHYSICAL EXAMINATION: ECOG PERFORMANCE STATUS: 1 - Symptomatic  but completely ambulatory  Vitals:   12/17/23 1410  BP: (!) 151/53  Pulse: 71  Resp: 18  Temp: (!) 97.2 F (36.2 C)  SpO2: 92%   Filed Weights   12/17/23 1410  Weight: 88 lb 6.4 oz (40.1 kg)   GENERAL: alert, no distress and comfortable SKIN: skin color normal and no bruising or jaundice on exposed skin NECK: No palpable mass LYMPH:  no palpable cervical, axillary lymphadenopathy  LUNGS: clear to auscultation and percussion with normal breathing effort HEART: regular rate & rhythm  ABDOMEN: abdomen soft, non-tender and nondistended. Musculoskeletal: no edema  Relevant data reviewed during this visit included labs.

## 2023-12-17 ENCOUNTER — Inpatient Hospital Stay: Payer: MEDICAID

## 2023-12-17 VITALS — BP 151/53 | HR 71 | Temp 97.2°F | Resp 18 | Wt 88.4 lb

## 2023-12-17 DIAGNOSIS — D649 Anemia, unspecified: Secondary | ICD-10-CM | POA: Diagnosis present

## 2023-12-17 DIAGNOSIS — R634 Abnormal weight loss: Secondary | ICD-10-CM | POA: Diagnosis not present

## 2023-12-17 DIAGNOSIS — D696 Thrombocytopenia, unspecified: Secondary | ICD-10-CM | POA: Insufficient documentation

## 2023-12-17 LAB — CMP (CANCER CENTER ONLY)
ALT: 8 U/L (ref 0–44)
AST: 19 U/L (ref 15–41)
Albumin: 3.1 g/dL — ABNORMAL LOW (ref 3.5–5.0)
Alkaline Phosphatase: 59 U/L (ref 38–126)
Anion gap: 2 — ABNORMAL LOW (ref 5–15)
BUN: 26 mg/dL — ABNORMAL HIGH (ref 6–20)
CO2: 37 mmol/L — ABNORMAL HIGH (ref 22–32)
Calcium: 9.1 mg/dL (ref 8.9–10.3)
Chloride: 101 mmol/L (ref 98–111)
Creatinine: 1.16 mg/dL — ABNORMAL HIGH (ref 0.44–1.00)
GFR, Estimated: 54 mL/min — ABNORMAL LOW (ref >60)
Glucose, Bld: 71 mg/dL (ref 70–99)
Potassium: 5.3 mmol/L — ABNORMAL HIGH (ref 3.5–5.1)
Sodium: 140 mmol/L (ref 135–145)
Total Bilirubin: 0.4 mg/dL (ref 0.0–1.2)
Total Protein: 5.9 g/dL — ABNORMAL LOW (ref 6.5–8.1)

## 2023-12-17 LAB — CBC WITH DIFFERENTIAL (CANCER CENTER ONLY)
Abs Immature Granulocytes: 0.02 K/uL (ref 0.00–0.07)
Basophils Absolute: 0 K/uL (ref 0.0–0.1)
Basophils Relative: 1 %
Eosinophils Absolute: 0.2 K/uL (ref 0.0–0.5)
Eosinophils Relative: 3 %
HCT: 37.7 % (ref 36.0–46.0)
Hemoglobin: 12.1 g/dL (ref 12.0–15.0)
Immature Granulocytes: 0 %
Lymphocytes Relative: 48 %
Lymphs Abs: 2.5 K/uL (ref 0.7–4.0)
MCH: 33.4 pg (ref 26.0–34.0)
MCHC: 32.1 g/dL (ref 30.0–36.0)
MCV: 104.1 fL — ABNORMAL HIGH (ref 80.0–100.0)
Monocytes Absolute: 0.5 K/uL (ref 0.1–1.0)
Monocytes Relative: 10 %
Neutro Abs: 2 K/uL (ref 1.7–7.7)
Neutrophils Relative %: 38 %
Platelet Count: 45 K/uL — ABNORMAL LOW (ref 150–400)
RBC: 3.62 MIL/uL — ABNORMAL LOW (ref 3.87–5.11)
RDW: 15.9 % — ABNORMAL HIGH (ref 11.5–15.5)
WBC Count: 5.2 K/uL (ref 4.0–10.5)
nRBC: 0 % (ref 0.0–0.2)

## 2023-12-17 LAB — LACTATE DEHYDROGENASE: LDH: 210 U/L — ABNORMAL HIGH (ref 98–192)

## 2023-12-17 NOTE — Assessment & Plan Note (Signed)
 Worsening with weight loss Will repeat BM biopsy

## 2024-01-06 ENCOUNTER — Encounter (HOSPITAL_COMMUNITY): Payer: Self-pay

## 2024-01-06 ENCOUNTER — Ambulatory Visit (HOSPITAL_COMMUNITY)
Admission: RE | Admit: 2024-01-06 | Discharge: 2024-01-06 | Disposition: A | Discharge status: Home / Self Care | Payer: MEDICAID | Source: Ambulatory Visit

## 2024-01-06 VITALS — BP 141/63 | HR 75 | Temp 97.4°F | Resp 18 | Ht <= 58 in | Wt 88.5 lb

## 2024-01-06 DIAGNOSIS — N183 Chronic kidney disease, stage 3 unspecified: Secondary | ICD-10-CM | POA: Insufficient documentation

## 2024-01-06 DIAGNOSIS — R569 Unspecified convulsions: Secondary | ICD-10-CM | POA: Diagnosis not present

## 2024-01-06 DIAGNOSIS — Z6821 Body mass index (BMI) 21.0-21.9, adult: Secondary | ICD-10-CM | POA: Diagnosis not present

## 2024-01-06 DIAGNOSIS — F79 Unspecified intellectual disabilities: Secondary | ICD-10-CM | POA: Diagnosis not present

## 2024-01-06 DIAGNOSIS — D631 Anemia in chronic kidney disease: Secondary | ICD-10-CM | POA: Diagnosis not present

## 2024-01-06 DIAGNOSIS — D696 Thrombocytopenia, unspecified: Secondary | ICD-10-CM | POA: Insufficient documentation

## 2024-01-06 DIAGNOSIS — F3181 Bipolar II disorder: Secondary | ICD-10-CM | POA: Insufficient documentation

## 2024-01-06 DIAGNOSIS — R634 Abnormal weight loss: Secondary | ICD-10-CM | POA: Insufficient documentation

## 2024-01-06 LAB — CBC WITH DIFFERENTIAL/PLATELET
Abs Immature Granulocytes: 0.02 K/uL (ref 0.00–0.07)
Basophils Absolute: 0 K/uL (ref 0.0–0.1)
Basophils Relative: 1 %
Eosinophils Absolute: 0.6 K/uL — ABNORMAL HIGH (ref 0.0–0.5)
Eosinophils Relative: 7 %
HCT: 37.3 % (ref 36.0–46.0)
Hemoglobin: 11.4 g/dL — ABNORMAL LOW (ref 12.0–15.0)
Immature Granulocytes: 0 %
Lymphocytes Relative: 40 %
Lymphs Abs: 3 K/uL (ref 0.7–4.0)
MCH: 32.5 pg (ref 26.0–34.0)
MCHC: 30.6 g/dL (ref 30.0–36.0)
MCV: 106.3 fL — ABNORMAL HIGH (ref 80.0–100.0)
Monocytes Absolute: 0.7 K/uL (ref 0.1–1.0)
Monocytes Relative: 9 %
Neutro Abs: 3.2 K/uL (ref 1.7–7.7)
Neutrophils Relative %: 43 %
Platelets: 57 K/uL — ABNORMAL LOW (ref 150–400)
RBC: 3.51 MIL/uL — ABNORMAL LOW (ref 3.87–5.11)
RDW: 15.1 % (ref 11.5–15.5)
Smear Review: NORMAL
WBC: 7.5 K/uL (ref 4.0–10.5)
nRBC: 0 % (ref 0.0–0.2)

## 2024-01-06 MED ORDER — FENTANYL CITRATE (PF) 100 MCG/2ML IJ SOLN
INTRAMUSCULAR | Status: AC | PRN
  Administered 2024-01-06 (×2): 50 ug via INTRAVENOUS

## 2024-01-06 MED ORDER — MIDAZOLAM HCL 2 MG/2ML IJ SOLN
INTRAMUSCULAR | Status: AC | PRN
  Administered 2024-01-06 (×2): 1 mg via INTRAVENOUS

## 2024-01-06 MED ORDER — MIDAZOLAM HCL 2 MG/2ML IJ SOLN
INTRAMUSCULAR | Status: AC
  Filled 2024-01-06: qty 2

## 2024-01-06 MED ORDER — FLUMAZENIL 0.5 MG/5ML IV SOLN
INTRAVENOUS | Status: AC
  Filled 2024-01-06: qty 5

## 2024-01-06 MED ORDER — FENTANYL CITRATE (PF) 100 MCG/2ML IJ SOLN
INTRAMUSCULAR | Status: AC
  Filled 2024-01-06: qty 2

## 2024-01-06 MED ORDER — SODIUM CHLORIDE 0.9 % IV SOLN: INTRAVENOUS | Status: DC

## 2024-01-06 MED ORDER — SODIUM CHLORIDE 0.9 % IV SOLN
INTRAVENOUS | Status: AC
  Filled 2024-01-06: qty 500

## 2024-01-06 MED ORDER — NALOXONE HCL 0.4 MG/ML IJ SOLN
INTRAMUSCULAR | Status: AC
  Filled 2024-01-06: qty 1

## 2024-01-06 NOTE — H&P (Signed)
 Chief Complaint: Patient was seen in consultation today for thrombocytopenia  Referring Physician(s): Chang,Rubens C  Supervising Physician: Luverne Aran  Patient Status: [facility] - Out-pt  History of Present Illness: Diamond Bowman is a 58 y.o. female with history of intellectual disability, bipolar disorder, CKD 3, thrombocytopenia, seizures who is followed by Dr. Tina for her abnormal blood work.  Patient referred to [facility] for bone marrow biopsy.   She is accompanied by mother and caregiver today.  She has been NPO.  They are aware of the goals of the procedure and are agreeable to proceed.   She is a FULL CODE.  Past Medical History:  Diagnosis Date   Anemia    Bipolar 2 disorder (HCC) 05/14/2012   Chronic kidney disease    stage 3   Chronic kidney disease, stage 3 (HCC) 01/04/2015   Encephalopathy 01/04/2015   Fall    Heart murmur    stable for yrs-from childhood.   Hypothyroidism 05/14/2012   Intellectual disability 05/14/2012   Mental retardation    Monteggia's fracture 03/31/2019   Osteoporosis    Schizophrenia (HCC)    Seizures (HCC)    last one > 5 yrs   Tardive dyskinesia    Thrombocytopenic disorder (HCC) 05/06/2020   Tinea pedis 11/04/2021   Venous stasis syndrome 11/04/2021    Past Surgical History:  Procedure Laterality Date   I & D EXTREMITY Right 03/13/2019   Procedure: IRRIGATION AND DEBRIDEMENT EXTREMITY;  Surgeon: Carolee Lynwood JINNY DOUGLAS, MD;  Location: [facility] OR;  Service: Orthopedics;  Laterality: Right;   ORIF ANKLE FRACTURE Left 03/04/2023   Procedure: OPEN REDUCTION INTERNAL FIXATION (ORIF) ANKLE FRACTURE;  Surgeon: Celena Sharper, MD;  Location: [facility] OR;  Service: Orthopedics;  Laterality: Left;   ORIF ULNAR FRACTURE Right 03/13/2019   Procedure: OPEN REDUCTION INTERNAL FIXATION (ORIF) ULNAR FRACTURE;  Surgeon: Carolee Lynwood JINNY DOUGLAS, MD;  Location: [facility] OR;  Service: Orthopedics;  Laterality: Right;   RADIAL HEAD ARTHROPLASTY Right  03/13/2019   Procedure: RADIAL HEAD ARTHROPLASTY;  Surgeon: Carolee Lynwood JINNY DOUGLAS, MD;  Location: [facility] OR;  Service: Orthopedics;  Laterality: Right;    Allergies: Amiodarone , Rivaroxaban , and Verapamil   Medications: Prior to Admission medications   Medication Sig Start Date End Date Taking? Authorizing Provider  alendronate  (FOSAMAX ) 70 MG tablet Take 70 mg by mouth every Thursday. Take with a full glass of water on an empty stomach.   Yes [provider]  cyproheptadine  (PERIACTIN ) 4 MG tablet Take 4 mg by mouth at bedtime.   Yes [provider]  divalproex  (DEPAKOTE  ER) 500 MG 24 hr tablet Take 3 tablets (1,500 mg total) by mouth at bedtime. 05/15/12  Yes Samtani, Jai-Gurmukh, MD  docusate sodium  (COLACE) 100 MG capsule Take 100 mg by mouth daily.   Yes [provider]  Emollient (EUCERIN) lotion Apply 1 Application topically See admin instructions. Apply topically to entire body twice daily   Yes [provider]  Ferrous Sulfate  140 (45 Fe) MG TBCR Take 1 tablet by mouth daily.   Yes [provider]  furosemide  (LASIX ) 20 MG tablet Take 1 tablet (20 mg total) by mouth daily. Patient taking differently: Take 20 mg by mouth as needed for edema. 12/05/21  Yes Tolia, Sunit, DO  GNP ASPIRIN  81 MG tablet TAKE ONE TABLET BY MOUTH DAILY. swallow whole Patient taking differently: Take 81 mg by mouth daily. 02/09/22  Yes Tolia, Sunit, DO  GNP CALCIUM  500 +D3 500-15 MG-MCG TABS Take 1  tablet by mouth daily. 10/03/21  Yes [provider]  hydrOXYzine  (VISTARIL ) 50 MG capsule Take 50 mg by mouth 2 (two) times daily. Take 1 tablet in the morning and take 1 tablet at 2pm 07/29/21  Yes [provider]  KRILL OIL PO Take 100 mg by mouth daily.   Yes [provider]  levothyroxine  (SYNTHROID ) 75 MCG tablet Take 75 mcg by mouth daily. 08/18/21  Yes [provider]  loratadine  (CLARITIN ) 10 MG tablet Take 10 mg by mouth daily.   Yes  [provider]  metoprolol  succinate (TOPROL -XL) 50 MG 24 hr tablet TAKE ONE TABLET BY MOUTH DAILY take with or immediately following a meal Patient taking differently: Take 50 mg by mouth daily. 09/30/22  Yes Tolia, Sunit, DO  mirtazapine  (REMERON ) 15 MG tablet Take 15 mg by mouth at bedtime.   Yes [provider]  Multiple Vitamin (MULTIVITAMIN WITH MINERALS) TABS tablet Take 1 tablet by mouth daily.   Yes [provider]  olanzapine  zydis (ZYPREXA ) 15 MG disintegrating tablet Take 1 tablet (15 mg total) by mouth at bedtime. 01/04/15  Yes Rizwan, Saima, MD  traZODone  (DESYREL ) 100 MG tablet Take 300 mg by mouth at bedtime.    Yes [provider]     Family History  Problem Relation Age of Onset   Osteoarthritis Mother    Colon polyps Mother 63   Diabetes Father    Colon cancer Neg Hx    Esophageal cancer Neg Hx    Rectal cancer Neg Hx    Stomach cancer Neg Hx     Social History   Socioeconomic History   Marital status: Single    Spouse name: Not on file   Number of children: Not on file   Years of education: Not on file   Highest education level: Not on file  Occupational History   Not on file  Tobacco Use   Smoking status: Former    Types: Cigarettes    Passive exposure: Never   Smokeless tobacco: Never  Vaping Use   Vaping status: Never Used  Substance and Sexual Activity   Alcohol  use: No   Drug use: No   Sexual activity: Not on file  Other Topics Concern   Not on file  Social History Narrative   Patient has been living at her current arrangements with Mrs. Riley at telephone number [phone] Mother can be reached at [phone] is pretty much total care-she is incontinent of urine and stool.   Social Drivers of Corporate investment banker Strain: Not on file  Food Insecurity: Patient Unable To Answer (03/03/2023)   Hunger Vital Sign    Worried About Running Out of Food in the Last Year: Patient unable to answer    Ran Out  of Food in the Last Year: Patient unable to answer  Transportation Needs: Patient Unable To Answer (03/03/2023)   PRAPARE - Administrator, Civil Service (Medical): Patient unable to answer    Lack of Transportation (Non-Medical): Patient unable to answer  Physical Activity: Not on file  Stress: Not on file  Social Connections: Not on file     Review of Systems: A 12 point ROS discussed and pertinent positives are indicated in the HPI above.  All other systems are negative.  Review of Systems  Constitutional:  Negative for fatigue and fever.  Respiratory:  Negative for cough and shortness of breath.   Cardiovascular:  Negative for chest pain.  Gastrointestinal:  Negative for abdominal pain, nausea and vomiting.  Musculoskeletal:  Negative for back pain.  Psychiatric/Behavioral:  Negative for behavioral problems and confusion.     Vital Signs: BP (!) 147/76   Pulse 70   Temp 97.9 F (36.6 C) (Oral)   Resp 16   Ht 4' 6 (1.372 m)   Wt 88 lb 8 oz (40.1 kg)   LMP  (LMP Unknown)   SpO2 100%   BMI 21.34 kg/m   Physical Exam Vitals and nursing note reviewed.  Constitutional:      General: She is not in acute distress.    Appearance: Normal appearance. She is not ill-appearing.  HENT:     Mouth/Throat:     Mouth: Mucous membranes are moist.     Pharynx: Oropharynx is clear.  Cardiovascular:     Rate and Rhythm: Normal rate and regular rhythm.  Abdominal:     General: Abdomen is flat. There is no distension.     Palpations: Abdomen is soft.  Skin:    General: Skin is warm and dry.  Neurological:     General: No focal deficit present.     Mental Status: She is alert. Mental status is at baseline.  Psychiatric:        Mood and Affect: Mood normal.      MD Evaluation Airway: WNL Heart: WNL Abdomen: WNL Chest/ Lungs: WNL ASA  Classification: 3 Mallampati/Airway Score: Two   Imaging: No results found.  Labs:  CBC: Recent Labs    07/16/23 1544  08/13/23 1506 10/08/23 1456 12/17/23 1346  WBC 5.4 4.8 5.8 5.2  HGB 11.9* 11.7* 12.1 12.1  HCT 37.8 35.7* 36.3 37.7  PLT 49* 36* 49* 45*    COAGS: Recent Labs    07/16/23 1543  INR 1.0    BMP: Recent Labs    03/09/23 1241 07/16/23 1544 10/08/23 1456 12/17/23 1346  NA 138 138 139 140  K 4.2 5.0 4.7 5.3*  CL 99 99 96* 101  CO2 30 37* 36* 37*  GLUCOSE 123* 81 84 71  BUN 22* 29* 31* 26*  CALCIUM  8.4* 9.0 8.9 9.1  CREATININE 0.95 1.16* 1.19* 1.16*  GFRNONAA >60 54* 52* 54*    LIVER FUNCTION TESTS: Recent Labs    03/03/23 1310 07/16/23 1544 10/08/23 1456 12/17/23 1346  BILITOT 0.7 0.2 0.2 0.4  AST 28 17 24 19   ALT 14 7 12 8   ALKPHOS 39 52 52 59  PROT 5.8* 6.5 7.0 5.9*  ALBUMIN 2.9* 3.6 3.4* 3.1*    TUMOR MARKERS: No results for input(s): AFPTM, CEA, CA199, CHROMGRNA in the last 8760 hours.  Assessment and Plan: Patient with past medical history of intellectual disability, bipolar disorder presents with complaint of thrombocytopenia.  IR consulted for bone marrow biopsy at the request of Dr. Tina. Case reviewed by Dr. Luverne who approves patient for procedure.  Patient presents today in their usual state of health.  She has been NPO and is not currently on blood thinners.   Risks and benefits of biopsy was discussed with the patient and/or patient's family including, but not limited to bleeding, infection, damage to adjacent structures or low yield requiring additional tests.  All of the questions were answered and there is agreement to proceed.  Consent signed and in chart.   Thank you for this interesting consult.  I greatly enjoyed meeting Diamond Bowman and look forward to participating in their care.  A copy of this report was sent to the  requesting provider on this date.  Electronically Signed: Rage Beever Sue-Ellen Lallie Strahm, PA 01/06/2024, 11:00 AM   I spent a total of  30 Minutes   in face to face in clinical consultation, greater than  50% of which was counseling/coordinating care for thrombocytopenia.

## 2024-01-06 NOTE — Discharge Instructions (Addendum)
 Patient may return to her usual activities and day programs Monday 8/18.   Please call Interventional Radiology clinic [phone] with any questions or concerns.  You may remove your dressing and shower tomorrow.  After the procedure, it is common to have: Soreness Bruising Mild pain  Follow these instructions at home:  Medication: Do not use Aspirin  or ibuprofen products, such as Advil or Motrin, as it may increase bleeding You may resume your usual medications as ordered by your doctor If your doctor prescribed antibiotics, take them as directed. Do not stop taking them just because you feel better. You need to take the full course of antibiotics  Eating and drinking: Drink plenty of liquids to keep your urine pale yellow You can resume your regular diet as directed by your doctor   Care of the procedure site  Check your biopsy site every day until it heals  Keep the bandage dry. You can take the bandage off and shower tomorrow  If you are bleeding from the biopsy site, apply firm pressure on the area for at least 30 minutes  If directed, apply ice to your biopsy site 2-3 times a day.  o Put ice in a plastic bag  o Place a towel between your skin and the ice bag  o Leave the ice in place for 20 minutes 2-3 times a day  Activity Do not take baths, swim, or use a hot tub until your health care provider approves  Keep all follow-up visits as told by your doctor  Contact your doctor or seek immediate medical care if: You have bright red bleeding from the biopsy site that does not stop after 30 minutes of holding direct pressure on the site. You have signs of infection, such as: Increased pain, swelling, warmth, or redness at the biopsy site Red streaks leading from the biopsy site Yellow or green drainage from the biopsy site A fever (temperature over 100.76F) chills, or both  Moderate Conscious Sedation-Care After  This sheet gives you information about how to care for  yourself after your procedure. Your health care provider may also give you more specific instructions. If you have problems or questions, contact your health care provider.  After the procedure, it is common to have: Sleepiness for several hours. Impaired judgment for several hours. Difficulty with balance. Vomiting if you eat too soon.  Follow these instructions at home:  Rest. Do not participate in activities where you could fall or become injured. Do not drive or use machinery. Do not drink alcohol . Do not take sleeping pills or medicines that cause drowsiness. Do not make important decisions or sign legal documents. Do not take care of children on your own.  Eating and drinking Follow the diet recommended by your health care provider. Drink enough fluid to keep your urine pale yellow. If you vomit: Drink water, juice, or soup when you can drink without vomiting. Make sure you have little or no nausea before eating solid foods.  General instructions Take over-the-counter and prescription medicines only as told by your health care provider. Have a responsible adult stay with you for the time you are told. It is important to have someone help care for you until you are awake and alert. Do not smoke. Keep all follow-up visits as told by your health care provider. This is important.  Contact a health care provider if: You are still sleepy or having trouble with balance after 24 hours. You feel light-headed. You keep feeling nauseous or  you keep vomiting. You develop a rash. You have a fever. You have redness or swelling around the IV site.  Get help right away if: You have trouble breathing. You have new-onset confusion at home.  This information is not intended to replace advice given to you by your health care provider. Make sure you discuss any questions you have with your healthcare provider.

## 2024-01-06 NOTE — Procedures (Addendum)
Interventional Radiology Procedure Note  Procedure: CT guided bone marrow aspiration and biopsy  Complications: None  EBL: < 10 mL  Findings: Aspirate and core biopsy performed of bone marrow in left iliac bone.  Plan: Bedrest supine x 1 hrs  Devetta Hagenow T. Nyala Kirchner, M.D Pager:  [phone]    

## 2024-01-12 LAB — SURGICAL PATHOLOGY

## 2024-01-13 NOTE — Progress Notes (Unsigned)
 [facility] OFFICE PROGRESS NOTE  Patient Care Team: [facility], [place] as PCP - General  Assessment & Plan   No orders of the defined types were placed in this encounter.    Diamond JAYSON Chihuahua, MD  INTERVAL HISTORY: Patient returns for follow-up.  Oncology History   No history exists.     PHYSICAL EXAMINATION: ECOG PERFORMANCE STATUS: {CHL ONC ECOG [phone]}  There were no vitals filed for this visit. There were no vitals filed for this visit.  Relevant data reviewed during this visit included ***

## 2024-01-14 ENCOUNTER — Inpatient Hospital Stay: Payer: MEDICAID

## 2024-01-14 ENCOUNTER — Ambulatory Visit: Payer: Self-pay

## 2024-01-14 ENCOUNTER — Encounter (HOSPITAL_COMMUNITY): Payer: Self-pay

## 2024-01-14 ENCOUNTER — Other Ambulatory Visit: Payer: Self-pay

## 2024-01-14 VITALS — HR 99 | Temp 97.6°F | Resp 17 | Ht <= 58 in | Wt 99.3 lb

## 2024-01-14 DIAGNOSIS — D696 Thrombocytopenia, unspecified: Secondary | ICD-10-CM

## 2024-01-14 DIAGNOSIS — D693 Immune thrombocytopenic purpura: Secondary | ICD-10-CM | POA: Diagnosis present

## 2024-01-14 DIAGNOSIS — D649 Anemia, unspecified: Secondary | ICD-10-CM | POA: Diagnosis present

## 2024-01-14 LAB — CMP (CANCER CENTER ONLY)
ALT: 8 U/L (ref 0–44)
AST: 22 U/L (ref 15–41)
Albumin: 3.6 g/dL (ref 3.5–5.0)
Alkaline Phosphatase: 63 U/L (ref 38–126)
Anion gap: 4 — ABNORMAL LOW (ref 5–15)
BUN: 23 mg/dL — ABNORMAL HIGH (ref 6–20)
CO2: 35 mmol/L — ABNORMAL HIGH (ref 22–32)
Calcium: 9 mg/dL (ref 8.9–10.3)
Chloride: 102 mmol/L (ref 98–111)
Creatinine: 0.94 mg/dL (ref 0.44–1.00)
GFR, Estimated: >60 mL/min (ref >60)
Glucose, Bld: 85 mg/dL (ref 70–99)
Potassium: 4.7 mmol/L (ref 3.5–5.1)
Sodium: 141 mmol/L (ref 135–145)
Total Bilirubin: 0.3 mg/dL (ref 0.0–1.2)
Total Protein: 6.6 g/dL (ref 6.5–8.1)

## 2024-01-14 LAB — LACTATE DEHYDROGENASE: LDH: 268 U/L — ABNORMAL HIGH (ref 98–192)

## 2024-01-14 NOTE — Assessment & Plan Note (Addendum)
 Monitor for signs and symptoms of severe thrombocytopenia Report to ED if spontaneous bleeding, bruising, petechiae Follow-up in 1 year, earlier if needed.

## 2024-01-21 ENCOUNTER — Encounter (HOSPITAL_COMMUNITY): Payer: Self-pay

## 2024-02-07 ENCOUNTER — Encounter: Payer: Self-pay | Admitting: Family

## 2024-03-25 ENCOUNTER — Emergency Department (HOSPITAL_COMMUNITY)
Admission: EM | Admit: 2024-03-25 | Discharge: 2024-03-25 | Disposition: A | Discharge status: Home / Self Care | Payer: MEDICAID | Attending: Emergency Medicine | Admitting: Emergency Medicine

## 2024-03-25 ENCOUNTER — Emergency Department (HOSPITAL_COMMUNITY): Payer: MEDICAID

## 2024-03-25 DIAGNOSIS — S7012XA Contusion of left thigh, initial encounter: Secondary | ICD-10-CM | POA: Insufficient documentation

## 2024-03-25 DIAGNOSIS — X58XXXA Exposure to other specified factors, initial encounter: Secondary | ICD-10-CM | POA: Diagnosis not present

## 2024-03-25 DIAGNOSIS — S79921A Unspecified injury of right thigh, initial encounter: Secondary | ICD-10-CM | POA: Diagnosis present

## 2024-03-25 LAB — BASIC METABOLIC PANEL WITH GFR
Anion gap: 8 (ref 5–15)
BUN: 27 mg/dL — ABNORMAL HIGH (ref 6–20)
CO2: 31 mmol/L (ref 22–32)
Calcium: 8.1 mg/dL — ABNORMAL LOW (ref 8.9–10.3)
Chloride: 98 mmol/L (ref 98–111)
Creatinine, Ser: 1.3 mg/dL — ABNORMAL HIGH (ref 0.44–1.00)
GFR, Estimated: 47 mL/min — ABNORMAL LOW (ref >60)
Glucose, Bld: 126 mg/dL — ABNORMAL HIGH (ref 70–99)
Potassium: 4.5 mmol/L (ref 3.5–5.1)
Sodium: 137 mmol/L (ref 135–145)

## 2024-03-25 LAB — CBC
HCT: 31.5 % — ABNORMAL LOW (ref 36.0–46.0)
Hemoglobin: 10.3 g/dL — ABNORMAL LOW (ref 12.0–15.0)
MCH: 33.3 pg (ref 26.0–34.0)
MCHC: 32.7 g/dL (ref 30.0–36.0)
MCV: 101.9 fL — ABNORMAL HIGH (ref 80.0–100.0)
Platelets: 45 K/uL — ABNORMAL LOW (ref 150–400)
RBC: 3.09 MIL/uL — ABNORMAL LOW (ref 3.87–5.11)
RDW: 15.9 % — ABNORMAL HIGH (ref 11.5–15.5)
WBC: 5.8 K/uL (ref 4.0–10.5)
nRBC: 0 % (ref 0.0–0.2)

## 2024-03-25 NOTE — ED Triage Notes (Signed)
 Pt BIB by EMS from home. Per EMS, caregiver states pt sat down hard on the toilet. Caregiver states pt hips looked weird. Pt denies any pain. Hx of a cognitive delay.

## 2024-03-25 NOTE — ED Notes (Signed)
 Patient transported to X-ray

## 2024-03-25 NOTE — Discharge Instructions (Signed)
 The x-ray did not show any signs of fracture.  You do have a bruise on the left thigh.  That should slowly resolve over the next month

## 2024-03-25 NOTE — ED Provider Notes (Signed)
 [place] [facility]PARTMENT AT [facility] Provider Note   CSN: [id number] Arrival date & time: 03/25/24  2117     Patient presents with: Hip Pain   Diamond Bowman is a 59 y.o. female.    Hip Pain  Patient does have history of schizophrenia cognitive delay tardive dyskinesia seizures chronic kidney disease Patient presents ED for evaluation of a thigh injury.  Patient requires assistance from her caregiver.  She sat down hard on the toilet.  They were concerned that she might have a hip injury so she was brought to the ED.  Patient denies any pain or discomfort.    Prior to Admission medications   Medication Sig Start Date End Date Taking? Authorizing Provider  alendronate  (FOSAMAX ) 70 MG tablet Take 70 mg by mouth every Thursday. Take with a full glass of water on an empty stomach.    [provider]  cyproheptadine  (PERIACTIN ) 4 MG tablet Take 4 mg by mouth at bedtime.    [provider]  divalproex  (DEPAKOTE  ER) 500 MG 24 hr tablet Take 3 tablets (1,500 mg total) by mouth at bedtime. 05/15/12   Samtani, Jai-Gurmukh, MD  docusate sodium  (COLACE) 100 MG capsule Take 100 mg by mouth daily.    [provider]  Emollient (EUCERIN) lotion Apply 1 Application topically See admin instructions. Apply topically to entire body twice daily    [provider]  Ferrous Sulfate  140 (45 Fe) MG TBCR Take 1 tablet by mouth daily.    [provider]  furosemide  (LASIX ) 20 MG tablet Take 1 tablet (20 mg total) by mouth daily. Patient taking differently: Take 20 mg by mouth as needed for edema. 12/05/21   Tolia, Sunit, DO  GNP ASPIRIN  81 MG tablet TAKE ONE TABLET BY MOUTH DAILY. swallow whole Patient taking differently: Take 81 mg by mouth daily. 02/09/22   Tolia, Sunit, DO  GNP CALCIUM  500 +D3 500-15 MG-MCG TABS Take 1 tablet by mouth daily. 10/03/21   [provider]  hydrOXYzine  (VISTARIL ) 50 MG capsule Take 50 mg by mouth 2 (two)  times daily. Take 1 tablet in the morning and take 1 tablet at 2pm 07/29/21   [provider]  KRILL OIL PO Take 100 mg by mouth daily.    [provider]  levothyroxine  (SYNTHROID ) 75 MCG tablet Take 75 mcg by mouth daily. 08/18/21   [provider]  loratadine  (CLARITIN ) 10 MG tablet Take 10 mg by mouth daily.    [provider]  metoprolol  succinate (TOPROL -XL) 50 MG 24 hr tablet TAKE ONE TABLET BY MOUTH DAILY take with or immediately following a meal Patient taking differently: Take 50 mg by mouth daily. 09/30/22   Tolia, Sunit, DO  mirtazapine  (REMERON ) 15 MG tablet Take 15 mg by mouth at bedtime.    [provider]  Multiple Vitamin (MULTIVITAMIN WITH MINERALS) TABS tablet Take 1 tablet by mouth daily.    [provider]  olanzapine  zydis (ZYPREXA ) 15 MG disintegrating tablet Take 1 tablet (15 mg total) by mouth at bedtime. 01/04/15   Rizwan, Saima, MD  traZODone  (DESYREL ) 100 MG tablet Take 300 mg by mouth at bedtime.     [provider]    Allergies: Amiodarone , Rivaroxaban , and Verapamil     Review of Systems  Updated Vital Signs BP (!) 123/95 (BP Location: Left Arm)   Pulse 95   Temp (!) 97.2 F (36.2 C) (Temporal)   Resp 18   LMP  (LMP Unknown)  SpO2 98%   Physical Exam Vitals and nursing note reviewed.  Constitutional:      General: She is not in acute distress.    Appearance: She is well-developed.  HENT:     Head: Normocephalic and atraumatic.     Right Ear: External ear normal.     Left Ear: External ear normal.  Eyes:     General: No scleral icterus.       Right eye: No discharge.        Left eye: No discharge.     Conjunctiva/sclera: Conjunctivae normal.  Neck:     Trachea: No tracheal deviation.  Cardiovascular:     Rate and Rhythm: Normal rate.  Pulmonary:     Effort: Pulmonary effort is normal. No respiratory distress.     Breath sounds: No stridor.  Abdominal:     General: There is no  distension.  Musculoskeletal:        General: No swelling or deformity.     Cervical back: Neck supple.     Comments: Hematoma noted on left thigh, pelvis stable, no tenderness  Skin:    General: Skin is warm and dry.     Findings: No rash.  Neurological:     Mental Status: She is alert. Mental status is at baseline.     Cranial Nerves: No dysarthria or facial asymmetry.     Motor: No seizure activity.     (all labs ordered are listed, but only abnormal results are displayed) Labs Reviewed  CBC - Abnormal; Notable for the following components:      Result Value   RBC 3.09 (*)    Hemoglobin 10.3 (*)    HCT 31.5 (*)    MCV 101.9 (*)    RDW 15.9 (*)    Platelets 45 (*)    All other components within normal limits  BASIC METABOLIC PANEL WITH GFR - Abnormal; Notable for the following components:   Glucose, Bld 126 (*)    BUN 27 (*)    Creatinine, Ser 1.30 (*)    Calcium  8.1 (*)    GFR, Estimated 47 (*)    All other components within normal limits    EKG: None  Radiology: DG Hip Unilat W or Wo Pelvis 2-3 Views Left Result Date: 03/25/2024 EXAM: 2 or 3 VIEW(S) XRAY OF THE LEFT HIP 03/25/2024 10:06:00 PM COMPARISON: None available. CLINICAL HISTORY: Fall, pain, hematoma thigh. FINDINGS: BONES AND JOINTS: No acute fracture or focal osseous lesion. The hip joint is maintained. No significant degenerative changes. SOFT TISSUES: The soft tissues are unremarkable. IMPRESSION: 1. No significant abnormality in the left hip or visualized pelvis. Electronically signed by: Franky Crease MD 03/25/2024 10:28 PM EDT RP Workstation: [id number]     Procedures   Medications Ordered in the ED - No data to display  Clinical Course as of 03/25/24 2332  Sat Mar 25, 2024  2306 Hip x-ray without acute abnormality [JK]  2328 CBC(!) Hemoglobin slightly decreased compared to previous but otherwise stable.  Metabolic panel unremarkable.  Hip x-ray without fracture [JK]    Clinical Course User  Index [JK] Randol Simmonds, MD                                 Medical Decision Making Problems Addressed: Hematoma of left thigh, initial encounter: acute illness or injury that poses a threat to life or bodily functions  Amount and/or Complexity of Data  Reviewed Labs: ordered. Decision-making details documented in ED Course. Radiology: ordered and independent interpretation performed.   Patient's x-rays show no signs of fracture.  She does have a hematoma on her left thigh.  Patient does not have any signs of severe blood loss.  She is not have any significant pain or discomfort.  Stable for discharge home     Final diagnoses:  Hematoma of left thigh, initial encounter    ED Discharge Orders     None          Randol Simmonds, MD 03/25/24 2332

## 2024-03-30 ENCOUNTER — Other Ambulatory Visit: Payer: Self-pay

## 2024-03-30 ENCOUNTER — Emergency Department (HOSPITAL_BASED_OUTPATIENT_CLINIC_OR_DEPARTMENT_OTHER)
Admission: EM | Admit: 2024-03-30 | Discharge: 2024-03-30 | Disposition: A | Discharge status: Home / Self Care | Payer: MEDICAID

## 2024-03-30 ENCOUNTER — Emergency Department (HOSPITAL_BASED_OUTPATIENT_CLINIC_OR_DEPARTMENT_OTHER): Payer: MEDICAID | Admitting: Radiology

## 2024-03-30 ENCOUNTER — Encounter (HOSPITAL_BASED_OUTPATIENT_CLINIC_OR_DEPARTMENT_OTHER): Payer: Self-pay | Admitting: Emergency Medicine

## 2024-03-30 DIAGNOSIS — N189 Chronic kidney disease, unspecified: Secondary | ICD-10-CM | POA: Diagnosis not present

## 2024-03-30 DIAGNOSIS — S92342A Displaced fracture of fourth metatarsal bone, left foot, initial encounter for closed fracture: Secondary | ICD-10-CM | POA: Insufficient documentation

## 2024-03-30 DIAGNOSIS — W19XXXA Unspecified fall, initial encounter: Secondary | ICD-10-CM | POA: Diagnosis not present

## 2024-03-30 DIAGNOSIS — Z7982 Long term (current) use of aspirin: Secondary | ICD-10-CM | POA: Insufficient documentation

## 2024-03-30 DIAGNOSIS — S99922A Unspecified injury of left foot, initial encounter: Secondary | ICD-10-CM | POA: Diagnosis present

## 2024-03-30 DIAGNOSIS — Z79899 Other long term (current) drug therapy: Secondary | ICD-10-CM | POA: Diagnosis not present

## 2024-03-30 LAB — CBC WITH DIFFERENTIAL/PLATELET
Abs Immature Granulocytes: 0.01 K/uL (ref 0.00–0.07)
Basophils Absolute: 0 K/uL (ref 0.0–0.1)
Basophils Relative: 1 %
Eosinophils Absolute: 0.1 K/uL (ref 0.0–0.5)
Eosinophils Relative: 2 %
HCT: 33.6 % — ABNORMAL LOW (ref 36.0–46.0)
Hemoglobin: 10.9 g/dL — ABNORMAL LOW (ref 12.0–15.0)
Immature Granulocytes: 0 %
Lymphocytes Relative: 47 %
Lymphs Abs: 2.5 K/uL (ref 0.7–4.0)
MCH: 33.2 pg (ref 26.0–34.0)
MCHC: 32.4 g/dL (ref 30.0–36.0)
MCV: 102.4 fL — ABNORMAL HIGH (ref 80.0–100.0)
Monocytes Absolute: 0.6 K/uL (ref 0.1–1.0)
Monocytes Relative: 12 %
Neutro Abs: 2 K/uL (ref 1.7–7.7)
Neutrophils Relative %: 38 %
Platelets: 65 K/uL — ABNORMAL LOW (ref 150–400)
RBC: 3.28 MIL/uL — ABNORMAL LOW (ref 3.87–5.11)
RDW: 16.1 % — ABNORMAL HIGH (ref 11.5–15.5)
WBC: 5.3 K/uL (ref 4.0–10.5)
nRBC: 0 % (ref 0.0–0.2)

## 2024-03-30 LAB — BASIC METABOLIC PANEL WITH GFR
Anion gap: 4 — ABNORMAL LOW (ref 5–15)
BUN: 28 mg/dL — ABNORMAL HIGH (ref 6–20)
CO2: 34 mmol/L — ABNORMAL HIGH (ref 22–32)
Calcium: 9.5 mg/dL (ref 8.9–10.3)
Chloride: 105 mmol/L (ref 98–111)
Creatinine, Ser: 1.05 mg/dL — ABNORMAL HIGH (ref 0.44–1.00)
GFR, Estimated: >60 mL/min (ref >60)
Glucose, Bld: 88 mg/dL (ref 70–99)
Potassium: 5.3 mmol/L — ABNORMAL HIGH (ref 3.5–5.1)
Sodium: 143 mmol/L (ref 135–145)

## 2024-03-30 LAB — PROTIME-INR
INR: 0.9 (ref 0.8–1.2)
Prothrombin Time: 12.8 s (ref 11.4–15.2)

## 2024-03-30 MED ORDER — HYDROCODONE-ACETAMINOPHEN 5-325 MG PO TABS: 1.0000 | ORAL_TABLET | Freq: Four times a day (QID) | ORAL | 0 refills | Status: AC | PRN

## 2024-03-30 MED ORDER — ACETAMINOPHEN 500 MG PO TABS
1000.0000 mg | ORAL_TABLET | Freq: Once | ORAL | Status: AC
  Administered 2024-03-30: 1000 mg via ORAL
  Filled 2024-03-30: qty 2

## 2024-03-30 MED ORDER — SODIUM ZIRCONIUM CYCLOSILICATE 10 G PO PACK
10.0000 g | PACK | Freq: Once | ORAL | Status: AC
  Administered 2024-03-30: 10 g via ORAL
  Filled 2024-03-30: qty 1

## 2024-03-30 NOTE — ED Provider Notes (Signed)
 [place] EMERGENCY DEPARTMENT AT [facility] Provider Note   CSN: [id number] Arrival date & time: 03/30/24  9048     Patient presents with: No chief complaint on file.   Diamond Bowman is a 58 y.o. female.   The history is provided by the patient, medical records and a caregiver. No language interpreter was used.     58 year old female significant history of tardive dyskinesia, seizure, chronic kidney disease, schizophrenia, cognitive delay, who presents to the ER today with concerns of foot pain and toe discoloration.  Obtained through caregiver who is at bedside.  Patient had an unwitnessed fall 5 days ago when she was having trouble getting up from her commode.  She was found to have a large bruise to her left thigh.  She was seen and evaluated in the ED and had negative left hip x-ray.  She was discharged home.  Today she was complaining of pain to her left foot.  Staff noticed quite a bit of bruising and discoloration to the foot.  She has been able to bear weight and ambulate.  No other trauma that was witnessed.  No other complaint.  She is currently not on any blood thinner medication.  No other complaints such as headache or chest pain.  Prior to Admission medications   Medication Sig Start Date End Date Taking? Authorizing Provider  alendronate  (FOSAMAX ) 70 MG tablet Take 70 mg by mouth every Thursday. Take with a full glass of water on an empty stomach.    [provider]  cyproheptadine  (PERIACTIN ) 4 MG tablet Take 4 mg by mouth at bedtime.    [provider]  divalproex  (DEPAKOTE  ER) 500 MG 24 hr tablet Take 3 tablets (1,500 mg total) by mouth at bedtime. 05/15/12   Samtani, Jai-Gurmukh, MD  docusate sodium  (COLACE) 100 MG capsule Take 100 mg by mouth daily.    [provider]  Emollient (EUCERIN) lotion Apply 1 Application topically See admin instructions. Apply topically to entire body twice daily    [provider]  Ferrous  Sulfate 140 (45 Fe) MG TBCR Take 1 tablet by mouth daily.    [provider]  furosemide  (LASIX ) 20 MG tablet Take 1 tablet (20 mg total) by mouth daily. Patient taking differently: Take 20 mg by mouth as needed for edema. 12/05/21   Tolia, Sunit, DO  GNP ASPIRIN  81 MG tablet TAKE ONE TABLET BY MOUTH DAILY. swallow whole Patient taking differently: Take 81 mg by mouth daily. 02/09/22   Tolia, Sunit, DO  GNP CALCIUM  500 +D3 500-15 MG-MCG TABS Take 1 tablet by mouth daily. 10/03/21   [provider]  hydrOXYzine  (VISTARIL ) 50 MG capsule Take 50 mg by mouth 2 (two) times daily. Take 1 tablet in the morning and take 1 tablet at 2pm 07/29/21   [provider]  KRILL OIL PO Take 100 mg by mouth daily.    [provider]  levothyroxine  (SYNTHROID ) 75 MCG tablet Take 75 mcg by mouth daily. 08/18/21   [provider]  loratadine  (CLARITIN ) 10 MG tablet Take 10 mg by mouth daily.    [provider]  metoprolol  succinate (TOPROL -XL) 50 MG 24 hr tablet TAKE ONE TABLET BY MOUTH DAILY take with or immediately following a meal Patient taking differently: Take 50 mg by mouth daily. 09/30/22   Tolia, Sunit, DO  mirtazapine  (REMERON ) 15 MG tablet Take 15 mg by mouth at bedtime.    [provider]  Multiple Vitamin (MULTIVITAMIN WITH  MINERALS) TABS tablet Take 1 tablet by mouth daily.    [provider]  olanzapine  zydis (ZYPREXA ) 15 MG disintegrating tablet Take 1 tablet (15 mg total) by mouth at bedtime. 01/04/15   Rizwan, Saima, MD  traZODone  (DESYREL ) 100 MG tablet Take 300 mg by mouth at bedtime.     [provider]    Allergies: Amiodarone , Rivaroxaban , and Verapamil     Review of Systems  All other systems reviewed and are negative.   Updated Vital Signs BP 124/70 (BP Location: Left Arm)   Pulse 65   Temp 97.7 F (36.5 C) (Oral)   Resp 17   LMP  (LMP Unknown)   SpO2 99%   Physical Exam Vitals and nursing note reviewed.   Constitutional:      General: She is not in acute distress.    Appearance: She is well-developed.     Comments: Patient is laying in bed appears to be in no acute discomfort.  HENT:     Head: Atraumatic.  Eyes:     Conjunctiva/sclera: Conjunctivae normal.  Pulmonary:     Effort: Pulmonary effort is normal.  Musculoskeletal:        General: Tenderness (Left lower extremity: Large ecchymosis noted to lateral left thigh.  Ecchymosis noted to the sole of left foot as well as the 3rd and 4th toe with associated edema.  Intact pedal pulse with brisk cap refill.  Left ankle full range of motion) present.     Cervical back: Neck supple.  Skin:    Findings: No rash.  Neurological:     Mental Status: She is alert.  Psychiatric:        Mood and Affect: Mood normal.     (all labs ordered are listed, but only abnormal results are displayed) Labs Reviewed  CBC WITH DIFFERENTIAL/PLATELET - Abnormal; Notable for the following components:      Result Value   RBC 3.28 (*)    Hemoglobin 10.9 (*)    HCT 33.6 (*)    MCV 102.4 (*)    RDW 16.1 (*)    Platelets 65 (*)    All other components within normal limits  BASIC METABOLIC PANEL WITH GFR - Abnormal; Notable for the following components:   Potassium 5.3 (*)    CO2 34 (*)    BUN 28 (*)    Creatinine, Ser 1.05 (*)    Anion gap 4 (*)    All other components within normal limits  PROTIME-INR    EKG: None  Radiology: DG Tibia/Fibula Right Result Date: 03/30/2024 EXAM: 2 VIEW(S) XRAY OF THE RIGHT TIBIA AND FIBULA 03/30/2024 11:08:00 AM COMPARISON: None available. CLINICAL HISTORY: Bruising. FINDINGS: BONES AND JOINTS: No acute fracture. No focal osseous lesion. No joint dislocation. SOFT TISSUES: The soft tissues are unremarkable. IMPRESSION: 1. No significant abnormality. Electronically signed by: Lynwood Seip MD 03/30/2024 12:14 PM EST RP Workstation: [id number]   DG Foot Complete Left Result Date: 03/30/2024 EXAM: 3 or more VIEW(S) XRAY  OF THE LEFT FOOT 03/30/2024 11:08:00 AM COMPARISON: 08/15/2007 CLINICAL HISTORY: bruise FINDINGS: BONES AND JOINTS: Mildly displaced oblique fracture of distal fourth metatarsal is noted. No focal osseous lesion. No joint dislocation. SOFT TISSUES: The soft tissues are unremarkable. IMPRESSION: 1. Mildly displaced oblique fracture of the distal fourth metatarsal. Electronically signed by: Lynwood Seip MD 03/30/2024 12:12 PM EST RP Workstation: [id number]     Procedures   Medications Ordered in the ED  sodium zirconium cyclosilicate  (LOKELMA ) packet 10 g (has no administration  in time range)  acetaminophen  (TYLENOL ) tablet 1,000 mg (1,000 mg Oral Given 03/30/24 1114)                                    Medical Decision Making Amount and/or Complexity of Data Reviewed Labs: ordered. Radiology: ordered.  Risk OTC drugs. Prescription drug management.   BP 124/70 (BP Location: Left Arm)   Pulse 65   Temp 97.7 F (36.5 C) (Oral)   Resp 17   LMP  (LMP Unknown)   SpO2 99%   42:55 AM  58 year old female significant history of tardive dyskinesia, seizure, chronic kidney disease, schizophrenia, cognitive delay, who presents to the ER today with concerns of foot pain and toe discoloration.  Obtained through caregiver who is at bedside.  Patient had an unwitnessed fall 5 days ago when she was having trouble getting up from her commode.  She was found to have a large bruise to her left thigh.  She was seen and evaluated in the ED and had negative left hip x-ray.  She was discharged home.  Today she was complaining of pain to her left foot.  Staff noticed quite a bit of bruising and discoloration to the foot.  She has been able to bear weight and ambulate.  No other trauma that was witnessed.  No other complaint.  She is currently not on any blood thinner medication.  No other complaints such as headache or chest pain.  On exam patient is resting comfortably in bed appears to be in no acute  discomfort.  Examination of her skin is remarkable for a large ecchymosis to her left lateral thigh with mild tenderness to palpation.  She does have some edema involving the left lower extremities with moderate ecchymosis to her left foot including the sole and the 4th and 5th toe.  Her left ankle with full range of motion.  She does have intact distal pulses and intact sensation.  She also has some bruising to her right lateral tib-fib and a faint bruising noted to her right anterior knee.  There was area nontender to palpation.  She is able to flex and extend the knee without difficulty.  She does not have any calf tenderness.  Given history of cognitive delay and a poor historian, will obtain x-ray of the right tib-fib as well as x-ray of the left foot for further assessment.  Patient does not appear to have any ischemic foot.  I also have low suspicion for DVT causing her symptoms.  No evidence of cellulitis.  Her leg compartment is soft.  -Labs ordered, independently viewed and interpreted by me.  Labs remarkable for K+ 5.3, lokelma  given.  Plt 65, similar to prior -The patient was maintained on a cardiac monitor.  I personally viewed and interpreted the cardiac monitored which showed an underlying rhythm of: NSR -Imaging independently viewed and interpreted by me and I agree with radiologist's interpretation.  Result remarkable for L foot xray demonstrates mildly displaced oblique fx of the distal 4th metatarsal.  This is a closed injury.  Will place in postop shoe and give outpt f/u with ortho -This patient presents to the ED for concern of bruising, this involves an extensive number of treatment options, and is a complaint that carries with it a high risk of complications and morbidity.  The differential diagnosis includes fx, dislocation, strain, sprain, contusion, spontaneous bleed -Co morbidities that complicate the patient  evaluation includes thrombocytopenia, osteoporosis, developmental  delay -Treatment includes lokelma , tylenol , postop shoe -Reevaluation of the patient after these medicines showed that the patient improved -PCP office notes or outside notes reviewed -Escalation to admission/observation considered: patients feels much better, is comfortable with discharge, and will follow up with PCP -Prescription medication considered, patient comfortable with vicodin, postop shoe -Social Determinant of Health considered which includes tobacco use      Final diagnoses:  Displaced fracture of fourth metatarsal bone, left foot, initial encounter for closed fracture    ED Discharge Orders          Ordered    HYDROcodone -acetaminophen  (NORCO/VICODIN) 5-325 MG tablet  Every 6 hours PRN        03/30/24 1247               Nivia Colon, PA-C 03/30/24 1252    Kammerer, Megan L, DO 03/31/24 1302

## 2024-03-30 NOTE — ED Triage Notes (Signed)
 Brought in by [facility].   Had fall on Saturday. Seen at [facility] ED and d/c. Noticed this morning bruising to 4th toe and bottom of foot.

## 2024-03-30 NOTE — Discharge Instructions (Addendum)
 You have been evaluated for your foot pain.  Xray shows that you have a 4th metatarsal fracture causing pain and swelling.  Wear postop shoe for the next 4-6 weeks for support.  Call and follow up with orthopedist specialist next week for outpatient evaluation. Take vicodin as needed for pain.

## 2024-04-13 ENCOUNTER — Emergency Department (HOSPITAL_COMMUNITY): Payer: MEDICAID

## 2024-04-13 ENCOUNTER — Encounter (HOSPITAL_COMMUNITY): Payer: Self-pay

## 2024-04-13 ENCOUNTER — Emergency Department (HOSPITAL_COMMUNITY)
Admission: EM | Admit: 2024-04-13 | Discharge: 2024-04-13 | Disposition: A | Discharge status: Home / Self Care | Payer: MEDICAID | Attending: Emergency Medicine | Admitting: Emergency Medicine

## 2024-04-13 ENCOUNTER — Other Ambulatory Visit: Payer: Self-pay

## 2024-04-13 DIAGNOSIS — B974 Respiratory syncytial virus as the cause of diseases classified elsewhere: Secondary | ICD-10-CM | POA: Diagnosis not present

## 2024-04-13 DIAGNOSIS — B338 Other specified viral diseases: Secondary | ICD-10-CM | POA: Insufficient documentation

## 2024-04-13 DIAGNOSIS — R0602 Shortness of breath: Secondary | ICD-10-CM | POA: Diagnosis present

## 2024-04-13 DIAGNOSIS — N189 Chronic kidney disease, unspecified: Secondary | ICD-10-CM | POA: Diagnosis not present

## 2024-04-13 LAB — COMPREHENSIVE METABOLIC PANEL WITH GFR
ALT: 13 U/L (ref 0–44)
AST: 22 U/L (ref 15–41)
Albumin: 2.5 g/dL — ABNORMAL LOW (ref 3.5–5.0)
Alkaline Phosphatase: 46 U/L (ref 38–126)
Anion gap: 15 (ref 5–15)
BUN: 16 mg/dL (ref 6–20)
CO2: 30 mmol/L (ref 22–32)
Calcium: 8.7 mg/dL — ABNORMAL LOW (ref 8.9–10.3)
Chloride: 97 mmol/L — ABNORMAL LOW (ref 98–111)
Creatinine, Ser: 0.97 mg/dL (ref 0.44–1.00)
GFR, Estimated: >60 mL/min (ref >60)
Glucose, Bld: 92 mg/dL (ref 70–99)
Potassium: 4.6 mmol/L (ref 3.5–5.1)
Sodium: 142 mmol/L (ref 135–145)
Total Bilirubin: 0.4 mg/dL (ref 0.0–1.2)
Total Protein: 6.2 g/dL — ABNORMAL LOW (ref 6.5–8.1)

## 2024-04-13 LAB — RESP PANEL BY RT-PCR (RSV, FLU A&B, COVID)  RVPGX2
Influenza A by PCR: NEGATIVE
Influenza B by PCR: NEGATIVE
Resp Syncytial Virus by PCR: POSITIVE — AB
SARS Coronavirus 2 by RT PCR: NEGATIVE

## 2024-04-13 LAB — CBC WITH DIFFERENTIAL/PLATELET
Abs Immature Granulocytes: 0.04 K/uL (ref 0.00–0.07)
Basophils Absolute: 0 K/uL (ref 0.0–0.1)
Basophils Relative: 1 %
Eosinophils Absolute: 0.1 K/uL (ref 0.0–0.5)
Eosinophils Relative: 1 %
HCT: 35.7 % — ABNORMAL LOW (ref 36.0–46.0)
Hemoglobin: 11.8 g/dL — ABNORMAL LOW (ref 12.0–15.0)
Immature Granulocytes: 1 %
Lymphocytes Relative: 27 %
Lymphs Abs: 1.6 K/uL (ref 0.7–4.0)
MCH: 34.1 pg — ABNORMAL HIGH (ref 26.0–34.0)
MCHC: 33.1 g/dL (ref 30.0–36.0)
MCV: 103.2 fL — ABNORMAL HIGH (ref 80.0–100.0)
Monocytes Absolute: 1.1 K/uL — ABNORMAL HIGH (ref 0.1–1.0)
Monocytes Relative: 18 %
Neutro Abs: 3.3 K/uL (ref 1.7–7.7)
Neutrophils Relative %: 52 %
Platelets: 47 K/uL — ABNORMAL LOW (ref 150–400)
RBC: 3.46 MIL/uL — ABNORMAL LOW (ref 3.87–5.11)
RDW: 15.2 % (ref 11.5–15.5)
WBC: 6.2 K/uL (ref 4.0–10.5)
nRBC: 0 % (ref 0.0–0.2)

## 2024-04-13 LAB — URINALYSIS, ROUTINE W REFLEX MICROSCOPIC
Bilirubin Urine: NEGATIVE
Glucose, UA: NEGATIVE mg/dL
Hgb urine dipstick: NEGATIVE
Ketones, ur: NEGATIVE mg/dL
Leukocytes,Ua: NEGATIVE
Nitrite: NEGATIVE
Protein, ur: NEGATIVE mg/dL
Specific Gravity, Urine: 1.014 (ref 1.005–1.030)
pH: 7 (ref 5.0–8.0)

## 2024-04-13 LAB — VALPROIC ACID LEVEL: Valproic Acid Lvl: 95 ug/mL (ref 50–100)

## 2024-04-13 MED ORDER — SODIUM CHLORIDE 0.9 % IV BOLUS
1000.0000 mL | Freq: Once | INTRAVENOUS | Status: AC
  Administered 2024-04-13: 1000 mL via INTRAVENOUS

## 2024-04-13 MED ORDER — ACETAMINOPHEN 500 MG PO TABS
1000.0000 mg | ORAL_TABLET | Freq: Once | ORAL | Status: AC
  Administered 2024-04-13: 1000 mg via ORAL
  Filled 2024-04-13: qty 2

## 2024-04-13 NOTE — Discharge Instructions (Signed)
 Tylenol , fluids, rest are the best medicine for respiratory syncytial virus.  Return here for concerning changes in your condition.

## 2024-04-13 NOTE — ED Provider Notes (Signed)
 [place] EMERGENCY DEPARTMENT AT [facility] Provider Note   CSN: [id number] Arrival date & time: 04/13/24  9281     Patient presents with: Weakness   Diamond Bowman is a 58 y.o. female.   HPI Pt bibems from home. Pt was shob walking to eat breakfast. Pt was on the couch when EMS arrived. Pt was able to walk to the stretcher without assistance. EMS did not witness any shob and the pt seem fine with them. Normal vital signs.   Patient asks for a coloring book, orange juice, seemingly denies pain.  Level 5 caveat secondary to cognitive impairment.    Prior to Admission medications   Medication Sig Start Date End Date Taking? Authorizing Provider  alendronate  (FOSAMAX ) 70 MG tablet Take 70 mg by mouth every Thursday. Take with a full glass of water on an empty stomach.   Yes [provider]  cyproheptadine  (PERIACTIN ) 4 MG tablet Take 4 mg by mouth at bedtime.   Yes [provider]  divalproex  (DEPAKOTE  ER) 500 MG 24 hr tablet Take 3 tablets (1,500 mg total) by mouth at bedtime. 05/15/12  Yes Samtani, Jai-Gurmukh, MD  docusate sodium  (COLACE) 100 MG capsule Take 100 mg by mouth daily.   Yes [provider]  Emollient (EUCERIN) lotion Apply 1 Application topically See admin instructions. Apply topically to entire body twice daily   Yes [provider]  Ferrous Sulfate  140 (45 Fe) MG TBCR Take 1 tablet by mouth daily.   Yes [provider]  furosemide  (LASIX ) 20 MG tablet Take 1 tablet (20 mg total) by mouth daily. Patient taking differently: Take 20 mg by mouth as needed for edema or fluid. 12/05/21  Yes Tolia, Sunit, DO  GNP ASPIRIN  81 MG tablet TAKE ONE TABLET BY MOUTH DAILY. swallow whole Patient taking differently: Take 81 mg by mouth daily. 02/09/22  Yes Tolia, Sunit, DO  GNP CALCIUM  500 +D3 500-15 MG-MCG TABS Take 1 tablet by mouth daily. 10/03/21  Yes [provider]  hydrOXYzine  (VISTARIL ) 50 MG capsule Take 50 mg  by mouth 2 (two) times daily. Take 1 tablet in the morning and take 1 tablet at 2pm 07/29/21  Yes [provider]  KRILL OIL PO Take 100 mg by mouth daily.   Yes [provider]  levothyroxine  (SYNTHROID ) 75 MCG tablet Take 75 mcg by mouth daily. 08/18/21  Yes [provider]  loratadine  (CLARITIN ) 10 MG tablet Take 10 mg by mouth daily.   Yes [provider]  metoprolol  succinate (TOPROL -XL) 50 MG 24 hr tablet TAKE ONE TABLET BY MOUTH DAILY take with or immediately following a meal Patient taking differently: Take 50 mg by mouth daily. 09/30/22  Yes Tolia, Sunit, DO  mirtazapine  (REMERON ) 15 MG tablet Take 15 mg by mouth at bedtime.   Yes [provider]  Multiple Vitamin (MULTIVITAMIN WITH MINERALS) TABS tablet Take 1 tablet by mouth daily.   Yes [provider]  olanzapine  zydis (ZYPREXA ) 15 MG disintegrating tablet Take 1 tablet (15 mg total) by mouth at bedtime. 01/04/15  Yes Rizwan, Saima, MD  RESTASIS 0.05 % ophthalmic emulsion Place 1 drop into both eyes 2 (two) times daily. 02/24/24  Yes [provider]  traZODone  (DESYREL ) 100 MG tablet Take 300 mg by mouth at bedtime.    Yes [provider]  HYDROcodone -acetaminophen  (NORCO/VICODIN) 5-325 MG tablet Take 1 tablet by mouth every 6 (six) hours as needed for moderate pain (pain score 4-6). Patient not  taking: Reported on 04/13/2024 03/30/24   Nivia Colon, PA-C    Allergies: Amiodarone , Nsaids, Rivaroxaban , and Verapamil     Review of Systems  Updated Vital Signs BP 134/84   Pulse (!) 110   Temp (S) (!) 101.8 F (38.8 C) (Oral) Comment: RN notified  Resp 18   Ht 1.372 m (4' 6)   Wt 45 kg   LMP  (LMP Unknown)   SpO2 100%   BMI 23.92 kg/m   Physical Exam Vitals and nursing note reviewed.  Constitutional:      General: She is not in acute distress.    Appearance: She is well-developed. She is not toxic-appearing or diaphoretic.  HENT:     Head: Normocephalic and  atraumatic.  Eyes:     Conjunctiva/sclera: Conjunctivae normal.  Cardiovascular:     Rate and Rhythm: Normal rate and regular rhythm.  Pulmonary:     Effort: Pulmonary effort is normal. No respiratory distress.     Breath sounds: Normal breath sounds. No stridor.  Abdominal:     General: There is no distension.  Musculoskeletal:     Comments: Right foot with immobilization boot in place.  Skin:    General: Skin is warm and dry.  Neurological:     Mental Status: She is alert.     Cranial Nerves: No cranial nerve deficit.     Motor: Tremor, atrophy and abnormal muscle tone present.  Psychiatric:        Behavior: Behavior is withdrawn.        Cognition and Memory: Cognition is impaired.     (all labs ordered are listed, but only abnormal results are displayed) Labs Reviewed  RESP PANEL BY RT-PCR (RSV, FLU A&B, COVID)  RVPGX2 - Abnormal; Notable for the following components:      Result Value   Resp Syncytial Virus by PCR POSITIVE (*)    All other components within normal limits  COMPREHENSIVE METABOLIC PANEL WITH GFR - Abnormal; Notable for the following components:   Chloride 97 (*)    Calcium  8.7 (*)    Total Protein 6.2 (*)    Albumin 2.5 (*)    All other components within normal limits  CBC WITH DIFFERENTIAL/PLATELET - Abnormal; Notable for the following components:   RBC 3.46 (*)    Hemoglobin 11.8 (*)    HCT 35.7 (*)    MCV 103.2 (*)    MCH 34.1 (*)    Platelets 47 (*)    Monocytes Absolute 1.1 (*)    All other components within normal limits  URINALYSIS, ROUTINE W REFLEX MICROSCOPIC  VALPROIC  ACID LEVEL    EKG: EKG Interpretation Date/Time:  Thursday April 13 2024 08:02:51 EST Ventricular Rate:  83 PR Interval:    QRS Duration:  73 QT Interval:  357 QTC Calculation: 420 R Axis:   71  Text Interpretation: Sinus rhythm Artifact ST-t wave abnormality Confirmed by Garrick Charleston [phone]) on 04/13/2024 9:55:35 AM  Radiology: [facility] Chest Port 1 View Result  Date: 04/13/2024 EXAM: 1 VIEW(S) XRAY OF THE CHEST 04/13/2024 07:48:24 AM COMPARISON: 11/05/21. CLINICAL HISTORY: AMS FINDINGS: LUNGS AND PLEURA: No focal pulmonary opacity. No pleural effusion. No pneumothorax. HEART AND MEDIASTINUM: No acute abnormality of the cardiac and mediastinal silhouettes. BONES AND SOFT TISSUES: No acute fracture or destructive lesion. Moderate multilevel thoracic osteophytosis. IMPRESSION: 1. No acute cardiopulmonary or osseous abnormality. 2. Moderate multilevel thoracic osteophytosis. Electronically signed by: Waddell Calk MD 04/13/2024 07:53 AM EST RP Workstation: [id number]     Procedures  Medications Ordered in the ED  sodium chloride  0.9 % bolus 1,000 mL (1,000 mLs Intravenous New Bag/Given 04/13/24 1137)  acetaminophen  (TYLENOL ) tablet 1,000 mg (1,000 mg Oral Given 04/13/24 1221)                                    Medical Decision Making Patient with schizophrenia, bipolar disorder, intellectual disability, CKD, A-fib, ITP now presents with weakness or altered mental status according to caregivers who are not currently available. Initial exam generally reassuring, aside from mild tremor, patient is in no distress, is able to verbalize requests for orange juice, coloring book.  Given her substantial medical history, unclear presentation, broad differential including progression of disease, infection, bacteremia, sepsis, subthreshold seizure all considered.  Amount and/or Complexity of Data Reviewed Independent Historian: EMS External Data Reviewed: notes.    Details: Recent hematology notes reviewed ITP Labs: ordered. Decision-making details documented in ED Course. Radiology: ordered and independent interpretation performed. Decision-making details documented in ED Course. ECG/medicine tests: ordered and independent interpretation performed. Decision-making details documented in ED Course.  Risk OTC drugs. Prescription drug management. Decision  regarding hospitalization. Diagnosis or treatment significantly limited by social determinants of health.   Update: Patient accompanied by the caregiver at her group home.  We discussed the illness, symptoms may began about 2 days ago, patient goes to day program, was there yesterday.  2:30 PM Vital signs now normal, patient chattering, asking for food.  She has been upright, though she did require assistance, was not entirely atypical.  With unremarkable vital signs, no distress, the patient now much more verbal than on arrival, I had a lengthy conversation with caregiver about RSV in adults, absence of evidence for any specific therapy, beyond supportive care, rest. Patient will return to her nursing facility.   Final diagnoses:  Infection due to respiratory syncytial virus (RSV), unspecified infection type     Garrick Charleston, MD 04/13/24 1440

## 2024-04-13 NOTE — ED Notes (Signed)
 Pt brief changed.

## 2024-04-13 NOTE — ED Notes (Signed)
 Pt walked w/ assistance from NT & RN

## 2024-04-13 NOTE — ED Triage Notes (Signed)
 Pt bibems from home. Pt was shob walking to eat breakfast. Pt was on the couch when EMS arrived. Pt was able to walk to the stretcher without assistance. EMS did not witness any shob and the pt seem fine with them. Normal vital signs.

## 2024-06-19 ENCOUNTER — Emergency Department (HOSPITAL_COMMUNITY): Payer: MEDICAID

## 2024-06-19 ENCOUNTER — Emergency Department (HOSPITAL_COMMUNITY)
Admission: EM | Admit: 2024-06-19 | Discharge: 2024-06-19 | Disposition: A | Discharge status: Home / Self Care | Payer: MEDICAID | Attending: Emergency Medicine | Admitting: Emergency Medicine

## 2024-06-19 DIAGNOSIS — W19XXXA Unspecified fall, initial encounter: Secondary | ICD-10-CM | POA: Insufficient documentation

## 2024-06-19 DIAGNOSIS — S8001XA Contusion of right knee, initial encounter: Secondary | ICD-10-CM | POA: Insufficient documentation

## 2024-06-19 DIAGNOSIS — S8991XA Unspecified injury of right lower leg, initial encounter: Secondary | ICD-10-CM | POA: Diagnosis present

## 2024-06-19 DIAGNOSIS — M79604 Pain in right leg: Secondary | ICD-10-CM

## 2024-06-19 MED ORDER — ACETAMINOPHEN 500 MG PO TABS: 1000.0000 mg | ORAL_TABLET | ORAL | Status: DC

## 2024-06-19 NOTE — ED Provider Notes (Signed)
 " [place] EMERGENCY DEPARTMENT AT [facility] Provider Note   CSN: [id number] Arrival date & time: 06/19/24  1044     Patient presents with: Fall and Leg Pain   Diamond Bowman is a 59 y.o. female.    Fall  Leg Pain Pt complains of right leg pain.  Patient with fall yesterday.  Patient was able to ambulate after the fall.  Pain apparently was worse today according to caregiver however patient is minimally verbal--caregivers states that she is at her baseline.  Patient had difficulty ambulating secondary to pain.  She was able to stand and pivot with EMS.       Prior to Admission medications  Medication Sig Start Date End Date Taking? Authorizing Provider  alendronate  (FOSAMAX ) 70 MG tablet Take 70 mg by mouth every Thursday. Take with a full glass of water on an empty stomach.    [provider]  cyproheptadine  (PERIACTIN ) 4 MG tablet Take 4 mg by mouth at bedtime.    [provider]  divalproex  (DEPAKOTE  ER) 500 MG 24 hr tablet Take 3 tablets (1,500 mg total) by mouth at bedtime. 05/15/12   Samtani, Jai-Gurmukh, MD  docusate sodium  (COLACE) 100 MG capsule Take 100 mg by mouth daily.    [provider]  Emollient (EUCERIN) lotion Apply 1 Application topically See admin instructions. Apply topically to entire body twice daily    [provider]  Ferrous Sulfate  140 (45 Fe) MG TBCR Take 1 tablet by mouth daily.    [provider]  furosemide  (LASIX ) 20 MG tablet Take 1 tablet (20 mg total) by mouth daily. Patient taking differently: Take 20 mg by mouth as needed for edema or fluid. 12/05/21   Tolia, Sunit, DO  GNP ASPIRIN  81 MG tablet TAKE ONE TABLET BY MOUTH DAILY. swallow whole Patient taking differently: Take 81 mg by mouth daily. 02/09/22   Tolia, Sunit, DO  GNP CALCIUM  500 +D3 500-15 MG-MCG TABS Take 1 tablet by mouth daily. 10/03/21   [provider]  HYDROcodone -acetaminophen  (NORCO/VICODIN) 5-325 MG tablet Take 1  tablet by mouth every 6 (six) hours as needed for moderate pain (pain score 4-6). Patient not taking: Reported on 04/13/2024 03/30/24   Nivia Colon, PA-C  hydrOXYzine  (VISTARIL ) 50 MG capsule Take 50 mg by mouth 2 (two) times daily. Take 1 tablet in the morning and take 1 tablet at 2pm 07/29/21   [provider]  KRILL OIL PO Take 100 mg by mouth daily.    [provider]  levothyroxine  (SYNTHROID ) 75 MCG tablet Take 75 mcg by mouth daily. 08/18/21   [provider]  loratadine  (CLARITIN ) 10 MG tablet Take 10 mg by mouth daily.    [provider]  metoprolol  succinate (TOPROL -XL) 50 MG 24 hr tablet TAKE ONE TABLET BY MOUTH DAILY take with or immediately following a meal Patient taking differently: Take 50 mg by mouth daily. 09/30/22   Tolia, Sunit, DO  mirtazapine  (REMERON ) 15 MG tablet Take 15 mg by mouth at bedtime.    [provider]  Multiple Vitamin (MULTIVITAMIN WITH MINERALS) TABS tablet Take 1 tablet by mouth daily.    [provider]  olanzapine  zydis (ZYPREXA ) 15 MG disintegrating tablet Take 1 tablet (15 mg total) by mouth at bedtime. 01/04/15   Rizwan, Saima, MD  RESTASIS 0.05 % ophthalmic emulsion Place 1 drop into both eyes 2 (two) times daily. 02/24/24   [provider]  traZODone  (DESYREL ) 100 MG tablet Take  300 mg by mouth at bedtime.     [provider]    Allergies: Amiodarone , Nsaids, Rivaroxaban , and Verapamil     Review of Systems  Updated Vital Signs BP 129/76   Pulse 78   Temp 98.5 F (36.9 C) (Oral)   Resp 16   LMP  (LMP Unknown)   SpO2 96%   Physical Exam Vitals and nursing note reviewed.  Constitutional:      General: She is not in acute distress.    Appearance: Normal appearance. She is not ill-appearing.  HENT:     Head: Normocephalic and atraumatic.  Eyes:     General: No scleral icterus.       Right eye: No discharge.        Left eye: No discharge.     Conjunctiva/sclera: Conjunctivae  normal.  Pulmonary:     Effort: Pulmonary effort is normal.     Breath sounds: No stridor.  Musculoskeletal:     Comments: Full range of motion bilateral lower extremities at the ankle knee and hip no discomfort with range of motion no palpable tenderness.  DP PT pulses normal  Skin:    General: Skin is warm and dry.     Comments: Scant bruising to the right knee  Neurological:     Mental Status: She is alert. Mental status is at baseline.     Comments: Baseline dysarthria     (all labs ordered are listed, but only abnormal results are displayed) Labs Reviewed - No data to display  EKG: None  Radiology: DG Tibia/Fibula Right Result Date: 06/19/2024 CLINICAL DATA:  Right lower extremity pain after falling EXAM: RIGHT TIBIA AND FIBULA - 2 VIEW COMPARISON:  None Available. FINDINGS: There is no evidence of fracture or other focal bone lesions. Questionable lateral displacement of the patella on the frontal view although there may be some underlying obliquity. Reticulation of the subcutaneous fat of the lower extremity consistent with edema versus cellulitis. IMPRESSION: 1. Question lateral displacement of the patella on the frontal view. Assess for patellar instability/subluxation. 2. No evidence of fracture or joint effusion. 3. Diffuse reticulation of the subcutaneous fat of the lower leg may reflect edema or cellulitis. Electronically Signed   By: Wilkie Lent M.D.   On: 06/19/2024 11:57   DG Pelvis 1-2 Views Result Date: 06/19/2024 EXAM: 1 OR 2 VIEW(S) XRAY OF THE PELVIS 06/19/2024 11:31:00 AM COMPARISON: 03/25/2024 CLINICAL HISTORY: Fall. FINDINGS: BONES AND JOINTS: No acute fracture. No malalignment. SOFT TISSUES: Unremarkable. IMPRESSION: 1. No evidence of acute traumatic injury. Electronically signed by: Waddell Calk MD 06/19/2024 11:56 AM EST RP Workstation: [id number]   DG Femur Min 2 Views Right Result Date: 06/19/2024 EXAM: 2 VIEW(S) XRAY OF THE FEMUR 06/19/2024 11:31:00  AM COMPARISON: None available. CLINICAL HISTORY: Patient fell. FINDINGS: BONES AND JOINTS: No acute fracture. No malalignment. SOFT TISSUES: Unremarkable. IMPRESSION: 1. No evidence of acute traumatic injury. Electronically signed by: Waddell Calk MD 06/19/2024 11:51 AM EST RP Workstation: [id number]     Procedures   Medications Ordered in the ED  acetaminophen  (TYLENOL ) tablet 1,000 mg (has no administration in time range)                                    Medical Decision Making Risk OTC drugs.   Pt complains of right leg pain.  Patient with fall yesterday.  Patient was able to ambulate after the fall.  Pain apparently was worse today according to caregiver however patient is minimally verbal--caregivers states that she is at her baseline.  Patient had difficulty ambulating secondary to pain.  She was able to stand and pivot with EMS.   Lateral riding right patella it is not dislocated.  She is able to bend her knee without discomfort no palpable tenderness I placed her in a knee brace and recommend that she follow-up with orthopedics.  Final diagnoses:  Fall, initial encounter  Right leg pain    ED Discharge Orders     None          Neldon Hamp RAMAN, [place] 06/19/24 1543  "

## 2024-06-19 NOTE — Progress Notes (Signed)
 Orthopedic Tech Progress Note Patient Details:  Diamond Bowman 1965/05/27 [id number]  Ortho Devices Type of Ortho Device: Knee Sleeve Ortho Device/Splint Location: RLE Ortho Device/Splint Interventions: Ordered, Application, Adjustment   Post Interventions Patient Tolerated: Well Instructions Provided: Care of device  [name] Pac 06/19/2024, 2:44 PM

## 2024-06-19 NOTE — ED Provider Triage Note (Signed)
 Emergency Medicine Provider Triage Evaluation Note  KAMEISHA MALICKI , a 59 y.o. female  was evaluated in triage.  Pt complains of right leg pain.  Patient with fall yesterday.  Patient was able to ambulate after the fall.  Pain apparently was worse today.  Patient had difficulty ambulating secondary to pain.  She was able to stand and pivot with EMS.  Review of Systems  Positive: Right leg pain Negative: No reported head injury or LOC or AMS  Physical Exam  BP (!) 159/68 (BP Location: Right Arm)   Pulse 67   Temp 98.5 F (36.9 C) (Oral)   Resp 18   LMP  (LMP Unknown)   SpO2 98%  Gen:   Awake, no distress   Resp:  Normal effort  MSK:   Moves extremities without difficulty  Other:    Medical Decision Making  Medically screening exam initiated at 11:00 AM.  Appropriate orders placed.  CHELISA HENNEN was informed that the remainder of the evaluation will be completed by another provider, this initial triage assessment does not replace that evaluation, and the importance of remaining in the ED until their evaluation is complete.     Laurice Maude BROCKS, MD 06/19/24 1102

## 2024-06-19 NOTE — ED Triage Notes (Signed)
 Patient from home via [facility]. Had fall yesterday and EMS evaluated after fall so did not transport since she was ambulatory. Complains of RLE pain. Able to stand and pivot with EMS.

## 2024-06-19 NOTE — Discharge Instructions (Signed)
 Follow-up with your orthopedic doctor.  I have given you a hinged knee brace to wear.  Take Tylenol  1000 mg every 6 hours as needed for pain.

## 2025-01-19 ENCOUNTER — Other Ambulatory Visit: Payer: MEDICAID

## 2025-01-19 ENCOUNTER — Ambulatory Visit: Payer: MEDICAID
